# Patient Record
Sex: Female | Born: 1940
Health system: Southern US, Community
[De-identification: ages and names within clinical notes are randomized; demographics above are authoritative.]

## PROBLEM LIST (undated history)

## (undated) DIAGNOSIS — W19XXXA Unspecified fall, initial encounter: Secondary | ICD-10-CM

## (undated) DIAGNOSIS — S0990XA Unspecified injury of head, initial encounter: Secondary | ICD-10-CM

## (undated) DIAGNOSIS — B029 Zoster without complications: Secondary | ICD-10-CM

## (undated) DIAGNOSIS — R296 Repeated falls: Secondary | ICD-10-CM

## (undated) DIAGNOSIS — T148XXA Other injury of unspecified body region, initial encounter: Secondary | ICD-10-CM

## (undated) DIAGNOSIS — S060X9A Concussion with loss of consciousness of unspecified duration, initial encounter: Secondary | ICD-10-CM

## (undated) DIAGNOSIS — B009 Herpesviral infection, unspecified: Secondary | ICD-10-CM

## (undated) DIAGNOSIS — S060XAA Concussion with loss of consciousness status unknown, initial encounter: Secondary | ICD-10-CM

## (undated) DIAGNOSIS — T7840XA Allergy, unspecified, initial encounter: Secondary | ICD-10-CM

## (undated) DIAGNOSIS — G43909 Migraine, unspecified, not intractable, without status migrainosus: Secondary | ICD-10-CM

## (undated) DIAGNOSIS — I10 Essential (primary) hypertension: Secondary | ICD-10-CM

## (undated) HISTORY — DX: Concussion with loss of consciousness status unknown, initial encounter: S06.0XAA

## (undated) HISTORY — PX: CRANIOTOMY FOR EXTRACRANIAL-INTRACRANIAL BYPASS: SUR334

## (undated) HISTORY — DX: Allergy, unspecified, initial encounter: T78.40XA

## (undated) HISTORY — DX: Unspecified fall, initial encounter: W19.XXXA

## (undated) HISTORY — PX: BRAIN SURGERY: SHX531

## (undated) HISTORY — DX: Herpesviral infection, unspecified: B00.9

## (undated) HISTORY — PX: TONSILLECTOMY: SUR1361

## (undated) HISTORY — DX: Concussion with loss of consciousness of unspecified duration, initial encounter: S06.0X9A

## (undated) HISTORY — PX: NERVE SURGERY: SHX1016

## (undated) HISTORY — DX: Zoster without complications: B02.9

## (undated) HISTORY — PX: ROOT CANAL: SHX2363

## (undated) HISTORY — DX: Repeated falls: R29.6

## (undated) HISTORY — PX: HIP SURGERY: SHX245

---

## 2007-02-18 LAB — HM COLONOSCOPY: HM Colonoscopy: NORMAL

## 2010-02-17 LAB — HM DEXA SCAN: HM DEXA SCAN: NORMAL

## 2012-10-06 ENCOUNTER — Emergency Department (HOSPITAL_BASED_OUTPATIENT_CLINIC_OR_DEPARTMENT_OTHER): Payer: Medicare Other

## 2012-10-06 ENCOUNTER — Encounter (HOSPITAL_BASED_OUTPATIENT_CLINIC_OR_DEPARTMENT_OTHER): Payer: Self-pay

## 2012-10-06 ENCOUNTER — Emergency Department (HOSPITAL_BASED_OUTPATIENT_CLINIC_OR_DEPARTMENT_OTHER)
Admission: EM | Admit: 2012-10-06 | Discharge: 2012-10-06 | Disposition: A | Discharge status: Home / Self Care | Payer: Medicare Other | Attending: Emergency Medicine | Admitting: Emergency Medicine

## 2012-10-06 DIAGNOSIS — S0093XA Contusion of unspecified part of head, initial encounter: Secondary | ICD-10-CM

## 2012-10-06 DIAGNOSIS — Z8679 Personal history of other diseases of the circulatory system: Secondary | ICD-10-CM | POA: Insufficient documentation

## 2012-10-06 DIAGNOSIS — Y929 Unspecified place or not applicable: Secondary | ICD-10-CM | POA: Insufficient documentation

## 2012-10-06 DIAGNOSIS — Z79899 Other long term (current) drug therapy: Secondary | ICD-10-CM | POA: Insufficient documentation

## 2012-10-06 DIAGNOSIS — I10 Essential (primary) hypertension: Secondary | ICD-10-CM | POA: Insufficient documentation

## 2012-10-06 DIAGNOSIS — Y9389 Activity, other specified: Secondary | ICD-10-CM | POA: Insufficient documentation

## 2012-10-06 DIAGNOSIS — Z9889 Other specified postprocedural states: Secondary | ICD-10-CM | POA: Insufficient documentation

## 2012-10-06 DIAGNOSIS — S0003XA Contusion of scalp, initial encounter: Secondary | ICD-10-CM | POA: Insufficient documentation

## 2012-10-06 DIAGNOSIS — W1809XA Striking against other object with subsequent fall, initial encounter: Secondary | ICD-10-CM | POA: Insufficient documentation

## 2012-10-06 DIAGNOSIS — Z8669 Personal history of other diseases of the nervous system and sense organs: Secondary | ICD-10-CM | POA: Insufficient documentation

## 2012-10-06 HISTORY — DX: Other injury of unspecified body region, initial encounter: T14.8XXA

## 2012-10-06 HISTORY — DX: Migraine, unspecified, not intractable, without status migrainosus: G43.909

## 2012-10-06 HISTORY — DX: Unspecified injury of head, initial encounter: S09.90XA

## 2012-10-06 HISTORY — DX: Essential (primary) hypertension: I10

## 2012-10-06 NOTE — ED Provider Notes (Signed)
CSN: [id number]     Arrival date & time 10/06/12  1829 History     First MD Initiated Contact with Patient 10/06/12 1912     Chief Complaint  Patient presents with  . Fall  . Headache   (Consider location/radiation/quality/duration/timing/severity/associated sxs/prior Treatment) Patient is a 72 y.o. female presenting with fall. The history is provided by the patient. No language interpreter was used.  Fall This is a new problem. The current episode started today. The problem has been unchanged. Associated symptoms include headaches. Pertinent negatives include no abdominal pain, nausea, neck pain, vomiting or weakness. Nothing aggravates the symptoms. She has tried nothing for the symptoms. The treatment provided no relief.  Pt reports she was helping a friend who has Parkinson's and friend fell knocking her down.  Pt reports she hit the back of her head on concrete.  No loss of consciousness.   Pt reports pain to the back of her head.   Pt has had 2 previous brain surgery's for nerve damage.    Past Medical History  Diagnosis Date  . Head injury   . Nerve damage   . Hypertension   . Migraines    Past Surgical History  Procedure Laterality Date  . Brain surgery    . Nerve surgery    . Hip surgery    . Tonsillectomy     No family history on file. History  Substance Use Topics  . Smoking status: Never Smoker   . Smokeless tobacco: Not on file  . Alcohol Use: Yes     Comment: social   OB History   Grav Para Term Preterm Abortions TAB SAB Ect Mult Living                 Review of Systems  HENT: Negative for neck pain.   Gastrointestinal: Negative for nausea, vomiting and abdominal pain.  Neurological: Positive for headaches. Negative for weakness.  All other systems reviewed and are negative.    Allergies  Review of patient's allergies indicates not on file.  Home Medications   Current Outpatient Rx  Name  Route  Sig  Dispense  Refill  . Nebivolol HCl (BYSTOLIC  PO)   Oral   Take by mouth.         . Valsartan (DIOVAN PO)   Oral   Take by mouth.          BP 173/68  Pulse 55  Temp(Src) 97.5 F (36.4 C) (Oral)  Resp 16  Ht 5\' 3"  (1.6 m)  Wt 178 lb (80.74 kg)  BMI 31.54 kg/m2  SpO2 97% Physical Exam  Nursing note and vitals reviewed. Constitutional: She is oriented to person, place, and time. She appears well-developed and well-nourished.  HENT:  Head: Normocephalic.  Right Ear: External ear normal.  Left Ear: External ear normal.  Nose: Nose normal.  Mouth/Throat: Oropharynx is clear and moist.  Eyes: Conjunctivae and EOM are normal. Pupils are equal, round, and reactive to light.  Neck: Normal range of motion. Neck supple.  Cardiovascular: Normal rate.   Pulmonary/Chest: Effort normal.  Abdominal: Soft.  Musculoskeletal: Normal range of motion.  Neurological: She is alert and oriented to person, place, and time. She has normal reflexes.  Skin: Skin is warm.  Psychiatric: She has a normal mood and affect.    ED Course   Procedures (including critical care time)  Labs Reviewed - No data to display No results found. 1. Contusion of head, initial encounter  MDM  Ct no acute abnormality,   Dr. Rosalia Hammers in to see and examine  Elson Areas, PA-C 10/06/12 2145

## 2012-10-06 NOTE — ED Notes (Signed)
Patient transported to CT 

## 2012-10-06 NOTE — ED Notes (Signed)
Pt reports she was standing behind her friend, the friend lost her balance falling on pt.  Pt states she fell onto a hard surface striking head on floor.  Denies LOC or neck pain.  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

## 2012-10-06 NOTE — ED Notes (Signed)
Pt reports she fell striking head on floor.  Denies LOC.

## 2012-10-07 NOTE — ED Provider Notes (Signed)
72 y.o. Female who fellthis evening and struck her head.  She did not have an loc and is not on coumadin or plavix or other blood thinner.  She has a normal neuro exam.   I performed a history and physical examination of Angela Gibbs and discussed her management with [name].  I agree with the history, physical, assessment, and plan of care, with the following exceptions: None  I was present for the following procedures: None Time Spent in Critical Care of the patient: None Time spent in discussions with the patient and family: 7  Angela Gibbs S    Hilario Quarry, MD 10/07/12 1445

## 2013-04-16 ENCOUNTER — Encounter (HOSPITAL_BASED_OUTPATIENT_CLINIC_OR_DEPARTMENT_OTHER): Payer: Self-pay | Admitting: Emergency Medicine

## 2013-04-16 ENCOUNTER — Emergency Department (HOSPITAL_BASED_OUTPATIENT_CLINIC_OR_DEPARTMENT_OTHER)
Admission: EM | Admit: 2013-04-16 | Discharge: 2013-04-16 | Disposition: A | Discharge status: Home / Self Care | Payer: Medicare Other | Attending: Emergency Medicine | Admitting: Emergency Medicine

## 2013-04-16 ENCOUNTER — Emergency Department (HOSPITAL_BASED_OUTPATIENT_CLINIC_OR_DEPARTMENT_OTHER): Payer: Medicare Other

## 2013-04-16 DIAGNOSIS — S39012A Strain of muscle, fascia and tendon of lower back, initial encounter: Secondary | ICD-10-CM

## 2013-04-16 DIAGNOSIS — Y929 Unspecified place or not applicable: Secondary | ICD-10-CM | POA: Insufficient documentation

## 2013-04-16 DIAGNOSIS — I10 Essential (primary) hypertension: Secondary | ICD-10-CM | POA: Insufficient documentation

## 2013-04-16 DIAGNOSIS — S335XXA Sprain of ligaments of lumbar spine, initial encounter: Secondary | ICD-10-CM | POA: Insufficient documentation

## 2013-04-16 DIAGNOSIS — T148XXA Other injury of unspecified body region, initial encounter: Secondary | ICD-10-CM

## 2013-04-16 DIAGNOSIS — S20229A Contusion of unspecified back wall of thorax, initial encounter: Secondary | ICD-10-CM | POA: Insufficient documentation

## 2013-04-16 DIAGNOSIS — Z87828 Personal history of other (healed) physical injury and trauma: Secondary | ICD-10-CM | POA: Insufficient documentation

## 2013-04-16 DIAGNOSIS — G43909 Migraine, unspecified, not intractable, without status migrainosus: Secondary | ICD-10-CM | POA: Insufficient documentation

## 2013-04-16 DIAGNOSIS — W108XXA Fall (on) (from) other stairs and steps, initial encounter: Secondary | ICD-10-CM | POA: Insufficient documentation

## 2013-04-16 DIAGNOSIS — Z880 Allergy status to penicillin: Secondary | ICD-10-CM | POA: Insufficient documentation

## 2013-04-16 DIAGNOSIS — Y9389 Activity, other specified: Secondary | ICD-10-CM | POA: Insufficient documentation

## 2013-04-16 DIAGNOSIS — Z79899 Other long term (current) drug therapy: Secondary | ICD-10-CM | POA: Insufficient documentation

## 2013-04-16 MED ORDER — PROMETHAZINE HCL 25 MG PO TABS: 25.0000 mg | ORAL_TABLET | Freq: Four times a day (QID) | ORAL | Status: DC | PRN

## 2013-04-16 MED ORDER — DIAZEPAM 5 MG PO TABS: 5.0000 mg | ORAL_TABLET | Freq: Two times a day (BID) | ORAL | Status: DC | PRN

## 2013-04-16 MED ORDER — HYDROMORPHONE HCL PF 1 MG/ML IJ SOLN
0.5000 mg | Freq: Once | INTRAMUSCULAR | Status: AC
  Administered 2013-04-16: 0.5 mg via INTRAMUSCULAR
  Filled 2013-04-16: qty 1

## 2013-04-16 MED ORDER — OXYCODONE-ACETAMINOPHEN 5-325 MG PO TABS: 1.0000 | ORAL_TABLET | ORAL | Status: DC | PRN

## 2013-04-16 MED ORDER — ONDANSETRON 8 MG PO TBDP
8.0000 mg | ORAL_TABLET | Freq: Once | ORAL | Status: AC
  Administered 2013-04-16: 8 mg via ORAL
  Filled 2013-04-16: qty 1

## 2013-04-16 NOTE — ED Provider Notes (Signed)
CSN: [id number]     Arrival date & time 04/16/13  1244 History   First MD Initiated Contact with Patient 04/16/13 1247     Chief Complaint  Patient presents with  . Back Pain     (Consider location/radiation/quality/duration/timing/severity/associated sxs/prior Treatment) HPI Comments: Patient presents to the ER for evaluation of back pain. Patient reports that she fell walking down a flight of steps 2 days ago. Patient reports that she slid on her back, hitting multiple steps on the way down. No head injury or loss of consciousness. Patient has had progressively worsening pain on the left side of her mid and low back since the fall. She denies extremity injury.  Patient is a 73 y.o. female presenting with back pain.  Back Pain   Past Medical History  Diagnosis Date  . Head injury   . Nerve damage   . Hypertension   . Migraines    Past Surgical History  Procedure Laterality Date  . Brain surgery    . Nerve surgery    . Hip surgery      intracranial surgery  . Tonsillectomy    . Craniotomy for extracranial-intracranial bypass     No family history on file. History  Substance Use Topics  . Smoking status: Never Smoker   . Smokeless tobacco: Not on file  . Alcohol Use: Yes     Comment: social   OB History   Grav Para Term Preterm Abortions TAB SAB Ect Mult Living                 Review of Systems  Musculoskeletal: Positive for back pain.  All other systems reviewed and are negative.      Allergies  Penicillins  Home Medications   Current Outpatient Rx  Name  Route  Sig  Dispense  Refill  . butalbital-acetaminophen-caffeine (FIORICET, ESGIC) 50-325-40 MG per tablet   Oral   Take by mouth 2 (two) times daily as needed for headache.         . Nebivolol HCl (BYSTOLIC PO)   Oral   Take by mouth.         . nystatin 100000 UNITS vaginal tablet   Vaginal   Place 1 tablet vaginally at bedtime.         . Valsartan (DIOVAN PO)   Oral   Take by  mouth.          BP 156/67  Pulse 55  Temp(Src) 97.7 F (36.5 C) (Oral)  Resp 16  Ht 5\' 3"  (1.6 m)  Wt 180 lb (81.647 kg)  BMI 31.89 kg/m2  SpO2 99% Physical Exam  Constitutional: She is oriented to person, place, and time. She appears well-developed and well-nourished. No distress.  HENT:  Head: Normocephalic and atraumatic.  Right Ear: Hearing normal.  Left Ear: Hearing normal.  Nose: Nose normal.  Mouth/Throat: Oropharynx is clear and moist and mucous membranes are normal.  Eyes: Conjunctivae and EOM are normal. Pupils are equal, round, and reactive to light.  Neck: Normal range of motion. Neck supple.  Cardiovascular: Regular rhythm, S1 normal and S2 normal.  Exam reveals no gallop and no friction rub.   No murmur heard. Pulmonary/Chest: Effort normal and breath sounds normal. No respiratory distress. She exhibits no tenderness.  Abdominal: Soft. Normal appearance and bowel sounds are normal. There is no hepatosplenomegaly. There is no tenderness. There is no rebound, no guarding, no tenderness at McBurney's point and negative Murphy's sign. No hernia.  Musculoskeletal: Normal range  of motion.       Lumbar back: She exhibits tenderness. She exhibits no bony tenderness.       Back:  Neurological: She is alert and oriented to person, place, and time. She has normal strength. No cranial nerve deficit or sensory deficit. Coordination normal. GCS eye subscore is 4. GCS verbal subscore is 5. GCS motor subscore is 6.  Skin: Skin is warm, dry and intact. No rash noted. No cyanosis.  Psychiatric: She has a normal mood and affect. Her speech is normal and behavior is normal. Thought content normal.    ED Course  Procedures (including critical care time) Labs Review Labs Reviewed - No data to display Imaging Review Dg Ribs Unilateral W/chest Left  04/16/2013   CLINICAL DATA:  Pain post fall  EXAM: LEFT RIBS AND CHEST - 3+ VIEW  COMPARISON:  02/04/2011  FINDINGS: Lungs are clear.  Heart size upper limits normal.  No effusion. Thoracolumbar scoliosis, apex to right T8-9, to the left L1-2, without evident underlying vertebral anomaly. No pneumothorax. No displaced rib fracture on detail views.  IMPRESSION: 1. Negative for displaced rib fracture, pneumothorax or pleural effusion. 2. Thoracolumbar scoliosis.   Electronically Signed   By: Arne Cleveland M.D.   On: 04/16/2013 13:53   Dg Lumbar Spine Complete  04/16/2013   CLINICAL DATA:  Pain post fall  EXAM: LUMBAR SPINE - COMPLETE 4+ VIEW  COMPARISON:  None.  FINDINGS: Thoracolumbar scoliosis, apex to the left at L2. Spondylitic changes L1- S1. No definite fracture or dislocation. Right hip prosthesis partially visualized.  IMPRESSION: 1. Negative for fracture or other acute bone abnormality. 2. Thoracolumbar scoliosis with multilevel degenerative changes.   Electronically Signed   By: Arne Cleveland M.D.   On: 04/16/2013 13:55   Dg Pelvis 1-2 Views  04/16/2013   CLINICAL DATA:  Pain post fall  EXAM: PELVIS - 1-2 VIEW  COMPARISON:  None.  FINDINGS: Right hip arthroplasty components, and distal aspect of stem not visualized. No fracture or dislocation. Spurring from the superior margin left acetabulum. Lumbar levoscoliosis with multilevel degenerative changes. Osteitis pubis.  IMPRESSION: 1. Negative for fracture or other acute bone abnormality. 2. Postoperative and degenerative changes as above.   Electronically Signed   By: Arne Cleveland M.D.   On: 04/16/2013 13:56     EKG Interpretation None      MDM   Final diagnoses:  None    Patient presents with complaints of back pain. Pain is on the left side of the lower back. Tenderness is from the posterior iliac crest to the lower posterior ribs, encompassing most of the soft tissue and paraspinal muscles on the left side. No specific midline tenderness. X-ray of the ribs, lumbar spine and pelvis were obtained because of the areas of tenderness. No acute bony abnormalities are  seen. Patient will require rest and analgesia.    Orpah Greek, MD 04/16/13 [phone]

## 2013-04-16 NOTE — Discharge Instructions (Signed)
Back Pain, Adult Low back pain is very common. About 1 in 5 people have back pain.The cause of low back pain is rarely dangerous. The pain often gets better over time.About half of people with a sudden onset of back pain feel better in just 2 weeks. About 8 in 10 people feel better by 6 weeks.  CAUSES Some common causes of back pain include:  Strain of the muscles or ligaments supporting the spine.  Wear and tear (degeneration) of the spinal discs.  Arthritis.  Direct injury to the back. DIAGNOSIS Most of the time, the direct cause of low back pain is not known.However, back pain can be treated effectively even when the exact cause of the pain is unknown.Answering your caregiver's questions about your overall health and symptoms is one of the most accurate ways to make sure the cause of your pain is not dangerous. If your caregiver needs more information, he or she may order lab work or imaging tests (X-rays or MRIs).However, even if imaging tests show changes in your back, this usually does not require surgery. HOME CARE INSTRUCTIONS For many people, back pain returns.Since low back pain is rarely dangerous, it is often a condition that people can learn to manageon their own.   Remain active. It is stressful on the back to sit or stand in one place. Do not sit, drive, or stand in one place for more than 30 minutes at a time. Take short walks on level surfaces as soon as pain allows.Try to increase the length of time you walk each day.  Do not stay in bed.Resting more than 1 or 2 days can delay your recovery.  Do not avoid exercise or work.Your body is made to move.It is not dangerous to be active, even though your back may hurt.Your back will likely heal faster if you return to being active before your pain is gone.  Pay attention to your body when you bend and lift. Many people have less discomfortwhen lifting if they bend their knees, keep the load close to their bodies,and  avoid twisting. Often, the most comfortable positions are those that put less stress on your recovering back.  Find a comfortable position to sleep. Use a firm mattress and lie on your side with your knees slightly bent. If you lie on your back, put a pillow under your knees.  Only take over-the-counter or prescription medicines as directed by your caregiver. Over-the-counter medicines to reduce pain and inflammation are often the most helpful.Your caregiver may prescribe muscle relaxant drugs.These medicines help dull your pain so you can more quickly return to your normal activities and healthy exercise.  Put ice on the injured area.  Put ice in a plastic bag.  Place a towel between your skin and the bag.  Leave the ice on for 15-20 minutes, 03-04 times a day for the first 2 to 3 days. After that, ice and heat may be alternated to reduce pain and spasms.  Ask your caregiver about trying back exercises and gentle massage. This may be of some benefit.  Avoid feeling anxious or stressed.Stress increases muscle tension and can worsen back pain.It is important to recognize when you are anxious or stressed and learn ways to manage it.Exercise is a great option. SEEK MEDICAL CARE IF:  You have pain that is not relieved with rest or medicine.  You have pain that does not improve in 1 week.  You have new symptoms.  You are generally not feeling well. SEEK   IMMEDIATE MEDICAL CARE IF:   You have pain that radiates from your back into your legs.  You develop new bowel or bladder control problems.  You have unusual weakness or numbness in your arms or legs.  You develop nausea or vomiting.  You develop abdominal pain.  You feel faint. Document Released: 02/03/2005 Document Revised: 08/05/2011 Document Reviewed: 06/24/2010 [employer] Patient Information 2014 [employer].  

## 2013-04-16 NOTE — ED Notes (Signed)
Fall down steps 48 hrs ago.  C/o lower left back pain since fall

## 2013-04-16 NOTE — ED Notes (Signed)
MD at bedside. 

## 2013-04-28 ENCOUNTER — Ambulatory Visit: Payer: Medicare Other | Attending: Orthopaedic Surgery | Admitting: Physical Therapy

## 2013-04-28 DIAGNOSIS — M545 Low back pain, unspecified: Secondary | ICD-10-CM | POA: Insufficient documentation

## 2013-04-28 DIAGNOSIS — IMO0001 Reserved for inherently not codable concepts without codable children: Secondary | ICD-10-CM | POA: Insufficient documentation

## 2013-05-02 ENCOUNTER — Ambulatory Visit: Payer: Medicare Other | Admitting: Rehabilitation

## 2013-05-04 ENCOUNTER — Ambulatory Visit: Payer: Medicare Other | Admitting: Rehabilitation

## 2013-05-09 ENCOUNTER — Ambulatory Visit: Payer: Medicare Other | Admitting: Physical Therapy

## 2013-05-12 ENCOUNTER — Ambulatory Visit: Payer: Medicare Other | Admitting: Physical Therapy

## 2013-05-16 ENCOUNTER — Ambulatory Visit: Payer: Medicare Other | Admitting: Physical Therapy

## 2013-05-17 ENCOUNTER — Ambulatory Visit: Payer: Medicare Other | Admitting: Physical Therapy

## 2013-05-26 ENCOUNTER — Ambulatory Visit: Payer: Medicare Other | Admitting: Physical Therapy

## 2013-05-30 ENCOUNTER — Ambulatory Visit: Payer: Medicare Other | Attending: Orthopaedic Surgery | Admitting: Rehabilitation

## 2013-05-30 DIAGNOSIS — M545 Low back pain, unspecified: Secondary | ICD-10-CM | POA: Insufficient documentation

## 2013-05-30 DIAGNOSIS — IMO0001 Reserved for inherently not codable concepts without codable children: Secondary | ICD-10-CM | POA: Insufficient documentation

## 2013-06-01 ENCOUNTER — Ambulatory Visit: Payer: Medicare Other | Admitting: Physical Therapy

## 2013-06-06 DIAGNOSIS — H269 Unspecified cataract: Secondary | ICD-10-CM

## 2013-06-06 DIAGNOSIS — H029 Unspecified disorder of eyelid: Secondary | ICD-10-CM | POA: Insufficient documentation

## 2013-06-06 HISTORY — DX: Unspecified disorder of eyelid: H02.9

## 2013-06-06 HISTORY — DX: Unspecified cataract: H26.9

## 2013-06-08 ENCOUNTER — Ambulatory Visit: Payer: Medicare Other | Admitting: Rehabilitation

## 2013-06-13 ENCOUNTER — Encounter (HOSPITAL_BASED_OUTPATIENT_CLINIC_OR_DEPARTMENT_OTHER): Payer: Self-pay | Admitting: Emergency Medicine

## 2013-06-13 ENCOUNTER — Emergency Department (HOSPITAL_BASED_OUTPATIENT_CLINIC_OR_DEPARTMENT_OTHER)
Admission: EM | Admit: 2013-06-13 | Discharge: 2013-06-13 | Disposition: A | Discharge status: Home / Self Care | Payer: Medicare Other | Attending: Emergency Medicine | Admitting: Emergency Medicine

## 2013-06-13 DIAGNOSIS — G43909 Migraine, unspecified, not intractable, without status migrainosus: Secondary | ICD-10-CM | POA: Insufficient documentation

## 2013-06-13 DIAGNOSIS — Z79899 Other long term (current) drug therapy: Secondary | ICD-10-CM | POA: Insufficient documentation

## 2013-06-13 DIAGNOSIS — Z880 Allergy status to penicillin: Secondary | ICD-10-CM | POA: Insufficient documentation

## 2013-06-13 DIAGNOSIS — Z87828 Personal history of other (healed) physical injury and trauma: Secondary | ICD-10-CM | POA: Insufficient documentation

## 2013-06-13 DIAGNOSIS — N390 Urinary tract infection, site not specified: Secondary | ICD-10-CM | POA: Insufficient documentation

## 2013-06-13 DIAGNOSIS — I10 Essential (primary) hypertension: Secondary | ICD-10-CM | POA: Insufficient documentation

## 2013-06-13 LAB — URINE MICROSCOPIC-ADD ON

## 2013-06-13 LAB — URINALYSIS, ROUTINE W REFLEX MICROSCOPIC
BILIRUBIN URINE: NEGATIVE
GLUCOSE, UA: NEGATIVE mg/dL
KETONES UR: NEGATIVE mg/dL
Nitrite: POSITIVE — AB
PH: 6 (ref 5.0–8.0)
PROTEIN: NEGATIVE mg/dL
Specific Gravity, Urine: 1.017 (ref 1.005–1.030)
Urobilinogen, UA: 0.2 mg/dL (ref 0.0–1.0)

## 2013-06-13 MED ORDER — CEPHALEXIN 500 MG PO CAPS: 500.0000 mg | ORAL_CAPSULE | Freq: Four times a day (QID) | ORAL | Status: DC

## 2013-06-13 MED ORDER — PHENAZOPYRIDINE HCL 200 MG PO TABS: 200.0000 mg | ORAL_TABLET | Freq: Three times a day (TID) | ORAL | Status: DC | PRN

## 2013-06-13 NOTE — ED Notes (Signed)
Possible UTI. Urinary frequency and cloudiness.

## 2013-06-13 NOTE — ED Provider Notes (Signed)
CSN: [id number]     Arrival date & time 06/13/13  1715 History  This chart was scribed for Orpah Greek, MD by Celesta Gentile, ED Scribe. The patient was seen in room MHT13/MHT13. Patient's care was started at 6:37 PM.  Chief Complaint  Patient presents with  . Urinary Tract Infection   The history is provided by the patient. No language interpreter was used.   HPI Comments: Angela Gibbs is a 73 y.o. female who presents to the Emergency Department complaining of a urinary tract infection.  Pt has associated dysuria, urinary frequency, and cloudy urine.  Pt states when she urinates it feels very heavy and uncomfortable.  Pt states her last UTI was about 1 year ago.  Pt denies fevers, chills, nausea, and emesis.    Past Medical History  Diagnosis Date  . Head injury   . Nerve damage   . Hypertension   . Migraines    Past Surgical History  Procedure Laterality Date  . Brain surgery    . Nerve surgery    . Hip surgery      intracranial surgery  . Tonsillectomy    . Craniotomy for extracranial-intracranial bypass     No family history on file. History  Substance Use Topics  . Smoking status: Never Smoker   . Smokeless tobacco: Not on file  . Alcohol Use: Yes     Comment: social   OB History   Grav Para Term Preterm Abortions TAB SAB Ect Mult Living                 Review of Systems  Constitutional: Negative for fever and chills.  Gastrointestinal: Negative for nausea, vomiting, abdominal pain and diarrhea.  Genitourinary: Positive for dysuria and frequency. Negative for flank pain and decreased urine volume.  Skin: Negative for color change and rash.  Psychiatric/Behavioral: Negative for behavioral problems and confusion.  All other systems reviewed and are negative.   Allergies  Penicillins  Home Medications   Prior to Admission medications   Medication Sig Start Date End Date Taking? Authorizing Provider  butalbital-acetaminophen-caffeine (FIORICET,  ESGIC) 50-325-40 MG per tablet Take by mouth 2 (two) times daily as needed for headache.    Historical Provider, MD  diazepam (VALIUM) 5 MG tablet Take 1 tablet (5 mg total) by mouth every 12 (twelve) hours as needed for muscle spasms. 04/16/13   Orpah Greek, MD  Nebivolol HCl (BYSTOLIC PO) Take by mouth.    Historical Provider, MD  nystatin 100000 UNITS vaginal tablet Place 1 tablet vaginally at bedtime.    Historical Provider, MD  oxyCODONE-acetaminophen (PERCOCET) 5-325 MG per tablet Take 1 tablet by mouth every 4 (four) hours as needed. 04/16/13   Orpah Greek, MD  promethazine (PHENERGAN) 25 MG tablet Take 1 tablet (25 mg total) by mouth every 6 (six) hours as needed for nausea or vomiting. 04/16/13   Orpah Greek, MD  Valsartan (DIOVAN PO) Take by mouth.    Historical Provider, MD   Triage Vitals: BP 161/61  Pulse 58  Temp(Src) 98.9 F (37.2 C) (Oral)  Resp 18  Ht 5' 3.5" (1.613 m)  Wt 185 lb (83.915 kg)  BMI 32.25 kg/m2  SpO2 100%  Physical Exam  Nursing note and vitals reviewed. Constitutional: She is oriented to person, place, and time. She appears well-developed and well-nourished. No distress.  HENT:  Head: Normocephalic and atraumatic.  Eyes: EOM are normal.  Cardiovascular: Normal rate, regular rhythm and normal heart sounds.  Exam reveals no gallop and no friction rub.   No murmur heard. Pulmonary/Chest: Effort normal and breath sounds normal. No respiratory distress. She has no wheezes. She has no rales.  Abdominal: Soft. She exhibits no distension. There is no tenderness.  Musculoskeletal: Normal range of motion.  Neurological: She is alert and oriented to person, place, and time.  Skin: Skin is warm and dry. No rash noted.  Psychiatric: She has a normal mood and affect. Her behavior is normal.    ED Course  Procedures (including critical care time) DIAGNOSTIC STUDIES: Oxygen Saturation is 100% on RA, normal by my interpretation.     COORDINATION OF CARE: 6:39 PM-Informed pt her urine showed a UTI.  Will discharge with Keflex and Pyridium.   Patient informed of current plan of treatment and evaluation and agrees with plan.     Results for orders placed during the hospital encounter of 06/13/13  URINALYSIS, ROUTINE W REFLEX MICROSCOPIC      Result Value Ref Range   Color, Urine YELLOW  YELLOW   APPearance CLOUDY (*) CLEAR   Specific Gravity, Urine 1.017  1.005 - 1.030   pH 6.0  5.0 - 8.0   Glucose, UA NEGATIVE  NEGATIVE mg/dL   Hgb urine dipstick SMALL (*) NEGATIVE   Bilirubin Urine NEGATIVE  NEGATIVE   Ketones, ur NEGATIVE  NEGATIVE mg/dL   Protein, ur NEGATIVE  NEGATIVE mg/dL   Urobilinogen, UA 0.2  0.0 - 1.0 mg/dL   Nitrite POSITIVE (*) NEGATIVE   Leukocytes, UA LARGE (*) NEGATIVE  URINE MICROSCOPIC-ADD ON      Result Value Ref Range   Squamous Epithelial / LPF RARE  RARE   WBC, UA TOO NUMEROUS TO COUNT  <3 WBC/hpf   RBC / HPF 3-6  <3 RBC/hpf   Bacteria, UA MANY (*) RARE   Urine-Other MUCOUS PRESENT       MDM   Final diagnoses:  UTI (lower urinary tract infection)   Presents to ER for evaluation of urinary frequency and dysuria. Urinalysis shows obvious signs of urinary tract infection. She appears well, vital signs are stable. No concern for bacteremia or pyelonephritis. Initiate outpatient therapy, followup with primary doctor.  I personally performed the services described in this documentation, which was scribed in my presence. The recorded information has been reviewed and is accurate.       Orpah Greek, MD 06/13/13 [phone]

## 2013-06-13 NOTE — Discharge Instructions (Signed)
Urinary Tract Infection  Urinary tract infections (UTIs) can develop anywhere along your urinary tract. Your urinary tract is your body's drainage system for removing wastes and extra water. Your urinary tract includes two kidneys, two ureters, a bladder, and a urethra. Your kidneys are a pair of bean-shaped organs. Each kidney is about the size of your fist. They are located below your ribs, one on each side of your spine.  CAUSES  Infections are caused by microbes, which are microscopic organisms, including fungi, viruses, and bacteria. These organisms are so small that they can only be seen through a microscope. Bacteria are the microbes that most commonly cause UTIs.  SYMPTOMS   Symptoms of UTIs may vary by age and gender of the patient and by the location of the infection. Symptoms in young women typically include a frequent and intense urge to urinate and a painful, burning feeling in the bladder or urethra during urination. Older women and men are more likely to be tired, shaky, and weak and have muscle aches and abdominal pain. A fever may mean the infection is in your kidneys. Other symptoms of a kidney infection include pain in your back or sides below the ribs, nausea, and vomiting.  DIAGNOSIS  To diagnose a UTI, your caregiver will ask you about your symptoms. Your caregiver also will ask to provide a urine sample. The urine sample will be tested for bacteria and Billiot blood cells. Mirelez blood cells are made by your body to help fight infection.  TREATMENT   Typically, UTIs can be treated with medication. Because most UTIs are caused by a bacterial infection, they usually can be treated with the use of antibiotics. The choice of antibiotic and length of treatment depend on your symptoms and the type of bacteria causing your infection.  HOME CARE INSTRUCTIONS   If you were prescribed antibiotics, take them exactly as your caregiver instructs you. Finish the medication even if you feel better after you  have only taken some of the medication.   Drink enough water and fluids to keep your urine clear or pale yellow.   Avoid caffeine, tea, and carbonated beverages. They tend to irritate your bladder.   Empty your bladder often. Avoid holding urine for long periods of time.   Empty your bladder before and after sexual intercourse.   After a bowel movement, women should cleanse from front to back. Use each tissue only once.  SEEK MEDICAL CARE IF:    You have back pain.   You develop a fever.   Your symptoms do not begin to resolve within 3 days.  SEEK IMMEDIATE MEDICAL CARE IF:    You have severe back pain or lower abdominal pain.   You develop chills.   You have nausea or vomiting.   You have continued burning or discomfort with urination.  MAKE SURE YOU:    Understand these instructions.   Will watch your condition.   Will get help right away if you are not doing well or get worse.  Document Released: 11/13/2004 Document Revised: 08/05/2011 Document Reviewed: 03/14/2011  [employer] Patient Information 2014 [employer].

## 2013-06-15 ENCOUNTER — Telehealth (HOSPITAL_BASED_OUTPATIENT_CLINIC_OR_DEPARTMENT_OTHER): Payer: Self-pay

## 2013-06-15 ENCOUNTER — Ambulatory Visit: Payer: Medicare Other | Admitting: Rehabilitation

## 2013-07-05 ENCOUNTER — Ambulatory Visit: Payer: Medicare Other | Attending: Orthopaedic Surgery | Admitting: Rehabilitation

## 2013-07-05 DIAGNOSIS — IMO0001 Reserved for inherently not codable concepts without codable children: Secondary | ICD-10-CM | POA: Insufficient documentation

## 2013-07-05 DIAGNOSIS — M545 Low back pain, unspecified: Secondary | ICD-10-CM | POA: Insufficient documentation

## 2013-07-06 ENCOUNTER — Ambulatory Visit: Payer: Medicare Other | Admitting: Rehabilitation

## 2013-07-13 ENCOUNTER — Ambulatory Visit: Payer: Medicare Other | Admitting: Rehabilitation

## 2013-07-14 ENCOUNTER — Ambulatory Visit: Payer: Medicare Other | Admitting: Rehabilitation

## 2013-07-18 ENCOUNTER — Ambulatory Visit: Payer: Medicare Other | Attending: Orthopaedic Surgery | Admitting: Rehabilitation

## 2013-07-18 DIAGNOSIS — IMO0001 Reserved for inherently not codable concepts without codable children: Secondary | ICD-10-CM | POA: Insufficient documentation

## 2013-07-18 DIAGNOSIS — M545 Low back pain, unspecified: Secondary | ICD-10-CM | POA: Insufficient documentation

## 2013-07-21 ENCOUNTER — Ambulatory Visit: Payer: Medicare Other | Admitting: Rehabilitation

## 2013-07-25 ENCOUNTER — Ambulatory Visit: Payer: Medicare Other | Admitting: Rehabilitation

## 2013-07-27 ENCOUNTER — Telehealth (INDEPENDENT_AMBULATORY_CARE_PROVIDER_SITE_OTHER): Payer: Self-pay

## 2013-07-27 NOTE — Telephone Encounter (Signed)
LMOM. Received a letter from pt re: upcoming appt and records. We cannot send a request for records that has not been signed by the pt. We have not seen the pt yet and are not able to request outside records without a signed release.  If the patient wants [place] to request records she will have to come to office and sign a release or she will have to call the hospital and ask them to fax her a release to be signed an faxed back to them.

## 2013-07-27 NOTE — Telephone Encounter (Signed)
Pt calling to report that she tried to get the records from the hospital in [facility], but was unsuccessful.  She will keep her appointment with [place] next week.  An attorney has been retained and will be getting the records for the patient.

## 2013-07-28 ENCOUNTER — Ambulatory Visit: Payer: Medicare Other | Admitting: Rehabilitation

## 2013-07-29 ENCOUNTER — Ambulatory Visit (INDEPENDENT_AMBULATORY_CARE_PROVIDER_SITE_OTHER): Payer: Medicare Other | Admitting: Surgery

## 2013-07-29 ENCOUNTER — Encounter (INDEPENDENT_AMBULATORY_CARE_PROVIDER_SITE_OTHER): Payer: Self-pay | Admitting: Surgery

## 2013-07-29 VITALS — BP 136/78 | HR 74 | Temp 97.8°F | Ht 63.0 in | Wt 181.0 lb

## 2013-07-29 DIAGNOSIS — R1011 Right upper quadrant pain: Secondary | ICD-10-CM | POA: Insufficient documentation

## 2013-07-29 DIAGNOSIS — G8929 Other chronic pain: Secondary | ICD-10-CM

## 2013-07-29 HISTORY — DX: Right upper quadrant pain: R10.11

## 2013-07-29 NOTE — Progress Notes (Signed)
[facility]  Chief Complaint  Patient presents with  . New Evaluation    evaluate for gallbladder disease - patient is self-referred    HISTORY: Patient is a 73 year old female who presents for evaluation of right upper quadrant abdominal pain radiating to the back. Patient had an episode of left-sided chest pain approximately one year ago. She was evaluated for cardiac disease and her workup was essentially negative. This included an abdominal ultrasound on 08/14/2012 which was a normal study with no evidence of cholelithiasis.  Patient developed right upper quadrant abdominal pain on 07/19/2013. Patient describes this as severe and radiating to the back between the scapulae. It was associated with nausea and emesis. Patient has experienced chills and may have had fever for 2 days. She was seen by her primary care physician and underwent abdominal ultrasound and laboratory studies. Pain persisted until 07/27/2013. She has been pain-free now for 3 days. Patient is referred for further evaluation and recommendations.  Ultrasound examination on 07/21/2013 at [facility] shows cholelithiasis without evidence of acute cholecystitis.  Patient has had no prior abdominal surgery.  Past Medical History  Diagnosis Date  . Head injury   . Nerve damage   . Hypertension   . Migraines     Current Outpatient Prescriptions  Medication Sig Dispense Refill  . butalbital-acetaminophen-caffeine (FIORICET, ESGIC) 50-325-40 MG per tablet Take by mouth 2 (two) times daily as needed for headache.      . cephALEXin (KEFLEX) 500 MG capsule Take 1 capsule (500 mg total) by mouth 4 (four) times daily.  28 capsule  0  . Cholecalciferol (D 5000) 5000 UNITS TABS daily at 0600.      . diazepam (VALIUM) 5 MG tablet Take 1 tablet (5 mg total) by mouth every 12 (twelve) hours as needed for muscle spasms.  10 tablet  0  . Nebivolol HCl (BYSTOLIC PO) Take by mouth.      .  nystatin 100000 UNITS vaginal tablet Place 1 tablet vaginally at bedtime.      Marland Kitchen omeprazole (PRILOSEC OTC) 20 MG tablet 2 mg.      . phenazopyridine (PYRIDIUM) 200 MG tablet Take 1 tablet (200 mg total) by mouth 3 (three) times daily as needed for pain.  6 tablet  0  . promethazine (PHENERGAN) 25 MG tablet Take 1 tablet (25 mg total) by mouth every 6 (six) hours as needed for nausea or vomiting.  30 tablet  0  . Valsartan (DIOVAN PO) Take by mouth.      . valsartan (DIOVAN) 80 MG tablet        No current facility-administered medications for this visit.    Allergies  Allergen Reactions  . Erythromycin Hives  . Penicillins   . Sulfa Antibiotics Hives    Family History  Problem Relation Age of Onset  . Heart disease Father     History   Social History  . Marital Status: Married    Spouse Name: N/A    Number of Children: N/A  . Years of Education: N/A   Social History Main Topics  . Smoking status: Never Smoker   . Smokeless tobacco: None  . Alcohol Use: Yes     Comment: social  . Drug Use: No  . Sexual Activity: None   Other Topics Concern  . None   Social History Narrative  . None    REVIEW OF SYSTEMS - PERTINENT POSITIVES ONLY: Denies jaundice. Loose bowel movements, no acholic stools. No  hepatobiliary or pancreatic disease.  EXAM: Filed Vitals:   07/29/13 1425  BP: 136/78  Pulse: 74  Temp: 97.8 F (36.6 C)    GENERAL: well-developed, well-nourished, no acute distress HEENT: normocephalic; pupils equal and reactive; sclerae clear; dentition good; mucous membranes moist NECK:  No palpable masses in the thyroid bed; symmetric on extension; no palpable anterior or posterior cervical lymphadenopathy; no supraclavicular masses; no tenderness CHEST: clear to auscultation bilaterally without rales, rhonchi, or wheezes CARDIAC: regular rate and rhythm without significant murmur; peripheral pulses are full ABDOMEN: soft without distension; bowel sounds present; no  mass; no hepatosplenomegaly; no hernia; no tenderness EXT:  non-tender without edema; no deformity NEURO: no gross focal deficits; no sign of tremor   LABORATORY RESULTS: See [facility] ([facility]) for most recent results  RADIOLOGY RESULTS: See [facility] ([facility]L-Epic) for most recent results  IMPRESSION: Symptomatic cholelithiasis, chronic cholecystitis  PLAN: I discussed the above findings at length with the patient and her husband. We reviewed her ultrasound report. We reviewed the actual images and I returned those images to the patient.  I have recommended laparoscopic cholecystectomy with intraoperative cholangiography. We discussed the risk and benefits of the procedure including the risk of bleeding, infection, need for conversion to open surgery, and injury to common bile duct. We discussed the hospital stay to be anticipated and her postoperative recovery. They understand and agree to proceed in the near future.  The risks and benefits of the procedure have been discussed at length with the patient.  The patient understands the proposed procedure, potential alternative treatments, and the course of recovery to be expected.  All of the patient's questions have been answered at this time.  The patient wishes to proceed with surgery.  Earnstine Regal, MD, [facility]  Primary Care Physician: Idamae Schuller, MD

## 2013-07-29 NOTE — Patient Instructions (Signed)
  [facility]  LAPAROSCOPIC SURGERY - POST-OP INSTRUCTIONS  Always review your discharge instruction sheet given to you by the facility where your surgery was performed.  A prescription for pain medication may be given to you upon discharge.  Take your pain medication as prescribed.  If narcotic pain medicine is not needed, then you may take acetaminophen (Tylenol) or ibuprofen (Advil) as needed.  Take your usually prescribed medications unless otherwise directed.  If you need a refill on your pain medication, please contact your pharmacy.  They will contact our office to request authorization. Prescriptions will not be filled after 5 P.M. or on weekends.  You should follow a light diet the first few days after arrival home, such as soup and crackers or toast.  Be sure to include plenty of fluids daily.  Most patients will experience some swelling and bruising in the area of the incisions.  Ice packs will help.  Swelling and bruising can take several days to resolve.   It is common to experience some constipation if taking pain medication after surgery.  Increasing fluid intake and taking a stool softener (such as Colace) will usually help or prevent this problem from occurring.  A mild laxative (Milk of Magnesia or Miralax) should be taken according to package instructions if there are no bowel movements after 48 hours.  Unless discharge instructions indicate otherwise, you may remove your bandages 24-48 hours after surgery, and you may shower at that time.  You may have steri-strips (small skin tapes) in place directly over the incision.  These strips should be left on the skin for 7-10 days.  If your surgeon used skin glue on the incision, you may shower in 24 hours.  The glue will flake off over the next 2-3 weeks.  Any sutures or staples will be removed at the office during your follow-up visit.  ACTIVITIES:  You may resume regular (light) daily activities beginning the  next day-such as daily self-care, walking, climbing stairs-gradually increasing activities as tolerated.  You may have sexual intercourse when it is comfortable.  Refrain from any heavy lifting or straining until approved by your doctor.  You may drive when you are no longer taking prescription pain medication, you can comfortably wear a seatbelt, and you can safely maneuver your car and apply brakes.  You should see your doctor in the office for a follow-up appointment approximately 2-3 weeks after your surgery.  Make sure that you call for this appointment within a day or two after you arrive home to insure a convenient appointment time.  WHEN TO CALL YOUR DOCTOR: 1. Fever over 101.0 2. Inability to urinate 3. Continued bleeding from incision 4. Increased pain, redness, or drainage from the incision 5. Increasing abdominal pain  The clinic staff is available to answer your questions during regular business hours.  Please don't hesitate to call and ask to speak to one of the nurses for clinical concerns.  If you have a medical emergency, go to the nearest emergency room or call 911.  A surgeon from [facility] is always on call for the hospital.  Juandavid Dallman M. Anaise Sterbenz, MD, FACS [facility] Office: [phone] Toll Free:  [phone] FAX [phone]  Web site: [email or web address] 

## 2013-08-03 ENCOUNTER — Encounter (INDEPENDENT_AMBULATORY_CARE_PROVIDER_SITE_OTHER): Payer: Self-pay

## 2013-08-08 ENCOUNTER — Ambulatory Visit (INDEPENDENT_AMBULATORY_CARE_PROVIDER_SITE_OTHER): Payer: PRIVATE HEALTH INSURANCE | Admitting: General Surgery

## 2013-08-16 ENCOUNTER — Encounter (HOSPITAL_COMMUNITY): Admission: RE | Payer: Self-pay | Source: Ambulatory Visit

## 2013-08-16 ENCOUNTER — Ambulatory Visit (HOSPITAL_COMMUNITY): Admission: RE | Admit: 2013-08-16 | Payer: Medicare Other | Source: Ambulatory Visit | Admitting: Surgery

## 2013-08-16 SURGERY — LAPAROSCOPIC CHOLECYSTECTOMY WITH INTRAOPERATIVE CHOLANGIOGRAM
Anesthesia: General

## 2013-08-30 ENCOUNTER — Encounter (INDEPENDENT_AMBULATORY_CARE_PROVIDER_SITE_OTHER): Payer: PRIVATE HEALTH INSURANCE | Admitting: Surgery

## 2013-08-31 ENCOUNTER — Encounter (INDEPENDENT_AMBULATORY_CARE_PROVIDER_SITE_OTHER): Payer: Medicare Other | Admitting: Surgery

## 2014-02-13 DIAGNOSIS — H33311 Horseshoe tear of retina without detachment, right eye: Secondary | ICD-10-CM

## 2014-02-13 HISTORY — DX: Horseshoe tear of retina without detachment, right eye: H33.311

## 2014-02-17 LAB — HM MAMMOGRAPHY: HM MAMMO: NORMAL (ref 0–4)

## 2014-03-16 DIAGNOSIS — G894 Chronic pain syndrome: Secondary | ICD-10-CM | POA: Diagnosis not present

## 2014-03-16 DIAGNOSIS — B0229 Other postherpetic nervous system involvement: Secondary | ICD-10-CM | POA: Diagnosis not present

## 2014-03-16 DIAGNOSIS — M545 Low back pain: Secondary | ICD-10-CM | POA: Diagnosis not present

## 2014-03-16 DIAGNOSIS — Z79899 Other long term (current) drug therapy: Secondary | ICD-10-CM | POA: Diagnosis not present

## 2014-03-19 DIAGNOSIS — H43813 Vitreous degeneration, bilateral: Secondary | ICD-10-CM

## 2014-03-19 HISTORY — DX: Vitreous degeneration, bilateral: H43.813

## 2014-03-20 DIAGNOSIS — H43813 Vitreous degeneration, bilateral: Secondary | ICD-10-CM | POA: Diagnosis not present

## 2014-03-20 DIAGNOSIS — H029 Unspecified disorder of eyelid: Secondary | ICD-10-CM | POA: Diagnosis not present

## 2014-03-20 DIAGNOSIS — H269 Unspecified cataract: Secondary | ICD-10-CM | POA: Diagnosis not present

## 2014-03-24 DIAGNOSIS — R42 Dizziness and giddiness: Secondary | ICD-10-CM | POA: Diagnosis not present

## 2014-03-24 DIAGNOSIS — I10 Essential (primary) hypertension: Secondary | ICD-10-CM | POA: Diagnosis not present

## 2014-04-17 DIAGNOSIS — Z882 Allergy status to sulfonamides status: Secondary | ICD-10-CM | POA: Diagnosis not present

## 2014-04-17 DIAGNOSIS — Z881 Allergy status to other antibiotic agents status: Secondary | ICD-10-CM | POA: Diagnosis not present

## 2014-04-17 DIAGNOSIS — H43813 Vitreous degeneration, bilateral: Secondary | ICD-10-CM | POA: Diagnosis not present

## 2014-04-17 DIAGNOSIS — I10 Essential (primary) hypertension: Secondary | ICD-10-CM | POA: Diagnosis not present

## 2014-04-17 DIAGNOSIS — Z880 Allergy status to penicillin: Secondary | ICD-10-CM | POA: Diagnosis not present

## 2014-04-17 DIAGNOSIS — H269 Unspecified cataract: Secondary | ICD-10-CM | POA: Diagnosis not present

## 2014-04-17 DIAGNOSIS — H33001 Unspecified retinal detachment with retinal break, right eye: Secondary | ICD-10-CM | POA: Diagnosis not present

## 2014-05-10 DIAGNOSIS — H01001 Unspecified blepharitis right upper eyelid: Secondary | ICD-10-CM | POA: Diagnosis not present

## 2014-05-12 DIAGNOSIS — H00011 Hordeolum externum right upper eyelid: Secondary | ICD-10-CM | POA: Diagnosis not present

## 2014-05-12 DIAGNOSIS — H00013 Hordeolum externum right eye, unspecified eyelid: Secondary | ICD-10-CM | POA: Diagnosis not present

## 2014-05-16 DIAGNOSIS — H00011 Hordeolum externum right upper eyelid: Secondary | ICD-10-CM | POA: Diagnosis not present

## 2014-06-06 DIAGNOSIS — H0011 Chalazion right upper eyelid: Secondary | ICD-10-CM

## 2014-06-06 DIAGNOSIS — Z881 Allergy status to other antibiotic agents status: Secondary | ICD-10-CM | POA: Diagnosis not present

## 2014-06-06 DIAGNOSIS — Z888 Allergy status to other drugs, medicaments and biological substances status: Secondary | ICD-10-CM | POA: Diagnosis not present

## 2014-06-06 DIAGNOSIS — Z880 Allergy status to penicillin: Secondary | ICD-10-CM | POA: Diagnosis not present

## 2014-06-06 DIAGNOSIS — Z882 Allergy status to sulfonamides status: Secondary | ICD-10-CM | POA: Diagnosis not present

## 2014-06-06 DIAGNOSIS — H2513 Age-related nuclear cataract, bilateral: Secondary | ICD-10-CM

## 2014-06-06 DIAGNOSIS — Z886 Allergy status to analgesic agent status: Secondary | ICD-10-CM | POA: Diagnosis not present

## 2014-06-06 DIAGNOSIS — H2512 Age-related nuclear cataract, left eye: Secondary | ICD-10-CM | POA: Diagnosis not present

## 2014-06-06 DIAGNOSIS — I10 Essential (primary) hypertension: Secondary | ICD-10-CM | POA: Diagnosis not present

## 2014-06-06 HISTORY — DX: Age-related nuclear cataract, bilateral: H25.13

## 2014-06-06 HISTORY — DX: Chalazion right upper eyelid: H00.11

## 2014-07-03 DIAGNOSIS — H2513 Age-related nuclear cataract, bilateral: Secondary | ICD-10-CM | POA: Diagnosis not present

## 2014-07-03 DIAGNOSIS — I10 Essential (primary) hypertension: Secondary | ICD-10-CM | POA: Diagnosis not present

## 2014-07-03 DIAGNOSIS — Z9889 Other specified postprocedural states: Secondary | ICD-10-CM | POA: Diagnosis not present

## 2014-07-03 DIAGNOSIS — Z881 Allergy status to other antibiotic agents status: Secondary | ICD-10-CM | POA: Diagnosis not present

## 2014-07-03 DIAGNOSIS — Z880 Allergy status to penicillin: Secondary | ICD-10-CM | POA: Diagnosis not present

## 2014-07-03 DIAGNOSIS — Z888 Allergy status to other drugs, medicaments and biological substances status: Secondary | ICD-10-CM | POA: Diagnosis not present

## 2014-07-03 DIAGNOSIS — Z79899 Other long term (current) drug therapy: Secondary | ICD-10-CM | POA: Diagnosis not present

## 2014-07-03 DIAGNOSIS — Z48810 Encounter for surgical aftercare following surgery on the sense organs: Secondary | ICD-10-CM | POA: Diagnosis not present

## 2014-07-03 DIAGNOSIS — H0011 Chalazion right upper eyelid: Secondary | ICD-10-CM | POA: Diagnosis not present

## 2014-07-03 DIAGNOSIS — Z886 Allergy status to analgesic agent status: Secondary | ICD-10-CM | POA: Diagnosis not present

## 2014-07-03 DIAGNOSIS — Z882 Allergy status to sulfonamides status: Secondary | ICD-10-CM | POA: Diagnosis not present

## 2014-07-06 DIAGNOSIS — M545 Low back pain: Secondary | ICD-10-CM | POA: Diagnosis not present

## 2014-07-14 DIAGNOSIS — G894 Chronic pain syndrome: Secondary | ICD-10-CM | POA: Diagnosis not present

## 2014-07-14 DIAGNOSIS — B0229 Other postherpetic nervous system involvement: Secondary | ICD-10-CM | POA: Diagnosis not present

## 2014-07-14 DIAGNOSIS — Z79899 Other long term (current) drug therapy: Secondary | ICD-10-CM | POA: Diagnosis not present

## 2014-07-14 DIAGNOSIS — M545 Low back pain: Secondary | ICD-10-CM | POA: Diagnosis not present

## 2014-07-31 DIAGNOSIS — L239 Allergic contact dermatitis, unspecified cause: Secondary | ICD-10-CM | POA: Diagnosis not present

## 2014-08-04 DIAGNOSIS — L03011 Cellulitis of right finger: Secondary | ICD-10-CM | POA: Diagnosis not present

## 2014-08-07 DIAGNOSIS — L03011 Cellulitis of right finger: Secondary | ICD-10-CM | POA: Diagnosis not present

## 2014-08-28 DIAGNOSIS — J4530 Mild persistent asthma, uncomplicated: Secondary | ICD-10-CM | POA: Diagnosis not present

## 2014-08-28 DIAGNOSIS — J40 Bronchitis, not specified as acute or chronic: Secondary | ICD-10-CM | POA: Diagnosis not present

## 2014-09-02 DIAGNOSIS — J019 Acute sinusitis, unspecified: Secondary | ICD-10-CM | POA: Diagnosis not present

## 2014-09-02 DIAGNOSIS — R05 Cough: Secondary | ICD-10-CM | POA: Diagnosis not present

## 2014-09-27 DIAGNOSIS — L608 Other nail disorders: Secondary | ICD-10-CM | POA: Diagnosis not present

## 2014-10-17 DIAGNOSIS — J019 Acute sinusitis, unspecified: Secondary | ICD-10-CM | POA: Diagnosis not present

## 2014-11-14 DIAGNOSIS — B0229 Other postherpetic nervous system involvement: Secondary | ICD-10-CM | POA: Diagnosis not present

## 2014-11-14 DIAGNOSIS — G894 Chronic pain syndrome: Secondary | ICD-10-CM | POA: Diagnosis not present

## 2014-11-14 DIAGNOSIS — M545 Low back pain: Secondary | ICD-10-CM | POA: Diagnosis not present

## 2014-11-14 DIAGNOSIS — Z5181 Encounter for therapeutic drug level monitoring: Secondary | ICD-10-CM | POA: Diagnosis not present

## 2014-11-14 DIAGNOSIS — Z79899 Other long term (current) drug therapy: Secondary | ICD-10-CM | POA: Diagnosis not present

## 2014-11-16 DIAGNOSIS — R0600 Dyspnea, unspecified: Secondary | ICD-10-CM | POA: Diagnosis not present

## 2014-11-16 DIAGNOSIS — R600 Localized edema: Secondary | ICD-10-CM | POA: Diagnosis not present

## 2014-11-16 DIAGNOSIS — I10 Essential (primary) hypertension: Secondary | ICD-10-CM | POA: Diagnosis not present

## 2014-11-17 DIAGNOSIS — L603 Nail dystrophy: Secondary | ICD-10-CM | POA: Diagnosis not present

## 2014-11-17 DIAGNOSIS — L03012 Cellulitis of left finger: Secondary | ICD-10-CM | POA: Diagnosis not present

## 2014-11-24 ENCOUNTER — Encounter (HOSPITAL_BASED_OUTPATIENT_CLINIC_OR_DEPARTMENT_OTHER): Payer: Self-pay | Admitting: *Deleted

## 2014-11-24 ENCOUNTER — Emergency Department (HOSPITAL_BASED_OUTPATIENT_CLINIC_OR_DEPARTMENT_OTHER): Payer: Medicare Other

## 2014-11-24 ENCOUNTER — Emergency Department (HOSPITAL_BASED_OUTPATIENT_CLINIC_OR_DEPARTMENT_OTHER)
Admission: EM | Admit: 2014-11-24 | Discharge: 2014-11-24 | Disposition: A | Discharge status: Home / Self Care | Payer: Medicare Other | Attending: Emergency Medicine | Admitting: Emergency Medicine

## 2014-11-24 DIAGNOSIS — Z91040 Latex allergy status: Secondary | ICD-10-CM | POA: Insufficient documentation

## 2014-11-24 DIAGNOSIS — G43909 Migraine, unspecified, not intractable, without status migrainosus: Secondary | ICD-10-CM | POA: Diagnosis not present

## 2014-11-24 DIAGNOSIS — Z79899 Other long term (current) drug therapy: Secondary | ICD-10-CM | POA: Diagnosis not present

## 2014-11-24 DIAGNOSIS — Y9389 Activity, other specified: Secondary | ICD-10-CM | POA: Insufficient documentation

## 2014-11-24 DIAGNOSIS — S060X0A Concussion without loss of consciousness, initial encounter: Secondary | ICD-10-CM | POA: Insufficient documentation

## 2014-11-24 DIAGNOSIS — Y998 Other external cause status: Secondary | ICD-10-CM | POA: Insufficient documentation

## 2014-11-24 DIAGNOSIS — W19XXXA Unspecified fall, initial encounter: Secondary | ICD-10-CM

## 2014-11-24 DIAGNOSIS — I10 Essential (primary) hypertension: Secondary | ICD-10-CM | POA: Diagnosis not present

## 2014-11-24 DIAGNOSIS — Z792 Long term (current) use of antibiotics: Secondary | ICD-10-CM | POA: Insufficient documentation

## 2014-11-24 DIAGNOSIS — S0990XA Unspecified injury of head, initial encounter: Secondary | ICD-10-CM | POA: Diagnosis present

## 2014-11-24 DIAGNOSIS — Y9289 Other specified places as the place of occurrence of the external cause: Secondary | ICD-10-CM | POA: Insufficient documentation

## 2014-11-24 DIAGNOSIS — W01198A Fall on same level from slipping, tripping and stumbling with subsequent striking against other object, initial encounter: Secondary | ICD-10-CM | POA: Insufficient documentation

## 2014-11-24 DIAGNOSIS — Z880 Allergy status to penicillin: Secondary | ICD-10-CM | POA: Insufficient documentation

## 2014-11-24 DIAGNOSIS — R51 Headache: Secondary | ICD-10-CM | POA: Diagnosis not present

## 2014-11-24 NOTE — ED Provider Notes (Signed)
CSN: [id number]     Arrival date & time 11/24/14  1133 History   First MD Initiated Contact with Patient 11/24/14 1150     Chief Complaint  Patient presents with  . Head Injury     (Consider location/radiation/quality/duration/timing/severity/associated sxs/prior Treatment) HPI Comments: Patient is a 74 year old female with history of hypertension and migraine headaches. She presents for evaluation of head injury. She states that she slipped on a tissue that was on the floor at approximately 1 AM today. She slipped backward and hit her head on a planter. She denies any loss of consciousness but reports swelling to her occiput and tenderness since that time. She has continued to have a headache but denies any visual changes.  Patient is a 74 y.o. female presenting with head injury. The history is provided by the patient.  Head Injury Location:  Occipital Time since incident:  10 hours Mechanism of injury: fall   Pain details:    Quality:  Dull   Severity:  Moderate   Timing:  Constant   Progression:  Unchanged Chronicity:  New Relieved by:  Nothing Worsened by:  Nothing tried Ineffective treatments:  None tried Associated symptoms: headache   Associated symptoms: no blurred vision and no neck pain     Past Medical History  Diagnosis Date  . Head injury   . Nerve damage   . Hypertension   . Migraines    Past Surgical History  Procedure Laterality Date  . Brain surgery    . Nerve surgery    . Hip surgery      intracranial surgery  . Tonsillectomy    . Craniotomy for extracranial-intracranial bypass     Family History  Problem Relation Age of Onset  . Heart disease Father    Social History  Substance Use Topics  . Smoking status: Never Smoker   . Smokeless tobacco: None  . Alcohol Use: Yes     Comment: social   OB History    No data available     Review of Systems  Eyes: Negative for blurred vision.  Musculoskeletal: Negative for neck pain.  Neurological:  Positive for headaches.  All other systems reviewed and are negative.     Allergies  Aspirin; Avelox; Cefdinir; Chromium; Ciprofloxacin; Erythromycin; Influenza vaccines; Iodine; Keflex; Latex; Lisinopril; Nitrofuran derivatives; Penicillins; Percodan; Prednisone; Shellfish allergy; Sulfa antibiotics; Sulfacetamide sodium; and Xopenex  Home Medications   Prior to Admission medications   Medication Sig Start Date End Date Taking? Authorizing Provider  butalbital-acetaminophen-caffeine (FIORICET, ESGIC) 50-325-40 MG per tablet Take by mouth 2 (two) times daily as needed for headache.    Historical Provider, MD  cephALEXin (KEFLEX) 500 MG capsule Take 1 capsule (500 mg total) by mouth 4 (four) times daily. 06/13/13   Orpah Greek, MD  Cholecalciferol (D 5000) 5000 UNITS TABS daily at 0600.    Historical Provider, MD  diazepam (VALIUM) 5 MG tablet Take 1 tablet (5 mg total) by mouth every 12 (twelve) hours as needed for muscle spasms. 04/16/13   Orpah Greek, MD  Nebivolol HCl (BYSTOLIC PO) Take by mouth.    Historical Provider, MD  nystatin 100000 UNITS vaginal tablet Place 1 tablet vaginally at bedtime.    Historical Provider, MD  omeprazole (PRILOSEC OTC) 20 MG tablet 2 mg. 12/10/10   Historical Provider, MD  phenazopyridine (PYRIDIUM) 200 MG tablet Take 1 tablet (200 mg total) by mouth 3 (three) times daily as needed for pain. 06/13/13   Orpah Greek, MD  promethazine (PHENERGAN) 25 MG tablet Take 1 tablet (25 mg total) by mouth every 6 (six) hours as needed for nausea or vomiting. 04/16/13   Orpah Greek, MD  Valsartan (DIOVAN PO) Take by mouth.    Historical Provider, MD  valsartan (DIOVAN) 80 MG tablet  06/03/13   Historical Provider, MD   There were no vitals taken for this visit. Physical Exam  Constitutional: She is oriented to person, place, and time. She appears well-developed and well-nourished. No distress.  HENT:  Head: Normocephalic and  atraumatic.  There is slight swelling to the occipital region.  TMs are clear bilaterally.  Eyes: EOM are normal. Pupils are equal, round, and reactive to light.  Neck: Normal range of motion. Neck supple.  Cardiovascular: Normal rate and regular rhythm.  Exam reveals no gallop and no friction rub.   No murmur heard. Pulmonary/Chest: Effort normal and breath sounds normal. No respiratory distress. She has no wheezes.  Abdominal: Soft. Bowel sounds are normal. She exhibits no distension. There is no tenderness.  Musculoskeletal: Normal range of motion.  Neurological: She is alert and oriented to person, place, and time. No cranial nerve deficit. She exhibits normal muscle tone. Coordination normal.  Skin: Skin is warm and dry. She is not diaphoretic.  Nursing note and vitals reviewed.   ED Course  Procedures (including critical care time) Labs Review Labs Reviewed - No data to display  Imaging Review No results found. I have personally reviewed and evaluated these images and lab results as part of my medical decision-making.   EKG Interpretation None      MDM   Final diagnoses:  None    CT scan of the head is unremarkable and her neurologic exam is nonfocal. Will be treated as a concussion. I will recommend Tylenol as needed for headache and when necessary return.    Veryl Speak, MD 11/24/14 [phone]

## 2014-11-24 NOTE — ED Notes (Signed)
MD at bedside. Palpation to back of head is tender to touch. Pt reports no N/V. Just dizziness.

## 2014-11-24 NOTE — ED Notes (Signed)
This am she slipped on tissue paper and hit the back of her head on a brass and concrete container. No LOC. She used ice at the time of the injury. Here for headache and hematoma on the back of her head.

## 2014-11-24 NOTE — Discharge Instructions (Signed)
Tylenol 1000 mg every 6 hours as needed for pain.  Return to the ER if symptoms significantly worsen or change.   Concussion, Adult A concussion, or closed-head injury, is a brain injury caused by a direct blow to the head or by a quick and sudden movement (jolt) of the head or neck. Concussions are usually not life-threatening. Even so, the effects of a concussion can be serious. If you have had a concussion before, you are more likely to experience concussion-like symptoms after a direct blow to the head.  CAUSES  Direct blow to the head, such as from running into another player during a soccer game, being hit in a fight, or hitting your head on a hard surface.  A jolt of the head or neck that causes the brain to move back and forth inside the skull, such as in a car crash. SIGNS AND SYMPTOMS The signs of a concussion can be hard to notice. Early on, they may be missed by you, family members, and health care providers. You may look fine but act or feel differently. Symptoms are usually temporary, but they may last for days, weeks, or even longer. Some symptoms may appear right away while others may not show up for hours or days. Every head injury is different. Symptoms include:  Mild to moderate headaches that will not go away.  A feeling of pressure inside your head.  Having more trouble than usual:  Learning or remembering things you have heard.  Answering questions.  Paying attention or concentrating.  Organizing daily tasks.  Making decisions and solving problems.  Slowness in thinking, acting or reacting, speaking, or reading.  Getting lost or being easily confused.  Feeling tired all the time or lacking energy (fatigued).  Feeling drowsy.  Sleep disturbances.  Sleeping more than usual.  Sleeping less than usual.  Trouble falling asleep.  Trouble sleeping (insomnia).  Loss of balance or feeling lightheaded or dizzy.  Nausea or vomiting.  Numbness or  tingling.  Increased sensitivity to:  Sounds.  Lights.  Distractions.  Vision problems or eyes that tire easily.  Diminished sense of taste or smell.  Ringing in the ears.  Mood changes such as feeling sad or anxious.  Becoming easily irritated or angry for little or no reason.  Lack of motivation.  Seeing or hearing things other people do not see or hear (hallucinations). DIAGNOSIS Your health care provider can usually diagnose a concussion based on a description of your injury and symptoms. He or she will ask whether you passed out (lost consciousness) and whether you are having trouble remembering events that happened right before and during your injury. Your evaluation might include:  A brain scan to look for signs of injury to the brain. Even if the test shows no injury, you may still have a concussion.  Blood tests to be sure other problems are not present. TREATMENT  Concussions are usually treated in an emergency department, in urgent care, or at a clinic. You may need to stay in the hospital overnight for further treatment.  Tell your health care provider if you are taking any medicines, including prescription medicines, over-the-counter medicines, and natural remedies. Some medicines, such as blood thinners (anticoagulants) and aspirin, may increase the chance of complications. Also tell your health care provider whether you have had alcohol or are taking illegal drugs. This information may affect treatment.  Your health care provider will send you home with important instructions to follow.  How fast you will  recover from a concussion depends on many factors. These factors include how severe your concussion is, what part of your brain was injured, your age, and how healthy you were before the concussion.  Most people with mild injuries recover fully. Recovery can take time. In general, recovery is slower in older persons. Also, persons who have had a concussion in  the past or have other medical problems may find that it takes longer to recover from their current injury. HOME CARE INSTRUCTIONS General Instructions  Carefully follow the directions your health care provider gave you.  Only take over-the-counter or prescription medicines for pain, discomfort, or fever as directed by your health care provider.  Take only those medicines that your health care provider has approved.  Do not drink alcohol until your health care provider says you are well enough to do so. Alcohol and certain other drugs may slow your recovery and can put you at risk of further injury.  If it is harder than usual to remember things, write them down.  If you are easily distracted, try to do one thing at a time. For example, do not try to watch TV while fixing dinner.  Talk with family members or close friends when making important decisions.  Keep all follow-up appointments. Repeated evaluation of your symptoms is recommended for your recovery.  Watch your symptoms and tell others to do the same. Complications sometimes occur after a concussion. Older adults with a brain injury may have a higher risk of serious complications, such as a blood clot on the brain.  Tell your teachers, school nurse, school counselor, coach, athletic trainer, or work Freight forwarder about your injury, symptoms, and restrictions. Tell them about what you can or cannot do. They should watch for:  Increased problems with attention or concentration.  Increased difficulty remembering or learning new information.  Increased time needed to complete tasks or assignments.  Increased irritability or decreased ability to cope with stress.  Increased symptoms.  Rest. Rest helps the brain to heal. Make sure you:  Get plenty of sleep at night. Avoid staying up late at night.  Keep the same bedtime hours on weekends and weekdays.  Rest during the day. Take daytime naps or rest breaks when you feel  tired.  Limit activities that require a lot of thought or concentration. These include:  Doing homework or job-related work.  Watching TV.  Working on the computer.  Avoid any situation where there is potential for another head injury (football, hockey, soccer, basketball, martial arts, downhill snow sports and horseback riding). Your condition will get worse every time you experience a concussion. You should avoid these activities until you are evaluated by the appropriate follow-up health care providers. Returning To Your Regular Activities You will need to return to your normal activities slowly, not all at once. You must give your body and brain enough time for recovery.  Do not return to sports or other athletic activities until your health care provider tells you it is safe to do so.  Ask your health care provider when you can drive, ride a bicycle, or operate heavy machinery. Your ability to react may be slower after a brain injury. Never do these activities if you are dizzy.  Ask your health care provider about when you can return to work or school. Preventing Another Concussion It is very important to avoid another brain injury, especially before you have recovered. In rare cases, another injury can lead to permanent brain damage, brain swelling,  or death. The risk of this is greatest during the first 7-10 days after a head injury. Avoid injuries by:  Wearing a seat belt when riding in a car.  Drinking alcohol only in moderation.  Wearing a helmet when biking, skiing, skateboarding, skating, or doing similar activities.  Avoiding activities that could lead to a second concussion, such as contact or recreational sports, until your health care provider says it is okay.  Taking safety measures in your home.  Remove clutter and tripping hazards from floors and stairways.  Use grab bars in bathrooms and handrails by stairs.  Place non-slip mats on floors and in  bathtubs.  Improve lighting in dim areas. SEEK MEDICAL CARE IF:  You have increased problems paying attention or concentrating.  You have increased difficulty remembering or learning new information.  You need more time to complete tasks or assignments than before.  You have increased irritability or decreased ability to cope with stress.  You have more symptoms than before. Seek medical care if you have any of the following symptoms for more than 2 weeks after your injury:  Lasting (chronic) headaches.  Dizziness or balance problems.  Nausea.  Vision problems.  Increased sensitivity to noise or light.  Depression or mood swings.  Anxiety or irritability.  Memory problems.  Difficulty concentrating or paying attention.  Sleep problems.  Feeling tired all the time. SEEK IMMEDIATE MEDICAL CARE IF:  You have severe or worsening headaches. These may be a sign of a blood clot in the brain.  You have weakness (even if only in one hand, leg, or part of the face).  You have numbness.  You have decreased coordination.  You vomit repeatedly.  You have increased sleepiness.  One pupil is larger than the other.  You have convulsions.  You have slurred speech.  You have increased confusion. This may be a sign of a blood clot in the brain.  You have increased restlessness, agitation, or irritability.  You are unable to recognize people or places.  You have neck pain.  It is difficult to wake you up.  You have unusual behavior changes.  You lose consciousness. MAKE SURE YOU:  Understand these instructions.  Will watch your condition.  Will get help right away if you are not doing well or get worse.   This information is not intended to replace advice given to you by your health care provider. Make sure you discuss any questions you have with your health care provider.   Document Released: 04/26/2003 Document Revised: 02/24/2014 Document Reviewed:  08/26/2012 Elsevier Interactive Patient Education [employer].

## 2014-11-29 DIAGNOSIS — R600 Localized edema: Secondary | ICD-10-CM | POA: Diagnosis not present

## 2014-11-29 DIAGNOSIS — R0600 Dyspnea, unspecified: Secondary | ICD-10-CM | POA: Diagnosis not present

## 2014-11-29 DIAGNOSIS — I10 Essential (primary) hypertension: Secondary | ICD-10-CM | POA: Diagnosis not present

## 2014-12-12 DIAGNOSIS — R600 Localized edema: Secondary | ICD-10-CM | POA: Diagnosis not present

## 2014-12-12 DIAGNOSIS — I10 Essential (primary) hypertension: Secondary | ICD-10-CM | POA: Diagnosis not present

## 2014-12-13 DIAGNOSIS — I10 Essential (primary) hypertension: Secondary | ICD-10-CM | POA: Diagnosis not present

## 2014-12-13 DIAGNOSIS — R0600 Dyspnea, unspecified: Secondary | ICD-10-CM | POA: Diagnosis not present

## 2014-12-13 DIAGNOSIS — R600 Localized edema: Secondary | ICD-10-CM | POA: Diagnosis not present

## 2014-12-19 DIAGNOSIS — W010XXD Fall on same level from slipping, tripping and stumbling without subsequent striking against object, subsequent encounter: Secondary | ICD-10-CM | POA: Diagnosis not present

## 2014-12-19 DIAGNOSIS — M6281 Muscle weakness (generalized): Secondary | ICD-10-CM | POA: Diagnosis not present

## 2014-12-19 DIAGNOSIS — M545 Low back pain: Secondary | ICD-10-CM | POA: Diagnosis not present

## 2014-12-19 DIAGNOSIS — R293 Abnormal posture: Secondary | ICD-10-CM | POA: Diagnosis not present

## 2014-12-25 DIAGNOSIS — M6281 Muscle weakness (generalized): Secondary | ICD-10-CM | POA: Diagnosis not present

## 2014-12-25 DIAGNOSIS — R293 Abnormal posture: Secondary | ICD-10-CM | POA: Diagnosis not present

## 2014-12-25 DIAGNOSIS — M545 Low back pain: Secondary | ICD-10-CM | POA: Diagnosis not present

## 2014-12-25 DIAGNOSIS — W010XXD Fall on same level from slipping, tripping and stumbling without subsequent striking against object, subsequent encounter: Secondary | ICD-10-CM | POA: Diagnosis not present

## 2014-12-27 DIAGNOSIS — W010XXD Fall on same level from slipping, tripping and stumbling without subsequent striking against object, subsequent encounter: Secondary | ICD-10-CM | POA: Diagnosis not present

## 2014-12-27 DIAGNOSIS — M6281 Muscle weakness (generalized): Secondary | ICD-10-CM | POA: Diagnosis not present

## 2014-12-27 DIAGNOSIS — M545 Low back pain: Secondary | ICD-10-CM | POA: Diagnosis not present

## 2014-12-27 DIAGNOSIS — R293 Abnormal posture: Secondary | ICD-10-CM | POA: Diagnosis not present

## 2014-12-28 DIAGNOSIS — H01021 Squamous blepharitis right upper eyelid: Secondary | ICD-10-CM | POA: Diagnosis not present

## 2014-12-28 DIAGNOSIS — H01022 Squamous blepharitis right lower eyelid: Secondary | ICD-10-CM | POA: Diagnosis not present

## 2014-12-28 DIAGNOSIS — H01025 Squamous blepharitis left lower eyelid: Secondary | ICD-10-CM | POA: Diagnosis not present

## 2014-12-28 DIAGNOSIS — H01024 Squamous blepharitis left upper eyelid: Secondary | ICD-10-CM | POA: Diagnosis not present

## 2015-01-01 DIAGNOSIS — M6281 Muscle weakness (generalized): Secondary | ICD-10-CM | POA: Diagnosis not present

## 2015-01-01 DIAGNOSIS — W010XXD Fall on same level from slipping, tripping and stumbling without subsequent striking against object, subsequent encounter: Secondary | ICD-10-CM | POA: Diagnosis not present

## 2015-01-01 DIAGNOSIS — M545 Low back pain: Secondary | ICD-10-CM | POA: Diagnosis not present

## 2015-01-01 DIAGNOSIS — R293 Abnormal posture: Secondary | ICD-10-CM | POA: Diagnosis not present

## 2015-01-04 DIAGNOSIS — W010XXD Fall on same level from slipping, tripping and stumbling without subsequent striking against object, subsequent encounter: Secondary | ICD-10-CM | POA: Diagnosis not present

## 2015-01-04 DIAGNOSIS — R293 Abnormal posture: Secondary | ICD-10-CM | POA: Diagnosis not present

## 2015-01-04 DIAGNOSIS — M545 Low back pain: Secondary | ICD-10-CM | POA: Diagnosis not present

## 2015-01-04 DIAGNOSIS — M6281 Muscle weakness (generalized): Secondary | ICD-10-CM | POA: Diagnosis not present

## 2015-01-23 DIAGNOSIS — M6281 Muscle weakness (generalized): Secondary | ICD-10-CM | POA: Diagnosis not present

## 2015-01-23 DIAGNOSIS — R293 Abnormal posture: Secondary | ICD-10-CM | POA: Diagnosis not present

## 2015-01-23 DIAGNOSIS — M545 Low back pain: Secondary | ICD-10-CM | POA: Diagnosis not present

## 2015-01-23 DIAGNOSIS — W010XXD Fall on same level from slipping, tripping and stumbling without subsequent striking against object, subsequent encounter: Secondary | ICD-10-CM | POA: Diagnosis not present

## 2015-01-25 DIAGNOSIS — W010XXD Fall on same level from slipping, tripping and stumbling without subsequent striking against object, subsequent encounter: Secondary | ICD-10-CM | POA: Diagnosis not present

## 2015-01-25 DIAGNOSIS — M545 Low back pain: Secondary | ICD-10-CM | POA: Diagnosis not present

## 2015-01-25 DIAGNOSIS — M6281 Muscle weakness (generalized): Secondary | ICD-10-CM | POA: Diagnosis not present

## 2015-01-25 DIAGNOSIS — R293 Abnormal posture: Secondary | ICD-10-CM | POA: Diagnosis not present

## 2015-02-14 DIAGNOSIS — J0110 Acute frontal sinusitis, unspecified: Secondary | ICD-10-CM | POA: Diagnosis not present

## 2015-02-14 DIAGNOSIS — R05 Cough: Secondary | ICD-10-CM | POA: Diagnosis not present

## 2015-02-20 DIAGNOSIS — I10 Essential (primary) hypertension: Secondary | ICD-10-CM | POA: Diagnosis not present

## 2015-02-20 DIAGNOSIS — H33001 Unspecified retinal detachment with retinal break, right eye: Secondary | ICD-10-CM | POA: Diagnosis not present

## 2015-02-20 DIAGNOSIS — Z880 Allergy status to penicillin: Secondary | ICD-10-CM | POA: Diagnosis not present

## 2015-02-20 DIAGNOSIS — H2513 Age-related nuclear cataract, bilateral: Secondary | ICD-10-CM | POA: Diagnosis not present

## 2015-02-20 DIAGNOSIS — Z79899 Other long term (current) drug therapy: Secondary | ICD-10-CM | POA: Diagnosis not present

## 2015-02-20 DIAGNOSIS — Z7982 Long term (current) use of aspirin: Secondary | ICD-10-CM | POA: Diagnosis not present

## 2015-02-20 DIAGNOSIS — Z882 Allergy status to sulfonamides status: Secondary | ICD-10-CM | POA: Diagnosis not present

## 2015-02-20 DIAGNOSIS — Z888 Allergy status to other drugs, medicaments and biological substances status: Secondary | ICD-10-CM | POA: Diagnosis not present

## 2015-02-20 DIAGNOSIS — Z881 Allergy status to other antibiotic agents status: Secondary | ICD-10-CM | POA: Diagnosis not present

## 2015-02-20 DIAGNOSIS — Z9889 Other specified postprocedural states: Secondary | ICD-10-CM | POA: Diagnosis not present

## 2015-02-20 DIAGNOSIS — H43813 Vitreous degeneration, bilateral: Secondary | ICD-10-CM | POA: Diagnosis not present

## 2015-03-05 DIAGNOSIS — B0229 Other postherpetic nervous system involvement: Secondary | ICD-10-CM | POA: Diagnosis not present

## 2015-03-05 DIAGNOSIS — G894 Chronic pain syndrome: Secondary | ICD-10-CM | POA: Diagnosis not present

## 2015-03-05 DIAGNOSIS — M25551 Pain in right hip: Secondary | ICD-10-CM | POA: Diagnosis not present

## 2015-03-05 DIAGNOSIS — M545 Low back pain: Secondary | ICD-10-CM | POA: Diagnosis not present

## 2015-03-21 DIAGNOSIS — M6281 Muscle weakness (generalized): Secondary | ICD-10-CM | POA: Diagnosis not present

## 2015-03-21 DIAGNOSIS — W010XXD Fall on same level from slipping, tripping and stumbling without subsequent striking against object, subsequent encounter: Secondary | ICD-10-CM | POA: Diagnosis not present

## 2015-03-21 DIAGNOSIS — M545 Low back pain: Secondary | ICD-10-CM | POA: Diagnosis not present

## 2015-03-21 DIAGNOSIS — R293 Abnormal posture: Secondary | ICD-10-CM | POA: Diagnosis not present

## 2015-03-27 DIAGNOSIS — M545 Low back pain: Secondary | ICD-10-CM | POA: Diagnosis not present

## 2015-03-27 DIAGNOSIS — W010XXD Fall on same level from slipping, tripping and stumbling without subsequent striking against object, subsequent encounter: Secondary | ICD-10-CM | POA: Diagnosis not present

## 2015-03-27 DIAGNOSIS — R293 Abnormal posture: Secondary | ICD-10-CM | POA: Diagnosis not present

## 2015-03-27 DIAGNOSIS — M6281 Muscle weakness (generalized): Secondary | ICD-10-CM | POA: Diagnosis not present

## 2015-03-30 DIAGNOSIS — M6281 Muscle weakness (generalized): Secondary | ICD-10-CM | POA: Diagnosis not present

## 2015-03-30 DIAGNOSIS — M545 Low back pain: Secondary | ICD-10-CM | POA: Diagnosis not present

## 2015-03-30 DIAGNOSIS — W010XXD Fall on same level from slipping, tripping and stumbling without subsequent striking against object, subsequent encounter: Secondary | ICD-10-CM | POA: Diagnosis not present

## 2015-03-30 DIAGNOSIS — R293 Abnormal posture: Secondary | ICD-10-CM | POA: Diagnosis not present

## 2015-04-05 DIAGNOSIS — M545 Low back pain: Secondary | ICD-10-CM | POA: Diagnosis not present

## 2015-04-05 DIAGNOSIS — W010XXD Fall on same level from slipping, tripping and stumbling without subsequent striking against object, subsequent encounter: Secondary | ICD-10-CM | POA: Diagnosis not present

## 2015-04-05 DIAGNOSIS — M6281 Muscle weakness (generalized): Secondary | ICD-10-CM | POA: Diagnosis not present

## 2015-04-05 DIAGNOSIS — R293 Abnormal posture: Secondary | ICD-10-CM | POA: Diagnosis not present

## 2015-04-09 DIAGNOSIS — W010XXD Fall on same level from slipping, tripping and stumbling without subsequent striking against object, subsequent encounter: Secondary | ICD-10-CM | POA: Diagnosis not present

## 2015-04-09 DIAGNOSIS — R293 Abnormal posture: Secondary | ICD-10-CM | POA: Diagnosis not present

## 2015-04-09 DIAGNOSIS — M6281 Muscle weakness (generalized): Secondary | ICD-10-CM | POA: Diagnosis not present

## 2015-04-09 DIAGNOSIS — M545 Low back pain: Secondary | ICD-10-CM | POA: Diagnosis not present

## 2015-04-11 DIAGNOSIS — M6281 Muscle weakness (generalized): Secondary | ICD-10-CM | POA: Diagnosis not present

## 2015-04-11 DIAGNOSIS — W010XXD Fall on same level from slipping, tripping and stumbling without subsequent striking against object, subsequent encounter: Secondary | ICD-10-CM | POA: Diagnosis not present

## 2015-04-11 DIAGNOSIS — R293 Abnormal posture: Secondary | ICD-10-CM | POA: Diagnosis not present

## 2015-04-11 DIAGNOSIS — M545 Low back pain: Secondary | ICD-10-CM | POA: Diagnosis not present

## 2015-04-16 DIAGNOSIS — R208 Other disturbances of skin sensation: Secondary | ICD-10-CM | POA: Diagnosis not present

## 2015-04-16 DIAGNOSIS — Z9181 History of falling: Secondary | ICD-10-CM | POA: Diagnosis not present

## 2015-04-17 DIAGNOSIS — M6281 Muscle weakness (generalized): Secondary | ICD-10-CM | POA: Diagnosis not present

## 2015-04-17 DIAGNOSIS — R293 Abnormal posture: Secondary | ICD-10-CM | POA: Diagnosis not present

## 2015-04-17 DIAGNOSIS — M545 Low back pain: Secondary | ICD-10-CM | POA: Diagnosis not present

## 2015-04-17 DIAGNOSIS — W010XXD Fall on same level from slipping, tripping and stumbling without subsequent striking against object, subsequent encounter: Secondary | ICD-10-CM | POA: Diagnosis not present

## 2015-04-19 DIAGNOSIS — S0990XA Unspecified injury of head, initial encounter: Secondary | ICD-10-CM | POA: Diagnosis not present

## 2015-04-19 DIAGNOSIS — R208 Other disturbances of skin sensation: Secondary | ICD-10-CM | POA: Diagnosis not present

## 2015-04-25 DIAGNOSIS — M545 Low back pain: Secondary | ICD-10-CM | POA: Diagnosis not present

## 2015-04-25 DIAGNOSIS — W010XXD Fall on same level from slipping, tripping and stumbling without subsequent striking against object, subsequent encounter: Secondary | ICD-10-CM | POA: Diagnosis not present

## 2015-04-25 DIAGNOSIS — M6281 Muscle weakness (generalized): Secondary | ICD-10-CM | POA: Diagnosis not present

## 2015-04-25 DIAGNOSIS — R293 Abnormal posture: Secondary | ICD-10-CM | POA: Diagnosis not present

## 2015-05-16 DIAGNOSIS — H01024 Squamous blepharitis left upper eyelid: Secondary | ICD-10-CM | POA: Diagnosis not present

## 2015-05-16 DIAGNOSIS — I10 Essential (primary) hypertension: Secondary | ICD-10-CM | POA: Diagnosis not present

## 2015-05-16 DIAGNOSIS — H2513 Age-related nuclear cataract, bilateral: Secondary | ICD-10-CM | POA: Diagnosis not present

## 2015-05-16 DIAGNOSIS — H01025 Squamous blepharitis left lower eyelid: Secondary | ICD-10-CM | POA: Diagnosis not present

## 2015-05-16 DIAGNOSIS — H1131 Conjunctival hemorrhage, right eye: Secondary | ICD-10-CM | POA: Diagnosis not present

## 2015-05-16 DIAGNOSIS — H01021 Squamous blepharitis right upper eyelid: Secondary | ICD-10-CM | POA: Diagnosis not present

## 2015-05-16 DIAGNOSIS — H01022 Squamous blepharitis right lower eyelid: Secondary | ICD-10-CM | POA: Diagnosis not present

## 2015-05-16 DIAGNOSIS — Z881 Allergy status to other antibiotic agents status: Secondary | ICD-10-CM | POA: Diagnosis not present

## 2015-05-16 DIAGNOSIS — Z880 Allergy status to penicillin: Secondary | ICD-10-CM | POA: Diagnosis not present

## 2015-05-16 DIAGNOSIS — Z886 Allergy status to analgesic agent status: Secondary | ICD-10-CM | POA: Diagnosis not present

## 2015-05-16 DIAGNOSIS — Z882 Allergy status to sulfonamides status: Secondary | ICD-10-CM | POA: Diagnosis not present

## 2015-05-16 DIAGNOSIS — Z888 Allergy status to other drugs, medicaments and biological substances status: Secondary | ICD-10-CM | POA: Diagnosis not present

## 2015-05-16 DIAGNOSIS — Z79899 Other long term (current) drug therapy: Secondary | ICD-10-CM | POA: Diagnosis not present

## 2015-05-22 DIAGNOSIS — Z882 Allergy status to sulfonamides status: Secondary | ICD-10-CM | POA: Diagnosis not present

## 2015-05-22 DIAGNOSIS — Z9889 Other specified postprocedural states: Secondary | ICD-10-CM | POA: Diagnosis not present

## 2015-05-22 DIAGNOSIS — L720 Epidermal cyst: Secondary | ICD-10-CM | POA: Diagnosis not present

## 2015-05-22 DIAGNOSIS — Z79899 Other long term (current) drug therapy: Secondary | ICD-10-CM | POA: Diagnosis not present

## 2015-05-22 DIAGNOSIS — H01025 Squamous blepharitis left lower eyelid: Secondary | ICD-10-CM | POA: Diagnosis not present

## 2015-05-22 DIAGNOSIS — H01022 Squamous blepharitis right lower eyelid: Secondary | ICD-10-CM | POA: Diagnosis not present

## 2015-05-22 DIAGNOSIS — H01024 Squamous blepharitis left upper eyelid: Secondary | ICD-10-CM | POA: Diagnosis not present

## 2015-05-22 DIAGNOSIS — H01021 Squamous blepharitis right upper eyelid: Secondary | ICD-10-CM | POA: Diagnosis not present

## 2015-05-22 DIAGNOSIS — H01009 Unspecified blepharitis unspecified eye, unspecified eyelid: Secondary | ICD-10-CM | POA: Diagnosis not present

## 2015-05-22 DIAGNOSIS — Z792 Long term (current) use of antibiotics: Secondary | ICD-10-CM | POA: Diagnosis not present

## 2015-05-22 DIAGNOSIS — H25093 Other age-related incipient cataract, bilateral: Secondary | ICD-10-CM | POA: Diagnosis not present

## 2015-05-22 DIAGNOSIS — H029 Unspecified disorder of eyelid: Secondary | ICD-10-CM | POA: Diagnosis not present

## 2015-05-22 DIAGNOSIS — Z888 Allergy status to other drugs, medicaments and biological substances status: Secondary | ICD-10-CM | POA: Diagnosis not present

## 2015-05-22 DIAGNOSIS — Z881 Allergy status to other antibiotic agents status: Secondary | ICD-10-CM | POA: Diagnosis not present

## 2015-05-22 DIAGNOSIS — Z880 Allergy status to penicillin: Secondary | ICD-10-CM | POA: Diagnosis not present

## 2015-05-22 HISTORY — PX: EYE SURGERY: SHX253

## 2015-05-23 ENCOUNTER — Encounter: Payer: Self-pay | Admitting: *Deleted

## 2015-05-23 ENCOUNTER — Telehealth: Payer: Self-pay | Admitting: *Deleted

## 2015-05-23 NOTE — Telephone Encounter (Signed)
Pre-Visit Call completed with patient and chart updated.   Pre-Visit Info documented in Specialty Comments under [employer].    

## 2015-05-24 ENCOUNTER — Ambulatory Visit (INDEPENDENT_AMBULATORY_CARE_PROVIDER_SITE_OTHER): Payer: Medicare Other | Admitting: Family Medicine

## 2015-05-24 ENCOUNTER — Encounter: Payer: Self-pay | Admitting: Family Medicine

## 2015-05-24 VITALS — BP 128/74 | HR 63 | Temp 97.7°F | Ht 63.0 in | Wt 186.8 lb

## 2015-05-24 DIAGNOSIS — M25551 Pain in right hip: Secondary | ICD-10-CM

## 2015-05-24 DIAGNOSIS — G8929 Other chronic pain: Secondary | ICD-10-CM

## 2015-05-24 DIAGNOSIS — I10 Essential (primary) hypertension: Secondary | ICD-10-CM

## 2015-05-24 NOTE — Patient Instructions (Signed)
It was nice to see you today- let me know if you need anything and do please do have Dr. Kris Mouton send me a copy of your labs

## 2015-05-24 NOTE — Progress Notes (Signed)
Pre visit review using our clinic review tool, if applicable. No additional management support is needed unless otherwise documented below in the visit note. 

## 2015-05-24 NOTE — Progress Notes (Signed)
[place] [facility] [place], [place], [place] [address] [phone]  Date:  05/24/2015   Name:  Angela Gibbs   DOB:  11-18-1940   MRN:  [id number]  PCP:  Lamar Blinks, MD    Chief Complaint: New Patient (Initial Visit)   History of Present Illness:  Angela Gibbs is a 75 y.o. 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  The "nerve never healed, but it did stop hurting for which she is grateful."  She takes valsartan, bystolic for her HTN Her home BP are generally 120/70  BP Readings from Last 3 Encounters:  05/24/15 128/74  11/24/14 154/68  07/29/13 136/78    She also used prilosec for her stomach- this controls her GERD sx well She has occasional insomnia  She had a hip replacement and has "nerve damage from this operation" so she uses duragesic consistently for this and it takes care of her pain totally. If she does not have it she will "be on crutches."    She has one son and 1 daughter, she has 2 grandchildren. Her daughter is getting married this summer, her son is married and has the grandkids  She goes to [employer] [facility] once a year   Her GYN does her annual labs- she sees Dr. Matilde Bash. He will check her cholesterol and other labs and will send a copy here   Patient Active Problem List   Diagnosis Date Noted  . Abdominal pain, acute, right upper quadrant 07/29/2013    Past Medical History  Diagnosis Date  . Head injury   . Nerve damage   . Hypertension   . Migraines   . Allergy   . Herpes   . Shingles   . Concussion   . Falls     Past Surgical History   Procedure Laterality Date  . Brain surgery    . Nerve surgery    . Hip surgery      intracranial surgery  . Tonsillectomy    . Craniotomy for extracranial-intracranial bypass    . Root canal    . Eye surgery  4.4.17    milia excision    Social History  Substance Use Topics  . Smoking status: Never Smoker   . Smokeless tobacco: None  . Alcohol Use: Yes     Comment: social    Family History  Problem Relation Age of Onset  . Heart disease Father   . Heart attack Father     Allergies  Allergen Reactions  . Cephalosporins Shortness Of Breath    Breathing difficulty  . Ciprofloxacin Hives and Shortness Of Breath  . Keflex [Cephalexin] Hives  . Penicillins Hives  . Sulfacetamide Sodium Hives  . Xopenex [Levalbuterol Hcl] Anxiety and Other (See Comments)    Shaking/trembling  . Pregabalin Other (See Comments)  . Aspirin Other (See Comments)    Stomach pain/burning  . Chromium Other (See Comments)    Blisters  . Erythromycin Other (See Comments)    "stomach issues"  . Hydrochlorothiazide Other (See Comments)    Migraine  .  Influenza Vaccines Other (See Comments)    Numbness in legs  . Iodinated Diagnostic Agents     unknown  . Iodine   . Lactose Intolerance (Gi) Other (See Comments)    "stomach issues"  . Latex   . Lisinopril Cough  . Nitrofuran Derivatives Hives, Itching and Swelling  . Petrolatum Nausea Only  . Shellfish Allergy Hives  . Sulfa Antibiotics Hives    Other reaction(s): Other (See Comments) Flank pain  . Troleandomycin Other (See Comments)    Unknown  . Zostavax [Zoster Vaccine Live] Other (See Comments)    Pt has been told by specialists not to receive vaccine d/t h/o herpes in bloodstream and shingles.  . Avelox [Moxifloxacin Hcl In Nacl] Nausea Only  . Cefdinir Nausea And Vomiting  . Eggs Or Egg-Derived Products     Per allergy testing, no known reaction. Pt states she was told she could probably eat 2-3 eggs weekly w/o issue.  . Levaquin  [Levofloxacin] Hives, Palpitations and Other (See Comments)    Chest pain  . Percodan [Oxycodone-Aspirin] Nausea And Vomiting  . Prednisone Palpitations    No injections, can do the pills    Medication list has been reviewed and updated.  Current Outpatient Prescriptions on File Prior to Visit  Medication Sig Dispense Refill  . butalbital-acetaminophen-caffeine (FIORICET, ESGIC) 50-325-40 MG per tablet Take by mouth 2 (two) times daily as needed for headache.    . Cholecalciferol (D 5000) 5000 UNITS TABS daily at 0600.    Marland Kitchen co-enzyme Q-10 30 MG capsule Take 30 mg by mouth daily.    Marland Kitchen erythromycin ophthalmic ointment 1 application.     . fentaNYL (DURAGESIC - DOSED MCG/HR) 12 MCG/HR Place 12.5 mcg onto the skin every 3 (three) days.     Marland Kitchen lidocaine (LIDODERM) 5 % Place 1 patch onto the skin.     . Nebivolol HCl (BYSTOLIC PO) Take 10 mg by mouth daily.     Marland Kitchen omeprazole (PRILOSEC OTC) 20 MG tablet Take 30 mg by mouth daily.     . Prednicarbate 0.1 % CREA     . promethazine (PHENERGAN) 25 MG tablet Take 1 tablet (25 mg total) by mouth every 6 (six) hours as needed for nausea or vomiting. 30 tablet 0  . sodium chloride (V-R NASAL SPRAY SALINE) 0.65 % nasal spray Place into the nose as needed.     . valsartan (DIOVAN) 80 MG tablet Take 80 mg by mouth 2 (two) times daily.     . Ascorbic Acid (VITAMIN C) 100 MG tablet Take 1,000 mg by mouth daily. Reported on 05/24/2015    . clotrimazole-betamethasone (LOTRISONE) cream Apply 1 application topically as needed. Reported on 05/24/2015     No current facility-administered medications on file prior to visit.    Review of Systems:  As per HPI- otherwise negative.   Physical Examination: Filed Vitals:   05/24/15 1429  BP: 128/74  Pulse: 63  Temp: 97.7 F (36.5 C)    Filed Vitals:   05/24/15 1429  Height: 5\' 3"  (1.6 m)  Weight: 186 lb 12.8 oz (84.732 kg)   Body mass index is 33.1 kg/(m^2). Ideal Body Weight: Weight in (lb) to have BMI =  25: 140.8  GEN: WDWN, NAD, Non-toxic, A & O x 3, overweight, looks well HEENT: Atraumatic, Normocephalic. Neck supple. No masses, No LAD.  Bilateral TM wnl, oropharynx normal.  PEERL,EOMI.   Ears and Nose: No external deformity. CV: RRR, No M/G/R. No JVD. No thrill.  No extra heart sounds. PULM: CTA B, no wheezes, crackles, rhonchi. No retractions. No resp. distress. No accessory muscle use. ABD: S, NT, ND EXTR: No c/c/e NEURO Normal gait.  PSYCH: Normally interactive. Conversant. Not depressed or anxious appearing.  Calm demeanor.    Assessment and Plan: Benign essential hypertension  Chronic hip pain, right  BP and hip pain are well controlled with current regimen She will have labs sent to me at next visit with her OBG Will also need to get shot records from her OBG  Signed Lamar Blinks, MD

## 2015-06-08 DIAGNOSIS — R208 Other disturbances of skin sensation: Secondary | ICD-10-CM | POA: Diagnosis not present

## 2015-06-08 DIAGNOSIS — I679 Cerebrovascular disease, unspecified: Secondary | ICD-10-CM | POA: Diagnosis not present

## 2015-06-08 DIAGNOSIS — Z9181 History of falling: Secondary | ICD-10-CM | POA: Diagnosis not present

## 2015-06-26 DIAGNOSIS — M545 Low back pain: Secondary | ICD-10-CM | POA: Diagnosis not present

## 2015-06-26 DIAGNOSIS — G8929 Other chronic pain: Secondary | ICD-10-CM | POA: Diagnosis not present

## 2015-06-26 DIAGNOSIS — Z79899 Other long term (current) drug therapy: Secondary | ICD-10-CM | POA: Diagnosis not present

## 2015-06-26 DIAGNOSIS — G894 Chronic pain syndrome: Secondary | ICD-10-CM | POA: Diagnosis not present

## 2015-06-26 DIAGNOSIS — M25551 Pain in right hip: Secondary | ICD-10-CM | POA: Diagnosis not present

## 2015-06-27 DIAGNOSIS — Z124 Encounter for screening for malignant neoplasm of cervix: Secondary | ICD-10-CM | POA: Diagnosis not present

## 2015-09-10 DIAGNOSIS — H01005 Unspecified blepharitis left lower eyelid: Secondary | ICD-10-CM | POA: Diagnosis not present

## 2015-09-10 DIAGNOSIS — H01002 Unspecified blepharitis right lower eyelid: Secondary | ICD-10-CM | POA: Diagnosis not present

## 2015-09-10 DIAGNOSIS — H01001 Unspecified blepharitis right upper eyelid: Secondary | ICD-10-CM | POA: Diagnosis not present

## 2015-09-10 DIAGNOSIS — H01004 Unspecified blepharitis left upper eyelid: Secondary | ICD-10-CM | POA: Diagnosis not present

## 2015-10-02 ENCOUNTER — Other Ambulatory Visit: Payer: Self-pay | Admitting: Family Medicine

## 2015-10-08 ENCOUNTER — Other Ambulatory Visit: Payer: Self-pay | Admitting: Emergency Medicine

## 2015-10-08 MED ORDER — SALINE NASAL SPRAY 0.65 % NA SOLN: 1.0000 | NASAL | 1 refills | Status: DC | PRN

## 2015-10-10 ENCOUNTER — Telehealth: Payer: Self-pay | Admitting: Family Medicine

## 2015-10-10 NOTE — Telephone Encounter (Signed)
°  Relation to [id number]  Call back number:[phone] Pharmacy: [facility] [address] [place] - [address] [phone] (Fax)     Reason for call:  Patient requesting a refill sodium chloride (V-R NASAL SPRAY SALINE) 0.65 % nasal spray

## 2015-10-11 ENCOUNTER — Ambulatory Visit (INDEPENDENT_AMBULATORY_CARE_PROVIDER_SITE_OTHER): Payer: Medicare Other | Admitting: Family Medicine

## 2015-10-11 ENCOUNTER — Encounter: Payer: Self-pay | Admitting: Family Medicine

## 2015-10-11 ENCOUNTER — Other Ambulatory Visit: Payer: Self-pay | Admitting: Family Medicine

## 2015-10-11 ENCOUNTER — Encounter (INDEPENDENT_AMBULATORY_CARE_PROVIDER_SITE_OTHER): Payer: Self-pay

## 2015-10-11 VITALS — BP 132/82 | HR 63 | Temp 97.9°F | Ht 63.0 in | Wt 193.4 lb

## 2015-10-11 DIAGNOSIS — R600 Localized edema: Secondary | ICD-10-CM

## 2015-10-11 DIAGNOSIS — J029 Acute pharyngitis, unspecified: Secondary | ICD-10-CM

## 2015-10-11 LAB — BASIC METABOLIC PANEL
BUN: 16 mg/dL (ref 6–23)
CALCIUM: 9.1 mg/dL (ref 8.4–10.5)
CO2: 30 meq/L (ref 19–32)
Chloride: 96 mEq/L (ref 96–112)
Creatinine, Ser: 0.69 mg/dL (ref 0.40–1.20)
GFR: 88.13 mL/min (ref >60.00)
Glucose, Bld: 93 mg/dL (ref 70–99)
POTASSIUM: 4.6 meq/L (ref 3.5–5.1)
SODIUM: 131 meq/L — AB (ref 135–145)

## 2015-10-11 LAB — POCT RAPID STREP A (OFFICE): RAPID STREP A SCREEN: NEGATIVE

## 2015-10-11 MED ORDER — FUROSEMIDE 20 MG PO TABS: 20.0000 mg | ORAL_TABLET | Freq: Every day | ORAL | 1 refills | Status: DC

## 2015-10-11 MED ORDER — SALINE NASAL SPRAY 0.65 % NA SOLN: 1.0000 | NASAL | 6 refills | Status: DC | PRN

## 2015-10-11 NOTE — Patient Instructions (Addendum)
It does not appear that you have strep- you likely have allergies or a viral sore throat.  I will check on your culture however and will be in touch with you if this is positive.   You can also use lasix once a day as needed for swelling- if you use this regularly we will need to monitor your sodium and kidney function

## 2015-10-11 NOTE — Progress Notes (Signed)
[place] at [facility] [address], [place], [place] [address] [phone]  Date:  10/11/2015   Name:  Angela Gibbs   DOB:  Mar 08, 1940   MRN:  [id number]  PCP:  Lamar Blinks, MD    Chief Complaint: Sore Throat (c/o)   History of Present Illness:  Angela Gibbs is a 75 y.o. very pleasant female patient who presents with the following:  Here today with concern about ST.  She notes a red throat, first saw this yesterday afternoon She saw her dentist this am- she took 3 abx pills this am (clinda) and will take another dose this afternoon as prophylaxis for dental work She did have dental surgery last week- took 6 pills of clinda on 2 separate days for other dental work She is not aware of any fever.   No cough. She has noted a mild runny nose with clear discharge, no sneezing.  No GI symptoms.   She needs a refill of her saline nasal spray  She will have swelling in her ankles when she travels. She has seen cardiology twice and was told that all was ok with her heart.  She does not have any SOB She has been able to tolerate lasix in the past and wonder if I can give her a supply to use as needed for upcoming travel surrounding her daughter's wedding  Patient Active Problem List   Diagnosis Date Noted  . Abdominal pain, acute, right upper quadrant 07/29/2013    Past Medical History:  Diagnosis Date  . Allergy   . Concussion   . Falls   . Head injury   . Herpes   . Hypertension   . Migraines   . Nerve damage   . Shingles     Past Surgical History:  Procedure Laterality Date  . BRAIN SURGERY    . CRANIOTOMY FOR EXTRACRANIAL-INTRACRANIAL BYPASS    . EYE SURGERY  4.4.17   milia excision  . HIP SURGERY     intracranial surgery  . NERVE SURGERY    . ROOT CANAL    . TONSILLECTOMY      Social History  Substance Use Topics  . Smoking status: Never Smoker  . Smokeless tobacco: Not on file  . Alcohol use Yes      Comment: social    Family History  Problem Relation Age of Onset  . Heart disease Father   . Heart attack Father     Allergies  Allergen Reactions  . Cephalosporins Shortness Of Breath    Breathing difficulty  . Ciprofloxacin Hives and Shortness Of Breath  . Keflex [Cephalexin] Hives  . Penicillins Hives  . Sulfacetamide Sodium Hives  . Xopenex [Levalbuterol Hcl] Anxiety and Other (See Comments)    Shaking/trembling  . Pregabalin Other (See Comments)  . Aspirin Other (See Comments)    Stomach pain/burning  . Chromium Other (See Comments)    Blisters  . Erythromycin Other (See Comments)    "stomach issues"  . Hydrochlorothiazide Other (See Comments)    Migraine  . Influenza Vaccines Other (See Comments)    Numbness in legs  . Iodinated Diagnostic Agents     unknown  . Iodine   . Lactose Intolerance (Gi) Other (See Comments)    "stomach issues"  . Latex   . Lisinopril Cough  . Nitrofuran Derivatives Hives, Itching and Swelling  . Petrolatum Nausea Only  . Shellfish Allergy Hives  . Sulfa Antibiotics Hives  Other reaction(s): Other (See Comments) Flank pain  . Troleandomycin Other (See Comments)    Unknown  . Zostavax [Zoster Vaccine Live] Other (See Comments)    Pt has been told by specialists not to receive vaccine d/t h/o herpes in bloodstream and shingles.  . Avelox [Moxifloxacin Hcl In Nacl] Nausea Only  . Cefdinir Nausea And Vomiting  . Eggs Or Egg-Derived Products     Per allergy testing, no known reaction. Pt states she was told she could probably eat 2-3 eggs weekly w/o issue.  . Levaquin [Levofloxacin] Hives, Palpitations and Other (See Comments)    Chest pain  . Percodan [Oxycodone-Aspirin] Nausea And Vomiting  . Prednisone Palpitations    No injections, can do the pills    Medication list has been reviewed and updated.  Current Outpatient Prescriptions on File Prior to Visit  Medication Sig Dispense Refill  . Ascorbic Acid (VITAMIN C) 100  MG tablet Take 1,000 mg by mouth daily. Reported on 05/24/2015    . butalbital-acetaminophen-caffeine (FIORICET, ESGIC) 50-325-40 MG per tablet Take by mouth 2 (two) times daily as needed for headache.    Marland Kitchen BYSTOLIC 10 MG tablet TAKE 1 TABLET BY MOUTH EVERY DAY 90 tablet 0  . Cholecalciferol (D 5000) 5000 UNITS TABS daily at 0600.    . clotrimazole-betamethasone (LOTRISONE) cream Apply 1 application topically as needed. Reported on 05/24/2015    . co-enzyme Q-10 30 MG capsule Take 30 mg by mouth daily.    Marland Kitchen erythromycin ophthalmic ointment 1 application.     . fentaNYL (DURAGESIC - DOSED MCG/HR) 12 MCG/HR Place 12.5 mcg onto the skin every 3 (three) days.     Marland Kitchen lidocaine (LIDODERM) 5 % Place 1 patch onto the skin.     . Nebivolol HCl (BYSTOLIC PO) Take 10 mg by mouth daily.     Marland Kitchen omeprazole (PRILOSEC OTC) 20 MG tablet Take 30 mg by mouth daily.     . Prednicarbate 0.1 % CREA     . promethazine (PHENERGAN) 25 MG tablet Take 1 tablet (25 mg total) by mouth every 6 (six) hours as needed for nausea or vomiting. 30 tablet 0  . sodium chloride (V-R NASAL SPRAY SALINE) 0.65 % nasal spray Place 1 spray into the nose as needed. 30 mL 1  . valsartan (DIOVAN) 80 MG tablet Take 80 mg by mouth 2 (two) times daily.      No current facility-administered medications on file prior to visit.     Review of Systems:  As per HPI- otherwise negative.   Physical Examination: Vitals:   10/11/15 1314  BP: 132/82  Pulse: 63  Temp: 97.9 F (36.6 C)   Vitals:   10/11/15 1314  Weight: 193 lb 6.4 oz (87.7 kg)  Height: 5\' 3"  (1.6 m)   Body mass index is 34.26 kg/m. Ideal Body Weight: Weight in (lb) to have BMI = 25: 140.8  GEN: WDWN, NAD, Non-toxic, A & O x 3, obese, looks well HEENT: Atraumatic, Normocephalic. Neck supple. No masses, No LAD.  Bilateral TM wnl, oropharynx normal.  PEERL,EOMI.   Ears and Nose: No external deformity. CV: RRR, No M/G/R. No JVD. No thrill. No extra heart sounds. PULM: CTA B,  no wheezes, crackles, rhonchi. No retractions. No resp. distress. No accessory muscle use. EXTR: No c/c.  Trace edema of her BLE NEURO Normal gait.  PSYCH: Normally interactive. Conversant. Not depressed or anxious appearing.  Calm demeanor.  Results for orders placed or performed in visit on 10/11/15  POCT rapid strep A  Result Value Ref Range   Rapid Strep A Screen Negative Negative     Assessment and Plan: Sore throat - Plan: POCT rapid strep A, Culture, Group A Strep  Bilateral edema of lower extremity - Plan: Basic metabolic panel, furosemide (LASIX) 20 MG tablet, DISCONTINUED: furosemide (LASIX) 20 MG tablet  Pharyngitis- does not appear to be strep.  Reassured, conservative care ok Refilled her nasal spray Will alert her if her throat culture is postiive Check BMP today- ok to use lasix on occasion as needed for swelling  Signed Lamar Blinks, MD

## 2015-10-12 ENCOUNTER — Telehealth: Payer: Self-pay | Admitting: Family Medicine

## 2015-10-12 MED ORDER — DOXYCYCLINE HYCLATE 100 MG PO TABS: 100.0000 mg | ORAL_TABLET | Freq: Two times a day (BID) | ORAL | 0 refills | Status: DC

## 2015-10-12 NOTE — Telephone Encounter (Signed)
Called pt- she reports that she is feeling very cold- not running a fever- and that she has a "deep terrible cough" and sinus congestion.  She feels that she is ok at home, her husband can help her out.  While I suspect this is a viral illness I think she is unlikely to be willing to ride it out and will persistently request antibiotics and be unhappy if I do not rx for her now Will send in doxycycline for her- take twice a day with food and water Also discussed recent labs- let her know about mild hyponatremia.  She has not used any lasix lately.  She states she has had low sodium since her teens and that others in her family have it too.  As such it is probably benign but I urged her to use the lasix sparingly if at all

## 2015-10-12 NOTE — Telephone Encounter (Signed)
Relation to [id number] Call back [place], [place] [phone] (Phone) [phone] (Fax)     Reason for call:  Yesterday Nasal drainage was clear and now its yellow, deep cough. Requesting a RX

## 2015-10-13 LAB — CULTURE, GROUP A STREP: ORGANISM ID, BACTERIA: NORMAL

## 2015-10-15 ENCOUNTER — Encounter: Payer: Self-pay | Admitting: Family Medicine

## 2015-10-16 ENCOUNTER — Encounter: Payer: Self-pay | Admitting: Physician Assistant

## 2015-10-16 ENCOUNTER — Ambulatory Visit (INDEPENDENT_AMBULATORY_CARE_PROVIDER_SITE_OTHER): Payer: Medicare Other | Admitting: Physician Assistant

## 2015-10-16 VITALS — BP 122/74 | HR 54 | Temp 97.6°F | Resp 16 | Ht 63.0 in | Wt 192.5 lb

## 2015-10-16 DIAGNOSIS — J209 Acute bronchitis, unspecified: Secondary | ICD-10-CM

## 2015-10-16 MED ORDER — BENZONATATE 100 MG PO CAPS: 100.0000 mg | ORAL_CAPSULE | Freq: Three times a day (TID) | ORAL | 0 refills | Status: DC | PRN

## 2015-10-16 NOTE — Progress Notes (Signed)
Patient presents to clinic today c/o 5-6 days of chest congestion, chest tightness with  productive cough. Also notes sinus pressure, sinus pain and ear pressure. Was evaluated by PCP on 10/11/15 -- felt symptoms were related to a viral illness or allergies at that time. Symptoms progressed. PCP called patient 10/13/15 -- still felt viral in nature but patient was persistent so ABX was started. Patient was prescribed Doxycycline 100 mg twice daily. Has been taking medication as directed. Symptoms are starting to improve but cough is still significant, especially at night. Keeping her awake. Is requesting refill of Tessalon as she has used before with great improvement.   Past Medical History:  Diagnosis Date  . Allergy   . Concussion   . Falls   . Head injury   . Herpes   . Hypertension   . Migraines   . Nerve damage   . Shingles     Current Outpatient Prescriptions on File Prior to Visit  Medication Sig Dispense Refill  . Ascorbic Acid (VITAMIN C) 100 MG tablet Take 1,000 mg by mouth daily. Reported on 05/24/2015    . butalbital-acetaminophen-caffeine (FIORICET, ESGIC) 50-325-40 MG per tablet Take by mouth 2 (two) times daily as needed for headache.    Marland Kitchen BYSTOLIC 10 MG tablet TAKE 1 TABLET BY MOUTH EVERY DAY 90 tablet 0  . Cholecalciferol (D 5000) 5000 UNITS TABS daily at 0600.    . clotrimazole-betamethasone (LOTRISONE) cream Apply 1 application topically as needed. Reported on 05/24/2015    . co-enzyme Q-10 30 MG capsule Take 30 mg by mouth daily.    Marland Kitchen doxycycline (VIBRA-TABS) 100 MG tablet Take 1 tablet (100 mg total) by mouth 2 (two) times daily. 20 tablet 0  . erythromycin ophthalmic ointment 1 application.     . fentaNYL (DURAGESIC - DOSED MCG/HR) 12 MCG/HR Place 12.5 mcg onto the skin every 3 (three) days.     . furosemide (LASIX) 20 MG tablet Take 1 tablet (20 mg total) by mouth daily. Use on occasion as needed for swelling 30 tablet 1  . furosemide (LASIX) 20 MG tablet TAKE 1  TABLET BY MOUTH EVERY DAY. USE ON OCCASION AS NEEDED FOR SWELLING 90 tablet 1  . lidocaine (LIDODERM) 5 % Place 1 patch onto the skin.     . Nebivolol HCl (BYSTOLIC PO) Take 10 mg by mouth daily.     Marland Kitchen omeprazole (PRILOSEC OTC) 20 MG tablet Take 30 mg by mouth daily.     . Prednicarbate 0.1 % CREA     . promethazine (PHENERGAN) 25 MG tablet Take 1 tablet (25 mg total) by mouth every 6 (six) hours as needed for nausea or vomiting. 30 tablet 0  . sodium chloride (V-R NASAL SPRAY SALINE) 0.65 % nasal spray Place 1 spray into the nose as needed. 30 mL 6  . valsartan (DIOVAN) 80 MG tablet Take 80 mg by mouth 2 (two) times daily.      No current facility-administered medications on file prior to visit.     Allergies  Allergen Reactions  . Cephalosporins Shortness Of Breath    Breathing difficulty  . Ciprofloxacin Hives and Shortness Of Breath  . Keflex [Cephalexin] Hives  . Penicillins Hives  . Sulfacetamide Sodium Hives  . Xopenex [Levalbuterol Hcl] Anxiety and Other (See Comments)    Shaking/trembling  . Pregabalin Other (See Comments)  . Aspirin Other (See Comments)    Stomach pain/burning  . Chromium Other (See Comments)    Blisters  .  Erythromycin Other (See Comments)    "stomach issues"  . Hydrochlorothiazide Other (See Comments)    Migraine  . Influenza Vaccines Other (See Comments)    Numbness in legs  . Iodinated Diagnostic Agents     unknown  . Iodine   . Lactose Intolerance (Gi) Other (See Comments)    "stomach issues"  . Latex   . Lisinopril Cough  . Nitrofuran Derivatives Hives, Itching and Swelling  . Petrolatum Nausea Only  . Shellfish Allergy Hives  . Sulfa Antibiotics Hives    Other reaction(s): Other (See Comments) Flank pain  . Troleandomycin Other (See Comments)    Unknown  . Zostavax [Zoster Vaccine Live] Other (See Comments)    Pt has been told by specialists not to receive vaccine d/t h/o herpes in bloodstream and shingles.  . Avelox [Moxifloxacin  Hcl In Nacl] Nausea Only  . Cefdinir Nausea And Vomiting  . Eggs Or Egg-Derived Products     Per allergy testing, no known reaction. Pt states she was told she could probably eat 2-3 eggs weekly w/o issue.  . Levaquin [Levofloxacin] Hives, Palpitations and Other (See Comments)    Chest pain  . Percodan [Oxycodone-Aspirin] Nausea And Vomiting  . Prednisone Palpitations    No injections, can do the pills    Family History  Problem Relation Age of Onset  . Heart disease Father   . Heart attack Father     Social History   Social History  . Marital status: Married    Spouse name: N/A  . Number of children: N/A  . Years of education: N/A   Social History Main Topics  . Smoking status: Never Smoker  . Smokeless tobacco: Not on file  . Alcohol use Yes     Comment: social  . Drug use: No  . Sexual activity: Not on file   Other Topics Concern  . Not on file   Social History Narrative  . No narrative on file    Review of Systems - See HPI.  All other ROS are negative.  BP 122/74 (BP Location: Right Arm, Patient Position: Sitting, Cuff Size: Large)   Pulse (!) 54   Temp 97.6 F (36.4 C) (Oral)   Resp 16   Ht 5\' 3"  (1.6 m)   Wt 192 lb 8 oz (87.3 kg)   SpO2 98%   BMI 34.10 kg/m   Physical Exam  Constitutional: She is oriented to person, place, and time and well-developed, well-nourished, and in no distress.  HENT:  Head: Normocephalic and atraumatic.  Right Ear: External ear normal.  Left Ear: External ear normal.  Nose: Nose normal.  Mouth/Throat: Oropharynx is clear and moist. No oropharyngeal exudate.  Eyes: Conjunctivae are normal.  Neck: Neck supple.  Cardiovascular: Normal rate, regular rhythm, normal heart sounds and intact distal pulses.   Pulmonary/Chest: Effort normal and breath sounds normal. No respiratory distress. She has no wheezes. She has no rales. She exhibits no tenderness.  Neurological: She is alert and oriented to person, place, and time.    Skin: Skin is warm and dry. No rash noted.  Psychiatric: Affect normal.  Vitals reviewed.   Recent Results (from the past 2160 hour(s))  POCT rapid strep A     Status: Normal   Collection Time: 10/11/15  1:24 PM  Result Value Ref Range   Rapid Strep A Screen Negative Negative  Basic metabolic panel     Status: Abnormal   Collection Time: 10/11/15  1:41 PM  Result  Value Ref Range   Sodium 131 (L) 135 - 145 mEq/L   Potassium 4.6 3.5 - 5.1 mEq/L   Chloride 96 96 - 112 mEq/L   CO2 30 19 - 32 mEq/L   Glucose, Bld 93 70 - 99 mg/dL   BUN 16 6 - 23 mg/dL   Creatinine, Ser 0.69 0.40 - 1.20 mg/dL   Calcium 9.1 8.4 - 10.5 mg/dL   GFR 88.13 >60.00 mL/min  Culture, Group A Strep     Status: None   Collection Time: 10/11/15  3:35 PM  Result Value Ref Range   Organism ID, Bacteria Normal Upper Respiratory Flora    Organism ID, Bacteria No Beta Hemolytic Streptococci Isolated     Assessment/Plan: 1. Acute bronchitis, unspecified organism Continue antibiotic given by PCP. Symptoms improving. Supportive measures reviewed. Tessalon refilled. Patient to take as directed.   Leeanne Rio, PA-C

## 2015-10-16 NOTE — Patient Instructions (Addendum)
Please finish antibiotic given by Dr. Lorelei Pont. Stay well hydrated and get plenty of rest. Continue chronic medications. Start the [place] and take as directed for cough.  Follow-up with your primary care if symptoms do not continue. To resolve.

## 2015-10-24 ENCOUNTER — Ambulatory Visit: Payer: Medicare Other | Admitting: Family Medicine

## 2015-10-25 DIAGNOSIS — G8929 Other chronic pain: Secondary | ICD-10-CM | POA: Diagnosis not present

## 2015-10-25 DIAGNOSIS — M545 Low back pain: Secondary | ICD-10-CM | POA: Diagnosis not present

## 2015-10-25 DIAGNOSIS — G894 Chronic pain syndrome: Secondary | ICD-10-CM | POA: Diagnosis not present

## 2015-10-25 DIAGNOSIS — G2581 Restless legs syndrome: Secondary | ICD-10-CM | POA: Diagnosis not present

## 2015-10-25 DIAGNOSIS — B0229 Other postherpetic nervous system involvement: Secondary | ICD-10-CM | POA: Diagnosis not present

## 2015-10-29 DIAGNOSIS — L400 Psoriasis vulgaris: Secondary | ICD-10-CM | POA: Diagnosis not present

## 2015-12-04 ENCOUNTER — Telehealth: Payer: Self-pay | Admitting: Emergency Medicine

## 2015-12-05 ENCOUNTER — Other Ambulatory Visit: Payer: Self-pay | Admitting: Emergency Medicine

## 2015-12-05 MED ORDER — SALINE NASAL SPRAY 0.65 % NA SOLN: 1.0000 | NASAL | 3 refills | Status: DC | PRN

## 2015-12-05 NOTE — Telephone Encounter (Signed)
Called pharmacy back. They needed approval for nasal spray refill. Approved nasal spray refill with 3 refills.

## 2015-12-05 NOTE — Telephone Encounter (Signed)
Pharmacy called in to follow up on a refill request for pt's nasal spray.     [place] [phone]

## 2015-12-06 ENCOUNTER — Encounter: Payer: Self-pay | Admitting: Family Medicine

## 2015-12-07 ENCOUNTER — Other Ambulatory Visit: Payer: Self-pay | Admitting: Emergency Medicine

## 2015-12-07 MED ORDER — VALSARTAN 80 MG PO TABS: 80.0000 mg | ORAL_TABLET | Freq: Two times a day (BID) | ORAL | 2 refills | Status: DC

## 2015-12-07 MED ORDER — VALSARTAN 80 MG PO TABS
80.0000 mg | ORAL_TABLET | Freq: Two times a day (BID) | ORAL | 2 refills | Status: DC
Start: 2015-12-07 — End: 2015-12-07

## 2015-12-20 ENCOUNTER — Encounter: Payer: Self-pay | Admitting: Family Medicine

## 2015-12-20 ENCOUNTER — Ambulatory Visit (INDEPENDENT_AMBULATORY_CARE_PROVIDER_SITE_OTHER): Payer: Medicare Other | Admitting: Medical

## 2015-12-20 VITALS — BP 145/85 | HR 56 | Temp 98.0°F | Wt 189.0 lb

## 2015-12-20 DIAGNOSIS — J0100 Acute maxillary sinusitis, unspecified: Secondary | ICD-10-CM | POA: Diagnosis not present

## 2015-12-20 DIAGNOSIS — J209 Acute bronchitis, unspecified: Secondary | ICD-10-CM | POA: Diagnosis not present

## 2015-12-20 DIAGNOSIS — R05 Cough: Secondary | ICD-10-CM

## 2015-12-20 DIAGNOSIS — R062 Wheezing: Secondary | ICD-10-CM | POA: Diagnosis not present

## 2015-12-20 DIAGNOSIS — R059 Cough, unspecified: Secondary | ICD-10-CM

## 2015-12-20 MED ORDER — BENZONATATE 100 MG PO CAPS: 100.0000 mg | ORAL_CAPSULE | Freq: Three times a day (TID) | ORAL | 0 refills | Status: DC | PRN

## 2015-12-20 MED ORDER — DOXYCYCLINE HYCLATE 100 MG PO TABS: 100.0000 mg | ORAL_TABLET | Freq: Two times a day (BID) | ORAL | 0 refills | Status: DC

## 2015-12-20 NOTE — Progress Notes (Signed)
Subjective:    Patient ID: Angela Gibbs, female    DOB: 30-May-1940, 75 y.o.   MRN: [id number]  HPI  Pt in states she thinks has sinus infection. Pt states mild hoarse voice, rt ear pain, nasal congestion and sinus pain. Pt states when she blows her nose get yellow colored mucous. Pt had symptoms for about 7 days. Pt husband sick first and he had sinus infection and bronchitis.   Pt had no fever, no chills or sweats. Mild fatigue. No body aches. Pt has some faint wheezing. She has albuterol for wheezing. Pt has palpitations with injection steroids. And reflux flare with oral prednisone.  Pt can tolerate doxycycline and can tolerate benzonatate.  Pt states mostly just wheezing with exercise.   Review of Systems  Constitutional: Negative for chills, fatigue and fever.  HENT: Positive for congestion and sinus pressure. Negative for drooling, ear pain, mouth sores, nosebleeds, postnasal drip, sneezing and sore throat.   Respiratory: Positive for cough and wheezing. Negative for chest tightness and shortness of breath.        See hpi.  Cardiovascular: Negative for chest pain and palpitations.  Gastrointestinal: Negative for abdominal pain, constipation, nausea and vomiting.  Musculoskeletal: Negative for back pain, joint swelling, myalgias, neck pain and neck stiffness.  Skin: Negative for rash.  Neurological: Negative for dizziness and headaches.  Hematological: Negative for adenopathy. Does not bruise/bleed easily.  Psychiatric/Behavioral: Negative for behavioral problems, confusion and dysphoric mood. The patient is not nervous/anxious.     Past Medical History:  Diagnosis Date  . Allergy   . Concussion   . Falls   . Head injury   . Herpes   . Hypertension   . Migraines   . Nerve damage   . Shingles      Social History   Social History  . Marital status: Married    Spouse name: N/A  . Number of children: N/A  . Years of education: N/A   Occupational History  . Not  on file.   Social History Main Topics  . Smoking status: Never Smoker  . Smokeless tobacco: Not on file  . Alcohol use Yes     Comment: social  . Drug use: No  . Sexual activity: Not on file   Other Topics Concern  . Not on file   Social History Narrative  . No narrative on file    Past Surgical History:  Procedure Laterality Date  . BRAIN SURGERY    . CRANIOTOMY FOR EXTRACRANIAL-INTRACRANIAL BYPASS    . EYE SURGERY  4.4.17   milia excision  . HIP SURGERY     intracranial surgery  . NERVE SURGERY    . ROOT CANAL    . TONSILLECTOMY      Family History  Problem Relation Age of Onset  . Heart disease Father   . Heart attack Father     Allergies  Allergen Reactions  . Cephalosporins Shortness Of Breath    Breathing difficulty  . Ciprofloxacin Hives and Shortness Of Breath  . Keflex [Cephalexin] Hives  . Penicillins Hives  . Sulfacetamide Sodium Hives  . Xopenex [Levalbuterol Hcl] Anxiety and Other (See Comments)    Shaking/trembling  . Pregabalin Other (See Comments)  . Aspirin Other (See Comments)    Stomach pain/burning  . Chromium Other (See Comments)    Blisters  . Erythromycin Other (See Comments)    "stomach issues"  . Hydrochlorothiazide Other (See Comments)    Migraine  . Influenza Vaccines  Other (See Comments)    Numbness in legs  . Iodinated Diagnostic Agents     unknown  . Iodine   . Lactose Intolerance (Gi) Other (See Comments)    "stomach issues"  . Latex   . Lisinopril Cough  . Nitrofuran Derivatives Hives, Itching and Swelling  . Petrolatum Nausea Only  . Shellfish Allergy Hives  . Sulfa Antibiotics Hives    Other reaction(s): Other (See Comments) Flank pain  . Troleandomycin Other (See Comments)    Unknown  . Zostavax [Zoster Vaccine Live] Other (See Comments)    Pt has been told by specialists not to receive vaccine d/t h/o herpes in bloodstream and shingles.  . Avelox [Moxifloxacin Hcl In Nacl] Nausea Only  . Cefdinir Nausea And  Vomiting  . Eggs Or Egg-Derived Products     Per allergy testing, no known reaction. Pt states she was told she could probably eat 2-3 eggs weekly w/o issue.  . Levaquin [Levofloxacin] Hives, Palpitations and Other (See Comments)    Chest pain  . Percodan [Oxycodone-Aspirin] Nausea And Vomiting  . Prednisone Palpitations    No injections, can do the pills    Current Outpatient Prescriptions on File Prior to Visit  Medication Sig Dispense Refill  . Ascorbic Acid (VITAMIN C) 100 MG tablet Take 1,000 mg by mouth daily. Reported on 05/24/2015    . benzonatate (TESSALON) 100 MG capsule Take 1 capsule (100 mg total) by mouth 3 (three) times daily as needed. 30 capsule 0  . butalbital-acetaminophen-caffeine (FIORICET, ESGIC) 50-325-40 MG per tablet Take by mouth 2 (two) times daily as needed for headache.    Marland Kitchen BYSTOLIC 10 MG tablet TAKE 1 TABLET BY MOUTH EVERY DAY 90 tablet 0  . Cholecalciferol (D 5000) 5000 UNITS TABS daily at 0600.    . clotrimazole-betamethasone (LOTRISONE) cream Apply 1 application topically as needed. Reported on 05/24/2015    . co-enzyme Q-10 30 MG capsule Take 30 mg by mouth daily.    Marland Kitchen doxycycline (VIBRA-TABS) 100 MG tablet Take 1 tablet (100 mg total) by mouth 2 (two) times daily. 20 tablet 0  . erythromycin ophthalmic ointment 1 application.     . fentaNYL (DURAGESIC - DOSED MCG/HR) 12 MCG/HR Place 12.5 mcg onto the skin every 3 (three) days.     . furosemide (LASIX) 20 MG tablet Take 1 tablet (20 mg total) by mouth daily. Use on occasion as needed for swelling 30 tablet 1  . furosemide (LASIX) 20 MG tablet TAKE 1 TABLET BY MOUTH EVERY DAY. USE ON OCCASION AS NEEDED FOR SWELLING 90 tablet 1  . lidocaine (LIDODERM) 5 % Place 1 patch onto the skin.     . Nebivolol HCl (BYSTOLIC PO) Take 10 mg by mouth daily.     Marland Kitchen omeprazole (PRILOSEC OTC) 20 MG tablet Take 30 mg by mouth daily.     . Prednicarbate 0.1 % CREA     . promethazine (PHENERGAN) 25 MG tablet Take 1 tablet (25 mg  total) by mouth every 6 (six) hours as needed for nausea or vomiting. 30 tablet 0  . sodium chloride (V-R NASAL SPRAY SALINE) 0.65 % nasal spray Place 1 spray into the nose as needed. 30 mL 3  . valsartan (DIOVAN) 80 MG tablet Take 1 tablet (80 mg total) by mouth 2 (two) times daily. 60 tablet 2   No current facility-administered medications on file prior to visit.     BP (!) 172/72 (BP Location: Left Arm, Patient Position: Sitting, Cuff Size:  Normal)   Pulse (!) 56   Temp 98 F (36.7 C) (Oral)   Wt 189 lb (85.7 kg)   SpO2 96%   BMI 33.48 kg/m       Objective:   Physical Exam   General  Mental Status - Alert. General Appearance - Well groomed. Not in acute distress.  Skin Rashes- No Rashes.  HEENT Head- Normal. Ear Auditory Canal - Left- Normal. Right - Normal.Tympanic Membrane- Left- Normal. Right- Normal. Eye Sclera/Conjunctiva- Left- Normal. Right- Normal. Nose & Sinuses Nasal Mucosa- Left-  Boggy and Congested. Right-  Boggy and  Congested.Bilateral  maxillary and frontal sinus pressure. Mouth & Throat Lips: Upper Lip- Normal: no dryness, cracking, pallor, cyanosis, or vesicular eruption. Lower Lip-Normal: no dryness, cracking, pallor, cyanosis or vesicular eruption. Buccal Mucosa- Bilateral- No Aphthous ulcers. Oropharynx- No Discharge or Erythema. +pnd Tonsils: Characteristics- Bilateral- No Erythema or Congestion. Size/Enlargement- Bilateral- No enlargement. Discharge- bilateral-None.  Neck Neck- Supple. No Masses.   Chest and Lung Exam Auscultation: Breath Sounds:-Clear even and unlabored.  Cardiovascular Auscultation:Rythm- Regular, rate and rhythm. Murmurs & Other Heart Sounds:Ausculatation of the heart reveal- No Murmurs.  Lymphatic Head & Neck General Head & Neck Lymphatics: Bilateral: Description- No Localized lymphadenopathy.      Assessment & Plan:  You appear to have bronchitis and sinusitis(more sinusitis). Rest hydrate and tylenol for  fever. I am prescribing cough medicine benzonatate, and doxycycline antibiotic. For your nasal congestion use your nasal spray  You should gradually get better. If not then notify us and would recommend a chest xray.(currently sounds clear)  If you wheeze then use albuterol inhaler. But if using very frequently/every 6 hours the contact pcp since you may need steroid inhaler or tapered prednisone.  Follow up in 7-10 days or as needed  Pt states 100% assurity has used benzonate and knows can take. No reaction per pt. Also not on allergy list.  Nesanel Aguila, Percell Miller, PA-C

## 2015-12-20 NOTE — Progress Notes (Signed)
Pre visit review using our clinic review tool, if applicable. No additional management support is needed unless otherwise documented below in the visit note. 

## 2015-12-20 NOTE — Patient Instructions (Addendum)
You appear to have bronchitis and sinusitis(more sinusitis). Rest hydrate and tylenol for fever. I am prescribing cough medicine benzonatate, and doxycycline antibiotic. For your nasal congestion use your nasal spray  You should gradually get better. If not then notify us and would recommend a chest xray.(currently sounds clear)  If you wheeze then use albuterol inhaler. But if using very frequently/every 6 hours the contact pcp since you may need steroid inhaler or tapered prednisone.  Follow up in 7-10 days or as needed

## 2016-01-21 DIAGNOSIS — G8929 Other chronic pain: Secondary | ICD-10-CM | POA: Diagnosis not present

## 2016-01-21 DIAGNOSIS — M545 Low back pain: Secondary | ICD-10-CM | POA: Diagnosis not present

## 2016-01-21 DIAGNOSIS — G2581 Restless legs syndrome: Secondary | ICD-10-CM | POA: Diagnosis not present

## 2016-01-21 DIAGNOSIS — B0229 Other postherpetic nervous system involvement: Secondary | ICD-10-CM | POA: Diagnosis not present

## 2016-01-21 DIAGNOSIS — G894 Chronic pain syndrome: Secondary | ICD-10-CM | POA: Diagnosis not present

## 2016-02-05 ENCOUNTER — Encounter: Payer: Self-pay | Admitting: Family Medicine

## 2016-02-05 MED ORDER — VALSARTAN 80 MG PO TABS: 80.0000 mg | ORAL_TABLET | Freq: Two times a day (BID) | ORAL | 3 refills | Status: DC

## 2016-02-08 IMAGING — CR DG LUMBAR SPINE COMPLETE 4+V
5 series · 5 of 5 positions shown · non-contrast
Comparison: None.

CLINICAL DATA: Pain post fall

EXAM:
LUMBAR SPINE - COMPLETE 4+ VIEW

[t l-spine lat]
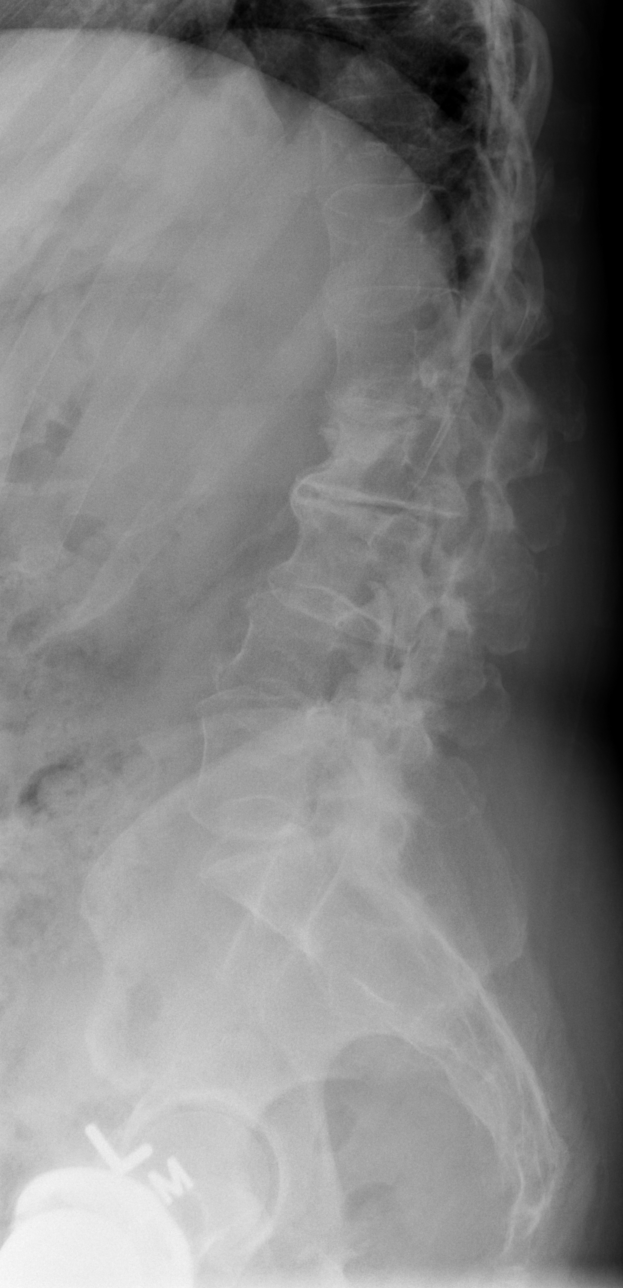

[t l-spine l5-s1 spot]
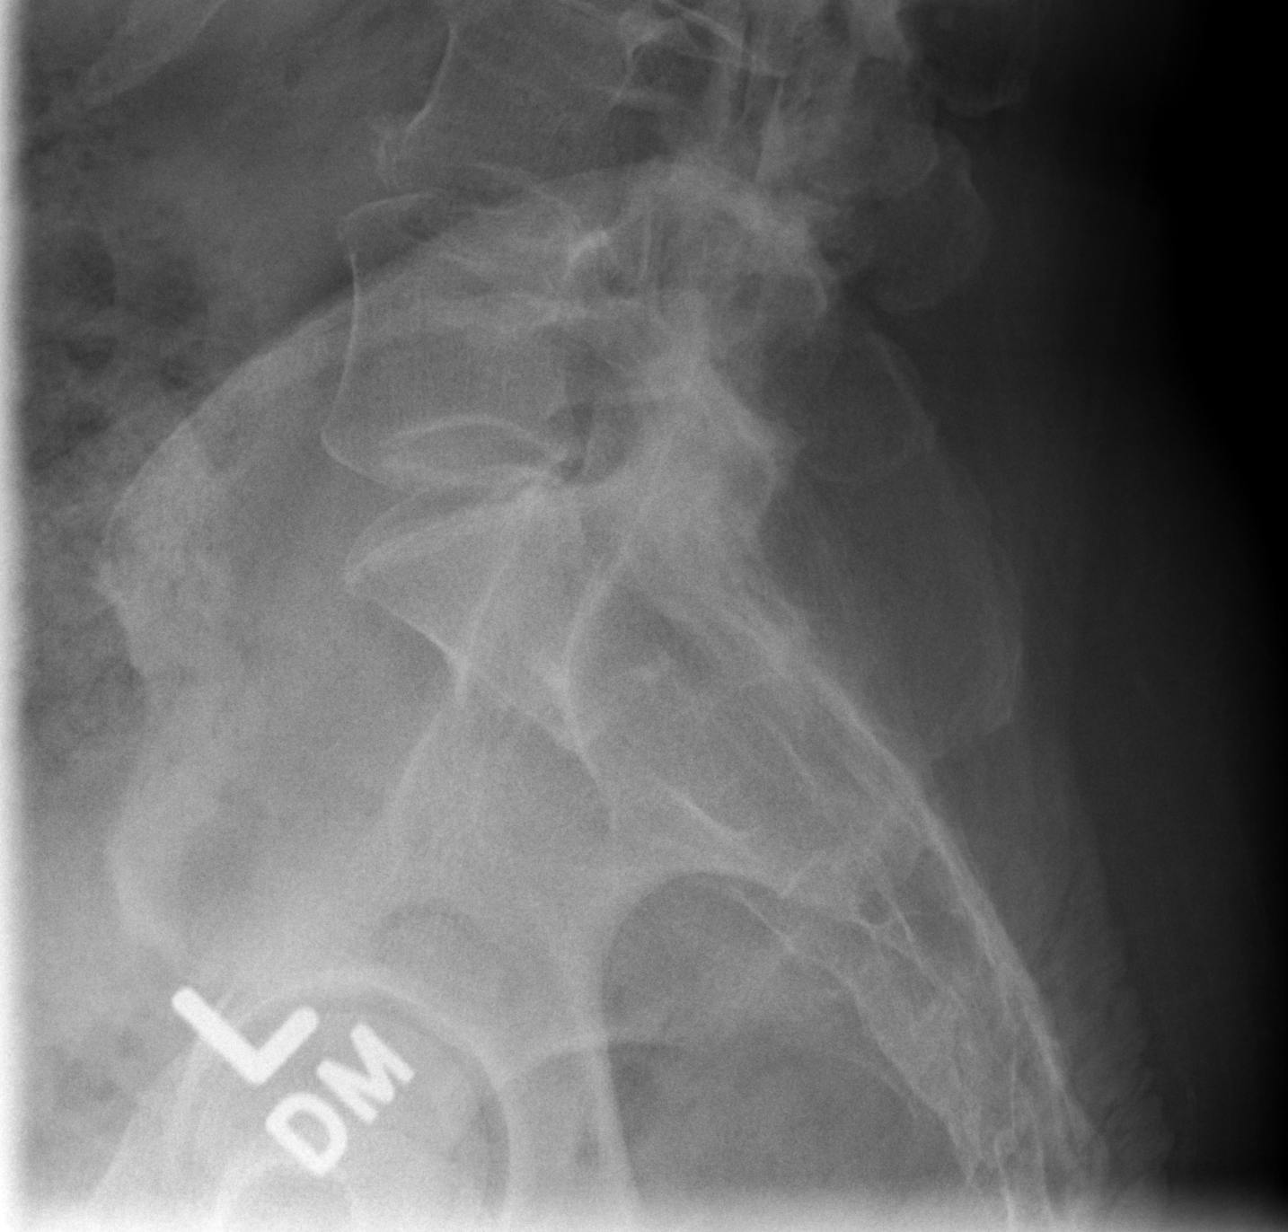

[t l-spine a.p.]
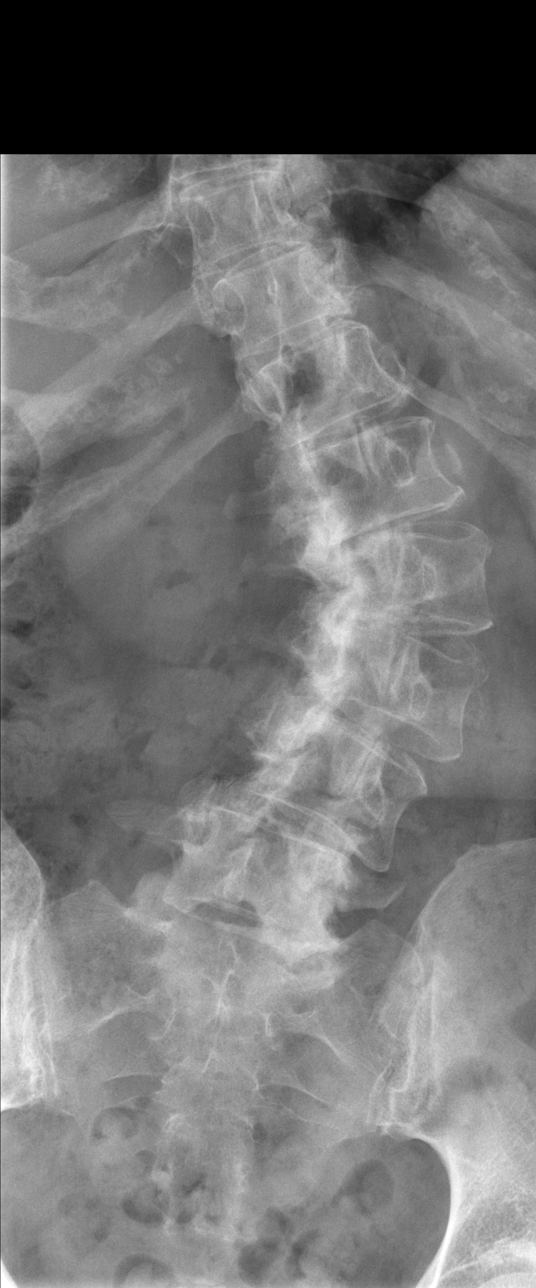

[t l-spine oblique exposure (1 of 2)]
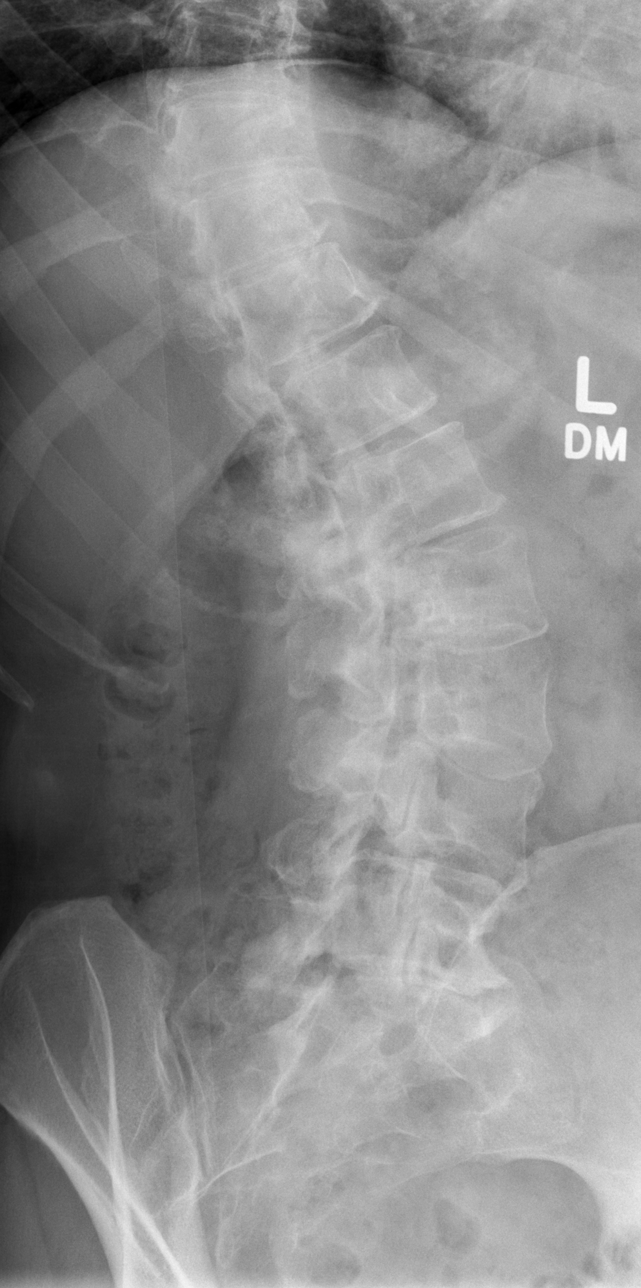

[t l-spine oblique exposure (2 of 2)]
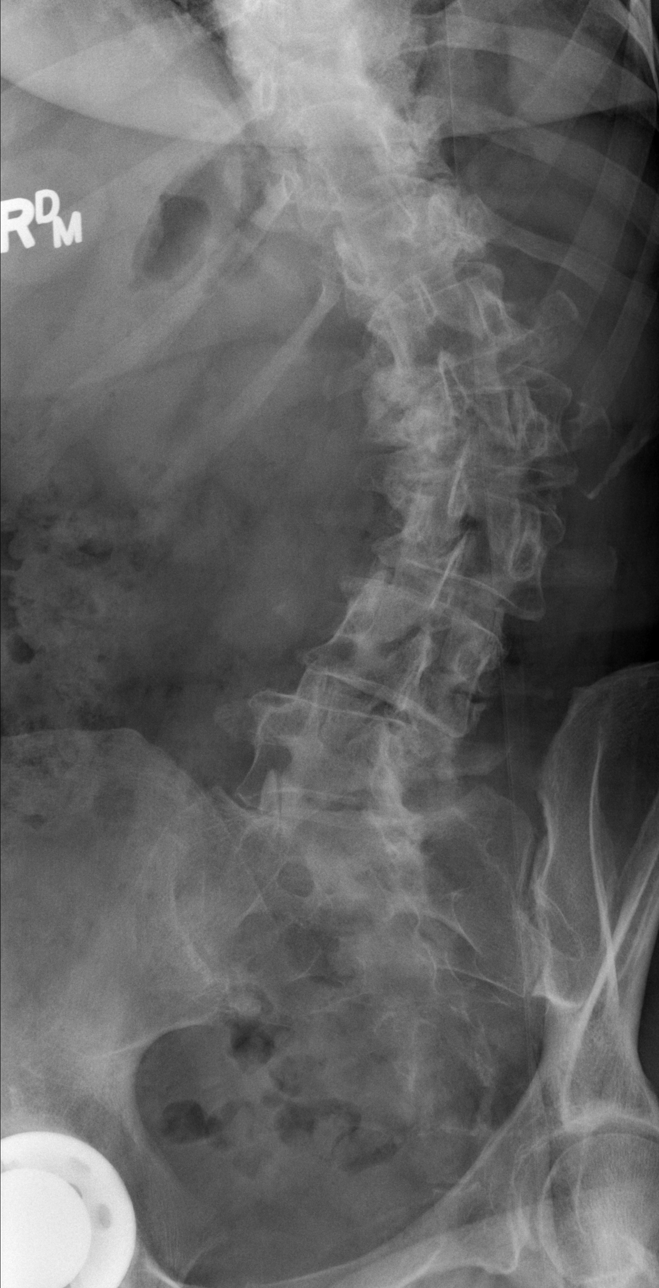

[5 of 5 positions shown; findings below may reference images not displayed]

FINDINGS: Thoracolumbar scoliosis, apex to the left at L2. Spondylitic changes
L1- S1. No definite fracture or dislocation. Right hip prosthesis
partially visualized.
IMPRESSION: 1. Negative for fracture or other acute bone abnormality.
2. Thoracolumbar scoliosis with multilevel degenerative changes.

## 2016-02-08 IMAGING — CR DG RIBS W/ CHEST 3+V*L*
3 series · 3 of 3 positions shown · non-contrast
Comparison: 02/04/2011

CLINICAL DATA: Pain post fall

EXAM:
LEFT RIBS AND CHEST - 3+ VIEW

[w chest pa]
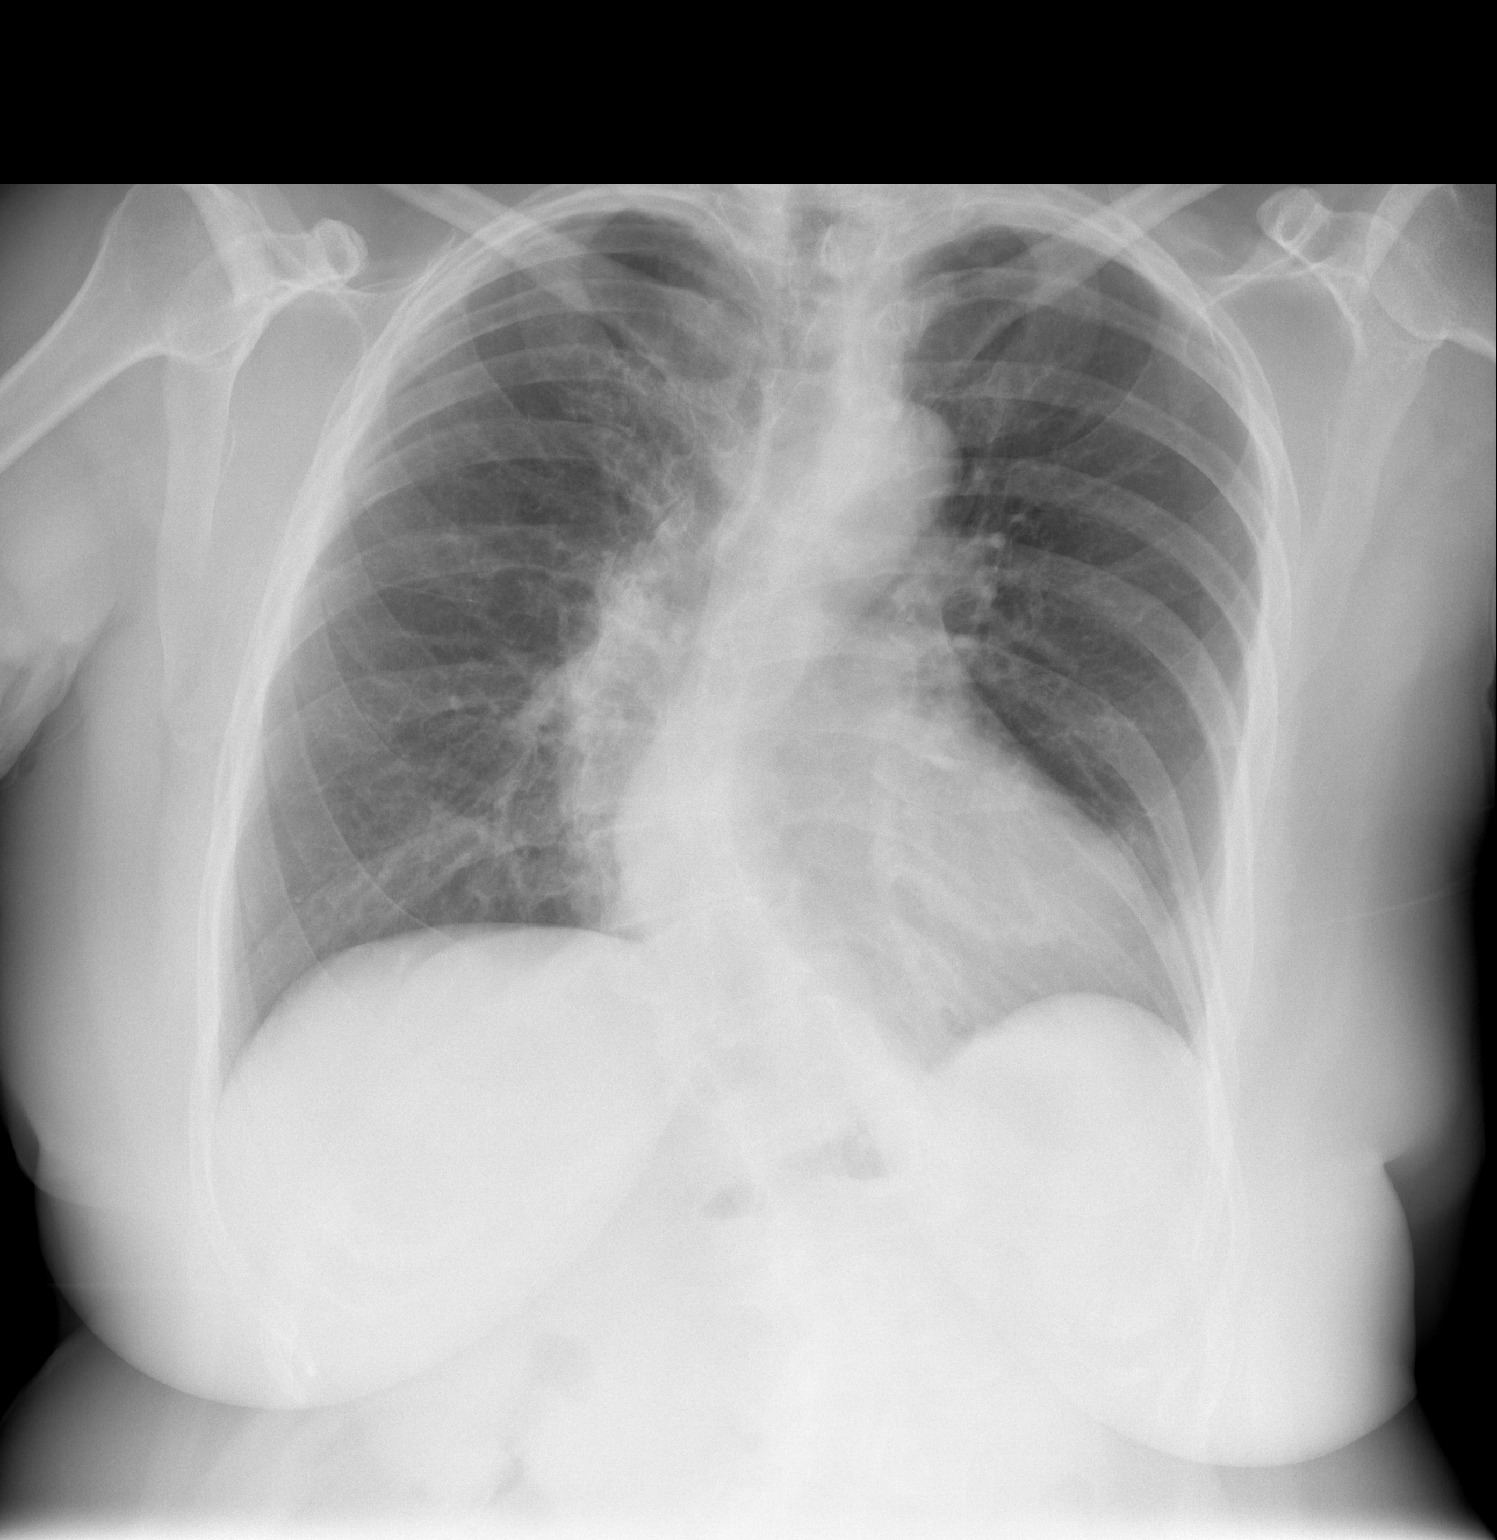

[w ribs ap/pa upper left]
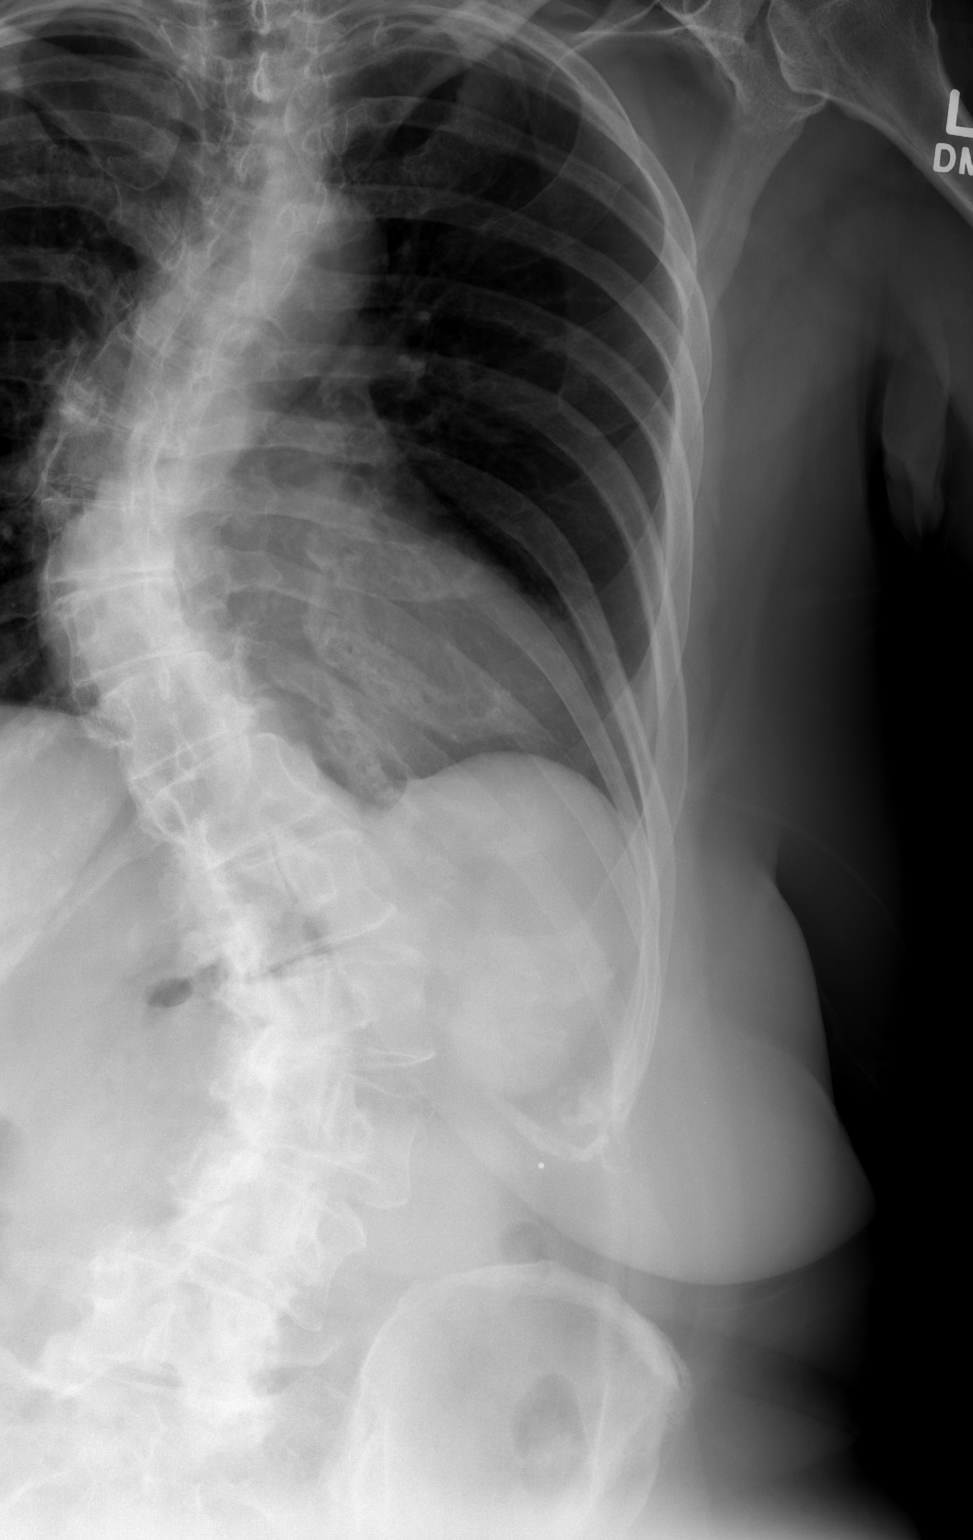

[w ribs oblique left]
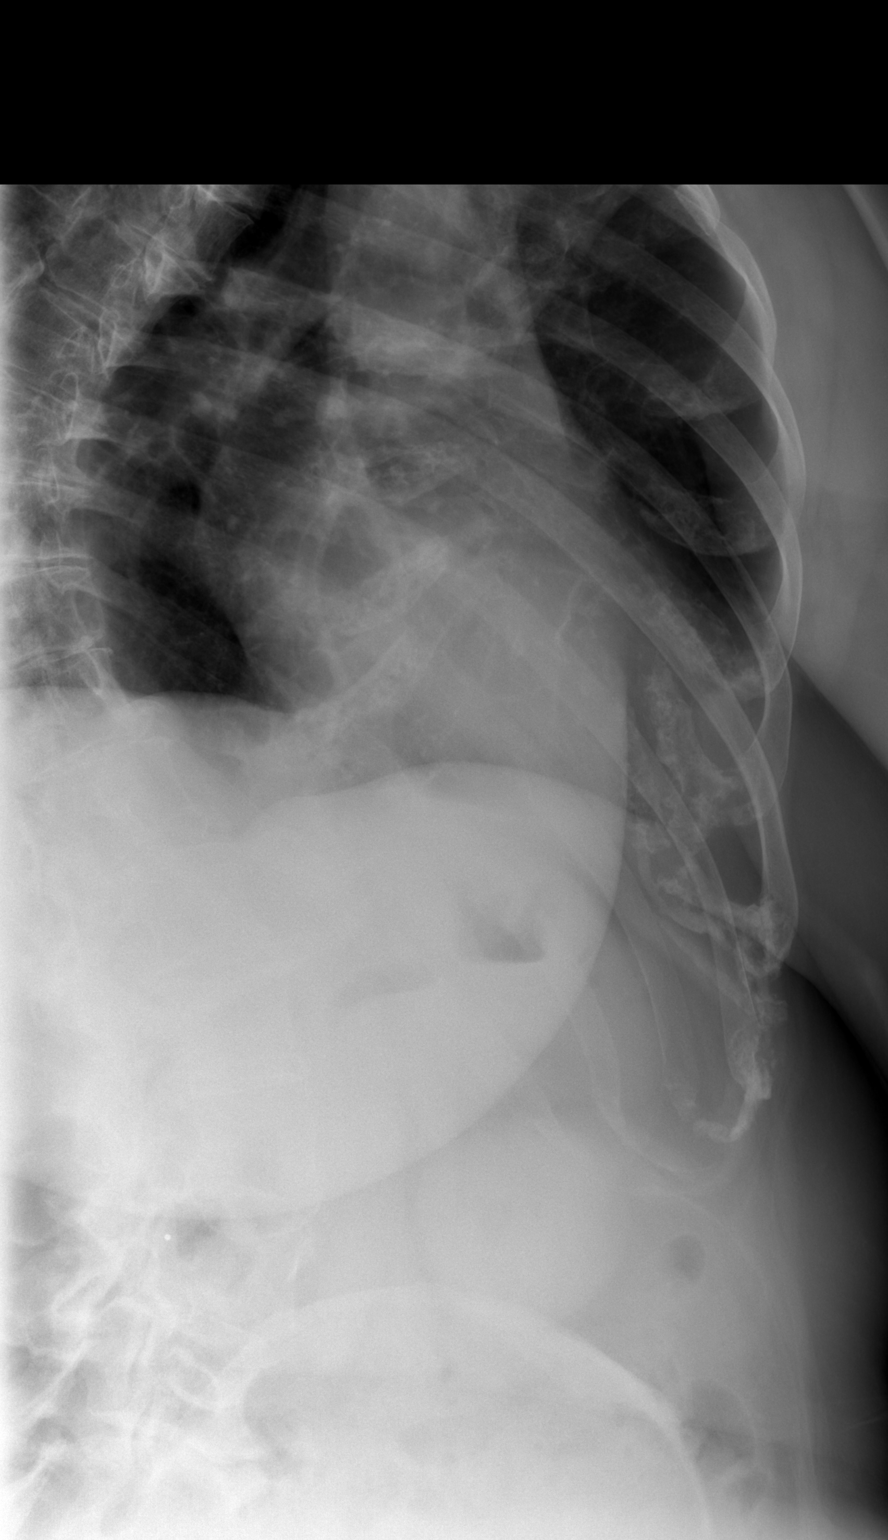

[3 of 3 positions shown; findings below may reference images not displayed]

FINDINGS: Lungs are clear. Heart size upper limits normal.  No effusion.
Thoracolumbar scoliosis, apex to right T8-9, to the left L1-2,
without evident underlying vertebral anomaly. No pneumothorax. No
displaced rib fracture on detail views.
IMPRESSION: 1. Negative for displaced rib fracture, pneumothorax or pleural
effusion.
2. Thoracolumbar scoliosis.

## 2016-02-08 IMAGING — CR DG PELVIS 1-2V
1 series · 1 of 1 positions shown · non-contrast
Comparison: None.

CLINICAL DATA: Pain post fall

EXAM:
PELVIS - 1-2 VIEW

[t pelvis a.p.]
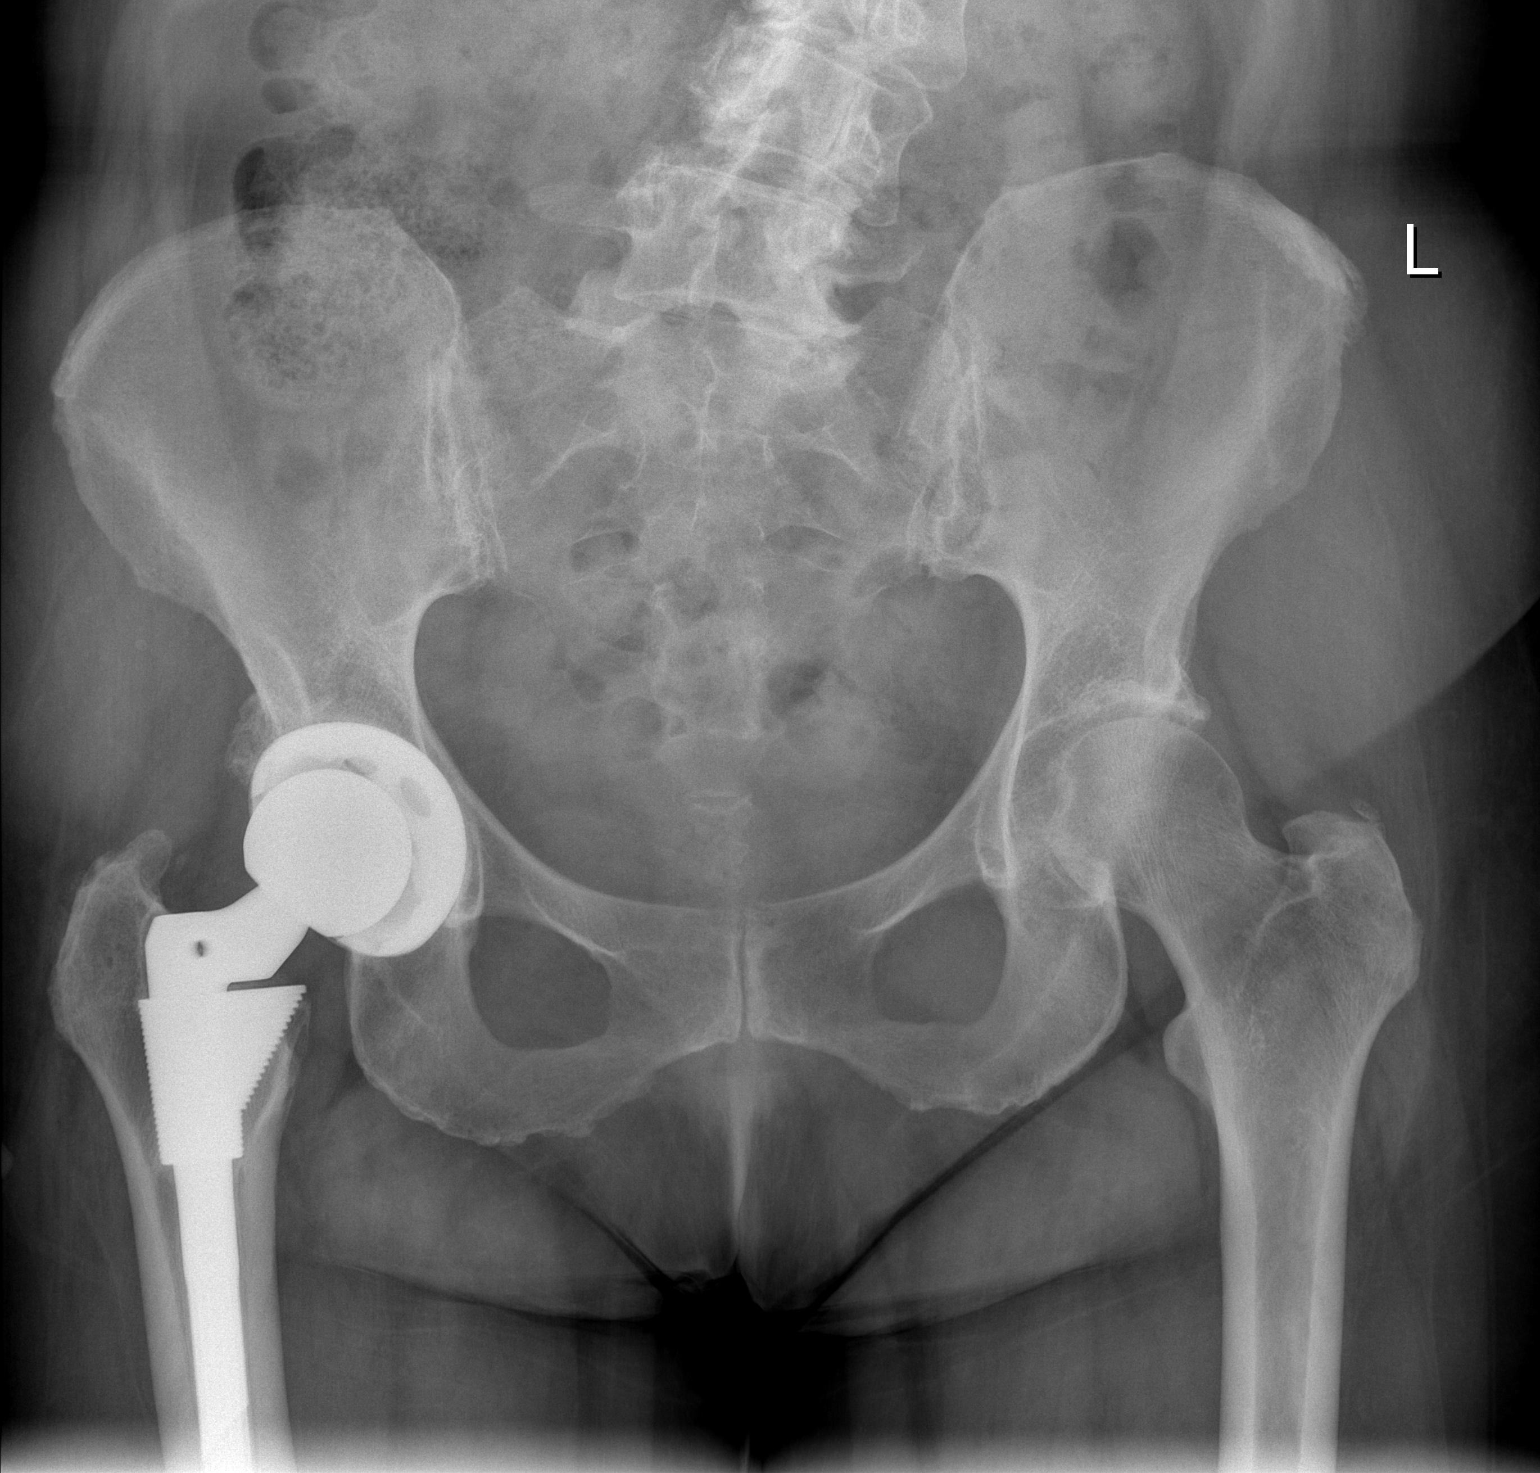

[1 of 1 positions shown; findings below may reference images not displayed]

FINDINGS: Right hip arthroplasty components, and distal aspect of stem not
visualized. No fracture or dislocation. Spurring from the superior
margin left acetabulum. Lumbar levoscoliosis with multilevel
degenerative changes. Osteitis pubis.
IMPRESSION: 1. Negative for fracture or other acute bone abnormality.
2. Postoperative and degenerative changes as above.

## 2016-02-20 ENCOUNTER — Encounter: Payer: Self-pay | Admitting: Family Medicine

## 2016-02-20 ENCOUNTER — Ambulatory Visit (INDEPENDENT_AMBULATORY_CARE_PROVIDER_SITE_OTHER): Payer: Medicare Other | Admitting: Family Medicine

## 2016-02-20 VITALS — BP 144/82 | HR 87 | Temp 98.0°F | Ht 63.0 in | Wt 190.0 lb

## 2016-02-20 DIAGNOSIS — Z13220 Encounter for screening for lipoid disorders: Secondary | ICD-10-CM

## 2016-02-20 DIAGNOSIS — Z130 Encounter for screening for diseases of the blood and blood-forming organs and certain disorders involving the immune mechanism: Secondary | ICD-10-CM | POA: Diagnosis not present

## 2016-02-20 DIAGNOSIS — Z5181 Encounter for therapeutic drug level monitoring: Secondary | ICD-10-CM

## 2016-02-20 DIAGNOSIS — I10 Essential (primary) hypertension: Secondary | ICD-10-CM

## 2016-02-20 DIAGNOSIS — Z23 Encounter for immunization: Secondary | ICD-10-CM | POA: Diagnosis not present

## 2016-02-20 DIAGNOSIS — E669 Obesity, unspecified: Secondary | ICD-10-CM

## 2016-02-20 HISTORY — DX: Essential (primary) hypertension: I10

## 2016-02-20 LAB — COMPREHENSIVE METABOLIC PANEL
ALT: 23 U/L (ref 0–35)
AST: 21 U/L (ref 0–37)
Albumin: 4.2 g/dL (ref 3.5–5.2)
Alkaline Phosphatase: 74 U/L (ref 39–117)
BUN: 16 mg/dL (ref 6–23)
CO2: 26 meq/L (ref 19–32)
Calcium: 9.5 mg/dL (ref 8.4–10.5)
Chloride: 101 mEq/L (ref 96–112)
Creatinine, Ser: 0.76 mg/dL (ref 0.40–1.20)
GFR: 78.75 mL/min (ref >60.00)
GLUCOSE: 104 mg/dL — AB (ref 70–99)
POTASSIUM: 4.4 meq/L (ref 3.5–5.1)
SODIUM: 134 meq/L — AB (ref 135–145)
TOTAL PROTEIN: 7.1 g/dL (ref 6.0–8.3)
Total Bilirubin: 0.6 mg/dL (ref 0.2–1.2)

## 2016-02-20 LAB — LIPID PANEL
Cholesterol: 201 mg/dL — ABNORMAL HIGH (ref 0–200)
HDL: 75.2 mg/dL (ref >39.00)
LDL Cholesterol: 111 mg/dL — ABNORMAL HIGH (ref 0–99)
NONHDL: 125.73
Total CHOL/HDL Ratio: 3
Triglycerides: 72 mg/dL (ref 0.0–149.0)
VLDL: 14.4 mg/dL (ref 0.0–40.0)

## 2016-02-20 LAB — CBC
HCT: 36.5 % (ref 36.0–46.0)
HEMOGLOBIN: 12.4 g/dL (ref 12.0–15.0)
MCHC: 34.1 g/dL (ref 30.0–36.0)
MCV: 89.3 fl (ref 78.0–100.0)
Platelets: 245 10*3/uL (ref 150.0–400.0)
RBC: 4.08 Mil/uL (ref 3.87–5.11)
RDW: 13.1 % (ref 11.5–15.5)
WBC: 5.5 10*3/uL (ref 4.0–10.5)

## 2016-02-20 MED ORDER — VALSARTAN 80 MG PO TABS: 80.0000 mg | ORAL_TABLET | Freq: Two times a day (BID) | ORAL | 3 refills | Status: DC

## 2016-02-20 MED ORDER — NEBIVOLOL HCL 10 MG PO TABS: 10.0000 mg | ORAL_TABLET | Freq: Every day | ORAL | 3 refills | Status: DC

## 2016-02-20 NOTE — Progress Notes (Signed)
[place] at [facility] [address], [place], [place] [address] [phone]  Date:  02/20/2016   Name:  Angela Gibbs   DOB:  11-07-1940   MRN:  [id number]  PCP:  Lamar Blinks, MD    Chief Complaint: Blood Pressure Check (Pt here for BP )   History of Present Illness:  Angela Gibbs is a 76 y.o. very pleasant female patient who presents with the following:  Last seen by myself in August of this year for a ST  BP Readings from Last 3 Encounters:  02/20/16 (!) 144/82  12/20/15 (!) 145/85  10/16/15 122/74   She is on diovan, bystolic, lasix as needed- however she does not use the lasix very frequently Low sodium on labs in August  She was quite late today due to car trouble.   She was made aware that we will cover her BP today She does check her BP at home- may run 125/ 70s.     She did have a pneumovax vaccine a few years ago- but she has not had the prevnar 13 as of yet and would like to have today  She is not fasting today for labs- she had just a smoothie so far today  Patient Active Problem List   Diagnosis Date Noted  . Abdominal pain, acute, right upper quadrant 07/29/2013    Past Medical History:  Diagnosis Date  . Allergy   . Concussion   . Falls   . Head injury   . Herpes   . Hypertension   . Migraines   . Nerve damage   . Shingles     Past Surgical History:  Procedure Laterality Date  . BRAIN SURGERY    . CRANIOTOMY FOR EXTRACRANIAL-INTRACRANIAL BYPASS    . EYE SURGERY  4.4.17   milia excision  . HIP SURGERY     intracranial surgery  . NERVE SURGERY    . ROOT CANAL    . TONSILLECTOMY      Social History  Substance Use Topics  . Smoking status: Never Smoker  . Smokeless tobacco: Not on file  . Alcohol use Yes     Comment: social    Family History  Problem Relation Age of Onset  . Heart disease Father   . Heart attack Father     Allergies  Allergen Reactions  . Cephalosporins  Shortness Of Breath    Breathing difficulty  . Ciprofloxacin Hives and Shortness Of Breath  . Keflex [Cephalexin] Hives  . Penicillins Hives  . Sulfacetamide Sodium Hives  . Xopenex [Levalbuterol Hcl] Anxiety and Other (See Comments)    Shaking/trembling  . Pregabalin Other (See Comments)  . Aspirin Other (See Comments)    Stomach pain/burning  . Chromium Other (See Comments)    Blisters  . Erythromycin Other (See Comments)    "stomach issues"  . Hydrochlorothiazide Other (See Comments)    Migraine  . Influenza Vaccines Other (See Comments)    Numbness in legs  . Iodinated Diagnostic Agents     unknown  . Iodine   . Lactose Intolerance (Gi) Other (See Comments)    "stomach issues"  . Latex   . Lisinopril Cough  . Nitrofuran Derivatives Hives, Itching and Swelling  . Petrolatum Nausea Only  . Shellfish Allergy Hives  . Sulfa Antibiotics Hives    Other reaction(s): Other (See Comments) Flank pain  . Troleandomycin Other (See Comments)    Unknown  . Zostavax [employer]  Vaccine Live] Other (See Comments)    Pt has been told by specialists not to receive vaccine d/t h/o herpes in bloodstream and shingles.  . Avelox [Moxifloxacin Hcl In Nacl] Nausea Only  . Cefdinir Nausea And Vomiting  . Eggs Or Egg-Derived Products     Per allergy testing, no known reaction. Pt states she was told she could probably eat 2-3 eggs weekly w/o issue.  . Levaquin [Levofloxacin] Hives, Palpitations and Other (See Comments)    Chest pain  . Percodan [Oxycodone-Aspirin] Nausea And Vomiting  . Prednisone Palpitations    No injections, can do the pills    Medication list has been reviewed and updated.  Current Outpatient Prescriptions on File Prior to Visit  Medication Sig Dispense Refill  . Ascorbic Acid (VITAMIN C) 100 MG tablet Take 1,000 mg by mouth daily. Reported on 05/24/2015    . benzonatate (TESSALON) 100 MG capsule Take 1 capsule (100 mg total) by mouth 3 (three) times daily as needed for  cough. 30 capsule 0  . butalbital-acetaminophen-caffeine (FIORICET, ESGIC) 50-325-40 MG per tablet Take by mouth 2 (two) times daily as needed for headache.    Marland Kitchen BYSTOLIC 10 MG tablet TAKE 1 TABLET BY MOUTH EVERY DAY 90 tablet 0  . Cholecalciferol (D 5000) 5000 UNITS TABS daily at 0600.    . clotrimazole-betamethasone (LOTRISONE) cream Apply 1 application topically as needed. Reported on 05/24/2015    . co-enzyme Q-10 30 MG capsule Take 30 mg by mouth daily.    Marland Kitchen doxycycline (VIBRA-TABS) 100 MG tablet Take 1 tablet (100 mg total) by mouth 2 (two) times daily. 20 tablet 0  . doxycycline (VIBRA-TABS) 100 MG tablet Take 1 tablet (100 mg total) by mouth 2 (two) times daily. Can give caps or generic 20 tablet 0  . erythromycin ophthalmic ointment 1 application.     . fentaNYL (DURAGESIC - DOSED MCG/HR) 12 MCG/HR Place 12.5 mcg onto the skin every 3 (three) days.     . furosemide (LASIX) 20 MG tablet Take 1 tablet (20 mg total) by mouth daily. Use on occasion as needed for swelling 30 tablet 1  . furosemide (LASIX) 20 MG tablet TAKE 1 TABLET BY MOUTH EVERY DAY. USE ON OCCASION AS NEEDED FOR SWELLING 90 tablet 1  . lidocaine (LIDODERM) 5 % Place 1 patch onto the skin.     . Nebivolol HCl (BYSTOLIC PO) Take 10 mg by mouth daily.     Marland Kitchen omeprazole (PRILOSEC OTC) 20 MG tablet Take 30 mg by mouth daily.     . Prednicarbate 0.1 % CREA     . promethazine (PHENERGAN) 25 MG tablet Take 1 tablet (25 mg total) by mouth every 6 (six) hours as needed for nausea or vomiting. 30 tablet 0  . sodium chloride (V-R NASAL SPRAY SALINE) 0.65 % nasal spray Place 1 spray into the nose as needed. 30 mL 3  . valsartan (DIOVAN) 80 MG tablet Take 1 tablet (80 mg total) by mouth 2 (two) times daily. 60 tablet 3   No current facility-administered medications on file prior to visit.     Review of Systems:  As per HPI- otherwise negative. She has some chronic swelling of her left ankle due to fractures in the past   Physical  Examination: Blood pressure (!) 144/82, pulse 87, temperature 98 F (36.7 C), temperature source Oral, height 5\' 3"  (1.6 m), weight 190 lb (86.2 kg), SpO2 97 %. Body mass index is 33.66 kg/m.  GEN: WDWN, NAD, Non-toxic,  A & O x 3, obese, otherwise looks well HEENT: Atraumatic, Normocephalic. Neck supple. No masses, No LAD. Ears and Nose: No external deformity. CV: RRR, No M/G/R. No JVD. No thrill. No extra heart sounds. PULM: CTA B, no wheezes, crackles, rhonchi. No retractions. No resp. distress. No accessory muscle use. EXTR: mild edema of left ankle- chronic.  Right ankle is normal today NEURO Normal gait.  PSYCH: Normally interactive. Conversant. Not depressed or anxious appearing.  Calm demeanor.      Assessment and Plan: Essential hypertension, benign - Plan: valsartan (DIOVAN) 80 MG tablet, nebivolol (BYSTOLIC) 10 MG tablet  Screening for hyperlipidemia - Plan: Comprehensive metabolic panel  Screening for deficiency anemia - Plan: Lipid panel  Medication monitoring encounter - Plan: Comprehensive metabolic panel, CBC  Immunization due - Plan: Pneumococcal conjugate vaccine 13-valent IM  Obesity, unspecified classification, unspecified obesity type, unspecified whether serious comorbidity present - Plan: Lipid panel  Here today to discuss her BP- she has good control per home monitor although slightly high today Continue current medications Labs pending as above prevnar shot today Will plan further follow- up pending labs.    Signed Lamar Blinks, MD

## 2016-02-20 NOTE — Progress Notes (Signed)
Pre visit review using our clinic review tool, if applicable. No additional management support is needed unless otherwise documented below in the visit note. 

## 2016-02-20 NOTE — Patient Instructions (Signed)
Continue your current blood pressure meds- I changed them to 90 day supplies You got your prevnar 13 pneumonia booster today- assuming you have had a pneumovax (pneumonia 23 shot) after the age of 41 you do not need any further pneumonia vaccines I will be in touch with your labs asap

## 2016-03-10 DIAGNOSIS — Z1231 Encounter for screening mammogram for malignant neoplasm of breast: Secondary | ICD-10-CM | POA: Diagnosis not present

## 2016-04-09 ENCOUNTER — Other Ambulatory Visit: Payer: Self-pay | Admitting: Allergy

## 2016-04-09 ENCOUNTER — Ambulatory Visit: Payer: Medicare Other | Admitting: Allergy and Immunology

## 2016-04-10 ENCOUNTER — Encounter: Payer: Self-pay | Admitting: Pediatrics

## 2016-04-10 ENCOUNTER — Ambulatory Visit (INDEPENDENT_AMBULATORY_CARE_PROVIDER_SITE_OTHER): Payer: Medicare Other | Admitting: Pediatrics

## 2016-04-10 VITALS — BP 128/72 | HR 60 | Temp 97.7°F | Resp 16 | Ht 62.5 in | Wt 187.4 lb

## 2016-04-10 DIAGNOSIS — J3089 Other allergic rhinitis: Secondary | ICD-10-CM

## 2016-04-10 DIAGNOSIS — J4541 Moderate persistent asthma with (acute) exacerbation: Secondary | ICD-10-CM | POA: Diagnosis not present

## 2016-04-10 DIAGNOSIS — R0602 Shortness of breath: Secondary | ICD-10-CM | POA: Diagnosis not present

## 2016-04-10 DIAGNOSIS — I10 Essential (primary) hypertension: Secondary | ICD-10-CM

## 2016-04-10 DIAGNOSIS — K219 Gastro-esophageal reflux disease without esophagitis: Secondary | ICD-10-CM

## 2016-04-10 DIAGNOSIS — Z79899 Other long term (current) drug therapy: Secondary | ICD-10-CM

## 2016-04-10 NOTE — Patient Instructions (Addendum)
Singulair 10 mg once a day for coughing or wheezing Xopenex 2 puffs every 6 hours if needed for coughing or wheezing Qvar 80-2 puffs twice a day to prevent coughing or wheezing Add prednisone 10 mg twice a day for 4 days 10 mg on the fifth day Fluticasone 2 sprays per nostril once a day if needed for stuffy nose Claritin 10 mg-take 1 tablet once a day if needed for runny nose Call me if you are not doing well on this treatment plan

## 2016-04-10 NOTE — Progress Notes (Signed)
[place] [address] Dept: [phone]  FOLLOW UP NOTE  Patient ID: Angela Gibbs, female    DOB: [birth year]  Age: 76 y.o. MRN: [id number] Date of Office Visit: 04/10/2016  Assessment  Chief Complaint: Asthma (pt is very winded. She went awhile without the meds.)  HPI Angela Gibbs presents for evaluation of shortness of breath. She was last seen in June 2015. In the past she has responded to medications for asthma. She has run out of medications. She is having shortness of breath with any exertion.. As a child she was exposed to very heavy smoking She has never smoked cigarettes. She has had tremors from albuterol. She can tolerate Xopenex Her initial episode of breathing difficulties began with influenza in December 2013. Her symptoms lasted one month then.. Subsequently, she developed asthmatic-like symptoms. In the past allergy skin testing was positive to some molds on intradermal testing only.  Current medications are outlined in the chart   Drug Allergies:  Allergies  Allergen Reactions  . Cephalosporins Shortness Of Breath    Breathing difficulty  . Ciprofloxacin Hives and Shortness Of Breath  . Keflex [Cephalexin] Hives  . Penicillins Hives  . Sulfacetamide Sodium Hives  . Xopenex [Levalbuterol Hcl] Anxiety and Other (See Comments)    Shaking/trembling  . Pregabalin Other (See Comments)  . Aspirin Other (See Comments)    Stomach pain/burning  . Chromium Other (See Comments)    Blisters  . Erythromycin Other (See Comments)    "stomach issues"  . Hydrochlorothiazide Other (See Comments)    Migraine  . Influenza Vaccines Other (See Comments)    Numbness in legs  . Iodinated Diagnostic Agents     unknown  . Iodine   . Lactose Intolerance (Gi) Other (See Comments)    "stomach issues"  . Latex   . Lisinopril Cough  . Nitrofuran Derivatives Hives, Itching and Swelling  . Petrolatum Nausea Only  . Shellfish Allergy Hives  . Sulfa Antibiotics  Hives    Other reaction(s): Other (See Comments) Flank pain  . Troleandomycin Other (See Comments)    Unknown  . Zostavax [Zoster Vaccine Live] Other (See Comments)    Pt has been told by specialists not to receive vaccine d/t h/o herpes in bloodstream and shingles.  . Avelox [Moxifloxacin Hcl In Nacl] Nausea Only  . Cefdinir Nausea And Vomiting  . Eggs Or Egg-Derived Products     Per allergy testing, no known reaction. Pt states she was told she could probably eat 2-3 eggs weekly w/o issue.  . Levaquin [Levofloxacin] Hives, Palpitations and Other (See Comments)    Chest pain  . Percodan [Oxycodone-Aspirin] Nausea And Vomiting  . Prednisone Palpitations    No injections, can do the pills    Physical Exam: BP 128/72   Pulse 60   Temp 97.7 F (36.5 C) (Oral)   Resp 16   Ht 5' 2.5" (1.588 m)   Wt 187 lb 6.4 oz (85 kg)   BMI 33.73 kg/m    Physical Exam  Constitutional: She appears well-developed and well-nourished.  HENT:  Eyes normal. Ears normal. Nose normal. Pharynx normal.  Neck: Neck supple.  Cardiovascular:  S1 and S2 normal no murmurs  Pulmonary/Chest:  Clear to percussion and auscultation  Lymphadenopathy:    She has no cervical adenopathy.  Neurological: She is alert.  Skin:  Clear  Psychiatric: She has a normal mood and affect. Her behavior is normal. Judgment and thought content normal.  Vitals reviewed.  Diagnostics:  Forced vital capacity 1.29 L FEV1 1.05 L. Predicted forced vital capacity 2.55 L predicted FEV1 1.90 L-this shows a moderate reduction in the forced vital capacity and FEV1  Assessment and Plan: 1. Current use of beta blocker   2. Moderate persistent asthma with acute exacerbation   3. Other allergic rhinitis   4. Gastroesophageal reflux disease without esophagitis   5. Essential hypertension     Meds ordered this encounter  Medications  . montelukast (SINGULAIR) 10 MG tablet    Sig: Take 1 tablet (10 mg total) by mouth at bedtime.      Dispense:  30 tablet    Refill:  5  . levalbuterol (XOPENEX HFA) 45 MCG/ACT inhaler    Sig: Inhale 2 puffs into the lungs every 6 (six) hours as needed for wheezing.    Dispense:  1 Inhaler    Refill:  5  . beclomethasone (QVAR) 80 MCG/ACT inhaler    Sig: Inhale 2 puffs into the lungs 2 (two) times daily.    Dispense:  1 Inhaler    Refill:  5  . fluticasone (FLONASE) 50 MCG/ACT nasal spray    Sig: Place 2 sprays into both nostrils daily.    Dispense:  16 g    Refill:  5    Patient Instructions  Singulair 10 mg once a day for coughing or wheezing Xopenex 2 puffs every 6 hours if needed for coughing or wheezing Qvar 80-2 puffs twice a day to prevent coughing or wheezing Add prednisone 10 mg twice a day for 4 days 10 mg on the fifth day Fluticasone 2 sprays per nostril once a day if needed for stuffy nose Claritin 10 mg-take 1 tablet once a day if needed for runny nose Call me if you are not doing well on this treatment plan   Return in about 4 weeks (around 05/08/2016).    Thank you for the opportunity to care for this patient.  Please do not hesitate to contact me with questions.  Penne Lash, M.D.  [facility] [address] [place], [place] [address] [phone]

## 2016-04-11 DIAGNOSIS — Z79899 Other long term (current) drug therapy: Secondary | ICD-10-CM | POA: Insufficient documentation

## 2016-04-11 DIAGNOSIS — K219 Gastro-esophageal reflux disease without esophagitis: Secondary | ICD-10-CM

## 2016-04-11 DIAGNOSIS — J3089 Other allergic rhinitis: Secondary | ICD-10-CM

## 2016-04-11 DIAGNOSIS — J4541 Moderate persistent asthma with (acute) exacerbation: Secondary | ICD-10-CM | POA: Insufficient documentation

## 2016-04-11 HISTORY — DX: Other long term (current) drug therapy: Z79.899

## 2016-04-11 HISTORY — DX: Gastro-esophageal reflux disease without esophagitis: K21.9

## 2016-04-11 HISTORY — DX: Other allergic rhinitis: J30.89

## 2016-04-11 MED ORDER — LEVALBUTEROL TARTRATE 45 MCG/ACT IN AERO: 2.0000 | INHALATION_SPRAY | Freq: Four times a day (QID) | RESPIRATORY_TRACT | 5 refills | Status: DC | PRN

## 2016-04-11 MED ORDER — FLUTICASONE PROPIONATE 50 MCG/ACT NA SUSP
2.0000 | Freq: Every day | NASAL | 5 refills | Status: DC
Start: 2016-04-11 — End: 2022-12-25

## 2016-04-11 MED ORDER — BECLOMETHASONE DIPROPIONATE 80 MCG/ACT IN AERS: 2.0000 | INHALATION_SPRAY | Freq: Two times a day (BID) | RESPIRATORY_TRACT | 5 refills | Status: DC

## 2016-04-11 MED ORDER — MONTELUKAST SODIUM 10 MG PO TABS: 10.0000 mg | ORAL_TABLET | Freq: Every day | ORAL | 5 refills | Status: DC

## 2016-04-14 ENCOUNTER — Telehealth: Payer: Self-pay | Admitting: Allergy

## 2016-04-14 ENCOUNTER — Other Ambulatory Visit: Payer: Self-pay | Admitting: Allergy

## 2016-04-14 MED ORDER — ALBUTEROL SULFATE HFA 108 (90 BASE) MCG/ACT IN AERS: 2.0000 | INHALATION_SPRAY | RESPIRATORY_TRACT | 1 refills | Status: DC | PRN

## 2016-04-14 NOTE — Telephone Encounter (Signed)
Done

## 2016-04-14 NOTE — Telephone Encounter (Signed)
Levalbuterol tartate hfa 45 mcg HFA AER AD been approved Case number [id number]. Ventolin was not send in for patient. Entered in error.

## 2016-04-22 DIAGNOSIS — Z79899 Other long term (current) drug therapy: Secondary | ICD-10-CM | POA: Diagnosis not present

## 2016-04-22 DIAGNOSIS — G894 Chronic pain syndrome: Secondary | ICD-10-CM | POA: Diagnosis not present

## 2016-04-22 DIAGNOSIS — Z5181 Encounter for therapeutic drug level monitoring: Secondary | ICD-10-CM | POA: Diagnosis not present

## 2016-04-22 DIAGNOSIS — M25551 Pain in right hip: Secondary | ICD-10-CM | POA: Diagnosis not present

## 2016-04-22 DIAGNOSIS — B0229 Other postherpetic nervous system involvement: Secondary | ICD-10-CM | POA: Diagnosis not present

## 2016-05-13 ENCOUNTER — Encounter: Payer: Self-pay | Admitting: Pediatrics

## 2016-05-13 ENCOUNTER — Ambulatory Visit (INDEPENDENT_AMBULATORY_CARE_PROVIDER_SITE_OTHER): Payer: Medicare Other | Admitting: Pediatrics

## 2016-05-13 VITALS — BP 140/80 | HR 56 | Temp 98.3°F | Resp 16

## 2016-05-13 DIAGNOSIS — J984 Other disorders of lung: Secondary | ICD-10-CM | POA: Diagnosis not present

## 2016-05-13 DIAGNOSIS — J3089 Other allergic rhinitis: Secondary | ICD-10-CM

## 2016-05-13 DIAGNOSIS — K219 Gastro-esophageal reflux disease without esophagitis: Secondary | ICD-10-CM

## 2016-05-13 DIAGNOSIS — I10 Essential (primary) hypertension: Secondary | ICD-10-CM

## 2016-05-13 DIAGNOSIS — J4540 Moderate persistent asthma, uncomplicated: Secondary | ICD-10-CM | POA: Diagnosis not present

## 2016-05-13 HISTORY — DX: Moderate persistent asthma, uncomplicated: J45.40

## 2016-05-13 HISTORY — DX: Other disorders of lung: J98.4

## 2016-05-13 MED ORDER — BUDESONIDE-FORMOTEROL FUMARATE 160-4.5 MCG/ACT IN AERO: 2.0000 | INHALATION_SPRAY | Freq: Two times a day (BID) | RESPIRATORY_TRACT | 5 refills | Status: DC

## 2016-05-13 NOTE — Progress Notes (Signed)
[facility] [id number] Dept: [phone]  FOLLOW UP NOTE  Patient ID: Angela Gibbs, female    DOB: 1940/04/18  Age: 76 y.o. MRN: [id number] Date of Office Visit: 05/13/2016  Assessment  Chief Complaint: Asthma  HPI Angela Gibbs presents for follow-up of asthma and shortness of breath with exercise.. She did not take prednisone 10 mg twice a day for 4 days, 10 mg on the fifth day because it irritated her stomach. She has a history of severe gastroesophageal reflux for over 10 years, and is on medications. She is not having nasal congestion. She is not using Qvar 80 on a daily basis. She is using Xopenex 2 puffs twice a day . In the past she had tremors from albuterol  Current medications are Claritin 10 mg once a day if needed for runny nose, fluticasone 2 sprays per nostril once a day if needed for stuffy nose, Singulair 10 mg once a day and Xopenex 2 puffs every 6 hours if needed for coughing or wheezing, omeprazole 20 mg once a day. Her other medications are outlined in the chart   Drug Allergies:  Allergies  Allergen Reactions  . Cephalosporins Shortness Of Breath    Breathing difficulty  . Ciprofloxacin Hives and Shortness Of Breath  . Keflex [Cephalexin] Hives  . Penicillins Hives  . Sulfacetamide Sodium Hives  . Xopenex [Levalbuterol Hcl] Anxiety and Other (See Comments)    Shaking/trembling  . Pregabalin Other (See Comments)  . Aspirin Other (See Comments)    Stomach pain/burning  . Chromium Other (See Comments)    Blisters  . Erythromycin Other (See Comments)    "stomach issues"  . Hydrochlorothiazide Other (See Comments)    Migraine  . Influenza Vaccines Other (See Comments)    Numbness in legs  . Iodinated Diagnostic Agents     unknown  . Iodine   . Lactose Intolerance (Gi) Other (See Comments)    "stomach issues"  . Latex   . Lisinopril Cough  . Nitrofuran Derivatives Hives, Itching and Swelling  . Petrolatum Nausea Only  . Shellfish  Allergy Hives  . Sulfa Antibiotics Hives    Other reaction(s): Other (See Comments) Flank pain  . Troleandomycin Other (See Comments)    Unknown  . Zostavax [Zoster Vaccine Live] Other (See Comments)    Pt has been told by specialists not to receive vaccine d/t h/o herpes in bloodstream and shingles.  . Avelox [Moxifloxacin Hcl In Nacl] Nausea Only  . Cefdinir Nausea And Vomiting  . Eggs Or Egg-Derived Products     Per allergy testing, no known reaction. Pt states she was told she could probably eat 2-3 eggs weekly w/o issue.  . Levaquin [Levofloxacin] Hives, Palpitations and Other (See Comments)    Chest pain  . Percodan [Oxycodone-Aspirin] Nausea And Vomiting  . Prednisone Palpitations    No injections, can do the pills    Physical Exam: BP 140/80   Pulse (!) 56   Temp 98.3 F (36.8 C)   Resp 16   SpO2 96%    Physical Exam  Constitutional: She is oriented to person, place, and time. She appears well-developed and well-nourished.  HENT:  Eyes normal. Ears normal. Nose normal. Pharynx normal.  Neck: Neck supple.  Cardiovascular:  S1 and S2 normal no murmurs  Pulmonary/Chest:  Clear to percussion auscultation except for some decreased breath sounds at the lung bases  Lymphadenopathy:    She has no cervical adenopathy.  Neurological: She is alert and  oriented to person, place, and time.  Psychiatric: She has a normal mood and affect. Her behavior is normal. Judgment and thought content normal.  Vitals reviewed.   Diagnostics:  FVC 1.22 L FEV1  1.08 L predicted FVC 2.69 L predicted FEV1 2.01 liters-this shows severe reduction in the forced vital capacity indicating restrictive lung disease. Her pulse ox was 96%  Assessment and Plan: 1. Moderate persistent asthma without complication   2. Restrictive lung disease   3. Gastroesophageal reflux disease without esophagitis   4. Essential hypertension   5. Other allergic rhinitis     Meds ordered this encounter    Medications  . budesonide-formoterol (SYMBICORT) 160-4.5 MCG/ACT inhaler    Sig: Inhale 2 puffs into the lungs 2 (two) times daily.    Dispense:  1 Inhaler    Refill:  5    To  Prevent cough or wheeze.    Patient Instructions  Claritin 10 mg once a day if needed for runny nose Fluticasone 2 sprays per nostril once a day if needed for stuffy nose Singulair 10 mg once a day for coughing or wheezing Xopenex 2 puffs every 6 hours if needed for coughing or wheezing Stop Qvar 80 Symbicort 160-2 puffs every 12 hours to prevent coughing and wheezing Continue your medications for gastroesophageal reflux I recommend that you see a pulmonary specialist for your  restrictive lung disease. We should keep in mind that she has a history of severe gastroesophageal reflux for 10 years which may be contributing to her restrictive lung disease We will arrange for a follow-up appointment after you have your pulmonary consultation   Return if symptoms worsen or fail to improve.    Thank you for the opportunity to care for this patient.  Please do not hesitate to contact me with questions.  Penne Lash, M.D.  [facility] [address] [place], [place] [address] [phone]

## 2016-05-13 NOTE — Patient Instructions (Addendum)
Claritin 10 mg once a day if needed for runny nose Fluticasone 2 sprays per nostril once a day if needed for stuffy nose Singulair 10 mg once a day for coughing or wheezing Xopenex 2 puffs every 6 hours if needed for coughing or wheezing Stop Qvar 80 Symbicort 160-2 puffs every 12 hours to prevent coughing and wheezing Continue your medications for gastroesophageal reflux I recommend that you see a pulmonary specialist for your  restrictive lung disease. We should keep in mind that she has a history of severe gastroesophageal reflux for 10 years which may be contributing to her restrictive lung disease We will arrange for a follow-up appointment after you have your pulmonary consultation

## 2016-05-23 ENCOUNTER — Encounter: Payer: Self-pay | Admitting: Family Medicine

## 2016-05-23 DIAGNOSIS — Z5181 Encounter for therapeutic drug level monitoring: Secondary | ICD-10-CM

## 2016-05-26 DIAGNOSIS — M25551 Pain in right hip: Secondary | ICD-10-CM | POA: Diagnosis not present

## 2016-05-26 DIAGNOSIS — G894 Chronic pain syndrome: Secondary | ICD-10-CM | POA: Diagnosis not present

## 2016-05-26 DIAGNOSIS — B0229 Other postherpetic nervous system involvement: Secondary | ICD-10-CM | POA: Diagnosis not present

## 2016-05-26 DIAGNOSIS — Z79899 Other long term (current) drug therapy: Secondary | ICD-10-CM | POA: Diagnosis not present

## 2016-05-27 ENCOUNTER — Other Ambulatory Visit (INDEPENDENT_AMBULATORY_CARE_PROVIDER_SITE_OTHER): Payer: Medicare Other

## 2016-05-27 DIAGNOSIS — Z5181 Encounter for therapeutic drug level monitoring: Secondary | ICD-10-CM

## 2016-05-28 LAB — COMPREHENSIVE METABOLIC PANEL
ALBUMIN: 4.2 g/dL (ref 3.5–5.2)
ALK PHOS: 73 U/L (ref 39–117)
ALT: 17 U/L (ref 0–35)
AST: 17 U/L (ref 0–37)
BILIRUBIN TOTAL: 0.5 mg/dL (ref 0.2–1.2)
BUN: 14 mg/dL (ref 6–23)
CALCIUM: 9.5 mg/dL (ref 8.4–10.5)
CO2: 29 mEq/L (ref 19–32)
Chloride: 98 mEq/L (ref 96–112)
Creatinine, Ser: 0.74 mg/dL (ref 0.40–1.20)
GFR: 81.16 mL/min (ref >60.00)
GLUCOSE: 92 mg/dL (ref 70–99)
Potassium: 5 mEq/L (ref 3.5–5.1)
Sodium: 132 mEq/L — ABNORMAL LOW (ref 135–145)
TOTAL PROTEIN: 7.2 g/dL (ref 6.0–8.3)

## 2016-06-11 ENCOUNTER — Ambulatory Visit (INDEPENDENT_AMBULATORY_CARE_PROVIDER_SITE_OTHER): Payer: Medicare Other | Admitting: Family Medicine

## 2016-06-11 VITALS — BP 138/75 | HR 53 | Temp 97.8°F | Ht 62.5 in | Wt 187.8 lb

## 2016-06-11 DIAGNOSIS — Z23 Encounter for immunization: Secondary | ICD-10-CM | POA: Diagnosis not present

## 2016-06-11 DIAGNOSIS — I10 Essential (primary) hypertension: Secondary | ICD-10-CM | POA: Diagnosis not present

## 2016-06-11 DIAGNOSIS — E871 Hypo-osmolality and hyponatremia: Secondary | ICD-10-CM

## 2016-06-11 DIAGNOSIS — S0180XA Unspecified open wound of other part of head, initial encounter: Secondary | ICD-10-CM | POA: Diagnosis not present

## 2016-06-11 DIAGNOSIS — J301 Allergic rhinitis due to pollen: Secondary | ICD-10-CM

## 2016-06-11 MED ORDER — NEBIVOLOL HCL 10 MG PO TABS: 10.0000 mg | ORAL_TABLET | Freq: Every day | ORAL | 3 refills | Status: DC

## 2016-06-11 MED ORDER — SALINE NASAL SPRAY 0.65 % NA SOLN: 1.0000 | NASAL | 6 refills | Status: AC | PRN

## 2016-06-11 MED ORDER — VALSARTAN 80 MG PO TABS: 80.0000 mg | ORAL_TABLET | Freq: Two times a day (BID) | ORAL | 3 refills | Status: DC

## 2016-06-11 NOTE — Patient Instructions (Signed)
It was a pleasure to see you toyay as always! Take care and let's plan to meet in about 6 months to check in and do labs  You got your tetanus booster today

## 2016-06-11 NOTE — Progress Notes (Signed)
Pre visit review using our clinic review tool, if applicable. No additional management support is needed unless otherwise documented below in the visit note. 

## 2016-06-11 NOTE — Progress Notes (Signed)
[facility] [address], [place], [place] [address] [phone]  Date:  06/11/2016   Name:  Angela Gibbs   DOB:  July 29, 1940   MRN:  [id number]  PCP:  Lamar Blinks, MD    Chief Complaint: Follow-up (Pt here to dicuss sodium level and blood pressure. )   History of Present Illness:  Angela Gibbs is a 76 y.o. very pleasant female patient who presents with the following:  Last seen by myself in January of this year: She is on diovan, bystolic, lasix as needed- however she does not use the lasix very frequently   Here today to follow-up of her BP and nasal spray for allergies  She has come off her fentanyl patch over the last month- she is doing ok in this regard and seems to have come through the worst of the withdrawal SE She is managed by a pain clinic at [place]: [facility]  Hyponatremia is a family trait per pt.  She has had this for years and it has never caused any serious adverse effects per her knowledge She has seen nephrology and been told that she was ok- this is just how she is.  ?SIADH  Stopping the fentanyl has improved the swelling in her feet. She is not having to use the lasix right now  colonoscopy is UTD Last tetanus shot: pt thinks that she is likely due and would like to have done today She did scratch her face not long ago and would like to have her tetanus boosted to be on the safe side  We did her prevnar in January  At home her BP is generally 115-120/75-80; she does monitor it on a regular basis  BP Readings from Last 3 Encounters:  06/11/16 138/75  05/13/16 140/80  04/10/16 128/72    Patient Active Problem List   Diagnosis Date Noted  . Restrictive lung disease 05/13/2016  . Moderate persistent asthma without complication 38/25/0539  . Moderate persistent asthma with acute exacerbation 04/11/2016  . Current use of beta blocker 04/11/2016  . Other allergic rhinitis  04/11/2016  . Gastroesophageal reflux disease without esophagitis 04/11/2016  . Essential hypertension 02/20/2016  . Abdominal pain, acute, right upper quadrant 07/29/2013    Past Medical History:  Diagnosis Date  . Allergy   . Concussion   . Falls   . Head injury   . Herpes   . Hypertension   . Migraines   . Nerve damage   . Shingles     Past Surgical History:  Procedure Laterality Date  . BRAIN SURGERY    . CRANIOTOMY FOR EXTRACRANIAL-INTRACRANIAL BYPASS    . EYE SURGERY  4.4.17   milia excision  . HIP SURGERY     intracranial surgery  . NERVE SURGERY    . ROOT CANAL    . TONSILLECTOMY      Social History  Substance Use Topics  . Smoking status: Never Smoker  . Smokeless tobacco: Never Used  . Alcohol use Yes     Comment: social    Family History  Problem Relation Age of Onset  . Heart disease Father   . Heart attack Father     Allergies  Allergen Reactions  . Cephalosporins Shortness Of Breath    Breathing difficulty  . Ciprofloxacin Hives and Shortness Of Breath  . Keflex [Cephalexin] Hives  . Penicillins Hives  . Sulfacetamide Sodium Hives  . Xopenex [Levalbuterol Hcl] Anxiety and  Other (See Comments)    Tremors with violent body chattering and shaking  . Pregabalin Other (See Comments)  . Aspirin Other (See Comments)    Stomach pain/burning  . Chromium Other (See Comments)    Blisters  . Erythromycin Other (See Comments)    "stomach issues"  . Hydrochlorothiazide Other (See Comments)    Migraine  . Influenza Vaccines Other (See Comments)    Numbness in legs  . Iodinated Diagnostic Agents     unknown  . Iodine   . Lactose Intolerance (Gi) Other (See Comments)    "stomach issues"  . Latex   . Lisinopril Cough  . Nitrofuran Derivatives Hives, Itching and Swelling  . Petrolatum Nausea Only  . Shellfish Allergy Hives  . Sulfa Antibiotics Hives    Other reaction(s): Other (See Comments) Flank pain  . Troleandomycin Other (See Comments)     Unknown  . Zostavax [Zoster Vaccine Live] Other (See Comments)    Pt has been told by specialists not to receive vaccine d/t h/o herpes in bloodstream and shingles.  . Avelox [Moxifloxacin Hcl In Nacl] Nausea Only  . Cefdinir Nausea And Vomiting  . Eggs Or Egg-Derived Products     Per allergy testing, no known reaction. Pt states she was told she could probably eat 2-3 eggs weekly w/o issue.  . Levaquin [Levofloxacin] Hives, Palpitations and Other (See Comments)    Chest pain  . Percodan [Oxycodone-Aspirin] Nausea And Vomiting  . Prednisone Palpitations    No injections, can do the pills    Medication list has been reviewed and updated.  Current Outpatient Prescriptions on File Prior to Visit  Medication Sig Dispense Refill  . Ascorbic Acid (VITAMIN C) 100 MG tablet Take 1,000 mg by mouth daily. Reported on 05/24/2015    . beclomethasone (QVAR) 80 MCG/ACT inhaler Inhale 2 puffs into the lungs 2 (two) times daily. (Patient not taking: Reported on 05/13/2016) 1 Inhaler 5  . budesonide-formoterol (SYMBICORT) 160-4.5 MCG/ACT inhaler Inhale 2 puffs into the lungs 2 (two) times daily. 1 Inhaler 5  . butalbital-acetaminophen-caffeine (FIORICET, ESGIC) 50-325-40 MG per tablet Take by mouth 2 (two) times daily as needed for headache.    . Cholecalciferol (D 5000) 5000 UNITS TABS daily at 0600.    Marland Kitchen co-enzyme Q-10 30 MG capsule Take 30 mg by mouth daily.    Marland Kitchen erythromycin ophthalmic ointment 1 application.     . fluticasone (FLONASE) 50 MCG/ACT nasal spray Place 2 sprays into both nostrils daily. 16 g 5  . furosemide (LASIX) 20 MG tablet Take 1 tablet (20 mg total) by mouth daily. Use on occasion as needed for swelling 30 tablet 1  . levalbuterol (XOPENEX HFA) 45 MCG/ACT inhaler Inhale 2 puffs into the lungs every 6 (six) hours as needed for wheezing. 1 Inhaler 5  . lidocaine (LIDODERM) 5 % Place 1 patch onto the skin.     . montelukast (SINGULAIR) 10 MG tablet Take 1 tablet (10 mg total) by  mouth at bedtime. 30 tablet 5  . omeprazole (PRILOSEC OTC) 20 MG tablet Take 30 mg by mouth daily.     . Prednicarbate 0.1 % CREA     . promethazine (PHENERGAN) 25 MG tablet Take 1 tablet (25 mg total) by mouth every 6 (six) hours as needed for nausea or vomiting. 30 tablet 0   No current facility-administered medications on file prior to visit.     Review of Systems:  As per HPI- otherwise negative.   Physical Examination: Vitals:  06/11/16 1353 06/11/16 1416  BP: (!) 151/55 138/75  Pulse: (!) 53   Temp: 97.8 F (36.6 C)    Vitals:   06/11/16 1353  Weight: 187 lb 12.8 oz (85.2 kg)  Height: 5' 2.5" (1.588 m)   Body mass index is 33.8 kg/m. Ideal Body Weight: Weight in (lb) to have BMI = 25: 138.6  GEN: WDWN, NAD, Non-toxic, A & O x 3, overweight, looks well HEENT: Atraumatic, Normocephalic. Neck supple. No masses, No LAD. Healing scratch on left cheek/ cheek.  No sutures needed Ears and Nose: No external deformity. CV: RRR, No M/G/R. No JVD. No thrill. No extra heart sounds. PULM: CTA B, no wheezes, crackles, rhonchi. No retractions. No resp. distress. No accessory muscle use. EXTR: No c/c/e NEURO Normal gait.  PSYCH: Normally interactive. Conversant. Not depressed or anxious appearing.  Calm demeanor.    Assessment and Plan: Essential hypertension, benign - Plan: valsartan (DIOVAN) 80 MG tablet, nebivolol (BYSTOLIC) 10 MG tablet  Immunization due - Plan: Td vaccine greater than or equal to 7yo preservative free IM  Hyponatremia  Open wound of face, initial encounter - Plan: Td vaccine greater than or equal to 7yo preservative free IM  Seasonal allergic rhinitis due to pollen - Plan: sodium chloride (V-R NASAL SPRAY SALINE) 0.65 % nasal spray  Refilled her BP meds and her compounded nasal saline spray- called her compounding pharmacy to clarify this med Updated td today Asked her to see me in 6 months  Recent sodium of 132 should not cause any acute sx    Signed Lamar Blinks, MD

## 2016-06-12 DIAGNOSIS — I679 Cerebrovascular disease, unspecified: Secondary | ICD-10-CM | POA: Diagnosis not present

## 2016-06-12 DIAGNOSIS — R208 Other disturbances of skin sensation: Secondary | ICD-10-CM | POA: Diagnosis not present

## 2016-06-12 DIAGNOSIS — Z9181 History of falling: Secondary | ICD-10-CM | POA: Diagnosis not present

## 2016-06-19 ENCOUNTER — Ambulatory Visit (HOSPITAL_BASED_OUTPATIENT_CLINIC_OR_DEPARTMENT_OTHER)
Admission: RE | Admit: 2016-06-19 | Discharge: 2016-06-19 | Disposition: A | Discharge status: Home / Self Care | Payer: Medicare Other | Source: Ambulatory Visit | Attending: Pulmonary Disease | Admitting: Pulmonary Disease

## 2016-06-19 ENCOUNTER — Ambulatory Visit (INDEPENDENT_AMBULATORY_CARE_PROVIDER_SITE_OTHER): Payer: Medicare Other | Admitting: Pulmonary Disease

## 2016-06-19 ENCOUNTER — Encounter: Payer: Self-pay | Admitting: Pulmonary Disease

## 2016-06-19 DIAGNOSIS — I700 Atherosclerosis of aorta: Secondary | ICD-10-CM | POA: Insufficient documentation

## 2016-06-19 DIAGNOSIS — R05 Cough: Secondary | ICD-10-CM | POA: Diagnosis not present

## 2016-06-19 DIAGNOSIS — M4185 Other forms of scoliosis, thoracolumbar region: Secondary | ICD-10-CM | POA: Insufficient documentation

## 2016-06-19 DIAGNOSIS — K449 Diaphragmatic hernia without obstruction or gangrene: Secondary | ICD-10-CM | POA: Diagnosis not present

## 2016-06-19 DIAGNOSIS — J984 Other disorders of lung: Secondary | ICD-10-CM | POA: Insufficient documentation

## 2016-06-19 NOTE — Patient Instructions (Signed)
CXR today. Your problems may have been related to fentanyl patch Call us if he continued to have problems with breathing in the future

## 2016-06-19 NOTE — Progress Notes (Signed)
Subjective:    Patient ID: Angela Gibbs, female    DOB: 05/12/1940, 76 y.o.   MRN: [id number]  HPI  Chief Complaint  Patient presents with  . Pulm Consult    Referred by Dr. Shaune Leeks (allergist) for restrictive lung disease.     76 year old never smoker referred by her allergist for evaluation of restrictive lung disease noted on spirometry.  She reports an allergic reaction to penicillin 5 years ago and onset of asthma since then. She denies childhood history of asthma. No specific triggers and no seasonal variation of symptoms. She reports dyspnea constantly without wheezing which is restricting her activities. She was using Xopenex MDI for rescue quite frequently. She was given Symbicort which she never really took for long duration. On her last visit she was given a sample of Qvar which has not started taking yet. She was maintained on a fentanyl patch for chronic pain and she finally to the step of discontinued this about a month ago. Her symptoms have dramatically improved and she feels like her dyspnea is resolved and she can walk longer. She's gained about 30 pounds in the last 5 years.  Spirometry 03/2016 showed a ratio of 81 with FEV1 of 55% and FVC of 51% Spirometry 04/2016 and again showed a ratio of 89 with FEV1 of 54% and FVC of 45% again indicating moderate restriction Both these spirometry shows suboptimal efforts of 2-3 seconds  She denies cough, wheezing, pedal edema, orthopnea paroxysmal nocturnal dyspnea. She had cardiac evaluation which was normal  Oxygen saturation was stable in spite of walking at a brisk pace  Past Medical History:  Diagnosis Date  . Allergy   . Concussion   . Falls   . Head injury   . Herpes   . Hypertension   . Migraines   . Nerve damage   . Shingles      Past Surgical History:  Procedure Laterality Date  . BRAIN SURGERY    . CRANIOTOMY FOR EXTRACRANIAL-INTRACRANIAL BYPASS    . EYE SURGERY  4.4.17   milia excision  . HIP  SURGERY     intracranial surgery  . NERVE SURGERY    . ROOT CANAL    . TONSILLECTOMY       Allergies  Allergen Reactions  . Cephalosporins Shortness Of Breath    Breathing difficulty  . Ciprofloxacin Hives and Shortness Of Breath  . Keflex [Cephalexin] Hives  . Penicillins Hives  . Sulfacetamide Sodium Hives  . Xopenex [Levalbuterol Hcl] Anxiety and Other (See Comments)    Tremors with violent body chattering and shaking  . Pregabalin Other (See Comments)  . Aspirin Other (See Comments)    Stomach pain/burning  . Chromium Other (See Comments)    Blisters  . Erythromycin Other (See Comments)    "stomach issues"  . Hydrochlorothiazide Other (See Comments)    Migraine  . Influenza Vaccines Other (See Comments)    Numbness in legs  . Iodinated Diagnostic Agents     unknown  . Iodine   . Lactose Intolerance (Gi) Other (See Comments)    "stomach issues"  . Latex   . Lisinopril Cough  . Nitrofuran Derivatives Hives, Itching and Swelling  . Petrolatum Nausea Only  . Shellfish Allergy Hives  . Sulfa Antibiotics Hives    Other reaction(s): Other (See Comments) Flank pain  . Troleandomycin Other (See Comments)    Unknown  . Zostavax [Zoster Vaccine Live] Other (See Comments)    Pt has been told by  specialists not to receive vaccine d/t h/o herpes in bloodstream and shingles.  . Avelox [Moxifloxacin Hcl In Nacl] Nausea Only  . Cefdinir Nausea And Vomiting  . Eggs Or Egg-Derived Products     Per allergy testing, no known reaction. Pt states she was told she could probably eat 2-3 eggs weekly w/o issue.  . Levaquin [Levofloxacin] Hives, Palpitations and Other (See Comments)    Chest pain  . Percodan [Oxycodone-Aspirin] Nausea And Vomiting  . Prednisone Palpitations    No injections, can do the pills     Social History   Social History  . Marital status: Married    Spouse name: N/A  . Number of children: N/A  . Years of education: N/A   Occupational History  . Not  on file.   Social History Main Topics  . Smoking status: Never Smoker  . Smokeless tobacco: Never Used  . Alcohol use Yes     Comment: social  . Drug use: No  . Sexual activity: Not on file   Other Topics Concern  . Not on file   Social History Narrative  . No narrative on file     Family History  Problem Relation Age of Onset  . Heart disease Father   . Heart attack Father      Review of Systems Constitutional: negative for anorexia, fevers and sweats  Eyes: negative for irritation, redness and visual disturbance  Ears, nose, mouth, throat, and face: negative for earaches, epistaxis, nasal congestion and sore throat  Respiratory: negative for cough, dyspnea on exertion, sputum and wheezing  Cardiovascular: negative for chest pain, dyspnea, lower extremity edema, orthopnea, palpitations and syncope  Gastrointestinal: negative for abdominal pain, constipation, diarrhea, melena, nausea and vomiting  Genitourinary:negative for dysuria, frequency and hematuria  Hematologic/lymphatic: negative for bleeding, easy bruising and lymphadenopathy  Musculoskeletal:negative for arthralgias, muscle weakness and stiff joints  Neurological: negative for coordination problems, gait problems, headaches and weakness  Endocrine: negative for diabetic symptoms including polydipsia, polyuria and weight loss     Objective:   Physical Exam  Gen. Pleasant, well-nourished, in no distress, normal affect ENT - no lesions, no post nasal drip Neck: No JVD, no thyromegaly, no carotid bruits Lungs: no use of accessory muscles, no dullness to percussion, clear without rales or rhonchi  Cardiovascular: Rhythm regular, heart sounds  normal, no murmurs or gallops, no peripheral edema Abdomen: soft and non-tender, no hepatosplenomegaly, BS normal. Musculoskeletal: No deformities, no cyanosis or clubbing Neuro:  alert, non focal       Assessment & Plan:

## 2016-06-19 NOTE — Assessment & Plan Note (Signed)
Restriction noted on PFTs may be related to poor effort I see that her duration of exhalation is only 2-3 seconds. No obstruction is noted  We will obtain chest x-ray for completion but it does seem that most of her dyspnea is resolved now that she is off the fentanyl patch. I do not think any further workup would be necessary at this time. She does admit to some level of deconditioning and will start on an exercise program

## 2016-06-23 DIAGNOSIS — G8929 Other chronic pain: Secondary | ICD-10-CM | POA: Diagnosis not present

## 2016-06-23 DIAGNOSIS — M545 Low back pain: Secondary | ICD-10-CM | POA: Diagnosis not present

## 2016-06-23 DIAGNOSIS — M25551 Pain in right hip: Secondary | ICD-10-CM | POA: Diagnosis not present

## 2016-06-23 DIAGNOSIS — B0229 Other postherpetic nervous system involvement: Secondary | ICD-10-CM | POA: Diagnosis not present

## 2016-06-23 DIAGNOSIS — G894 Chronic pain syndrome: Secondary | ICD-10-CM | POA: Diagnosis not present

## 2016-06-27 ENCOUNTER — Other Ambulatory Visit: Payer: Self-pay | Admitting: Emergency Medicine

## 2016-07-01 DIAGNOSIS — H01021 Squamous blepharitis right upper eyelid: Secondary | ICD-10-CM | POA: Diagnosis not present

## 2016-07-01 DIAGNOSIS — H01024 Squamous blepharitis left upper eyelid: Secondary | ICD-10-CM | POA: Diagnosis not present

## 2016-07-01 DIAGNOSIS — Z886 Allergy status to analgesic agent status: Secondary | ICD-10-CM | POA: Diagnosis not present

## 2016-07-01 DIAGNOSIS — I10 Essential (primary) hypertension: Secondary | ICD-10-CM | POA: Diagnosis not present

## 2016-07-01 DIAGNOSIS — H43813 Vitreous degeneration, bilateral: Secondary | ICD-10-CM | POA: Diagnosis not present

## 2016-07-01 DIAGNOSIS — Z9889 Other specified postprocedural states: Secondary | ICD-10-CM | POA: Diagnosis not present

## 2016-07-01 DIAGNOSIS — H33311 Horseshoe tear of retina without detachment, right eye: Secondary | ICD-10-CM | POA: Diagnosis not present

## 2016-07-01 DIAGNOSIS — H2513 Age-related nuclear cataract, bilateral: Secondary | ICD-10-CM | POA: Diagnosis not present

## 2016-07-01 DIAGNOSIS — Z882 Allergy status to sulfonamides status: Secondary | ICD-10-CM | POA: Diagnosis not present

## 2016-07-01 DIAGNOSIS — H33111 Cyst of ora serrata, right eye: Secondary | ICD-10-CM | POA: Diagnosis not present

## 2016-07-01 DIAGNOSIS — H029 Unspecified disorder of eyelid: Secondary | ICD-10-CM | POA: Diagnosis not present

## 2016-07-01 DIAGNOSIS — Z880 Allergy status to penicillin: Secondary | ICD-10-CM | POA: Diagnosis not present

## 2016-07-01 DIAGNOSIS — Z881 Allergy status to other antibiotic agents status: Secondary | ICD-10-CM | POA: Diagnosis not present

## 2016-07-01 DIAGNOSIS — H01025 Squamous blepharitis left lower eyelid: Secondary | ICD-10-CM | POA: Diagnosis not present

## 2016-07-01 DIAGNOSIS — H01022 Squamous blepharitis right lower eyelid: Secondary | ICD-10-CM | POA: Diagnosis not present

## 2016-07-09 DIAGNOSIS — L400 Psoriasis vulgaris: Secondary | ICD-10-CM | POA: Diagnosis not present

## 2016-07-09 DIAGNOSIS — L239 Allergic contact dermatitis, unspecified cause: Secondary | ICD-10-CM | POA: Diagnosis not present

## 2016-07-10 ENCOUNTER — Other Ambulatory Visit: Payer: Self-pay | Admitting: Emergency Medicine

## 2016-07-10 DIAGNOSIS — I10 Essential (primary) hypertension: Secondary | ICD-10-CM

## 2016-07-15 ENCOUNTER — Other Ambulatory Visit: Payer: Self-pay | Admitting: Family Medicine

## 2016-07-15 DIAGNOSIS — I10 Essential (primary) hypertension: Secondary | ICD-10-CM

## 2016-08-03 ENCOUNTER — Encounter (HOSPITAL_BASED_OUTPATIENT_CLINIC_OR_DEPARTMENT_OTHER): Payer: Self-pay | Admitting: *Deleted

## 2016-08-03 ENCOUNTER — Emergency Department (HOSPITAL_BASED_OUTPATIENT_CLINIC_OR_DEPARTMENT_OTHER)
Admission: EM | Admit: 2016-08-03 | Discharge: 2016-08-03 | Discharge status: Against Medical Advice | Payer: Medicare Other | Attending: Emergency Medicine | Admitting: Emergency Medicine

## 2016-08-03 ENCOUNTER — Emergency Department (HOSPITAL_BASED_OUTPATIENT_CLINIC_OR_DEPARTMENT_OTHER): Payer: Medicare Other

## 2016-08-03 DIAGNOSIS — R079 Chest pain, unspecified: Secondary | ICD-10-CM | POA: Diagnosis not present

## 2016-08-03 DIAGNOSIS — R0789 Other chest pain: Secondary | ICD-10-CM | POA: Insufficient documentation

## 2016-08-03 DIAGNOSIS — Z91040 Latex allergy status: Secondary | ICD-10-CM | POA: Insufficient documentation

## 2016-08-03 DIAGNOSIS — Z79899 Other long term (current) drug therapy: Secondary | ICD-10-CM | POA: Diagnosis not present

## 2016-08-03 LAB — BASIC METABOLIC PANEL
Anion gap: 8 (ref 5–15)
BUN: 15 mg/dL (ref 6–20)
CHLORIDE: 98 mmol/L — AB (ref 101–111)
CO2: 25 mmol/L (ref 22–32)
CREATININE: 0.71 mg/dL (ref 0.44–1.00)
Calcium: 9 mg/dL (ref 8.9–10.3)
GFR calc Af Amer: >60 mL/min (ref >60)
GFR calc non Af Amer: >60 mL/min (ref >60)
Glucose, Bld: 89 mg/dL (ref 65–99)
Potassium: 3.8 mmol/L (ref 3.5–5.1)
Sodium: 131 mmol/L — ABNORMAL LOW (ref 135–145)

## 2016-08-03 LAB — CBC
HCT: 37 % (ref 36.0–46.0)
Hemoglobin: 12.9 g/dL (ref 12.0–15.0)
MCH: 30.7 pg (ref 26.0–34.0)
MCHC: 34.9 g/dL (ref 30.0–36.0)
MCV: 88.1 fL (ref 78.0–100.0)
PLATELETS: 231 10*3/uL (ref 150–400)
RBC: 4.2 MIL/uL (ref 3.87–5.11)
RDW: 13.1 % (ref 11.5–15.5)
WBC: 6.6 10*3/uL (ref 4.0–10.5)

## 2016-08-03 LAB — TROPONIN I
Troponin I: <0.03 ng/mL (ref <0.03)
Troponin I: <0.03 ng/mL (ref <0.03)

## 2016-08-03 NOTE — ED Notes (Signed)
ED MD at beside regarding AMA

## 2016-08-03 NOTE — ED Triage Notes (Addendum)
Pt reports sharp epigastric intermittent pain radiating up and across her chest.  Denies SOB, diaphoresis, N/V.  States that the pain woke her from her sleep about 1am and subsided around 8am today.  Denies any symptoms since that time.  Hx of same-reports non-conclusive cardiac work up. Pt did take asa.

## 2016-08-03 NOTE — ED Provider Notes (Signed)
[place] DEPT [facility] Provider Note   CSN: [id number] Arrival date & time: 08/03/16  1543  By signing my name below, I, Hansel Feinstein, attest that this documentation has been prepared under the direction and in the presence of Xochilt Conant, PA-C. Electronically Signed: Hansel Feinstein, ED Scribe. 08/03/16. 4:53 PM.    History   Chief Complaint Chief Complaint  Patient presents with  . Chest Pain    HPI Angela Gibbs is a 76 y.o. female with h/o HTN who presents to the Emergency Department complaining of intermittent, sharp, medial CP that began at 1 am this morning and resolved at 9 am. Pt states she is currently pain-free. She took one 325 mg ASA tablet and reports her pain alleviated after the medicine completely dissolved. She denies pain radiation, nausea, SOB or diaphoresis while her pain was present. Pt reports one episode of similar symptoms years ago and states she had an unremarkable cardiac work up. She last saw a cardiologist 2 years ago, at which time she had echo and stress test. Cardiologist stated no need for further work up or evaluation. No h/o MI, PE/DVT, CHF, recent head injury. She does report h/o GERD, for which she takes daily Prilosec. She is also medicated for HTN. She denies cough, hemoptysis, SOB, vision changes, HA, vomiting, diarrhea, constipation, abdominal pain, leg swelling, fever, coughing, sneezing, congestion, wheezing, calf pain, unilateral numbness or weakness.   The history is provided by the patient. No language interpreter was used.    Past Medical History:  Diagnosis Date  . Allergy   . Concussion   . Falls   . Head injury   . Herpes   . Hypertension   . Migraines   . Nerve damage   . Shingles     Patient Active Problem List   Diagnosis Date Noted  . Restrictive lung disease 05/13/2016  . Moderate persistent asthma without complication 36/14/4315  . Moderate persistent asthma with acute exacerbation 04/11/2016  . Current use of beta blocker  04/11/2016  . Other allergic rhinitis 04/11/2016  . Gastroesophageal reflux disease without esophagitis 04/11/2016  . Essential hypertension 02/20/2016  . Abdominal pain, acute, right upper quadrant 07/29/2013    Past Surgical History:  Procedure Laterality Date  . BRAIN SURGERY    . CRANIOTOMY FOR EXTRACRANIAL-INTRACRANIAL BYPASS    . EYE SURGERY  4.4.17   milia excision  . HIP SURGERY     intracranial surgery  . NERVE SURGERY    . ROOT CANAL    . TONSILLECTOMY      OB History    No data available       Home Medications    Prior to Admission medications   Medication Sig Start Date End Date Taking? Authorizing Provider  Ascorbic Acid (VITAMIN C) 100 MG tablet Take 1,000 mg by mouth daily. Reported on 05/24/2015    [provider]  butalbital-acetaminophen-caffeine (FIORICET, ESGIC) 50-325-40 MG per tablet Take by mouth 2 (two) times daily as needed for headache.    [provider]  BYSTOLIC 10 MG tablet TAKE 1 TABLET BY MOUTH EVERY DAY 07/16/16   Copland, Gay Filler, MD  Cholecalciferol (D 5000) 5000 UNITS TABS daily at 0600.    [provider]  co-enzyme Q-10 30 MG capsule Take 30 mg by mouth daily.    [provider]  erythromycin ophthalmic ointment 1 application.  05/22/15   [provider]  fluticasone (FLONASE) 50 MCG/ACT nasal spray Place 2 sprays into both nostrils daily. 04/11/16  Charlies Silvers, MD  furosemide (LASIX) 20 MG tablet Take 1 tablet (20 mg total) by mouth daily. Use on occasion as needed for swelling 10/11/15   Copland, Gay Filler, MD  levalbuterol [facility] HFA) 45 MCG/ACT inhaler Inhale 2 puffs into the lungs every 6 (six) hours as needed for wheezing. 04/11/16   Charlies Silvers, MD  lidocaine (LIDODERM) 5 % Place 1 patch onto the skin.     [provider]  montelukast (SINGULAIR) 10 MG tablet Take 1 tablet (10 mg total) by mouth at bedtime. 04/11/16   Charlies Silvers, MD  omeprazole (PRILOSEC OTC) 20 MG  tablet Take 30 mg by mouth daily.  12/10/10   [provider]  Prednicarbate 0.1 % CREA  03/15/12   [provider]  promethazine (PHENERGAN) 25 MG tablet Take 1 tablet (25 mg total) by mouth every 6 (six) hours as needed for nausea or vomiting. 04/16/13   Pollina, Gwenyth Allegra, MD  sodium chloride (V-R NASAL SPRAY SALINE) 0.65 % nasal spray Place 1 spray into the nose as needed. Use in both nostrils twice daily 06/11/16   Copland, Gay Filler, MD  valsartan (DIOVAN) 80 MG tablet Take 1 tablet (80 mg total) by mouth 2 (two) times daily. 06/11/16   Copland, Gay Filler, MD    Family History Family History  Problem Relation Age of Onset  . Heart disease Father   . Heart attack Father     Social History Social History  Substance Use Topics  . Smoking status: Never Smoker  . Smokeless tobacco: Never Used  . Alcohol use Yes     Comment: social     Allergies   Cephalosporins; Ciprofloxacin; Keflex [cephalexin]; Penicillins; Sulfacetamide sodium; Xopenex [levalbuterol hcl]; Pregabalin; Aspirin; Chromium; Erythromycin; Hydrochlorothiazide; Influenza vaccines; Iodinated diagnostic agents; Iodine; Lactose intolerance (gi); Latex; Lisinopril; Nitrofuran derivatives; Petrolatum; Shellfish allergy; Sulfa antibiotics; Troleandomycin; Zostavax [zoster vaccine live]; Avelox [moxifloxacin hcl in nacl]; Cefdinir; Eggs or egg-derived products; Levaquin [levofloxacin]; Percodan [oxycodone-aspirin]; and Prednisone   Review of Systems Review of Systems  Constitutional: Negative for diaphoresis and fever.  HENT: Negative for congestion and sneezing.   Eyes: Negative for visual disturbance.  Respiratory: Negative for cough, shortness of breath and wheezing.   Cardiovascular: Positive for chest pain (resolved). Negative for leg swelling.  Gastrointestinal: Negative for abdominal pain, constipation, diarrhea, nausea and vomiting.  Musculoskeletal: Negative for myalgias.  Neurological: Negative  for weakness, numbness and headaches.  All other systems reviewed and are negative.   Physical Exam Updated Vital Signs BP (!) 169/61   Pulse (!) 52   Temp 98.3 F (36.8 C) (Oral)   Resp 19   Ht 5\' 3"  (1.6 m)   Wt 81.6 kg (180 lb)   SpO2 98%   BMI 31.89 kg/m   Physical Exam  Constitutional: She is oriented to person, place, and time. She appears well-developed and well-nourished. No distress.  HENT:  Head: Normocephalic and atraumatic.  Nose: Nose normal.  Eyes: Conjunctivae and EOM are normal. Right eye exhibits no discharge. Left eye exhibits no discharge. No scleral icterus.  Neck: Normal range of motion. Neck supple.  Cardiovascular: Normal rate, regular rhythm, normal heart sounds and intact distal pulses.  Exam reveals no gallop and no friction rub.   No murmur heard. Pulmonary/Chest: Effort normal and breath sounds normal. No respiratory distress. She has no wheezes. She has no rales. She exhibits no tenderness.  Abdominal: Soft. Bowel sounds are normal. She exhibits no distension. There is no  tenderness. There is no guarding.  Musculoskeletal: Normal range of motion. She exhibits no edema.  No leg swelling.   Neurological: She is alert and oriented to person, place, and time. She exhibits normal muscle tone. Coordination normal.  Skin: Skin is warm and dry. No rash noted. She is not diaphoretic.  Psychiatric: She has a normal mood and affect.  Nursing note and vitals reviewed.    ED Treatments / Results   DIAGNOSTIC STUDIES: Oxygen Saturation is 100% on RA, normal by my interpretation.    COORDINATION OF CARE: 4:49 PM Discussed treatment plan with pt at bedside which includes lab work, CXR and pt agreed to plan.    Labs (all labs ordered are listed, but only abnormal results are displayed) Labs Reviewed  BASIC METABOLIC PANEL - Abnormal; Notable for the following:       Result Value   Sodium 131 (*)    Chloride 98 (*)    All other components within normal  limits  CBC  TROPONIN I  TROPONIN I    EKG  EKG Interpretation  Date/Time:  Sunday August 03 2016 15:51:51 EDT Ventricular Rate:  59 PR Interval:  172 QRS Duration: 86 QT Interval:  402 QTC Calculation: 397 R Axis:   79 Text Interpretation:  Sinus bradycardia Nonspecific ST abnormality Abnormal ECG No STEMI  No old tracing to compare Confirmed by Addison Lank [phone]) on 08/03/2016 5:48:32 PM       Radiology Dg Chest 2 View  Result Date: 08/03/2016 CLINICAL DATA:  76 y/o  F; chest pain. EXAM: CHEST  2 VIEW COMPARISON:  06/19/2016 chest radiograph. FINDINGS: Stable cardiac silhouette. Stable hiatal hernia. Stable severe S-shaped curvature of the thoracolumbar spine. No focal consolidation. No pleural effusion or pneumothorax. IMPRESSION: 1. No acute pulmonary process identified. 2. Stable severe scoliosis.  Stable hiatal hernia. Electronically Signed   By: Kristine Garbe M.D.   On: 08/03/2016 16:58    Procedures Procedures (including critical care time)  Medications Ordered in ED Medications - No data to display   Initial Impression / Assessment and Plan / ED Course  I have reviewed the triage vital signs and the nursing notes.  Pertinent labs & imaging results that were available during my care of the patient were reviewed by me and considered in my medical decision making (see chart for details).     Patient presents to ED for chest pain that began approx 15hrs ago and subsided approx 7hrs ago. She denies SOB, trouble breathing, hemoptysis, leg swelling, palpitations, history of PE/DVT, history of malignancy. She reports being pain free at the moment. Previous history of similar symptoms many years ago. She denies changes in pain with movement. Unsure if related to stress/anxiety although she states she does feel more anxious recently. Was seen by cardiologist for several years with negative workup including echo and stress test. No prior history of MI, CVA, PE, DVT.  EKG showed nonspecific ST abnormality but no STEMI. Troponin negative x1. CBC, BMP unremarkable. CXR negative for acute cardiopulmonary disease. SpO2 98-99% here in ED. No respiratory distress noted, and lungs CTAB.  Patient's HEART score is 4. She is moderate risk for adverse cardiac event. I advised her that due to her age and comorbidities, we will obtain delta troponin at 3hrs after initial. She states that she has family waiting for her and would like to leave. She states that she does not mind getting the troponin drawn now (2hrs after initial one) and be contacted should it  be elevated. I urged her to stay until the results were obtained, in case it was elevated and to not further risk any life threatening condition if she leaves early. I educated her on the importance and severity of the possible condition that could result if we do not complete this evaluation fully. She insisted that she would like to leave because she is pain free and "feels great." Inquired about rx of Xanax to help with anxiety. I advised her to inform her PCP of this and they will treat as they deem necessary. She is agreeable to strict return precautions and f/u with PCP. I informed her that we will call her if troponin is elevated, and again urged her to stay. Patient elected to leave AMA regardless.  Delta troponin at 2hrs remained negative.  Patient discussed with Dr. Leonette Monarch.  Final Clinical Impressions(s) / ED Diagnoses   Final diagnoses:  Chest wall pain    New Prescriptions Discharge Medication List as of 08/03/2016  6:49 PM      I personally performed the services described in this documentation, which was scribed in my presence. The recorded information has been reviewed and is accurate.    Delia Heady, PA-C 08/04/16 1352    Fatima Blank, MD 08/06/16 [phone]

## 2016-08-03 NOTE — ED Notes (Signed)
ED Provider at bedside. 

## 2016-08-03 NOTE — ED Notes (Signed)
Pt describes onset sharp CP under left breast early this a.m. 0100. Went to bed at [facility]. Denies other s/s.

## 2016-08-03 NOTE — ED Notes (Signed)
Requesting to AMA due to need to see family . Husband at bedside. PA spoke with at length regarding leaving . Denies pain or discomfort and states," I will come back if I start hurting but I feel great"

## 2016-08-15 ENCOUNTER — Encounter: Payer: Self-pay | Admitting: Family Medicine

## 2016-08-15 ENCOUNTER — Telehealth: Payer: Self-pay | Admitting: Family Medicine

## 2016-08-15 NOTE — Telephone Encounter (Signed)
-----   Message from Emi Holes, [place] sent at 08/13/2016 11:57 AM EDT ----- Regarding: Preferred alternative Received fax from [facility] stating that Bystolic 10 MG tablets not covered by patient's plan. The preferred alternative is ATENOLOL, CARVEDILOL, METOPROLOL SUCCINATE. Please advise.

## 2016-08-21 MED ORDER — METOPROLOL SUCCINATE ER 25 MG PO TB24: 25.0000 mg | ORAL_TABLET | Freq: Every day | ORAL | 3 refills | Status: DC

## 2016-08-29 MED ORDER — VALSARTAN 80 MG PO TABS: 80.0000 mg | ORAL_TABLET | Freq: Two times a day (BID) | ORAL | 1 refills | Status: DC

## 2016-09-01 ENCOUNTER — Ambulatory Visit (INDEPENDENT_AMBULATORY_CARE_PROVIDER_SITE_OTHER): Payer: Medicare Other | Admitting: Family Medicine

## 2016-09-01 ENCOUNTER — Encounter: Payer: Self-pay | Admitting: Family Medicine

## 2016-09-01 VITALS — BP 137/82 | HR 64 | Temp 98.0°F | Ht 62.5 in | Wt 185.6 lb

## 2016-09-01 DIAGNOSIS — F410 Panic disorder [episodic paroxysmal anxiety] without agoraphobia: Secondary | ICD-10-CM

## 2016-09-01 DIAGNOSIS — Z638 Other specified problems related to primary support group: Secondary | ICD-10-CM

## 2016-09-01 MED ORDER — ALPRAZOLAM 0.25 MG PO TABS: 0.2500 mg | ORAL_TABLET | Freq: Two times a day (BID) | ORAL | 1 refills | Status: DC | PRN

## 2016-09-01 NOTE — Progress Notes (Signed)
[place] at [facility] [address], [place], [place] [address] [phone]  Date:  09/01/2016   Name:  Angela Gibbs   DOB:  [birth year]   MRN:  [id number]  PCP:  Angela Mclean, MD    Chief Complaint: Follow-up (BP medication)   History of Present Illness:  Angela Gibbs is a 76 y.o. very pleasant female patient who presents with the following:  She was in the ER about a month ago with chest pain- she had a negative eval.  Dx with chest wall pain- troponin negative x2.    However looking back Angela Gibbs thinks that she may have had a panic attack. She had these in the past when her mother was ill.  Right now their son is looking at getting divorced.  This is really upsetting to Angela Gibbs over the last month or so- she loves her DIL and wonders if this is what has caused her CP.  Actually she just heard the news about possible divorce the night before the CP began!  She hopes that her son and his wife will be able to work things out  BP Readings from Last 3 Encounters:  09/01/16 137/82  08/03/16 (!) 169/61  06/19/16 116/74   She is on medication for her BP- toprol, valsartan Her BP was up in the ER but has otherwise been ok  No further episodes of chest pain since she was in the ER.  She is working on some IT trainer.  In the past one of her doctors had given her xanax to use on occasion.  She would like to have some of this medication to have on hand   NCCSR: she has been off the fentanyl for the last several months  Patient Active Problem List   Diagnosis Date Noted  . Restrictive lung disease 05/13/2016  . Moderate persistent asthma without complication 51/03/5850  . Moderate persistent asthma with acute exacerbation 04/11/2016  . Current use of beta blocker 04/11/2016  . Other allergic rhinitis 04/11/2016  . Gastroesophageal reflux disease without esophagitis 04/11/2016  . Essential hypertension 02/20/2016   . Abdominal pain, acute, right upper quadrant 07/29/2013    Past Medical History:  Diagnosis Date  . Allergy   . Concussion   . Falls   . Head injury   . Herpes   . Hypertension   . Migraines   . Nerve damage   . Shingles     Past Surgical History:  Procedure Laterality Date  . BRAIN SURGERY    . CRANIOTOMY FOR EXTRACRANIAL-INTRACRANIAL BYPASS    . EYE SURGERY  4.4.17   milia excision  . HIP SURGERY     intracranial surgery  . NERVE SURGERY    . ROOT CANAL    . TONSILLECTOMY      Social History  Substance Use Topics  . Smoking status: Never Smoker  . Smokeless tobacco: Never Used  . Alcohol use Yes     Comment: social    Family History  Problem Relation Age of Onset  . Heart disease Father   . Heart attack Father     Allergies  Allergen Reactions  . Cephalosporins Shortness Of Breath    Breathing difficulty  . Ciprofloxacin Hives and Shortness Of Breath  . Keflex [Cephalexin] Hives  . Penicillins Hives  . Sulfacetamide Sodium Hives  . Xopenex [Levalbuterol Hcl] Anxiety and Other (See Comments)    Tremors with violent body chattering and shaking  .  Pregabalin Other (See Comments)  . Aspirin Other (See Comments)    Stomach pain/burning  . Chromium Other (See Comments)    Blisters  . Erythromycin Other (See Comments)    "stomach issues"  . Hydrochlorothiazide Other (See Comments)    Migraine  . Influenza Vaccines Other (See Comments)    Numbness in legs  . Iodinated Diagnostic Agents     unknown  . Iodine   . Lactose Intolerance (Gi) Other (See Comments)    "stomach issues"  . Latex   . Lisinopril Cough  . Nitrofuran Derivatives Hives, Itching and Swelling  . Petrolatum Nausea Only  . Shellfish Allergy Hives  . Sulfa Antibiotics Hives    Other reaction(s): Other (See Comments) Flank pain  . Troleandomycin Other (See Comments)    Unknown  . Zostavax [Zoster Vaccine Live] Other (See Comments)    Pt has been told by specialists not to  receive vaccine d/t h/o herpes in bloodstream and shingles.  . Avelox [Moxifloxacin Hcl In Nacl] Nausea Only  . Cefdinir Nausea And Vomiting  . Eggs Or Egg-Derived Products     Per allergy testing, no known reaction. Pt states she was told she could probably eat 2-3 eggs weekly w/o issue.  . Levaquin [Levofloxacin] Hives, Palpitations and Other (See Comments)    Chest pain  . Percodan [Oxycodone-Aspirin] Nausea And Vomiting  . Prednisone Palpitations    No injections, can do the pills    Medication list has been reviewed and updated.  Current Outpatient Prescriptions on File Prior to Visit  Medication Sig Dispense Refill  . Ascorbic Acid (VITAMIN C) 100 MG tablet Take 1,000 mg by mouth daily. Reported on 05/24/2015    . butalbital-acetaminophen-caffeine (FIORICET, ESGIC) 50-325-40 MG per tablet Take by mouth 2 (two) times daily as needed for headache.    . Cholecalciferol (D 5000) 5000 UNITS TABS daily at 0600.    Marland Kitchen co-enzyme Q-10 30 MG capsule Take 30 mg by mouth daily.    Marland Kitchen erythromycin ophthalmic ointment 1 application.     . fluticasone (FLONASE) 50 MCG/ACT nasal spray Place 2 sprays into both nostrils daily. 16 g 5  . furosemide (LASIX) 20 MG tablet Take 1 tablet (20 mg total) by mouth daily. Use on occasion as needed for swelling 30 tablet 1  . levalbuterol (XOPENEX HFA) 45 MCG/ACT inhaler Inhale 2 puffs into the lungs every 6 (six) hours as needed for wheezing. 1 Inhaler 5  . lidocaine (LIDODERM) 5 % Place 1 patch onto the skin.     . metoprolol succinate (TOPROL-XL) 25 MG 24 hr tablet Take 1 tablet (25 mg total) by mouth daily. 90 tablet 3  . montelukast (SINGULAIR) 10 MG tablet Take 1 tablet (10 mg total) by mouth at bedtime. 30 tablet 5  . omeprazole (PRILOSEC OTC) 20 MG tablet Take 30 mg by mouth daily.     . Prednicarbate 0.1 % CREA     . promethazine (PHENERGAN) 25 MG tablet Take 1 tablet (25 mg total) by mouth every 6 (six) hours as needed for nausea or vomiting. 30 tablet 0   . sodium chloride (V-R NASAL SPRAY SALINE) 0.65 % nasal spray Place 1 spray into the nose as needed. Use in both nostrils twice daily 30 mL 6  . valsartan (DIOVAN) 80 MG tablet Take 1 tablet (80 mg total) by mouth 2 (two) times daily. 180 tablet 1   No current facility-administered medications on file prior to visit.     Review of  Systems:  As per HPI- otherwise negative.  No fever or chills She does not feel that she is depressed at this time    Physical Examination: Vitals:   09/01/16 1127  BP: 137/82  Pulse: 64  Temp: 98 F (36.7 C)   Vitals:   09/01/16 1127  Weight: 185 lb 9.6 oz (84.2 kg)  Height: 5' 2.5" (1.588 m)   Body mass index is 33.41 kg/m. Ideal Body Weight: Weight in (lb) to have BMI = 25: 138.6  GEN: WDWN, NAD, Non-toxic, A & O x 3 HEENT: Atraumatic, Normocephalic. Neck supple. No masses, No LAD. Ears and Nose: No external deformity. CV: RRR, No M/G/R. No JVD. No thrill. No extra heart sounds. PULM: CTA B, no wheezes, crackles, rhonchi. No retractions. No resp. distress. No accessory muscle use. ABD: S, NT, ND, +BS. No rebound. No HSM. EXTR: No c/c/e NEURO Normal gait.  PSYCH: Normally interactive. Conversant. Not depressed or anxious appearing.  Calm demeanor.    Assessment and Plan: Panic attack - Plan: ALPRAZolam (XANAX) 0.25 MG tablet  Stress due to family tension  Here today to discuss recent ER visit for CP.  She thinks that her pain was due to a panic attack and would like to use xanax as she has in the past which I think is reasonable discussed safe use  She is off the fentanyl patches See patient instructions for more details.     Signed Lamar Blinks, MD

## 2016-09-01 NOTE — Patient Instructions (Signed)
It was good to see you today- I hope that things work out ok with your son If you want to use an occasional xanax for anxiety or panic that is ok- remember it can make you feel drowsy, and can be habit forming so use it as little as you can  Take care and let me know if you need anything further

## 2016-09-11 ENCOUNTER — Other Ambulatory Visit: Payer: Self-pay | Admitting: Emergency Medicine

## 2016-09-15 ENCOUNTER — Encounter: Payer: Self-pay | Admitting: Family Medicine

## 2016-09-16 ENCOUNTER — Other Ambulatory Visit: Payer: Self-pay | Admitting: Family Medicine

## 2016-09-16 MED ORDER — LOSARTAN POTASSIUM 25 MG PO TABS: 25.0000 mg | ORAL_TABLET | Freq: Two times a day (BID) | ORAL | 0 refills | Status: DC

## 2016-09-16 NOTE — Telephone Encounter (Signed)
Pt actually on bid dosing. Okay per Dr. Charlett Blake send losartan 25mg  bid, #60 and 0 RF.

## 2016-09-16 NOTE — Telephone Encounter (Signed)
D/c valsartan and start Losartan 25 mg tabs, 1 tab po daily disp #30 to make sure tolerates

## 2016-09-18 ENCOUNTER — Encounter: Payer: Self-pay | Admitting: Family Medicine

## 2016-09-23 ENCOUNTER — Ambulatory Visit: Payer: Medicare Other | Admitting: Internal Medicine

## 2016-09-23 ENCOUNTER — Encounter: Payer: Self-pay | Admitting: Family Medicine

## 2016-09-23 ENCOUNTER — Ambulatory Visit (INDEPENDENT_AMBULATORY_CARE_PROVIDER_SITE_OTHER): Payer: Medicare Other | Admitting: Internal Medicine

## 2016-09-23 VITALS — BP 132/85 | HR 62

## 2016-09-23 DIAGNOSIS — I10 Essential (primary) hypertension: Secondary | ICD-10-CM

## 2016-09-23 NOTE — Progress Notes (Signed)
Patient in today with complaints that her BP was elevated to 240/188. Had appointment to see Dr. Larose Kells however she was late.  Patient denies SOB, Chest pain or any other complaints at this time. States he is concerned that she is not taking any BP mediations because she switched from Valsartan to Losartan and feels the Losartan made her BP elevated.    Per Dr. Serita Grit who is answering calls today states patient needs to start back on Losartan and schedule follow up appointment for next week.  Patient agreed and scheduled appointment for tomorrow with E. Saguier, PA-C.  Information also given to Dr. Larose Kells for review.

## 2016-09-24 ENCOUNTER — Telehealth: Payer: Self-pay | Admitting: *Deleted

## 2016-09-24 NOTE — Telephone Encounter (Signed)
Patient faxed BP readings; forwarded to provider/SLS 08/08

## 2016-09-25 ENCOUNTER — Ambulatory Visit: Payer: Medicare Other | Admitting: Medical

## 2016-09-25 ENCOUNTER — Encounter: Payer: Self-pay | Admitting: Family Medicine

## 2016-09-27 ENCOUNTER — Encounter: Payer: Self-pay | Admitting: Family Medicine

## 2016-09-29 MED ORDER — IRBESARTAN 150 MG PO TABS: 150.0000 mg | ORAL_TABLET | Freq: Every day | ORAL | 6 refills | Status: DC

## 2016-09-29 NOTE — Addendum Note (Signed)
Addended by: Lamar Blinks C on: 09/29/2016 09:12 PM   Modules accepted: Orders

## 2016-09-29 NOTE — Telephone Encounter (Signed)
Relation to RC:VKFM Call back number: [phone] Pharmacy: [facility], [place] - [address] AT [address] [phone] (Phone) [phone] (Fax)     Reason for call:  Patient checking on the status of my chart message below, please advise regarding new blood pressure medication

## 2016-10-16 ENCOUNTER — Telehealth: Payer: Self-pay | Admitting: Family Medicine

## 2016-10-16 ENCOUNTER — Encounter: Payer: Self-pay | Admitting: Family Medicine

## 2016-10-16 NOTE — Telephone Encounter (Signed)
bystolic not covered- [facility] gave options of atenolol, toprol xl, carvedilol Message to pt

## 2016-10-22 MED ORDER — ATENOLOL 25 MG PO TABS: 25.0000 mg | ORAL_TABLET | Freq: Every day | ORAL | 6 refills | Status: DC

## 2016-10-22 NOTE — Addendum Note (Signed)
Addended by: Lamar Blinks C on: 10/22/2016 09:12 PM   Modules accepted: Orders

## 2016-10-23 MED ORDER — IRBESARTAN 300 MG PO TABS: ORAL_TABLET | ORAL | 6 refills | Status: DC

## 2016-10-23 NOTE — Addendum Note (Signed)
Addended by: Lamar Blinks C on: 10/23/2016 08:34 AM   Modules accepted: Orders

## 2016-10-27 ENCOUNTER — Telehealth: Payer: Self-pay

## 2016-10-27 NOTE — Telephone Encounter (Signed)
Hi karen- don't see anything attached.  Am I supposed to call her? Thanks!! JC

## 2016-10-28 DIAGNOSIS — G894 Chronic pain syndrome: Secondary | ICD-10-CM | POA: Diagnosis not present

## 2016-10-28 DIAGNOSIS — M25551 Pain in right hip: Secondary | ICD-10-CM | POA: Diagnosis not present

## 2016-10-28 DIAGNOSIS — B0229 Other postherpetic nervous system involvement: Secondary | ICD-10-CM | POA: Diagnosis not present

## 2016-10-28 DIAGNOSIS — M545 Low back pain: Secondary | ICD-10-CM | POA: Diagnosis not present

## 2016-10-30 ENCOUNTER — Encounter: Payer: Self-pay | Admitting: Family Medicine

## 2016-11-19 ENCOUNTER — Telehealth: Payer: Self-pay | Admitting: Family Medicine

## 2016-11-19 NOTE — Telephone Encounter (Signed)
Debbie with [facility] [phone] called to request pt allergies, adv Debbie I would not be able to release patient information without a verbal auth. from patient. She was able to put the patient on the line, patient did verify HIPAA and gave a verbal auth to fax her med list to [facility].  Fax number [phone]

## 2016-11-21 ENCOUNTER — Other Ambulatory Visit: Payer: Self-pay | Admitting: Emergency Medicine

## 2016-11-21 ENCOUNTER — Telehealth: Payer: Self-pay | Admitting: Family Medicine

## 2016-11-21 NOTE — Telephone Encounter (Signed)
Caller name:George Macmurray Relationship to patient: Can be reached:[phone] Pharmacy:  Reason for call:Requesting to speak with [place] regarding a bill

## 2016-12-05 NOTE — Telephone Encounter (Signed)
Patients bill has been updated and re-filled with insurance, LM informing husband.

## 2017-01-20 DIAGNOSIS — L219 Seborrheic dermatitis, unspecified: Secondary | ICD-10-CM | POA: Diagnosis not present

## 2017-01-29 DIAGNOSIS — M62838 Other muscle spasm: Secondary | ICD-10-CM | POA: Diagnosis not present

## 2017-01-29 DIAGNOSIS — B0229 Other postherpetic nervous system involvement: Secondary | ICD-10-CM | POA: Diagnosis not present

## 2017-01-29 DIAGNOSIS — M545 Low back pain: Secondary | ICD-10-CM | POA: Diagnosis not present

## 2017-01-29 DIAGNOSIS — M25551 Pain in right hip: Secondary | ICD-10-CM | POA: Diagnosis not present

## 2017-03-03 NOTE — Progress Notes (Addendum)
Subjective:   Angela Gibbs is a 77 y.o. female who presents for an Initial Medicare Annual Wellness Visit. 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. The Patient was informed that the wellness visit is to identify future health risk and educate and initiate measures that can reduce risk for increased disease through the lifespan.   Describes health as fair, good or great? Great.  Review of Systems   No ROS.  Medicare Wellness Visit. Additional risk factors are reflected in the social history. Cardiac Risk Factors include: advanced age (>53men, >93 women);hypertension;sedentary lifestyle Sleep patterns: Sleep 7-8 hrs. Feels rested. Home Safety/Smoke Alarms: Feels safe in home. Smoke alarms in place.   Female:   Pap- Last yr per pt-normal. No longer doing routine screening due to age.    Mammo- per pt 11/17/16-normal      Dexa scan- 2017 per pt.     CCS- last reported by pt- 2009 normal. Will discuss with PCP Objective:    Today's Vitals   03/06/17 1609  BP: 138/76  Pulse: 61  SpO2: 97%  Weight: 190 lb (86.2 kg)   Body mass index is 34.2 kg/m.  Advanced Directives 03/06/2017 08/03/2016 11/24/2014  Does Patient Have a Medical Advance Directive? Yes No Yes  Type of Paramedic of [place];Living will - [place];Living will  Does patient want to make changes to medical advance directive? No - Patient declined - -  Copy of [place] in Chart? No - copy requested - -    Current Medications (verified) Outpatient Encounter Medications as of 03/06/2017  Medication Sig  . ALPRAZolam (XANAX) 0.25 MG tablet Take 1 tablet (0.25 mg total) by mouth 2 (two) times daily as needed for anxiety.  . Ascorbic Acid (VITAMIN C) 100 MG tablet Take 1,000 mg by mouth daily. Reported on 05/24/2015  . butalbital-acetaminophen-caffeine (FIORICET, ESGIC) 50-325-40 MG per tablet Take by mouth 2 (two) times daily as needed for headache.  Marland Kitchen BYSTOLIC 10 MG  tablet Take 1 tablet by mouth daily.  . Cholecalciferol (D 5000) 5000 UNITS TABS daily at 0600.  Marland Kitchen co-enzyme Q-10 30 MG capsule Take 30 mg by mouth daily.  Marland Kitchen erythromycin ophthalmic ointment 1 application.   . fluticasone (FLONASE) 50 MCG/ACT nasal spray Place 2 sprays into both nostrils daily.  Marland Kitchen levalbuterol (XOPENEX HFA) 45 MCG/ACT inhaler Inhale 2 puffs into the lungs every 6 (six) hours as needed for wheezing.  Marland Kitchen omeprazole (PRILOSEC OTC) 20 MG tablet Take 30 mg by mouth daily.   . Prednicarbate 0.1 % CREA   . sodium chloride (V-R NASAL SPRAY SALINE) 0.65 % nasal spray Place 1 spray into the nose as needed. Use in both nostrils twice daily  . valsartan (DIOVAN) 80 MG tablet Take 160 mg by mouth daily.  Marland Kitchen atenolol (TENORMIN) 25 MG tablet Take 1 tablet (25 mg total) by mouth daily. May increase to 2 tablets if needed (Patient not taking: Reported on 03/06/2017)  . furosemide (LASIX) 20 MG tablet Take 1 tablet (20 mg total) by mouth daily. Use on occasion as needed for swelling (Patient not taking: Reported on 03/06/2017)  . irbesartan (AVAPRO) 300 MG tablet Take 1 by mouth daily (Patient not taking: Reported on 03/06/2017)  . lidocaine (LIDODERM) 5 % Place 1 patch onto the skin.   . montelukast (SINGULAIR) 10 MG tablet Take 1 tablet (10 mg total) by mouth at bedtime. (Patient not taking: Reported on 03/06/2017)  . promethazine (PHENERGAN) 25 MG tablet Take 1  tablet (25 mg total) by mouth every 6 (six) hours as needed for nausea or vomiting. (Patient not taking: Reported on 03/06/2017)   No facility-administered encounter medications on file as of 03/06/2017.     Allergies (verified) Cephalosporins; Ciprofloxacin; Keflex [cephalexin]; Penicillins; Sulfacetamide sodium; Xopenex [levalbuterol hcl]; Pregabalin; Aspirin; Chromium; Erythromycin; Hydrochlorothiazide; Influenza vaccines; Iodinated diagnostic agents; Iodine; Lactose intolerance (gi); Latex; Lisinopril; Nitrofuran derivatives; Petrolatum;  Shellfish allergy; Sulfa antibiotics; Troleandomycin; Zostavax [zoster vaccine live]; Avelox [moxifloxacin hcl in nacl]; Cefdinir; Eggs or egg-derived products; Levaquin [levofloxacin]; Percodan [oxycodone-aspirin]; and Prednisone   History: Past Medical History:  Diagnosis Date  . Allergy   . Concussion   . Falls   . Head injury   . Herpes   . Hypertension   . Migraines   . Nerve damage   . Shingles    Past Surgical History:  Procedure Laterality Date  . BRAIN SURGERY    . CRANIOTOMY FOR EXTRACRANIAL-INTRACRANIAL BYPASS    . EYE SURGERY  4.4.17   milia excision  . HIP SURGERY     intracranial surgery  . NERVE SURGERY    . ROOT CANAL    . TONSILLECTOMY     Family History  Problem Relation Age of Onset  . Heart disease Father   . Heart attack Father    Social History   Socioeconomic History  . Marital status: Married    Spouse name: None  . Number of children: None  . Years of education: None  . Highest education level: None  Social Needs  . Financial resource strain: None  . Food insecurity - worry: None  . Food insecurity - inability: None  . Transportation needs - medical: None  . Transportation needs - non-medical: None  Occupational History  . None  Tobacco Use  . Smoking status: Never Smoker  . Smokeless tobacco: Never Used  Substance and Sexual Activity  . Alcohol use: Yes    Comment: social  . Drug use: No  . Sexual activity: Yes  Other Topics Concern  . None  Social History Narrative  . None    Tobacco Counseling Counseling given: Not Answered   Clinical Intake: Pain : No/denies pain     Activities of Daily Living In your present state of health, do you have any difficulty performing the following activities: 03/06/2017  Hearing? N  Vision? N  Difficulty concentrating or making decisions? N  Walking or climbing stairs? N  Dressing or bathing? N  Doing errands, shopping? N  Preparing Food and eating ? N  Using the Toilet? N  In  the past six months, have you accidently leaked urine? N  Do you have problems with loss of bowel control? N  Managing your Medications? N  Managing your Finances? N  Housekeeping or managing your Housekeeping? N  Some recent data might be hidden     Immunizations and Health Maintenance Immunization History  Administered Date(s) Administered  . Pneumococcal Conjugate-13 11/10/2013, 02/20/2016  . Td 06/11/2016  . Tdap 02/17/2006   Health Maintenance Due  Topic Date Due  . PNA vac Low Risk Adult (2 of 2 - PPSV23) 02/19/2017    Patient Care Team: Copland, Gay Filler, MD as PCP - General (Family Medicine) Luellen Pucker, MD as Consulting Physician (Obstetrics and Gynecology) Rosendo Gros, Marnee Spring, OD as Consulting Physician (Ophthalmology) Sabra Heck Precious Haws, MD as Consulting Physician (Neurology) Sylvie Farrier, NP as Consulting Physician (Pain Medicine) Rolan Lipa, MD as Consulting Physician (Gastroenterology) Kerney Elbe, MD as Consulting Physician (  Internal Medicine) Lucia Bitter., MD as Physician Assistant (Pain Medicine)  Indicate any recent Medical Services you may have received from other than [facility] providers in the past year (date may be approximate).     Assessment:   This is a routine wellness examination for Jakyla. Physical assessment deferred to PCP.  Dietary issues and exercise activities discussed: Current Exercise Habits: Home exercise routine, Type of exercise: walking, Time (Minutes): 60, Frequency (Times/Week): 7, Weekly Exercise (Minutes/Week): 420  Goals    . Lose 20lb     Follow weight watchers or other healthy diet      Depression Screen PHQ 2/9 Scores 03/06/2017 05/24/2015 05/24/2015  PHQ - 2 Score 0 0 -  Exception Documentation - Patient refusal Patient refusal    Fall Risk Fall Risk  03/06/2017 05/24/2015 05/24/2015  Falls in the past year? Yes No No  Number falls in past yr: 1 - -  Injury with Fall? No - -  Follow up  Education provided;Falls prevention discussed - -    Cognitive Function:Ad8 score reviewed for issues:  Issues making decisions:no  Less interest in hobbies / activities:no  Repeats questions, stories (family complaining):no  Trouble using ordinary gadgets (microwave, computer, phone):no  Forgets the month or year: no  Mismanaging finances: no  Remembering appts:no  Daily problems with thinking and/or memory:no Ad8 score is=0        Screening Tests Health Maintenance  Topic Date Due  . PNA vac Low Risk Adult (2 of 2 - PPSV23) 02/19/2017  . TETANUS/TDAP  06/12/2026  . DEXA SCAN  Completed     Plan:   Please schedule follow up appointment with Dr.Copland.  Continue to eat heart healthy diet (full of fruits, vegetables, whole grains, lean protein, water--limit salt, fat, and sugar intake) and increase physical activity as tolerated.  Continue doing brain stimulating activities (puzzles, reading, adult coloring books, staying active) to keep memory sharp.   Bring a copy of your living will and/or healthcare power of attorney to your next office visit.  I have personally reviewed and noted the following in the patient's chart:   . Medical and social history . Use of alcohol, tobacco or illicit drugs  . Current medications and supplements . Functional ability and status . Nutritional status . Physical activity . Advanced directives . List of other physicians . Hospitalizations, surgeries, and ER visits in previous 12 months . Vitals . Screenings to include cognitive, depression, and falls . Referrals and appointments  In addition, I have reviewed and discussed with patient certain preventive protocols, quality metrics, and best practice recommendations. A written personalized care plan for preventive services as well as general preventive health recommendations were provided to patient.     [place], [place]   03/06/2017   Kathlene November, MD

## 2017-03-06 ENCOUNTER — Encounter: Payer: Self-pay | Admitting: *Deleted

## 2017-03-06 ENCOUNTER — Ambulatory Visit (INDEPENDENT_AMBULATORY_CARE_PROVIDER_SITE_OTHER): Payer: Medicare Other | Admitting: *Deleted

## 2017-03-06 VITALS — BP 138/76 | HR 61 | Wt 190.0 lb

## 2017-03-06 DIAGNOSIS — Z0000 Encounter for general adult medical examination without abnormal findings: Secondary | ICD-10-CM

## 2017-03-06 NOTE — Patient Instructions (Signed)
Please schedule follow up appointment with Dr.Copland.  Continue to eat heart healthy diet (full of fruits, vegetables, whole grains, lean protein, water--limit salt, fat, and sugar intake) and increase physical activity as tolerated.  Continue doing brain stimulating activities (puzzles, reading, adult coloring books, staying active) to keep memory sharp.   Bring a copy of your living will and/or healthcare power of attorney to your next office visit.   Angela Gibbs , Thank you for taking time to come for your Medicare Wellness Visit. I appreciate your ongoing commitment to your health goals. Please review the following plan we discussed and let me know if I can assist you in the future.   These are the goals we discussed: Goals    . Lose 20lb     Follow weight watchers or other healthy diet       This is a list of the screening recommended for you and due dates:  Health Maintenance  Topic Date Due  . Pneumonia vaccines (2 of 2 - PPSV23) 02/19/2017  . Tetanus Vaccine  06/12/2026  . DEXA scan (bone density measurement)  Completed    Health Maintenance for Postmenopausal Women Menopause is a normal process in which your reproductive ability comes to an end. This process happens gradually over a span of months to years, usually between the ages of 64 and 77. Menopause is complete when you have missed 12 consecutive menstrual periods. It is important to talk with your health care provider about some of the most common conditions that affect postmenopausal women, such as heart disease, cancer, and bone loss (osteoporosis). Adopting a healthy lifestyle and getting preventive care can help to promote your health and wellness. Those actions can also lower your chances of developing some of these common conditions. What should I know about menopause? During menopause, you may experience a number of symptoms, such as:  Moderate-to-severe hot flashes.  Night sweats.  Decrease in sex  drive.  Mood swings.  Headaches.  Tiredness.  Irritability.  Memory problems.  Insomnia.  Choosing to treat or not to treat menopausal changes is an individual decision that you make with your health care provider. What should I know about hormone replacement therapy and supplements? Hormone therapy products are effective for treating symptoms that are associated with menopause, such as hot flashes and night sweats. Hormone replacement carries certain risks, especially as you become older. If you are thinking about using estrogen or estrogen with progestin treatments, discuss the benefits and risks with your health care provider. What should I know about heart disease and stroke? Heart disease, heart attack, and stroke become more likely as you age. This may be due, in part, to the hormonal changes that your body experiences during menopause. These can affect how your body processes dietary fats, triglycerides, and cholesterol. Heart attack and stroke are both medical emergencies. There are many things that you can do to help prevent heart disease and stroke:  Have your blood pressure checked at least every 1-2 years. High blood pressure causes heart disease and increases the risk of stroke.  If you are 74-52 years old, ask your health care provider if you should take aspirin to prevent a heart attack or a stroke.  Do not use any tobacco products, including cigarettes, chewing tobacco, or electronic cigarettes. If you need help quitting, ask your health care provider.  It is important to eat a healthy diet and maintain a healthy weight. ? Be sure to include plenty of vegetables, fruits, low-fat  dairy products, and lean protein. ? Avoid eating foods that are high in solid fats, added sugars, or salt (sodium).  Get regular exercise. This is one of the most important things that you can do for your health. ? Try to exercise for at least 150 minutes each week. The type of exercise that  you do should increase your heart rate and make you sweat. This is known as moderate-intensity exercise. ? Try to do strengthening exercises at least twice each week. Do these in addition to the moderate-intensity exercise.  Know your numbers.Ask your health care provider to check your cholesterol and your blood glucose. Continue to have your blood tested as directed by your health care provider.  What should I know about cancer screening? There are several types of cancer. Take the following steps to reduce your risk and to catch any cancer development as early as possible. Breast Cancer  Practice breast self-awareness. ? This means understanding how your breasts normally appear and feel. ? It also means doing regular breast self-exams. Let your health care provider know about any changes, no matter how small.  If you are 79 or older, have a clinician do a breast exam (clinical breast exam or CBE) every year. Depending on your age, family history, and medical history, it may be recommended that you also have a yearly breast X-ray (mammogram).  If you have a family history of breast cancer, talk with your health care provider about genetic screening.  If you are at high risk for breast cancer, talk with your health care provider about having an MRI and a mammogram every year.  Breast cancer (BRCA) gene test is recommended for women who have family members with BRCA-related cancers. Results of the assessment will determine the need for genetic counseling and BRCA1 and for BRCA2 testing. BRCA-related cancers include these types: ? Breast. This occurs in males or females. ? Ovarian. ? Tubal. This may also be called fallopian tube cancer. ? Cancer of the abdominal or pelvic lining (peritoneal cancer). ? Prostate. ? Pancreatic.  Cervical, Uterine, and Ovarian Cancer Your health care provider may recommend that you be screened regularly for cancer of the pelvic organs. These include your  ovaries, uterus, and vagina. This screening involves a pelvic exam, which includes checking for microscopic changes to the surface of your cervix (Pap test).  For women ages 21-65, health care providers may recommend a pelvic exam and a Pap test every three years. For women ages 30-65, they may recommend the Pap test and pelvic exam, combined with testing for human papilloma virus (HPV), every five years. Some types of HPV increase your risk of cervical cancer. Testing for HPV may also be done on women of any age who have unclear Pap test results.  Other health care providers may not recommend any screening for nonpregnant women who are considered low risk for pelvic cancer and have no symptoms. Ask your health care provider if a screening pelvic exam is right for you.  If you have had past treatment for cervical cancer or a condition that could lead to cancer, you need Pap tests and screening for cancer for at least 20 years after your treatment. If Pap tests have been discontinued for you, your risk factors (such as having a new sexual partner) need to be reassessed to determine if you should start having screenings again. Some women have medical problems that increase the chance of getting cervical cancer. In these cases, your health care provider may recommend  that you have screening and Pap tests more often.  If you have a family history of uterine cancer or ovarian cancer, talk with your health care provider about genetic screening.  If you have vaginal bleeding after reaching menopause, tell your health care provider.  There are currently no reliable tests available to screen for ovarian cancer.  Lung Cancer Lung cancer screening is recommended for adults 63-12 years old who are at high risk for lung cancer because of a history of smoking. A yearly low-dose CT scan of the lungs is recommended if you:  Currently smoke.  Have a history of at least 30 pack-years of smoking and you currently  smoke or have quit within the past 15 years. A pack-year is smoking an average of one pack of cigarettes per day for one year.  Yearly screening should:  Continue until it has been 15 years since you quit.  Stop if you develop a health problem that would prevent you from having lung cancer treatment.  Colorectal Cancer  This type of cancer can be detected and can often be prevented.  Routine colorectal cancer screening usually begins at age 52 and continues through age 34.  If you have risk factors for colon cancer, your health care provider may recommend that you be screened at an earlier age.  If you have a family history of colorectal cancer, talk with your health care provider about genetic screening.  Your health care provider may also recommend using home test kits to check for hidden blood in your stool.  A small camera at the end of a tube can be used to examine your colon directly (sigmoidoscopy or colonoscopy). This is done to check for the earliest forms of colorectal cancer.  Direct examination of the colon should be repeated every 5-10 years until age 56. However, if early forms of precancerous polyps or small growths are found or if you have a family history or genetic risk for colorectal cancer, you may need to be screened more often.  Skin Cancer  Check your skin from head to toe regularly.  Monitor any moles. Be sure to tell your health care provider: ? About any new moles or changes in moles, especially if there is a change in a mole's shape or color. ? If you have a mole that is larger than the size of a pencil eraser.  If any of your family members has a history of skin cancer, especially at a young age, talk with your health care provider about genetic screening.  Always use sunscreen. Apply sunscreen liberally and repeatedly throughout the day.  Whenever you are outside, protect yourself by wearing long sleeves, pants, a wide-brimmed hat, and  sunglasses.  What should I know about osteoporosis? Osteoporosis is a condition in which bone destruction happens more quickly than new bone creation. After menopause, you may be at an increased risk for osteoporosis. To help prevent osteoporosis or the bone fractures that can happen because of osteoporosis, the following is recommended:  If you are 93-75 years old, get at least 1,000 mg of calcium and at least 600 mg of vitamin D per day.  If you are older than age 64 but younger than age 43, get at least 1,200 mg of calcium and at least 600 mg of vitamin D per day.  If you are older than age 39, get at least 1,200 mg of calcium and at least 800 mg of vitamin D per day.  Smoking and excessive alcohol intake  increase the risk of osteoporosis. Eat foods that are rich in calcium and vitamin D, and do weight-bearing exercises several times each week as directed by your health care provider. What should I know about how menopause affects my mental health? Depression may occur at any age, but it is more common as you become older. Common symptoms of depression include:  Low or sad mood.  Changes in sleep patterns.  Changes in appetite or eating patterns.  Feeling an overall lack of motivation or enjoyment of activities that you previously enjoyed.  Frequent crying spells.  Talk with your health care provider if you think that you are experiencing depression. What should I know about immunizations? It is important that you get and maintain your immunizations. These include:  Tetanus, diphtheria, and pertussis (Tdap) booster vaccine.  Influenza every year before the flu season begins.  Pneumonia vaccine.  Shingles vaccine.  Your health care provider may also recommend other immunizations. This information is not intended to replace advice given to you by your health care provider. Make sure you discuss any questions you have with your health care provider. Document Released:  03/28/2005 Document Revised: 08/24/2015 Document Reviewed: 11/07/2014 Elsevier Interactive Patient Education  2018 [employer].

## 2017-03-10 NOTE — Progress Notes (Signed)
[facility] [place], [place], [place] [address] [phone]  Date:  03/11/2017   Name:  Angela Gibbs   DOB:  10/14/1940   MRN:  [id number]  PCP:  Darreld Mclean, MD    Chief Complaint: Follow-up (Pt here for BP f/u visit. )   History of Present Illness:  Angela Gibbs is a 77 y.o. very pleasant female patient who presents with the following:  I last saw her over the summer when she was having some CP, though to be due to anxiety about her son perhaps- he is getting divorced and Xiao has been pretty upset about this  Here today to check on her BP  She saw Glenard Haring for her MWE about a week ago Her BP at home is running 150/80s in the am. After she takes her meds will be about 120-130s/80s.   However when she will check her BP in the later evening and evening her SBP maybe up to 180 or so   She has had HTN for several years   BP Readings from Last 3 Encounters:  03/11/17 134/82  03/06/17 138/76  09/23/16 132/85   She takes her valsartan 287 am bystolic at night 10 She uses lasix on occasion for edema but not regularly  Besides issues in her family she is feeling well and doing well   Patient Active Problem List   Diagnosis Date Noted  . Restrictive lung disease 05/13/2016  . Moderate persistent asthma without complication 86/76/7209  . Moderate persistent asthma with acute exacerbation 04/11/2016  . Current use of beta blocker 04/11/2016  . Other allergic rhinitis 04/11/2016  . Gastroesophageal reflux disease without esophagitis 04/11/2016  . Essential hypertension 02/20/2016  . Abdominal pain, acute, right upper quadrant 07/29/2013    Past Medical History:  Diagnosis Date  . Allergy   . Concussion   . Falls   . Head injury   . Herpes   . Hypertension   . Migraines   . Nerve damage   . Shingles     Past Surgical History:  Procedure Laterality Date  . BRAIN SURGERY    . CRANIOTOMY  FOR EXTRACRANIAL-INTRACRANIAL BYPASS    . EYE SURGERY  4.4.17   milia excision  . HIP SURGERY     intracranial surgery  . NERVE SURGERY    . ROOT CANAL    . TONSILLECTOMY      Social History   Tobacco Use  . Smoking status: Never Smoker  . Smokeless tobacco: Never Used  Substance Use Topics  . Alcohol use: Yes    Comment: social  . Drug use: No    Family History  Problem Relation Age of Onset  . Heart disease Father   . Heart attack Father     Allergies  Allergen Reactions  . Cephalosporins Shortness Of Breath    Breathing difficulty  . Ciprofloxacin Hives and Shortness Of Breath  . Keflex [Cephalexin] Hives  . Penicillins Hives  . Sulfacetamide Sodium Hives  . Xopenex [Levalbuterol Hcl] Anxiety and Other (See Comments)    Tremors with violent body chattering and shaking  . Pregabalin Other (See Comments)  . Aspirin Other (See Comments)    Stomach pain/burning  . Chromium Other (See Comments)    Blisters  . Erythromycin Other (See Comments)    "stomach issues"  . Hydrochlorothiazide Other (See Comments)    Migraine  . Influenza Vaccines Other (See Comments)  Numbness in legs  . Iodinated Diagnostic Agents     unknown  . Iodine   . Lactose Intolerance (Gi) Other (See Comments)    "stomach issues"  . Latex   . Lisinopril Cough  . Nitrofuran Derivatives Hives, Itching and Swelling  . Petrolatum Nausea Only  . Shellfish Allergy Hives  . Sulfa Antibiotics Hives    Other reaction(s): Other (See Comments) Flank pain  . Troleandomycin Other (See Comments)    Unknown  . Zostavax [Zoster Vaccine Live] Other (See Comments)    Pt has been told by specialists not to receive vaccine d/t h/o herpes in bloodstream and shingles.  . Avelox [Moxifloxacin Hcl In Nacl] Nausea Only  . Cefdinir Nausea And Vomiting  . Eggs Or Egg-Derived Products     Per allergy testing, no known reaction. Pt states she was told she could probably eat 2-3 eggs weekly w/o issue.  .  Levaquin [Levofloxacin] Hives, Palpitations and Other (See Comments)    Chest pain  . Percodan [Oxycodone-Aspirin] Nausea And Vomiting  . Prednisone Palpitations    No injections, can do the pills    Medication list has been reviewed and updated.  Current Outpatient Medications on File Prior to Visit  Medication Sig Dispense Refill  . ALPRAZolam (XANAX) 0.25 MG tablet Take 1 tablet (0.25 mg total) by mouth 2 (two) times daily as needed for anxiety. 20 tablet 1  . Ascorbic Acid (VITAMIN C) 100 MG tablet Take 1,000 mg by mouth daily. Reported on 05/24/2015    . butalbital-acetaminophen-caffeine (FIORICET, ESGIC) 50-325-40 MG per tablet Take by mouth 2 (two) times daily as needed for headache.    Marland Kitchen BYSTOLIC 10 MG tablet Take 1 tablet by mouth daily.  0  . Cholecalciferol (D 5000) 5000 UNITS TABS daily at 0600.    Marland Kitchen co-enzyme Q-10 30 MG capsule Take 30 mg by mouth daily.    Marland Kitchen erythromycin ophthalmic ointment 1 application.     . fluticasone (FLONASE) 50 MCG/ACT nasal spray Place 2 sprays into both nostrils daily. 16 g 5  . furosemide (LASIX) 20 MG tablet Take 1 tablet (20 mg total) by mouth daily. Use on occasion as needed for swelling 30 tablet 1  . levalbuterol (XOPENEX HFA) 45 MCG/ACT inhaler Inhale 2 puffs into the lungs every 6 (six) hours as needed for wheezing. 1 Inhaler 5  . lidocaine (LIDODERM) 5 % Place 1 patch onto the skin.     . montelukast (SINGULAIR) 10 MG tablet Take 1 tablet (10 mg total) by mouth at bedtime. 30 tablet 5  . omeprazole (PRILOSEC OTC) 20 MG tablet Take 30 mg by mouth daily.     . Prednicarbate 0.1 % CREA     . promethazine (PHENERGAN) 25 MG tablet Take 1 tablet (25 mg total) by mouth every 6 (six) hours as needed for nausea or vomiting. 30 tablet 0  . sodium chloride (V-R NASAL SPRAY SALINE) 0.65 % nasal spray Place 1 spray into the nose as needed. Use in both nostrils twice daily 30 mL 6  . valsartan (DIOVAN) 80 MG tablet Take 160 mg by mouth daily.     No  current facility-administered medications on file prior to visit.     Review of Systems:  As per HPI- otherwise negative. No fever or chills No CP or SOB   Physical Examination: Vitals:   03/11/17 1203  BP: 134/82  Pulse: 66  Temp: 97.8 F (36.6 C)  SpO2: 100%   Vitals:   03/11/17  1203  Weight: 190 lb 3.2 oz (86.3 kg)  Height: 5' 2.5" (1.588 m)   Body mass index is 34.23 kg/m. Ideal Body Weight: Weight in (lb) to have BMI = 25: 138.6  GEN: WDWN, NAD, Non-toxic, A & O x 3, obese, looks well  HEENT: Atraumatic, Normocephalic. Neck supple. No masses, No LAD. Bilateral TM wnl, oropharynx normal.  PEERL,EOMI.   Ears and Nose: No external deformity. CV: RRR, No M/G/R. No JVD. No thrill. No extra heart sounds. PULM: CTA B, no wheezes, crackles, rhonchi. No retractions. No resp. distress. No accessory muscle use. ABD: S, NT, ND, +BS. No rebound. No HSM. EXTR: No c/c/e NEURO Normal gait.  PSYCH: Normally interactive. Conversant. Not depressed or anxious appearing.  Calm demeanor.    Assessment and Plan: Essential hypertension - Plan: amLODipine (NORVASC) 5 MG tablet  Here today to discuss concern about her BP being incompletely controlled Decided to add norvasc- gave her 5 mg,she will start with 2.5 mg and see how this does for her.  She will let me know how it works for her   Asked her to come and see me in 2-3 months for labs, etc  Signed Lamar Blinks, MD

## 2017-03-11 ENCOUNTER — Encounter: Payer: Self-pay | Admitting: Family Medicine

## 2017-03-11 ENCOUNTER — Ambulatory Visit (INDEPENDENT_AMBULATORY_CARE_PROVIDER_SITE_OTHER): Payer: Medicare Other | Admitting: Family Medicine

## 2017-03-11 VITALS — BP 134/82 | HR 66 | Temp 97.8°F | Ht 62.5 in | Wt 190.2 lb

## 2017-03-11 DIAGNOSIS — I10 Essential (primary) hypertension: Secondary | ICD-10-CM

## 2017-03-11 MED ORDER — AMLODIPINE BESYLATE 5 MG PO TABS: 5.0000 mg | ORAL_TABLET | Freq: Every day | ORAL | 6 refills | Status: DC

## 2017-03-11 NOTE — Patient Instructions (Signed)
It was good to see you today but I am sorry that you have this stress in your family We will start you on amlodipine 5 mg daily to help with your BP- continue valsartan and bystolic as you are doing

## 2017-05-11 DIAGNOSIS — B0229 Other postherpetic nervous system involvement: Secondary | ICD-10-CM | POA: Diagnosis not present

## 2017-05-11 DIAGNOSIS — M62838 Other muscle spasm: Secondary | ICD-10-CM | POA: Diagnosis not present

## 2017-05-11 DIAGNOSIS — M25551 Pain in right hip: Secondary | ICD-10-CM | POA: Diagnosis not present

## 2017-05-11 DIAGNOSIS — M545 Low back pain: Secondary | ICD-10-CM | POA: Diagnosis not present

## 2017-05-15 ENCOUNTER — Other Ambulatory Visit: Payer: Self-pay | Admitting: Family Medicine

## 2017-05-15 MED ORDER — VALSARTAN 80 MG PO TABS: 160.0000 mg | ORAL_TABLET | Freq: Every day | ORAL | 2 refills | Status: DC

## 2017-05-15 NOTE — Telephone Encounter (Signed)
LOV  03/11/17 Dr. Lorelei Pont - No provider on med list [facility]

## 2017-05-15 NOTE — Telephone Encounter (Signed)
Copied from [place]. Topic: Quick Communication - Rx Refill/Question >> May 15, 2017  1:04 PM Wynetta Emery, [place] C wrote:   Medication: valsartan (DIOVAN) 80 MG tablet  Has the patient contacted their pharmacy? Yes- pt says that pharmacy has been sending request (not showing) pt says that she will be out today.   (Agent: If no, request that the patient contact the pharmacy for the refill.)  Preferred Pharmacy (with phone number or street name): [facility], [place] [address]  Agent: Please be advised that RX refills may take up to 3 business days. We ask that you follow-up with your pharmacy.

## 2017-05-25 DIAGNOSIS — L570 Actinic keratosis: Secondary | ICD-10-CM | POA: Diagnosis not present

## 2017-05-25 DIAGNOSIS — L820 Inflamed seborrheic keratosis: Secondary | ICD-10-CM | POA: Diagnosis not present

## 2017-05-26 DIAGNOSIS — Z01419 Encounter for gynecological examination (general) (routine) without abnormal findings: Secondary | ICD-10-CM | POA: Diagnosis not present

## 2017-06-10 ENCOUNTER — Telehealth: Payer: Self-pay

## 2017-06-10 ENCOUNTER — Encounter: Payer: Self-pay | Admitting: Family Medicine

## 2017-06-10 NOTE — Telephone Encounter (Signed)
PA initiated via Covermymeds; KEY: [id number]. Received real time PA approval.   [id number];KGOVPC:[id number];Review Type:Qty;Coverage Start Date:05/11/2017;Coverage End Date:06/10/2018;

## 2017-06-12 ENCOUNTER — Encounter: Payer: Self-pay | Admitting: Family Medicine

## 2017-06-16 ENCOUNTER — Telehealth: Payer: Self-pay | Admitting: Family Medicine

## 2017-06-16 ENCOUNTER — Encounter: Payer: Self-pay | Admitting: Family Medicine

## 2017-06-16 NOTE — Telephone Encounter (Signed)
Copied from [place] [id number]. Topic: Quick Communication - Rx Refill/Question >> Jun 16, 2017  5:57 PM Tye [place] wrote: Medication: BYSTOLIC 10 MG tablet [[id number]] Has the patient contacted their pharmacy? Yes.   (Agent: If no, request that the patient contact the pharmacy for the refill.) Preferred Pharmacy (with phone number or street name): [facility]  Agent: Please be advised that RX refills may take up to 3 business days. We ask that you follow-up with your pharmacy.

## 2017-06-17 MED ORDER — BYSTOLIC 10 MG PO TABS: 10.0000 mg | ORAL_TABLET | Freq: Every day | ORAL | 5 refills | Status: DC

## 2017-06-17 NOTE — Telephone Encounter (Signed)
Rx sent to pharmacy by provider early this morning

## 2017-06-26 DIAGNOSIS — Z1231 Encounter for screening mammogram for malignant neoplasm of breast: Secondary | ICD-10-CM | POA: Diagnosis not present

## 2017-07-06 DIAGNOSIS — M62838 Other muscle spasm: Secondary | ICD-10-CM | POA: Diagnosis not present

## 2017-07-06 DIAGNOSIS — G4489 Other headache syndrome: Secondary | ICD-10-CM | POA: Diagnosis not present

## 2017-07-06 DIAGNOSIS — B0229 Other postherpetic nervous system involvement: Secondary | ICD-10-CM | POA: Diagnosis not present

## 2017-07-06 DIAGNOSIS — M545 Low back pain: Secondary | ICD-10-CM | POA: Diagnosis not present

## 2017-07-08 ENCOUNTER — Encounter: Payer: Self-pay | Admitting: Family Medicine

## 2017-07-09 ENCOUNTER — Telehealth: Payer: Self-pay | Admitting: Family Medicine

## 2017-07-09 ENCOUNTER — Other Ambulatory Visit: Payer: Medicare Other

## 2017-07-09 NOTE — Telephone Encounter (Signed)
Copied from [place] [id number]. Topic: Quick Communication - Office Called Patient >> Jul 09, 2017  7:10 AM Angela Gibbs wrote: Reason for CRM: Spoke with pt (per Marita [place]) that pt did not have lab orders but that provider would like to see pt for BP and if pt needed any labs done provider would put in lab orders after visit. Pt stated will call later to schedule her appt.

## 2017-07-15 ENCOUNTER — Encounter: Payer: Self-pay | Admitting: Family Medicine

## 2017-07-15 ENCOUNTER — Other Ambulatory Visit: Payer: Self-pay | Admitting: Family Medicine

## 2017-07-28 ENCOUNTER — Other Ambulatory Visit: Payer: Self-pay | Admitting: Family Medicine

## 2017-08-04 NOTE — Progress Notes (Addendum)
[facility] [address], [place], [place] [address] [phone]  Date:  08/05/2017   Name:  Angela Gibbs   DOB:  1940-11-22   MRN:  [id number]  PCP:  Darreld Mclean, MD    Chief Complaint: No chief complaint on file.   History of Present Illness:  Angela Gibbs is a 77 y.o. very pleasant female patient who presents with the following:  Following up on her BP today History of asthma, GERD, HTN I last saw her in January at which time we added amlodipine 2.5 mg to her BP regimen- she has tolerated this well with no apparent SE   Her son and his wife are still splitting up, but they are at least speaking to one another again.  This has been tough on the children, esp the 50 yo - Zaylynn is worried about her and tries to be a good support to her   BP Readings from Last 3 Encounters:  08/05/17 122/80  03/11/17 134/82  03/06/17 138/76   She is on amlodipine, valsartan and bystolic for her BP - well controlled today Lasix as needed for LE edema but she rarely uses this  She is due for routine labs today Last colonoscopy was normal, 10 years ago - she would like to do cologaurd this time if ok No family history of colon cancer known  She is feeling overall well and healthy  She has had a couple of very strong vaccine reactions and is wary of getting any shots that are not absolutely necessary   Patient Active Problem List   Diagnosis Date Noted  . Restrictive lung disease 05/13/2016  . Moderate persistent asthma without complication 09/98/3382  . Current use of beta blocker 04/11/2016  . Other allergic rhinitis 04/11/2016  . Gastroesophageal reflux disease without esophagitis 04/11/2016  . Essential hypertension 02/20/2016  . Abdominal pain, acute, right upper quadrant 07/29/2013    Past Medical History:  Diagnosis Date  . Allergy   . Concussion   . Falls   . Head injury   . Herpes   . Hypertension    . Migraines   . Nerve damage   . Shingles     Past Surgical History:  Procedure Laterality Date  . BRAIN SURGERY    . CRANIOTOMY FOR EXTRACRANIAL-INTRACRANIAL BYPASS    . EYE SURGERY  4.4.17   milia excision  . HIP SURGERY     intracranial surgery  . NERVE SURGERY    . ROOT CANAL    . TONSILLECTOMY      Social History   Tobacco Use  . Smoking status: Never Smoker  . Smokeless tobacco: Never Used  Substance Use Topics  . Alcohol use: Yes    Comment: social  . Drug use: No    Family History  Problem Relation Age of Onset  . Heart disease Father   . Heart attack Father     Allergies  Allergen Reactions  . Cephalosporins Shortness Of Breath    Breathing difficulty  . Ciprofloxacin Hives and Shortness Of Breath  . Keflex [Cephalexin] Hives  . Penicillins Hives  . Sulfacetamide Sodium Hives  . Xopenex [Levalbuterol Hcl] Anxiety and Other (See Comments)    Tremors with violent body chattering and shaking  . Pregabalin Other (See Comments)  . Aspirin Other (See Comments)    Stomach pain/burning  . Chromium Other (See Comments)    Blisters  . Erythromycin Other (  See Comments)    "stomach issues"  . Hydrochlorothiazide Other (See Comments)    Migraine  . Influenza Vaccines Other (See Comments)    Numbness in legs  . Iodinated Diagnostic Agents     unknown  . Iodine   . Lactose Intolerance (Gi) Other (See Comments)    "stomach issues"  . Latex   . Lisinopril Cough  . Nitrofuran Derivatives Hives, Itching and Swelling  . Petrolatum Nausea Only  . Shellfish Allergy Hives  . Sulfa Antibiotics Hives    Other reaction(s): Other (See Comments) Flank pain  . Troleandomycin Other (See Comments)    Unknown  . Zostavax [Zoster Vaccine Live] Other (See Comments)    Pt has been told by specialists not to receive vaccine d/t h/o herpes in bloodstream and shingles.  . Avelox [Moxifloxacin Hcl In Nacl] Nausea Only  . Cefdinir Nausea And Vomiting  . Eggs Or  Egg-Derived Products     Per allergy testing, no known reaction. Pt states she was told she could probably eat 2-3 eggs weekly w/o issue.  . Levaquin [Levofloxacin] Hives, Palpitations and Other (See Comments)    Chest pain  . Percodan [Oxycodone-Aspirin] Nausea And Vomiting  . Prednisone Palpitations    No injections, can do the pills    Medication list has been reviewed and updated.  Current Outpatient Medications on File Prior to Visit  Medication Sig Dispense Refill  . ALPRAZolam (XANAX) 0.25 MG tablet Take 1 tablet (0.25 mg total) by mouth 2 (two) times daily as needed for anxiety. 20 tablet 1  . amLODipine (NORVASC) 5 MG tablet Take 1 tablet (5 mg total) by mouth daily. 30 tablet 6  . Ascorbic Acid (VITAMIN C) 100 MG tablet Take 1,000 mg by mouth daily. Reported on 05/24/2015    . butalbital-acetaminophen-caffeine (FIORICET, ESGIC) 50-325-40 MG per tablet Take by mouth 2 (two) times daily as needed for headache.    Marland Kitchen BYSTOLIC 10 MG tablet Take 1 tablet (10 mg total) by mouth daily. 30 tablet 5  . Cholecalciferol (D 5000) 5000 UNITS TABS daily at 0600.    Marland Kitchen co-enzyme Q-10 30 MG capsule Take 30 mg by mouth daily.    Marland Kitchen erythromycin ophthalmic ointment 1 application.     . fluticasone (FLONASE) 50 MCG/ACT nasal spray Place 2 sprays into both nostrils daily. 16 g 5  . furosemide (LASIX) 20 MG tablet Take 1 tablet (20 mg total) by mouth daily. Use on occasion as needed for swelling 30 tablet 1  . lidocaine (LIDODERM) 5 % Place 1 patch onto the skin.     Marland Kitchen omeprazole (PRILOSEC OTC) 20 MG tablet Take 30 mg by mouth daily.     . Prednicarbate 0.1 % CREA     . promethazine (PHENERGAN) 25 MG tablet Take 1 tablet (25 mg total) by mouth every 6 (six) hours as needed for nausea or vomiting. 30 tablet 0  . sodium chloride (V-R NASAL SPRAY SALINE) 0.65 % nasal spray Place 1 spray into the nose as needed. Use in both nostrils twice daily 30 mL 6  . valsartan (DIOVAN) 80 MG tablet TAKE 2 TABLETS BY  MOUTH DAILY 180 tablet 0   No current facility-administered medications on file prior to visit.     Review of Systems:  As per HPI- otherwise negative. No fever or chills No CP or SOB    Physical Examination: Vitals:   08/05/17 1529  BP: 122/80  Pulse: 60  Resp: 16  Temp: 98.4 F (36.9  C)  SpO2: 98%   Vitals:   08/05/17 1529  Weight: 195 lb 6.4 oz (88.6 kg)  Height: 5' 2.5" (1.588 m)   Body mass index is 35.17 kg/m. Ideal Body Weight: Weight in (lb) to have BMI = 25: 138.6  GEN: WDWN, NAD, Non-toxic, A & O x 3, obese, looks well  HEENT: Atraumatic, Normocephalic. Neck supple. No masses, No LAD.  Bilateral TM wnl, oropharynx normal.  PEERL,EOMI.   Ears and Nose: No external deformity. CV: RRR, No M/G/R. No JVD. No thrill. No extra heart sounds. PULM: CTA B, no wheezes, crackles, rhonchi. No retractions. No resp. distress. No accessory muscle use.  EXTR: No c/c/e NEURO Normal gait.  PSYCH: Normally interactive. Conversant. Not depressed or anxious appearing.  Calm demeanor.    Assessment and Plan: Essential hypertension - Plan: CBC  Screening for colon cancer  Screening for hyperlipidemia - Plan: Lipid panel  Bilateral edema of lower extremity - Plan: Comprehensive metabolic panel  Elevated glucose - Plan: Comprehensive metabolic panel, Hemoglobin A1c  Hyponatremia - Plan: Comprehensive metabolic panel  Following up today- doing well overall Ordered cologuard for her BP is well controlled on current regimen Labs pending as above- Will plan further follow- up pending labs.   Signed Lamar Blinks, MD  Received her labs 6/20, message to pt  Results for orders placed or performed in visit on 08/05/17  CBC  Result Value Ref Range   WBC 6.4 4.0 - 10.5 K/uL   RBC 3.86 (L) 3.87 - 5.11 Mil/uL   Platelets 321.0 150.0 - 400.0 K/uL   Hemoglobin 9.9 (L) 12.0 - 15.0 g/dL   HCT 29.8 (L) 36.0 - 46.0 %   MCV 77.2 (L) 78.0 - 100.0 fl   MCHC 33.3 30.0 - 36.0  g/dL   RDW 14.4 11.5 - 15.5 %  Comprehensive metabolic panel  Result Value Ref Range   Sodium 130 (L) 135 - 145 mEq/L   Potassium 4.8 3.5 - 5.1 mEq/L   Chloride 97 96 - 112 mEq/L   CO2 27 19 - 32 mEq/L   Glucose, Bld 94 70 - 99 mg/dL   BUN 19 6 - 23 mg/dL   Creatinine, Ser 0.83 0.40 - 1.20 mg/dL   Total Bilirubin 0.4 0.2 - 1.2 mg/dL   Alkaline Phosphatase 79 39 - 117 U/L   AST 17 0 - 37 U/L   ALT 17 0 - 35 U/L   Total Protein 7.0 6.0 - 8.3 g/dL   Albumin 4.1 3.5 - 5.2 g/dL   Calcium 9.4 8.4 - 10.5 mg/dL   GFR 70.86 >60.00 mL/min  Hemoglobin A1c  Result Value Ref Range   Hgb A1c MFr Bld 6.1 4.6 - 6.5 %  Lipid panel  Result Value Ref Range   Cholesterol 205 (H) 0 - 200 mg/dL   Triglycerides 72.0 0.0 - 149.0 mg/dL   HDL 77.90 >39.00 mg/dL   VLDL 14.4 0.0 - 40.0 mg/dL   LDL Cholesterol 112 (H) 0 - 99 mg/dL   Total CHOL/HDL Ratio 3    NonHDL 126.73      Your cholesterol is overall good A1c (average blood sugar) is in the PRE-diabetes range. We will keep an eye on this.  Exercise and weight control will help prevent progression to diabetes  Your sodium is a bit low, which is not unusual for you. However, it is a bit lower than your usual range.  I would like to repeat this in about 2 weeks, as a lab  visit only. Be sure you are not drinking to much water, as excessive hydration can cause low sodium Finally, you are anemic this time which is unusual for you.  I wonder if this is a lab error of some kind, and would like to confirm and check an iron level. We can also repeat your CBC and do an iron level when you come in for your sodium recheck.    I will be in touch with your repeat labs- please make a lab appt to see [place] in about 2 weeks. Otherwise we can plan to visit in 6 months

## 2017-08-05 ENCOUNTER — Ambulatory Visit (INDEPENDENT_AMBULATORY_CARE_PROVIDER_SITE_OTHER): Payer: Medicare Other | Admitting: Family Medicine

## 2017-08-05 ENCOUNTER — Encounter: Payer: Self-pay | Admitting: Family Medicine

## 2017-08-05 VITALS — BP 122/80 | HR 60 | Temp 98.4°F | Resp 16 | Ht 62.5 in | Wt 195.4 lb

## 2017-08-05 DIAGNOSIS — D509 Iron deficiency anemia, unspecified: Secondary | ICD-10-CM | POA: Diagnosis not present

## 2017-08-05 DIAGNOSIS — R600 Localized edema: Secondary | ICD-10-CM | POA: Diagnosis not present

## 2017-08-05 DIAGNOSIS — R7309 Other abnormal glucose: Secondary | ICD-10-CM | POA: Diagnosis not present

## 2017-08-05 DIAGNOSIS — I10 Essential (primary) hypertension: Secondary | ICD-10-CM | POA: Diagnosis not present

## 2017-08-05 DIAGNOSIS — Z1211 Encounter for screening for malignant neoplasm of colon: Secondary | ICD-10-CM | POA: Diagnosis not present

## 2017-08-05 DIAGNOSIS — Z13220 Encounter for screening for lipoid disorders: Secondary | ICD-10-CM | POA: Diagnosis not present

## 2017-08-05 DIAGNOSIS — E871 Hypo-osmolality and hyponatremia: Secondary | ICD-10-CM

## 2017-08-05 NOTE — Patient Instructions (Addendum)
We will set up cologuard for you to screen for colon cancer- you should get the box at home  Your BP looks good!  I'll be in touch with your labs asap

## 2017-08-06 ENCOUNTER — Encounter: Payer: Self-pay | Admitting: Family Medicine

## 2017-08-06 LAB — LIPID PANEL
CHOL/HDL RATIO: 3
Cholesterol: 205 mg/dL — ABNORMAL HIGH (ref 0–200)
HDL: 77.9 mg/dL (ref >39.00)
LDL Cholesterol: 112 mg/dL — ABNORMAL HIGH (ref 0–99)
NONHDL: 126.73
TRIGLYCERIDES: 72 mg/dL (ref 0.0–149.0)
VLDL: 14.4 mg/dL (ref 0.0–40.0)

## 2017-08-06 LAB — COMPREHENSIVE METABOLIC PANEL
ALBUMIN: 4.1 g/dL (ref 3.5–5.2)
ALK PHOS: 79 U/L (ref 39–117)
ALT: 17 U/L (ref 0–35)
AST: 17 U/L (ref 0–37)
BILIRUBIN TOTAL: 0.4 mg/dL (ref 0.2–1.2)
BUN: 19 mg/dL (ref 6–23)
CO2: 27 mEq/L (ref 19–32)
Calcium: 9.4 mg/dL (ref 8.4–10.5)
Chloride: 97 mEq/L (ref 96–112)
Creatinine, Ser: 0.83 mg/dL (ref 0.40–1.20)
GFR: 70.86 mL/min (ref >60.00)
Glucose, Bld: 94 mg/dL (ref 70–99)
POTASSIUM: 4.8 meq/L (ref 3.5–5.1)
Sodium: 130 mEq/L — ABNORMAL LOW (ref 135–145)
TOTAL PROTEIN: 7 g/dL (ref 6.0–8.3)

## 2017-08-06 LAB — CBC
HEMATOCRIT: 29.8 % — AB (ref 36.0–46.0)
HEMOGLOBIN: 9.9 g/dL — AB (ref 12.0–15.0)
MCHC: 33.3 g/dL (ref 30.0–36.0)
MCV: 77.2 fl — ABNORMAL LOW (ref 78.0–100.0)
PLATELETS: 321 10*3/uL (ref 150.0–400.0)
RBC: 3.86 Mil/uL — AB (ref 3.87–5.11)
RDW: 14.4 % (ref 11.5–15.5)
WBC: 6.4 10*3/uL (ref 4.0–10.5)

## 2017-08-06 LAB — HEMOGLOBIN A1C: HEMOGLOBIN A1C: 6.1 % (ref 4.6–6.5)

## 2017-08-06 NOTE — Addendum Note (Signed)
Addended by: Lamar Blinks C on: 08/06/2017 03:06 PM   Modules accepted: Orders

## 2017-08-07 ENCOUNTER — Encounter: Payer: Self-pay | Admitting: Pulmonary Disease

## 2017-08-07 ENCOUNTER — Ambulatory Visit (INDEPENDENT_AMBULATORY_CARE_PROVIDER_SITE_OTHER): Payer: Medicare Other | Admitting: Pulmonary Disease

## 2017-08-07 DIAGNOSIS — J984 Other disorders of lung: Secondary | ICD-10-CM

## 2017-08-07 DIAGNOSIS — J4540 Moderate persistent asthma, uncomplicated: Secondary | ICD-10-CM | POA: Diagnosis not present

## 2017-08-07 MED ORDER — ALBUTEROL SULFATE HFA 108 (90 BASE) MCG/ACT IN AERS: 2.0000 | INHALATION_SPRAY | Freq: Four times a day (QID) | RESPIRATORY_TRACT | 6 refills | Status: DC | PRN

## 2017-08-07 NOTE — Progress Notes (Signed)
   Subjective:    Patient ID: Angela Gibbs, female    DOB: 1940-08-22, 77 y.o.   MRN: [id number]  HPI  77 year old never smoker referred by her allergist for evaluation of restrictive lung disease noted on spirometry.  She has scoliosis and moderate hiatal hernia  She reports an allergic reaction to penicillin 2013  and onset of asthma since then. She denies childhood history of asthma.   She presented with shortness of breath in 2018 which improved after she discontinued fentanyl patch.  She had a good summer and then shortness of breath resurfaced around December and has persisted.  She denies wheezing more frequent chest colds In the past bronchodilators have not been of benefit, she was tried on Qvar and Symbicort  Chest x-ray 07/2016 does not show any evidence of interstitial lung disease  Significant tests/ events reviewed  Spirometry 03/2016 showed a ratio of 81 with FEV1 of 55% and FVC of 51% Spirometry 04/2016 and again showed a ratio of 89 with FEV1 of 54% and FVC of 45% again indicating moderate restriction Both these spirometry shows suboptimal efforts of 2-3 seconds  Review of Systems Patient denies significant dyspnea,cough, hemoptysis,  chest pain, palpitations, pedal edema, orthopnea, paroxysmal nocturnal dyspnea, lightheadedness, nausea, vomiting, abdominal or  leg pains      Objective:   Physical Exam  Gen. Pleasant, well-nourished, in no distress ENT - no thrush, no post nasal drip Neck: No JVD, no thyromegaly, no carotid bruits Lungs: no use of accessory muscles, no dullness to percussion, clear without rales or rhonchi  Cardiovascular: Rhythm regular, heart sounds  normal, no murmurs or gallops, no peripheral edema Musculoskeletal: No deformities, no cyanosis or clubbing        Assessment & Plan:

## 2017-08-07 NOTE — Patient Instructions (Signed)
Referral to pulmonary rehab at [place] Albuterol MDI 2 puffs every 6h as needed

## 2017-08-07 NOTE — Assessment & Plan Note (Signed)
There is no evidence of ILD. No evidence that scoliosis her hiatal hernia is getting worse. There is certainly an element of deconditioning due to her chronic hip pain.  We will refer her to pulmonary rehab and see how much she improves. If dyspnea persists then we will undertake further testing including echo for pulmonary hypertension and high-resolution CT chest to rule out interstitial lung disease

## 2017-08-07 NOTE — Assessment & Plan Note (Signed)
Albuterol to use on an as-needed basis in the meantime

## 2017-08-21 ENCOUNTER — Other Ambulatory Visit: Payer: Self-pay | Admitting: Family Medicine

## 2017-08-26 ENCOUNTER — Other Ambulatory Visit: Payer: Self-pay | Admitting: Family Medicine

## 2017-08-26 DIAGNOSIS — F410 Panic disorder [episodic paroxysmal anxiety] without agoraphobia: Secondary | ICD-10-CM

## 2017-08-28 NOTE — Telephone Encounter (Signed)
Requesting:xanax Contract:no UDS:no Last OV:08/05/17 Next OV:not scheduled  Last Refill:09/01/16  #[id number] Database:   Please advise

## 2017-09-02 ENCOUNTER — Encounter: Payer: Self-pay | Admitting: Family Medicine

## 2017-09-04 ENCOUNTER — Telehealth: Payer: Self-pay | Admitting: Family Medicine

## 2017-09-04 DIAGNOSIS — D509 Iron deficiency anemia, unspecified: Secondary | ICD-10-CM

## 2017-09-04 NOTE — Telephone Encounter (Unsigned)
Copied from [place] [id number]. Topic: Quick Communication - See Telephone Encounter >> Sep 04, 2017  2:06 PM Percell Belt A wrote: CRM for notification. See Telephone encounter for: 09/04/17.  Avery from [facility] in [place], they are needing blood work order faxed over to them for pt for, CBC, Feratin and BMP Pt walked in there to get blood work done.  Pt is out of town and would like to get order sent to [facility]  [phone] Fax number [phone]

## 2017-09-04 NOTE — Telephone Encounter (Signed)
Orders for CBC, BMP, and ferritin placed by Dr. Lorelei Pont, and faxed to [facility] to [phone].

## 2017-09-04 NOTE — Telephone Encounter (Signed)
Patient husband is calling and states that the hospital still has not received this faxed and would like them to be refaxed as well as a confirmation when done so. Please advise.

## 2017-09-05 ENCOUNTER — Encounter: Payer: Self-pay | Admitting: Family Medicine

## 2017-09-05 DIAGNOSIS — E871 Hypo-osmolality and hyponatremia: Secondary | ICD-10-CM | POA: Diagnosis not present

## 2017-09-05 DIAGNOSIS — D509 Iron deficiency anemia, unspecified: Secondary | ICD-10-CM | POA: Diagnosis not present

## 2017-09-05 LAB — CBC AND DIFFERENTIAL
HCT: 32 — AB (ref 36–46)
HEMATOCRIT: 32 — AB (ref 36–46)
HEMOGLOBIN: 10.2 — AB (ref 12.0–16.0)
Hemoglobin: 10.2 — AB (ref 12.0–16.0)
PLATELETS: 295 (ref 150–399)
Platelets: 295 (ref 150–399)
WBC: 4.5
WBC: 4.5

## 2017-09-07 NOTE — Telephone Encounter (Signed)
Author phoned laboratory at [facility], and spoke with Butch Penny, who stated that they had received the orders already faxed on 7/19, contrary to husband's report, and pt. already had blood drawn on 7/20. Butch Penny is to fax the results to [phone]. Author phoned husband to notify, left detailed VM as no answer. Will reach out to pt. And husband when labwork has been reviewed by Dr. Lorelei Pont.

## 2017-09-08 ENCOUNTER — Encounter: Payer: Self-pay | Admitting: Family Medicine

## 2017-09-09 ENCOUNTER — Encounter: Payer: Self-pay | Admitting: Family Medicine

## 2017-09-09 NOTE — Telephone Encounter (Signed)
Results received- forwarded to PCP.

## 2017-09-09 NOTE — Telephone Encounter (Signed)
Will be on lookout for lab results.

## 2017-09-09 NOTE — Telephone Encounter (Signed)
Routed to [facility], Dr. Lillie Fragmin CMA, and Vilma Prader, CMA to follow up on faxed lab results.

## 2017-09-10 ENCOUNTER — Encounter: Payer: Self-pay | Admitting: Family Medicine

## 2017-09-14 ENCOUNTER — Encounter: Payer: Self-pay | Admitting: Family Medicine

## 2017-10-05 ENCOUNTER — Other Ambulatory Visit (INDEPENDENT_AMBULATORY_CARE_PROVIDER_SITE_OTHER): Payer: Medicare Other

## 2017-10-05 ENCOUNTER — Other Ambulatory Visit: Payer: Self-pay | Admitting: Emergency Medicine

## 2017-10-05 DIAGNOSIS — D509 Iron deficiency anemia, unspecified: Secondary | ICD-10-CM | POA: Diagnosis not present

## 2017-10-05 LAB — FECAL OCCULT BLOOD, IMMUNOCHEMICAL: FECAL OCCULT BLD: NEGATIVE

## 2017-10-06 ENCOUNTER — Other Ambulatory Visit: Payer: Self-pay | Admitting: Family Medicine

## 2017-10-06 ENCOUNTER — Encounter: Payer: Self-pay | Admitting: Family Medicine

## 2017-10-06 DIAGNOSIS — Z1211 Encounter for screening for malignant neoplasm of colon: Secondary | ICD-10-CM

## 2017-10-26 ENCOUNTER — Other Ambulatory Visit: Payer: Self-pay | Admitting: Family Medicine

## 2017-10-26 DIAGNOSIS — I10 Essential (primary) hypertension: Secondary | ICD-10-CM

## 2017-10-27 DIAGNOSIS — M25551 Pain in right hip: Secondary | ICD-10-CM | POA: Diagnosis not present

## 2017-10-27 DIAGNOSIS — M545 Low back pain: Secondary | ICD-10-CM | POA: Diagnosis not present

## 2017-10-27 DIAGNOSIS — M62838 Other muscle spasm: Secondary | ICD-10-CM | POA: Diagnosis not present

## 2017-10-27 DIAGNOSIS — B0229 Other postherpetic nervous system involvement: Secondary | ICD-10-CM | POA: Diagnosis not present

## 2017-10-30 ENCOUNTER — Other Ambulatory Visit: Payer: Medicare Other

## 2017-10-30 DIAGNOSIS — H0011 Chalazion right upper eyelid: Secondary | ICD-10-CM | POA: Diagnosis not present

## 2017-10-30 DIAGNOSIS — H43813 Vitreous degeneration, bilateral: Secondary | ICD-10-CM | POA: Diagnosis not present

## 2017-10-30 DIAGNOSIS — H2513 Age-related nuclear cataract, bilateral: Secondary | ICD-10-CM | POA: Diagnosis not present

## 2017-10-30 DIAGNOSIS — H33311 Horseshoe tear of retina without detachment, right eye: Secondary | ICD-10-CM | POA: Diagnosis not present

## 2017-10-30 DIAGNOSIS — H029 Unspecified disorder of eyelid: Secondary | ICD-10-CM | POA: Diagnosis not present

## 2017-11-10 ENCOUNTER — Other Ambulatory Visit: Payer: Self-pay | Admitting: Family Medicine

## 2017-11-10 ENCOUNTER — Encounter (INDEPENDENT_AMBULATORY_CARE_PROVIDER_SITE_OTHER): Payer: Self-pay | Admitting: Orthopaedic Surgery

## 2017-11-10 ENCOUNTER — Ambulatory Visit (INDEPENDENT_AMBULATORY_CARE_PROVIDER_SITE_OTHER): Payer: Medicare Other

## 2017-11-10 ENCOUNTER — Ambulatory Visit (INDEPENDENT_AMBULATORY_CARE_PROVIDER_SITE_OTHER): Payer: Medicare Other | Admitting: Orthopaedic Surgery

## 2017-11-10 DIAGNOSIS — Z96641 Presence of right artificial hip joint: Secondary | ICD-10-CM | POA: Diagnosis not present

## 2017-11-10 DIAGNOSIS — M25551 Pain in right hip: Secondary | ICD-10-CM

## 2017-11-10 MED ORDER — DICLOFENAC SODIUM 1 % TD GEL: 2.0000 g | Freq: Four times a day (QID) | TRANSDERMAL | 3 refills | Status: DC

## 2017-11-10 NOTE — Progress Notes (Signed)
Office Visit Note   Patient: Angela Gibbs           Date of Birth: October 10, 1940           MRN: [id number] Visit Date: 11/10/2017              Requested by: Darreld Mclean, MD [address] [place], [place] [address] PCP: Darreld Mclean, MD   Assessment & Plan: Visit Diagnoses:  1. Pain in right hip   2. History of total replacement of right hip     Plan: I will send in some Voltaren gel for her to try because I think this would be reasonable in light of allergies to NSAIDs there is a low systemic absorption of this and she is never tried it before.  Also feel that she would benefit from formal physical therapy just concentrating on the right IT band.  Any modalities such as dry needling may be beneficial to her and she is willing to try this.  We will see her back in about 6 weeks to see what this is done for her.  All question concerns were answered and addressed.  Follow-Up Instructions: Return in about 6 weeks (around 12/22/2017).   Orders:  Orders Placed This Encounter  Procedures  . XR HIP UNILAT W OR W/O PELVIS 2-3 VIEWS RIGHT   Meds ordered this encounter  Medications  . diclofenac sodium (VOLTAREN) 1 % GEL    Sig: Apply 2 g topically 4 (four) times daily.    Dispense:  100 g    Refill:  3      Procedures: No procedures performed   Clinical Data: No additional findings.   Subjective: Chief Complaint  Patient presents with  . Right Hip - Pain  The patient is someone I seen before.  She is a very pleasant 77 year old female with a history of a total hip arthroplasty that was done around 11 years ago in [place].  She also has severe thoracolumbar scoliosis.  She has been on chronic narcotics for long period of time but now off of all those medications mainly due to how was affecting her breathing in combination with her scoliosis.  She comes in today with significant right thigh pain mainly over the IT bands where she points the most.   She occasionally does take hydrocodone at night.  She is not on any anti-inflammatories due to allergies.  We have seen her before and sent her to physical therapy for her lumbar spine.  HPI  Review of Systems She currently denies any headache, chest pain, fever, chills, nausea, vomiting.  Objective: Vital Signs: There were no vitals taken for this visit.  Physical Exam She is alert and oriented x3 and in no acute distress. Ortho Exam On exam she does walk with a slight limp to her gait and a slight Trendelenburg gait.  There is obviously a ligament discrepancy and some of this is related to her hip replacement and some to her severe scoliosis.  Most of her pain is to palpation over the iliotibial band at the mid to distal thigh laterally and slight posterior laterally.  Her hip moves fluidly and is well located. Specialty Comments:  No specialty comments available.  Imaging: Xr Hip Unilat W Or W/o Pelvis 2-3 Views Right  Result Date: 11/10/2017 An AP pelvis and the lateral of the right hip shows a total hip arthroplasty with slight varus angulation at the tip of the stem.  It  is not changed from compared to x-rays from 2015.    PMFS History: Patient Active Problem List   Diagnosis Date Noted  . Restrictive lung disease 05/13/2016  . Moderate persistent asthma without complication 83/10/4074  . Current use of beta blocker 04/11/2016  . Other allergic rhinitis 04/11/2016  . Gastroesophageal reflux disease without esophagitis 04/11/2016  . Essential hypertension 02/20/2016  . Abdominal pain, acute, right upper quadrant 07/29/2013   Past Medical History:  Diagnosis Date  . Allergy   . Concussion   . Falls   . Head injury   . Herpes   . Hypertension   . Migraines   . Nerve damage   . Shingles     Family History  Problem Relation Age of Onset  . Heart disease Father   . Heart attack Father     Past Surgical History:  Procedure Laterality Date  . BRAIN SURGERY    .  CRANIOTOMY FOR EXTRACRANIAL-INTRACRANIAL BYPASS    . EYE SURGERY  4.4.17   milia excision  . HIP SURGERY     intracranial surgery  . NERVE SURGERY    . ROOT CANAL    . TONSILLECTOMY     Social History   Occupational History  . Not on file  Tobacco Use  . Smoking status: Never Smoker  . Smokeless tobacco: Never Used  Substance and Sexual Activity  . Alcohol use: Yes    Comment: social  . Drug use: No  . Sexual activity: Yes

## 2017-11-11 ENCOUNTER — Other Ambulatory Visit: Payer: Self-pay | Admitting: Family Medicine

## 2017-11-11 ENCOUNTER — Other Ambulatory Visit (INDEPENDENT_AMBULATORY_CARE_PROVIDER_SITE_OTHER): Payer: Medicare Other

## 2017-11-11 ENCOUNTER — Ambulatory Visit: Payer: Medicare Other

## 2017-11-11 DIAGNOSIS — D509 Iron deficiency anemia, unspecified: Secondary | ICD-10-CM

## 2017-11-11 DIAGNOSIS — Z1211 Encounter for screening for malignant neoplasm of colon: Secondary | ICD-10-CM

## 2017-11-12 ENCOUNTER — Encounter: Payer: Self-pay | Admitting: Family Medicine

## 2017-11-12 ENCOUNTER — Other Ambulatory Visit: Payer: Self-pay | Admitting: Family Medicine

## 2017-11-12 DIAGNOSIS — Z1211 Encounter for screening for malignant neoplasm of colon: Secondary | ICD-10-CM

## 2017-11-12 LAB — CBC
HCT: 36.6 % (ref 36.0–46.0)
Hemoglobin: 12.3 g/dL (ref 12.0–15.0)
MCHC: 33.5 g/dL (ref 30.0–36.0)
MCV: 85.6 fl (ref 78.0–100.0)
PLATELETS: 256 10*3/uL (ref 150.0–400.0)
RBC: 4.27 Mil/uL (ref 3.87–5.11)
RDW: 20.9 % — AB (ref 11.5–15.5)
WBC: 5.4 10*3/uL (ref 4.0–10.5)

## 2017-11-12 LAB — FERRITIN: Ferritin: 19.4 ng/mL (ref 10.0–291.0)

## 2017-11-16 ENCOUNTER — Encounter: Payer: Self-pay | Admitting: Family Medicine

## 2017-11-17 ENCOUNTER — Telehealth: Payer: Self-pay | Admitting: Pulmonary Disease

## 2017-11-17 DIAGNOSIS — J984 Other disorders of lung: Secondary | ICD-10-CM

## 2017-11-17 NOTE — Telephone Encounter (Signed)
Called patient unable to reach left message to give us a call back.

## 2017-11-18 ENCOUNTER — Encounter: Payer: Self-pay | Admitting: Family Medicine

## 2017-11-18 NOTE — Telephone Encounter (Signed)
Order placed. Nothing further needed at this time. 

## 2017-11-18 NOTE — Telephone Encounter (Signed)
Restrictive lung disease °

## 2017-11-18 NOTE — Addendum Note (Signed)
Addended by: Wynonia Musty A on: 11/18/2017 08:38 AM   Modules accepted: Orders

## 2017-11-18 NOTE — Telephone Encounter (Signed)
Called and spoke to patient, she states RA told her to let his office know when she was back in town so that we could place order for pulmonary rehab. Per RA AVS place order for pulmonary rehab in [place]. Informed patient order will be placed and the would call her to get everything set up. Voiced understanding.  RA please advise on what diagnosis you would like associated for pulmonary rehab, if COPD please specify COPD 2,3, or 4. Thanks.

## 2017-11-18 NOTE — Telephone Encounter (Signed)
Cologuard has been ordered. 

## 2017-11-25 ENCOUNTER — Telehealth: Payer: Self-pay | Admitting: Pulmonary Disease

## 2017-11-25 NOTE — Telephone Encounter (Signed)
Called and spoke to pt, who is requesting update on pulm rehab.  Per our office order was placed on 11/18/17, and a form was given to RA to complete.  Pt stated that [facility] has not reached out to her as of yet.  PCC's can you guys help with this. Thanks

## 2017-11-26 NOTE — Telephone Encounter (Signed)
This order was faxed on 11/20/17.  I called [facility] at [phone] requesting an update.

## 2017-11-27 NOTE — Telephone Encounter (Signed)
Jasmine w/ [facility] returned my call & lett me know they have the patient scheduled to com in on Tuesday, 10/15 @ 11.  Called pt who confirmed she did get the appointment info.  Nothing further needed at this time.

## 2017-11-27 NOTE — Telephone Encounter (Signed)
Holly, please advise if you have an update for [place] from [facility].  Thanks!

## 2017-12-01 ENCOUNTER — Encounter: Payer: Self-pay | Admitting: Family Medicine

## 2017-12-01 DIAGNOSIS — J984 Other disorders of lung: Secondary | ICD-10-CM | POA: Diagnosis not present

## 2017-12-02 DIAGNOSIS — M79604 Pain in right leg: Secondary | ICD-10-CM | POA: Diagnosis not present

## 2017-12-02 DIAGNOSIS — R262 Difficulty in walking, not elsewhere classified: Secondary | ICD-10-CM | POA: Diagnosis not present

## 2017-12-02 DIAGNOSIS — M6281 Muscle weakness (generalized): Secondary | ICD-10-CM | POA: Diagnosis not present

## 2017-12-02 DIAGNOSIS — M7631 Iliotibial band syndrome, right leg: Secondary | ICD-10-CM | POA: Diagnosis not present

## 2017-12-04 DIAGNOSIS — M6281 Muscle weakness (generalized): Secondary | ICD-10-CM | POA: Diagnosis not present

## 2017-12-04 DIAGNOSIS — R262 Difficulty in walking, not elsewhere classified: Secondary | ICD-10-CM | POA: Diagnosis not present

## 2017-12-04 DIAGNOSIS — M7631 Iliotibial band syndrome, right leg: Secondary | ICD-10-CM | POA: Diagnosis not present

## 2017-12-04 DIAGNOSIS — M79604 Pain in right leg: Secondary | ICD-10-CM | POA: Diagnosis not present

## 2017-12-08 ENCOUNTER — Telehealth: Payer: Self-pay

## 2017-12-08 DIAGNOSIS — M79604 Pain in right leg: Secondary | ICD-10-CM | POA: Diagnosis not present

## 2017-12-08 DIAGNOSIS — R262 Difficulty in walking, not elsewhere classified: Secondary | ICD-10-CM | POA: Diagnosis not present

## 2017-12-08 DIAGNOSIS — M7631 Iliotibial band syndrome, right leg: Secondary | ICD-10-CM | POA: Diagnosis not present

## 2017-12-08 DIAGNOSIS — M6281 Muscle weakness (generalized): Secondary | ICD-10-CM | POA: Diagnosis not present

## 2017-12-08 NOTE — Telephone Encounter (Signed)
Copied from [place] [id number]. Topic: General - Other >> Dec 08, 2017 10:25 AM Yvette Rack wrote: Reason for CRM: pt calling stating that she was suppose to have a cologuard test she still haven't received it in the mail she would like for someone to call and follow up on that test

## 2017-12-09 ENCOUNTER — Other Ambulatory Visit: Payer: Self-pay | Admitting: Family Medicine

## 2017-12-09 DIAGNOSIS — I10 Essential (primary) hypertension: Secondary | ICD-10-CM

## 2017-12-09 NOTE — Telephone Encounter (Signed)
Patient called and said she has misplaced  her    amLODipine (NORVASC) 5 MG table   in [place] at friends house she is completley out , mshe has taken her emergency pill for today

## 2017-12-09 NOTE — Telephone Encounter (Signed)
Called cologuard, spoke to support agent. He pulled up patients information in their files. They received my order on 11/19/2017 but did not go through to mail out order on their end. He corrected this and has mailed out kit to patient today. I then called patient to explain to her what happened and that she should expect a kit in the mail in 3-5 business days.

## 2017-12-10 DIAGNOSIS — R262 Difficulty in walking, not elsewhere classified: Secondary | ICD-10-CM | POA: Diagnosis not present

## 2017-12-10 DIAGNOSIS — M79604 Pain in right leg: Secondary | ICD-10-CM | POA: Diagnosis not present

## 2017-12-10 DIAGNOSIS — M7631 Iliotibial band syndrome, right leg: Secondary | ICD-10-CM | POA: Diagnosis not present

## 2017-12-10 DIAGNOSIS — M6281 Muscle weakness (generalized): Secondary | ICD-10-CM | POA: Diagnosis not present

## 2017-12-15 DIAGNOSIS — M79604 Pain in right leg: Secondary | ICD-10-CM | POA: Diagnosis not present

## 2017-12-15 DIAGNOSIS — M7631 Iliotibial band syndrome, right leg: Secondary | ICD-10-CM | POA: Diagnosis not present

## 2017-12-15 DIAGNOSIS — M6281 Muscle weakness (generalized): Secondary | ICD-10-CM | POA: Diagnosis not present

## 2017-12-15 DIAGNOSIS — R262 Difficulty in walking, not elsewhere classified: Secondary | ICD-10-CM | POA: Diagnosis not present

## 2017-12-16 DIAGNOSIS — J984 Other disorders of lung: Secondary | ICD-10-CM | POA: Diagnosis not present

## 2017-12-17 DIAGNOSIS — J984 Other disorders of lung: Secondary | ICD-10-CM | POA: Diagnosis not present

## 2017-12-21 DIAGNOSIS — J984 Other disorders of lung: Secondary | ICD-10-CM | POA: Diagnosis not present

## 2017-12-23 ENCOUNTER — Ambulatory Visit (INDEPENDENT_AMBULATORY_CARE_PROVIDER_SITE_OTHER): Payer: Medicare Other | Admitting: Orthopaedic Surgery

## 2017-12-23 DIAGNOSIS — J984 Other disorders of lung: Secondary | ICD-10-CM | POA: Diagnosis not present

## 2017-12-24 DIAGNOSIS — J984 Other disorders of lung: Secondary | ICD-10-CM | POA: Diagnosis not present

## 2017-12-25 ENCOUNTER — Other Ambulatory Visit: Payer: Self-pay | Admitting: Family Medicine

## 2017-12-28 DIAGNOSIS — R262 Difficulty in walking, not elsewhere classified: Secondary | ICD-10-CM | POA: Diagnosis not present

## 2017-12-28 DIAGNOSIS — M7631 Iliotibial band syndrome, right leg: Secondary | ICD-10-CM | POA: Diagnosis not present

## 2017-12-28 DIAGNOSIS — M79604 Pain in right leg: Secondary | ICD-10-CM | POA: Diagnosis not present

## 2017-12-28 DIAGNOSIS — M6281 Muscle weakness (generalized): Secondary | ICD-10-CM | POA: Diagnosis not present

## 2017-12-29 DIAGNOSIS — D1720 Benign lipomatous neoplasm of skin and subcutaneous tissue of unspecified limb: Secondary | ICD-10-CM | POA: Diagnosis not present

## 2017-12-30 DIAGNOSIS — J984 Other disorders of lung: Secondary | ICD-10-CM | POA: Diagnosis not present

## 2017-12-31 DIAGNOSIS — J984 Other disorders of lung: Secondary | ICD-10-CM | POA: Diagnosis not present

## 2018-01-01 DIAGNOSIS — M7989 Other specified soft tissue disorders: Secondary | ICD-10-CM | POA: Diagnosis not present

## 2018-01-01 DIAGNOSIS — R2241 Localized swelling, mass and lump, right lower limb: Secondary | ICD-10-CM | POA: Diagnosis not present

## 2018-01-04 DIAGNOSIS — J984 Other disorders of lung: Secondary | ICD-10-CM | POA: Diagnosis not present

## 2018-01-05 ENCOUNTER — Encounter: Payer: Self-pay | Admitting: Family Medicine

## 2018-01-05 ENCOUNTER — Telehealth: Payer: Self-pay

## 2018-01-05 DIAGNOSIS — M79604 Pain in right leg: Secondary | ICD-10-CM | POA: Diagnosis not present

## 2018-01-05 DIAGNOSIS — R262 Difficulty in walking, not elsewhere classified: Secondary | ICD-10-CM | POA: Diagnosis not present

## 2018-01-05 DIAGNOSIS — M6281 Muscle weakness (generalized): Secondary | ICD-10-CM | POA: Diagnosis not present

## 2018-01-05 DIAGNOSIS — M7631 Iliotibial band syndrome, right leg: Secondary | ICD-10-CM | POA: Diagnosis not present

## 2018-01-05 NOTE — Telephone Encounter (Signed)
Copied from [place] [id number]. Topic: General - Other >> Jan 05, 2018  9:20 AM Keene Breath wrote: Reason for CRM: Pharmacy called to inform the doctor that they faxed over a request for Wards nasal spray for patient last week and 01/04/18.  Called to get the status of the prescription.  Please advise and call back at [phone].

## 2018-01-05 NOTE — Telephone Encounter (Signed)
I have not gotten this request either  The phone number provided does not work Will message pt and ask her about this [place]

## 2018-01-05 NOTE — Telephone Encounter (Signed)
Have you gotten fax? I have no paperwork on my desk for wards nasal spray.

## 2018-01-05 NOTE — Telephone Encounter (Addendum)
The patient called to verify the c b # [phone] and  fax  [phone] for [facility]

## 2018-01-06 DIAGNOSIS — Z1211 Encounter for screening for malignant neoplasm of colon: Secondary | ICD-10-CM | POA: Diagnosis not present

## 2018-01-06 DIAGNOSIS — J984 Other disorders of lung: Secondary | ICD-10-CM | POA: Diagnosis not present

## 2018-01-06 DIAGNOSIS — Z1212 Encounter for screening for malignant neoplasm of rectum: Secondary | ICD-10-CM | POA: Diagnosis not present

## 2018-01-06 LAB — COLOGUARD

## 2018-01-07 DIAGNOSIS — J984 Other disorders of lung: Secondary | ICD-10-CM | POA: Diagnosis not present

## 2018-01-11 DIAGNOSIS — J984 Other disorders of lung: Secondary | ICD-10-CM | POA: Diagnosis not present

## 2018-01-12 DIAGNOSIS — M6281 Muscle weakness (generalized): Secondary | ICD-10-CM | POA: Diagnosis not present

## 2018-01-12 DIAGNOSIS — R262 Difficulty in walking, not elsewhere classified: Secondary | ICD-10-CM | POA: Diagnosis not present

## 2018-01-12 DIAGNOSIS — M7631 Iliotibial band syndrome, right leg: Secondary | ICD-10-CM | POA: Diagnosis not present

## 2018-01-12 DIAGNOSIS — M79604 Pain in right leg: Secondary | ICD-10-CM | POA: Diagnosis not present

## 2018-01-18 DIAGNOSIS — J984 Other disorders of lung: Secondary | ICD-10-CM | POA: Diagnosis not present

## 2018-01-19 ENCOUNTER — Encounter: Payer: Self-pay | Admitting: Family Medicine

## 2018-01-19 DIAGNOSIS — R262 Difficulty in walking, not elsewhere classified: Secondary | ICD-10-CM | POA: Diagnosis not present

## 2018-01-19 DIAGNOSIS — M6281 Muscle weakness (generalized): Secondary | ICD-10-CM | POA: Diagnosis not present

## 2018-01-19 DIAGNOSIS — M79604 Pain in right leg: Secondary | ICD-10-CM | POA: Diagnosis not present

## 2018-01-19 DIAGNOSIS — M7631 Iliotibial band syndrome, right leg: Secondary | ICD-10-CM | POA: Diagnosis not present

## 2018-01-20 ENCOUNTER — Telehealth: Payer: Self-pay | Admitting: *Deleted

## 2018-01-20 DIAGNOSIS — J984 Other disorders of lung: Secondary | ICD-10-CM | POA: Diagnosis not present

## 2018-01-20 NOTE — Telephone Encounter (Signed)
Received Cologuard Results; forwarded to provider/SLS 12/04

## 2018-01-21 ENCOUNTER — Telehealth: Payer: Self-pay | Admitting: Family Medicine

## 2018-01-21 DIAGNOSIS — J984 Other disorders of lung: Secondary | ICD-10-CM | POA: Diagnosis not present

## 2018-01-21 NOTE — Telephone Encounter (Signed)
Copied from [place] [id number]. Topic: Quick Communication - See Telephone Encounter >> Jan 21, 2018 11:35 AM Reyne Dumas L wrote: CRM for notification. See Telephone encounter for: 01/21/18.  Pt states she had a flu shot about 10 years ago with reaction.  Pt would like to have the flu shot this year and wants to know if she can get half a shot one day and half a shot three days later. Pt can be reached at [phone].

## 2018-01-22 NOTE — Telephone Encounter (Signed)
Talked to patient yesterday she reports that she had a severe muscle weakness reaction for over a weeks after last flu shot 10 years ago. Per Dr. Lorelei Pont patient was advised she is better off not trying it again and we can not split this injection either way. Patient voiced understanding.

## 2018-01-25 DIAGNOSIS — J984 Other disorders of lung: Secondary | ICD-10-CM | POA: Diagnosis not present

## 2018-01-27 DIAGNOSIS — J984 Other disorders of lung: Secondary | ICD-10-CM | POA: Diagnosis not present

## 2018-01-28 DIAGNOSIS — B0229 Other postherpetic nervous system involvement: Secondary | ICD-10-CM | POA: Diagnosis not present

## 2018-01-28 DIAGNOSIS — G4489 Other headache syndrome: Secondary | ICD-10-CM | POA: Diagnosis not present

## 2018-01-28 DIAGNOSIS — M62838 Other muscle spasm: Secondary | ICD-10-CM | POA: Diagnosis not present

## 2018-01-28 DIAGNOSIS — M25551 Pain in right hip: Secondary | ICD-10-CM | POA: Diagnosis not present

## 2018-02-01 DIAGNOSIS — J984 Other disorders of lung: Secondary | ICD-10-CM | POA: Diagnosis not present

## 2018-02-03 DIAGNOSIS — J984 Other disorders of lung: Secondary | ICD-10-CM | POA: Diagnosis not present

## 2018-02-15 DIAGNOSIS — J984 Other disorders of lung: Secondary | ICD-10-CM | POA: Diagnosis not present

## 2018-02-18 DIAGNOSIS — J984 Other disorders of lung: Secondary | ICD-10-CM | POA: Diagnosis not present

## 2018-02-22 DIAGNOSIS — J984 Other disorders of lung: Secondary | ICD-10-CM | POA: Diagnosis not present

## 2018-02-24 DIAGNOSIS — J984 Other disorders of lung: Secondary | ICD-10-CM | POA: Diagnosis not present

## 2018-02-25 DIAGNOSIS — J984 Other disorders of lung: Secondary | ICD-10-CM | POA: Diagnosis not present

## 2018-02-26 ENCOUNTER — Telehealth: Payer: Self-pay | Admitting: Pulmonary Disease

## 2018-02-26 DIAGNOSIS — J984 Other disorders of lung: Secondary | ICD-10-CM

## 2018-02-26 NOTE — Telephone Encounter (Signed)
Order has been placed for pt to do maintenance program with pulm rehab. Called and spoke with pt letting her know this had been done. Pt expressed understanding. Nothing further needed.

## 2018-02-26 NOTE — Telephone Encounter (Signed)
Okay with me if she wants to enroll in maintenance program

## 2018-02-26 NOTE — Telephone Encounter (Signed)
Called and spoke with patient, she stated that she will be done with her pulmonary rehab program at the end of this month. The patient stated that this has helped her tremendously and she spoke with you at her last OV about maybe having a second round of the program. Patient is stating if so she would like to do another 12 weeks. RA please advise, thank you.

## 2018-03-01 DIAGNOSIS — J984 Other disorders of lung: Secondary | ICD-10-CM | POA: Diagnosis not present

## 2018-03-04 DIAGNOSIS — J984 Other disorders of lung: Secondary | ICD-10-CM | POA: Diagnosis not present

## 2018-03-08 ENCOUNTER — Other Ambulatory Visit: Payer: Self-pay | Admitting: *Deleted

## 2018-03-08 ENCOUNTER — Telehealth: Payer: Self-pay | Admitting: Pulmonary Disease

## 2018-03-08 DIAGNOSIS — J984 Other disorders of lung: Secondary | ICD-10-CM | POA: Diagnosis not present

## 2018-03-08 NOTE — Progress Notes (Signed)
Received the below referral note:  Type Date User Summary Attachment  General 03/01/2018 5:25 PM Rowe Pavy, RN Auto: Referral message -  Note   ----- Message ----- From: Rowe Pavy, RN Sent: 03/01/2018   5:24 PM EST To: Waldemar Dickens Jahne Krukowski, CMA  See my note below.  Pt lives in [facility] and just completed pulmonary rehab at [facility].  Please contact them for their referral process.  Unable to assist outside of the [facility] system. Maurice Small RN, BSN Cardiac and Pulmonary Rehab Nurse Navigator          Type Date User Summary Attachment  General 03/01/2018 5:25 PM Rowe Pavy, RN - -  Note   Noted that pt completed Pulmonary rehab at [facility].  Pt lives in [place].  Will alert Dr. [name] office to contact [facility] for their referral process. Will close this referral.  Maurice Small RN, BSN Cardiac and Pulmonary Rehab Nurse Navigator          Will place new order for the pulm main program and place it with pulm rehab at [facility].

## 2018-03-08 NOTE — Telephone Encounter (Signed)
Spoke with Melissa at [place] [facility]. She is wanting an order for the pt to have more visits with pulmonary rehab. Melissa states that the pt is doing well with the program.  Dr. [name] - please advise. Thanks.

## 2018-03-09 NOTE — Telephone Encounter (Signed)
Called and spoke with Angela Gibbs, while advising her of response below. She stated that the message was received wrong. Patient wanted rehab to call us and have [place] send over another order so that she can continue this. Advised Angela Gibbs of phone note below from 1.10.2020 and Angela Gibbs stated that is sufficient enough for the patient to enroll in the fitness center to continue her exercises. Patient is aware. Nothing further needed. Will copy 1.10.2020 message below.    Conversation: Advice Only  (Newest Message First)  Angela Gibbs, Angela Gibbs, Angela Gibbs       02/26/18 4:41 PM  Note    Order has been placed for pt to do maintenance program with pulm rehab. Called and spoke with pt letting her know this had been done. Pt expressed understanding. Nothing further needed.    Rigoberto Noel, MD  to [facility] Triage Pool      02/26/18 4:37 PM  Note    Okay with me if she wants to enroll in maintenance program          02/26/18 2:58 PM  You routed this conversation to Rigoberto Noel, MD  Me       02/26/18 2:58 PM  Note    Called and spoke with patient, she stated that she will be done with her pulmonary rehab program at the end of this month. The patient stated that this has helped her tremendously and she spoke with you at her last OV about maybe having a second round of the program. Patient is stating if so she would like to do another 12 weeks. RA please advise, thank you.

## 2018-03-09 NOTE — Telephone Encounter (Signed)
OK to give order.

## 2018-03-10 DIAGNOSIS — J984 Other disorders of lung: Secondary | ICD-10-CM | POA: Diagnosis not present

## 2018-03-11 DIAGNOSIS — J984 Other disorders of lung: Secondary | ICD-10-CM | POA: Diagnosis not present

## 2018-03-15 ENCOUNTER — Telehealth: Payer: Self-pay | Admitting: Pulmonary Disease

## 2018-03-15 DIAGNOSIS — J984 Other disorders of lung: Secondary | ICD-10-CM | POA: Diagnosis not present

## 2018-03-15 NOTE — Telephone Encounter (Signed)
Left message for patient to call back  

## 2018-03-16 NOTE — Telephone Encounter (Signed)
LMTCB x1 for pt.  

## 2018-03-16 NOTE — Telephone Encounter (Signed)
Spoke with pt. She states she is wanting to repeat the pulmonary rehab program at [facility]. An order was placed and the forms were faxed to [facility]. Pt states that [facility] does not have a maintenance pulmonary rehab program. Reports that [facility] told her that she would go to the hospital gym to work out but she would be unattended while doing so. She is not comfortable doing this and wants to be monitored while working out.  Dr. [name] - please advise. Thanks.

## 2018-03-16 NOTE — Telephone Encounter (Signed)
Pt is calling back [phone]

## 2018-03-17 NOTE — Telephone Encounter (Signed)
LMTCB

## 2018-03-17 NOTE — Telephone Encounter (Signed)
That is no monitoring for maintenance pulmonary rehab . she will have to go with recommendations at [facility] or she can go outside to the gym of her choice

## 2018-03-18 DIAGNOSIS — J984 Other disorders of lung: Secondary | ICD-10-CM | POA: Diagnosis not present

## 2018-03-18 NOTE — Telephone Encounter (Signed)
ATC pt, no answer. Left message for pt to call back.  

## 2018-03-18 NOTE — Telephone Encounter (Signed)
Pt wants to go back to the monitored pulmonary rehab that she was doing previously at [facility].  Dr. [name] - please advise. Thanks.

## 2018-03-18 NOTE — Telephone Encounter (Signed)
They can tell her is she has any visits left I will write rx if she qualifies

## 2018-03-19 NOTE — Telephone Encounter (Signed)
LMTCB x2 for pt 

## 2018-03-19 NOTE — Telephone Encounter (Signed)
Spoke with patient She said she talked with [facility] who told her that once she has completed program, she would use their gym on her own. Which she is not comfortable with, wants to be in a supervised environment. Patient said that [facility] told her they do not provide those services and would expect to see her back there again in the program in a few months. That even if she paid out of pocket, she would be using the same gym. Recommended to the patient to try the gym location they provide and ask for assistance when she is there if not comfortable using the equipment. Also suggested asking for any printed information for exercise she can do at home to help keep her from having a set back. Patient said she will give it a try. Patient voiced understanding. Nothing further needed at this time.

## 2018-03-19 NOTE — Telephone Encounter (Signed)
Pt is calling back [phone]

## 2018-04-13 ENCOUNTER — Other Ambulatory Visit: Payer: Self-pay | Admitting: Family Medicine

## 2018-04-15 ENCOUNTER — Telehealth: Payer: Self-pay | Admitting: Family Medicine

## 2018-04-15 NOTE — Telephone Encounter (Signed)
Patient is requesting Rx refill for [name] nasal spray                                                     Angela Gibbs                                                     [address]                                                     [name]                                       [phone]   (fax- [phone])

## 2018-04-15 NOTE — Telephone Encounter (Signed)
Copied from [place] [id number]. Topic: Quick Communication - Rx Refill/Question >> Apr 15, 2018 10:50 AM Virl Axe D wrote: Medication: Dr. Tera Helper? Nasal Spray compound / Pharmacy stated they have faxed over a few refill requests with no result. Please advise.  Has the patient contacted their pharmacy? Yes.   (Agent: If no, request that the patient contact the pharmacy for the refill.) (Agent: If yes, when and what did the pharmacy advise?)  Preferred Pharmacy (with phone number or street name): [facility] - [place], [place] - [address] [phone] (Phone) [phone] (Fax)  Agent: Please be advised that RX refills may take up to 3 business days. We ask that you follow-up with your pharmacy.

## 2018-04-16 ENCOUNTER — Encounter: Payer: Self-pay | Admitting: Family Medicine

## 2018-04-16 NOTE — Telephone Encounter (Signed)
Called the pharm and LMOM - ok to refill per last sig.  I do not know the exact components of this product

## 2018-04-21 ENCOUNTER — Encounter: Payer: Self-pay | Admitting: Family Medicine

## 2018-04-22 DIAGNOSIS — H33311 Horseshoe tear of retina without detachment, right eye: Secondary | ICD-10-CM | POA: Diagnosis not present

## 2018-04-22 DIAGNOSIS — Z9889 Other specified postprocedural states: Secondary | ICD-10-CM | POA: Diagnosis not present

## 2018-04-22 DIAGNOSIS — H43813 Vitreous degeneration, bilateral: Secondary | ICD-10-CM | POA: Diagnosis not present

## 2018-04-22 DIAGNOSIS — H2513 Age-related nuclear cataract, bilateral: Secondary | ICD-10-CM | POA: Diagnosis not present

## 2018-04-22 DIAGNOSIS — Z79899 Other long term (current) drug therapy: Secondary | ICD-10-CM | POA: Diagnosis not present

## 2018-04-22 DIAGNOSIS — M25551 Pain in right hip: Secondary | ICD-10-CM | POA: Diagnosis not present

## 2018-04-22 DIAGNOSIS — Z5181 Encounter for therapeutic drug level monitoring: Secondary | ICD-10-CM | POA: Diagnosis not present

## 2018-04-22 DIAGNOSIS — B0229 Other postherpetic nervous system involvement: Secondary | ICD-10-CM | POA: Diagnosis not present

## 2018-04-22 DIAGNOSIS — G4489 Other headache syndrome: Secondary | ICD-10-CM | POA: Diagnosis not present

## 2018-04-22 DIAGNOSIS — M62838 Other muscle spasm: Secondary | ICD-10-CM | POA: Diagnosis not present

## 2018-04-22 DIAGNOSIS — H11441 Conjunctival cysts, right eye: Secondary | ICD-10-CM | POA: Diagnosis not present

## 2018-04-22 DIAGNOSIS — H11823 Conjunctivochalasis, bilateral: Secondary | ICD-10-CM | POA: Diagnosis not present

## 2018-05-01 ENCOUNTER — Encounter: Payer: Self-pay | Admitting: Family Medicine

## 2018-05-03 MED ORDER — ATENOLOL 25 MG PO TABS: 25.0000 mg | ORAL_TABLET | Freq: Every day | ORAL | 3 refills | Status: DC

## 2018-05-27 ENCOUNTER — Encounter: Payer: Self-pay | Admitting: Family Medicine

## 2018-05-28 ENCOUNTER — Encounter: Payer: Self-pay | Admitting: Family Medicine

## 2018-05-28 ENCOUNTER — Other Ambulatory Visit: Payer: Self-pay | Admitting: Family Medicine

## 2018-05-28 DIAGNOSIS — F410 Panic disorder [episodic paroxysmal anxiety] without agoraphobia: Secondary | ICD-10-CM

## 2018-05-28 MED ORDER — ALPRAZOLAM 0.25 MG PO TABS: 0.2500 mg | ORAL_TABLET | Freq: Two times a day (BID) | ORAL | 0 refills | Status: DC | PRN

## 2018-05-31 NOTE — Telephone Encounter (Signed)
Will schedule for webex appointment.

## 2018-05-31 NOTE — Telephone Encounter (Signed)
Called patient, she states she will callback in morning to schedule virtual visit when husband is around to help her.

## 2018-06-03 ENCOUNTER — Encounter: Payer: Self-pay | Admitting: Family Medicine

## 2018-06-03 ENCOUNTER — Ambulatory Visit (INDEPENDENT_AMBULATORY_CARE_PROVIDER_SITE_OTHER): Payer: Medicare Other | Admitting: Family Medicine

## 2018-06-03 ENCOUNTER — Other Ambulatory Visit: Payer: Self-pay

## 2018-06-03 DIAGNOSIS — I10 Essential (primary) hypertension: Secondary | ICD-10-CM

## 2018-06-03 MED ORDER — NEBIVOLOL HCL 10 MG PO TABS: 10.0000 mg | ORAL_TABLET | Freq: Every day | ORAL | 0 refills | Status: DC

## 2018-06-03 NOTE — Progress Notes (Signed)
[facility] [address] [place], [place] [address] [phone]  Date:  06/03/2018   Name:  Angela Gibbs   DOB:  [birth year]   MRN:  [id number]  PCP:  Darreld Mclean, MD    Chief Complaint: No chief complaint on file.   History of Present Illness:  Angela Gibbs is a 78 y.o. very pleasant female patient who presents with the following:  Virtual visit today due to covid 84 outbreak- converted to phone visit due to tech difficulty  Pt location is home Provider location is office Pt ID confirmed with name and date of birth.  She gives consent for virtual visit today  Her husband Angela Gibbs is also present on the call today for review.  Overall patient is tolerating staying home order well, her spirits are fine She is able to speak with her neighbor's from across the yard   Last seen by myself in June-at that time her blood pressure was well controlled with amlodipine, valsartan, Bystolic  She is not taking atenolol - but she did use it in the past.  She had asked for me to write for this medication, as she was having some trouble getting Bystolic covered by insurance.  However she was able to get a Bystolic coupon so is not actually taking atenolol She also has Lasix for lower extremity edema, but rarely uses it  Most recent labs from myself in June She has been seeing ophthalmology recently-she did have a retinal tear in 2015 which was repaired with laser.   She also sees pulmonology, Dr.Ejaz-last visit with him in January  She takes her valsartan 80 mg, 2 doses about one hour apart- she finds that if she takes  She finds that if she takes 160 mg all at once her legs "go numb" She asked me to write a letter to her insurance company explaining the reason for this dosing pattern  She notes that she hears "a rushing of blood in my ears" after she takes her amlodipine 5 mg- this occurs about 30 minutes after she takes  her pill on a consistent basis.  This is really bothering her, she wonders if she could try stopping amlodipine and see her blood pressure response  Patient Active Problem List   Diagnosis Date Noted  . Restrictive lung disease 05/13/2016  . Moderate persistent asthma without complication 75/44/9201  . Current use of beta blocker 04/11/2016  . Other allergic rhinitis 04/11/2016  . Gastroesophageal reflux disease without esophagitis 04/11/2016  . Essential hypertension 02/20/2016  . Abdominal pain, acute, right upper quadrant 07/29/2013    Past Medical History:  Diagnosis Date  . Allergy   . Concussion   . Falls   . Head injury   . Herpes   . Hypertension   . Migraines   . Nerve damage   . Shingles     Past Surgical History:  Procedure Laterality Date  . BRAIN SURGERY    . CRANIOTOMY FOR EXTRACRANIAL-INTRACRANIAL BYPASS    . EYE SURGERY  4.4.17   milia excision  . HIP SURGERY     intracranial surgery  . NERVE SURGERY    . ROOT CANAL    . TONSILLECTOMY      Social History   Tobacco Use  . Smoking status: Never Smoker  . Smokeless tobacco: Never Used  Substance Use Topics  . Alcohol use: Yes    Comment: social  . Drug use:  No    Family History  Problem Relation Age of Onset  . Heart disease Father   . Heart attack Father     Allergies  Allergen Reactions  . Cephalosporins Shortness Of Breath    Breathing difficulty  . Ciprofloxacin Hives and Shortness Of Breath  . Keflex [Cephalexin] Hives  . Penicillins Hives  . Sulfacetamide Sodium Hives  . Xopenex [Levalbuterol Hcl] Anxiety and Other (See Comments)    Tremors with violent body chattering and shaking  . Pregabalin Other (See Comments)  . Aspirin Other (See Comments)    Stomach pain/burning  . Chromium Other (See Comments)    Blisters  . Erythromycin Other (See Comments)    "stomach issues"  . Hydrochlorothiazide Other (See Comments)    Migraine  . Influenza Vaccines Other (See Comments)     Numbness in legs  . Iodinated Diagnostic Agents     unknown  . Iodine   . Lactose Intolerance (Gi) Other (See Comments)    "stomach issues"  . Latex   . Lisinopril Cough  . Nitrofuran Derivatives Hives, Itching and Swelling  . Petrolatum Nausea Only  . Shellfish Allergy Hives  . Sulfa Antibiotics Hives    Other reaction(s): Other (See Comments) Flank pain  . Troleandomycin Other (See Comments)    Unknown  . Zostavax [Zoster Vaccine Live] Other (See Comments)    Pt has been told by specialists not to receive vaccine d/t h/o herpes in bloodstream and shingles.  . Avelox [Moxifloxacin Hcl In Nacl] Nausea Only  . Cefdinir Nausea And Vomiting  . Eggs Or Egg-Derived Products     Per allergy testing, no known reaction. Pt states she was told she could probably eat 2-3 eggs weekly w/o issue.  . Levaquin [Levofloxacin] Hives, Palpitations and Other (See Comments)    Chest pain  . Percodan [Oxycodone-Aspirin] Nausea And Vomiting  . Prednisone Palpitations    No injections, can do the pills    Medication list has been reviewed and updated.  Current Outpatient Medications on File Prior to Visit  Medication Sig Dispense Refill  . albuterol (PROVENTIL HFA;VENTOLIN HFA) 108 (90 Base) MCG/ACT inhaler Inhale 2 puffs into the lungs every 6 (six) hours as needed for wheezing or shortness of breath. 1 Inhaler 6  . ALPRAZolam (XANAX) 0.25 MG tablet Take 1 tablet (0.25 mg total) by mouth 2 (two) times daily as needed. for anxiety.  Do not combine with any other sedating medication 20 tablet 0  . amLODipine (NORVASC) 5 MG tablet Take 1 tablet (5 mg total) by mouth daily. 90 tablet 0  . Ascorbic Acid (VITAMIN C) 100 MG tablet Take 1,000 mg by mouth daily. Reported on 05/24/2015    . atenolol (TENORMIN) 25 MG tablet Take 1 tablet (25 mg total) by mouth daily. 90 tablet 3  . butalbital-acetaminophen-caffeine (FIORICET, ESGIC) 50-325-40 MG per tablet Take by mouth 2 (two) times daily as needed for headache.     . Cholecalciferol (D 5000) 5000 UNITS TABS daily at 0600.    Marland Kitchen co-enzyme Q-10 30 MG capsule Take 30 mg by mouth daily.    . diclofenac sodium (VOLTAREN) 1 % GEL Apply 2 g topically 4 (four) times daily. 100 g 3  . erythromycin ophthalmic ointment 1 application.     . fluticasone (FLONASE) 50 MCG/ACT nasal spray Place 2 sprays into both nostrils daily. 16 g 5  . furosemide (LASIX) 20 MG tablet Take 1 tablet (20 mg total) by mouth daily. Use on occasion as  needed for swelling 30 tablet 1  . lidocaine (LIDODERM) 5 % Place 1 patch onto the skin.     Marland Kitchen omeprazole (PRILOSEC OTC) 20 MG tablet Take 30 mg by mouth daily.     . Prednicarbate 0.1 % CREA     . promethazine (PHENERGAN) 25 MG tablet Take 1 tablet (25 mg total) by mouth every 6 (six) hours as needed for nausea or vomiting. 30 tablet 0  . sodium chloride (V-R NASAL SPRAY SALINE) 0.65 % nasal spray Place 1 spray into the nose as needed. Use in both nostrils twice daily 30 mL 6  . valsartan (DIOVAN) 80 MG tablet TAKE 2 TABLETS(160 MG) BY MOUTH DAILY 180 tablet 1   No current facility-administered medications on file prior to visit.     Review of Systems:  As per HPI- otherwise negative.   Physical Examination: There were no vitals filed for this visit. There were no vitals filed for this visit. There is no height or weight on file to calculate BMI. Ideal Body Weight:   Pt BP an hour ago was 152/93, pulse 60.  She did some relaxation technique and came down to 145/87 However she notes that her blood pressure is usually better, she was just feeling tense today  Spoke with patient on phone.  She sounds well, no cough, wheezing, or distress is noted  Assessment and Plan: Essential hypertension - Plan: nebivolol (BYSTOLIC) 10 MG tablet  Follow-up visit to discuss hypertension today Patient's blood pressure regimen is a bit confusing, but we finally determined that she is taking amlodipine 5, Bystolic 10 mg, and valsartan 160 mg in 2  divided doses.  She takes each medication a particular time of day, which she feels helps to control her blood pressure for the full 24 hours She notes a possible side effect of amlodipine, which seems to cause a sensation of blood rushing to her ears.  I am not certain what this signifies, but she can try stopping her amlodipine and see how her blood pressure responds.  She will let me know how this works for her We hesitate to increase her beta-blocker dose due to borderline bradycardia Spoke to pt for 18 minutes today  Signed Lamar Blinks, MD  I will also write a brief letter to her insurance company, as requested explaining why she takes valsartan in 2 divided doses

## 2018-06-11 ENCOUNTER — Telehealth: Payer: Self-pay | Admitting: *Deleted

## 2018-06-11 NOTE — Telephone Encounter (Signed)
Received Pharmacy Orders from [facility] stating that Prior Authorization has not been processed; forwarded to Kaylyn/[facility] 04/24

## 2018-06-22 ENCOUNTER — Encounter: Payer: Self-pay | Admitting: Family Medicine

## 2018-07-14 ENCOUNTER — Encounter: Payer: Self-pay | Admitting: Family Medicine

## 2018-07-19 ENCOUNTER — Other Ambulatory Visit: Payer: Self-pay | Admitting: Family Medicine

## 2018-07-19 ENCOUNTER — Encounter: Payer: Self-pay | Admitting: Family Medicine

## 2018-07-19 ENCOUNTER — Ambulatory Visit (INDEPENDENT_AMBULATORY_CARE_PROVIDER_SITE_OTHER): Payer: Medicare Other | Admitting: Family Medicine

## 2018-07-19 ENCOUNTER — Other Ambulatory Visit: Payer: Self-pay

## 2018-07-19 VITALS — BP 136/70 | HR 60 | Temp 97.8°F | Resp 18 | Ht 62.5 in | Wt 191.0 lb

## 2018-07-19 DIAGNOSIS — F410 Panic disorder [episodic paroxysmal anxiety] without agoraphobia: Secondary | ICD-10-CM | POA: Diagnosis not present

## 2018-07-19 DIAGNOSIS — R600 Localized edema: Secondary | ICD-10-CM

## 2018-07-19 DIAGNOSIS — I10 Essential (primary) hypertension: Secondary | ICD-10-CM | POA: Diagnosis not present

## 2018-07-19 MED ORDER — ALPRAZOLAM 0.25 MG PO TABS: 0.2500 mg | ORAL_TABLET | Freq: Two times a day (BID) | ORAL | 1 refills | Status: DC | PRN

## 2018-07-19 MED ORDER — NEBIVOLOL HCL 10 MG PO TABS: 10.0000 mg | ORAL_TABLET | Freq: Every day | ORAL | 3 refills | Status: DC

## 2018-07-19 MED ORDER — FUROSEMIDE 20 MG PO TABS: 20.0000 mg | ORAL_TABLET | Freq: Every day | ORAL | 1 refills | Status: DC

## 2018-07-19 MED ORDER — VALSARTAN 80 MG PO TABS: 80.0000 mg | ORAL_TABLET | Freq: Two times a day (BID) | ORAL | 3 refills | Status: DC

## 2018-07-19 NOTE — Patient Instructions (Signed)
It was great to see you again today- I will be in touch with your labs asap I refilled your lasix today- please don't start on it until we get your sodium level back just in case Assuming your sodium is ok I think you can use the lasix on an as needed basis  Continue to use your compression socks and try to elevate your legs as you can

## 2018-07-19 NOTE — Progress Notes (Addendum)
[facility] [address] [place], [place], [place] [address] [phone]  Date:  07/19/2018   Name:  Angela Gibbs   DOB:  1941-01-05   MRN:  [id number]  PCP:  Darreld Mclean, MD    Chief Complaint: Hypertension (med check, follow up) and Edema (feet and legs, bilateral)   History of Present Illness:  Angela Gibbs is a 78 y.o. very pleasant female patient who presents with the following:  Here today in person for a recheck visit History of HTN, restrictive lung disease/ moderate persistent asthma  She had recently sent the following message:   Dr. Edilia Bo, I will need a new prescription for bystolic and valsartan. I have just had both of them refilled, however, there will be a need in 30 days. As to summerize , I'm taking valsartan , 80 mg twice a day at 6:76 pm and bystolic 10 mg at 72:09 - 2:00 am. As of recently I am having more and more difficulty with swollen feet and legs, even though I'm wearing support socks. My feet and leg from the ankle to my knee are also swelling so If I could have a new prescription for my Furosemide 20 mg. I would like to try this again as it helped a lot when I took it 2 years ago.  I also would like to have my blood tested for anemia to make sure that its not recurring now as [place]   Wt Readings from Last 3 Encounters:  07/19/18 191 lb (86.6 kg)  08/07/17 192 lb 6.4 oz (87.3 kg)  08/05/17 195 lb 6.4 oz (88.6 kg)   BP Readings from Last 3 Encounters:  07/19/18 136/70  08/07/17 (!) 154/86  08/05/17 122/80   She has had leg swelling for years, has used lasix in the past.  She asked me for a refill but I wanted to check her sodium first as it was low at last check The swelling in her feet is ok in the am, will get worse as the day goes on She has chronic lung disease- she will see DR. Alva coming up.  Her lung disease sx are  stable She did a pulmonary rehab program last year and felt like it helped her.  She wanted to do this again but she was not allowed to repeat "since I did too well" Last CBC was normal  No new orthopnea, no weight gain I suspect this is venous insufficiency as opposed to CHF  Patient Active Problem List   Diagnosis Date Noted  . Restrictive lung disease 05/13/2016  . Moderate persistent asthma without complication 47/10/6281  . Current use of beta blocker 04/11/2016  . Other allergic rhinitis 04/11/2016  . Gastroesophageal reflux disease without esophagitis 04/11/2016  . Essential hypertension 02/20/2016  . Abdominal pain, acute, right upper quadrant 07/29/2013    Past Medical History:  Diagnosis Date  . Allergy   . Concussion   . Falls   . Head injury   . Herpes   . Hypertension   . Migraines   . Nerve damage   . Shingles     Past Surgical History:  Procedure Laterality Date  . BRAIN SURGERY    . CRANIOTOMY FOR EXTRACRANIAL-INTRACRANIAL BYPASS    . EYE SURGERY  4.4.17   milia excision  . HIP SURGERY     intracranial surgery  . NERVE  SURGERY    . ROOT CANAL    . TONSILLECTOMY      Social History   Tobacco Use  . Smoking status: Never Smoker  . Smokeless tobacco: Never Used  Substance Use Topics  . Alcohol use: Yes    Comment: social  . Drug use: No    Family History  Problem Relation Age of Onset  . Heart disease Father   . Heart attack Father     Allergies  Allergen Reactions  . Cephalosporins Shortness Of Breath    Breathing difficulty  . Ciprofloxacin Hives and Shortness Of Breath  . Keflex [Cephalexin] Hives  . Penicillins Hives  . Sulfacetamide Sodium Hives  . Xopenex [Levalbuterol Hcl] Anxiety and Other (See Comments)    Tremors with violent body chattering and shaking  . Pregabalin Other (See Comments)  . Aspirin Other (See Comments)    Stomach pain/burning  . Chromium Other (See Comments)    Blisters  . Erythromycin Other (See  Comments)    "stomach issues"  . Flu Virus Vaccine Other (See Comments)    Numbness in legs  . Hydrochlorothiazide Other (See Comments)    Migraine  . Influenza Vaccines Other (See Comments)    Numbness in legs  . Iodinated Diagnostic Agents     unknown unknown  . Iodine   . Lactose Other (See Comments)    "stomach issues"  . Lactose Intolerance (Gi) Other (See Comments)    "stomach issues"  . Latex   . Lisinopril Cough  . Nitrofuran Derivatives Hives, Itching and Swelling  . Petrolatum Nausea Only  . Shellfish Allergy Hives  . Sulfa Antibiotics Hives    Other reaction(s): Other (See Comments) Flank pain  . Troleandomycin Other (See Comments)    Unknown  . Zostavax [Zoster Vaccine Live] Other (See Comments)    Pt has been told by specialists not to receive vaccine d/t h/o herpes in bloodstream and shingles.  . Avelox [Moxifloxacin Hcl In Nacl] Nausea Only  . Cefdinir Nausea And Vomiting  . Eggs Or Egg-Derived Products     Per allergy testing, no known reaction. Pt states she was told she could probably eat 2-3 eggs weekly w/o issue.  . Levaquin [Levofloxacin] Hives, Palpitations and Other (See Comments)    Chest pain  . Percodan [Oxycodone-Aspirin] Nausea And Vomiting  . Prednisone Palpitations    No injections, can do the pills    Medication list has been reviewed and updated.  Current Outpatient Medications on File Prior to Visit  Medication Sig Dispense Refill  . albuterol (PROVENTIL HFA;VENTOLIN HFA) 108 (90 Base) MCG/ACT inhaler Inhale 2 puffs into the lungs every 6 (six) hours as needed for wheezing or shortness of breath. 1 Inhaler 6  . ALPRAZolam (XANAX) 0.25 MG tablet Take 1 tablet (0.25 mg total) by mouth 2 (two) times daily as needed. for anxiety.  Do not combine with any other sedating medication 20 tablet 0  . amLODipine (NORVASC) 5 MG tablet Take 1 tablet (5 mg total) by mouth daily. 90 tablet 0  . Ascorbic Acid (VITAMIN C) 100 MG tablet Take 1,000 mg by  mouth daily. Reported on 05/24/2015    . atenolol (TENORMIN) 25 MG tablet Take 1 tablet (25 mg total) by mouth daily. 90 tablet 3  . butalbital-acetaminophen-caffeine (FIORICET, ESGIC) 50-325-40 MG per tablet Take by mouth 2 (two) times daily as needed for headache.    . Cholecalciferol (D 5000) 5000 UNITS TABS daily at 0600.    Marland Kitchen co-enzyme  Q-10 30 MG capsule Take 30 mg by mouth daily.    . diclofenac sodium (VOLTAREN) 1 % GEL Apply 2 g topically 4 (four) times daily. 100 g 3  . erythromycin ophthalmic ointment 1 application.     . fluticasone (FLONASE) 50 MCG/ACT nasal spray Place 2 sprays into both nostrils daily. 16 g 5  . furosemide (LASIX) 20 MG tablet Take 1 tablet (20 mg total) by mouth daily. Use on occasion as needed for swelling 30 tablet 1  . lidocaine (LIDODERM) 5 % Place 1 patch onto the skin.     Marland Kitchen nebivolol (BYSTOLIC) 10 MG tablet Take 1 tablet (10 mg total) by mouth daily. 90 tablet 0  . omeprazole (PRILOSEC OTC) 20 MG tablet Take 30 mg by mouth daily.     . Prednicarbate 0.1 % CREA     . promethazine (PHENERGAN) 25 MG tablet Take 1 tablet (25 mg total) by mouth every 6 (six) hours as needed for nausea or vomiting. 30 tablet 0  . sodium chloride (V-R NASAL SPRAY SALINE) 0.65 % nasal spray Place 1 spray into the nose as needed. Use in both nostrils twice daily 30 mL 6  . valsartan (DIOVAN) 80 MG tablet TAKE 2 TABLETS(160 MG) BY MOUTH DAILY 180 tablet 1   No current facility-administered medications on file prior to visit.     Review of Systems:  As per HPI- otherwise negative.   Physical Examination: Vitals:   07/19/18 1520  BP: 136/70  Pulse: 60  Resp: 18  Temp: 97.8 F (36.6 C)  SpO2: 98%   Vitals:   07/19/18 1520  Weight: 191 lb (86.6 kg)  Height: 5' 2.5" (1.588 m)   Body mass index is 34.38 kg/m. Ideal Body Weight: Weight in (lb) to have BMI = 25: 138.6  GEN: WDWN, NAD, Non-toxic, A & O x 3, overweight, looks well  HEENT: Atraumatic, Normocephalic. Neck  supple. No masses, No LAD. Ears and Nose: No external deformity. CV: RRR, No M/G/R. No JVD. No thrill. No extra heart sounds. PULM: CTA B, no wheezes, crackles, rhonchi. No retractions. No resp. distress. No accessory muscle use. EXTR: No c/c NEURO Normal gait.  PSYCH: Normally interactive. Conversant. Not depressed or anxious appearing.  Calm demeanor.  Lower extremity swelling is present- more so on the left than the right.1+ left and trace right  Feet are warm and well perfused Normal pulses in both feet   Assessment and Plan: Essential hypertension - Plan: CBC, Comprehensive metabolic panel, Lipid panel, valsartan (DIOVAN) 80 MG tablet, nebivolol (BYSTOLIC) 10 MG tablet  Bilateral edema of lower extremity - Plan: furosemide (LASIX) 20 MG tablet  Panic attack - Plan: ALPRAZolam (XANAX) 0.25 MG tablet  following up on her BP and LE edema today She has used lasix in the past- will check her lytes prior to restarting this med, although I did write for it today BP is well controlled    Will plan further follow- up pending labs.  Wt Readings from Last 3 Encounters:  07/19/18 191 lb (86.6 kg)  08/07/17 192 lb 6.4 oz (87.3 kg)  08/05/17 195 lb 6.4 oz (88.6 kg)      Signed Lamar Blinks, MD  addnd 6/2- received her labs  Results for orders placed or performed in visit on 07/19/18  CBC  Result Value Ref Range   WBC 8.2 4.0 - 10.5 K/uL   RBC 3.94 3.87 - 5.11 Mil/uL   Platelets 281.0 150.0 - 400.0 K/uL   Hemoglobin  11.0 (L) 12.0 - 15.0 g/dL   HCT 33.4 (L) 36.0 - 46.0 %   MCV 84.7 78.0 - 100.0 fl   MCHC 32.9 30.0 - 36.0 g/dL   RDW 14.7 11.5 - 15.5 %  Comprehensive metabolic panel  Result Value Ref Range   Sodium 133 (L) 135 - 145 mEq/L   Potassium 4.4 3.5 - 5.1 mEq/L   Chloride 100 96 - 112 mEq/L   CO2 28 19 - 32 mEq/L   Glucose, Bld 119 (H) 70 - 99 mg/dL   BUN 15 6 - 23 mg/dL   Creatinine, Ser 0.71 0.40 - 1.20 mg/dL   Total Bilirubin 0.4 0.2 - 1.2 mg/dL   Alkaline  Phosphatase 78 39 - 117 U/L   AST 17 0 - 37 U/L   ALT 17 0 - 35 U/L   Total Protein 6.7 6.0 - 8.3 g/dL   Albumin 3.9 3.5 - 5.2 g/dL   Calcium 9.1 8.4 - 10.5 mg/dL   GFR 79.64 >60.00 mL/min  Lipid panel  Result Value Ref Range   Cholesterol 194 0 - 200 mg/dL   Triglycerides 74.0 0.0 - 149.0 mg/dL   HDL 69.30 >39.00 mg/dL   VLDL 14.8 0.0 - 40.0 mg/dL   LDL Cholesterol 110 (H) 0 - 99 mg/dL   Total CHOL/HDL Ratio 3    NonHDL 124.72     Colonoscopy was 10 years ago  Negative hemoccult a year ago Anemia first noted 6/19  Negative cologuard 12/19

## 2018-07-20 ENCOUNTER — Encounter: Payer: Self-pay | Admitting: Family Medicine

## 2018-07-20 LAB — LIPID PANEL
Cholesterol: 194 mg/dL (ref 0–200)
HDL: 69.3 mg/dL (ref >39.00)
LDL Cholesterol: 110 mg/dL — ABNORMAL HIGH (ref 0–99)
NonHDL: 124.72
Total CHOL/HDL Ratio: 3
Triglycerides: 74 mg/dL (ref 0.0–149.0)
VLDL: 14.8 mg/dL (ref 0.0–40.0)

## 2018-07-20 LAB — COMPREHENSIVE METABOLIC PANEL
ALT: 17 U/L (ref 0–35)
AST: 17 U/L (ref 0–37)
Albumin: 3.9 g/dL (ref 3.5–5.2)
Alkaline Phosphatase: 78 U/L (ref 39–117)
BUN: 15 mg/dL (ref 6–23)
CO2: 28 mEq/L (ref 19–32)
Calcium: 9.1 mg/dL (ref 8.4–10.5)
Chloride: 100 mEq/L (ref 96–112)
Creatinine, Ser: 0.71 mg/dL (ref 0.40–1.20)
GFR: 79.64 mL/min (ref >60.00)
Glucose, Bld: 119 mg/dL — ABNORMAL HIGH (ref 70–99)
Potassium: 4.4 mEq/L (ref 3.5–5.1)
Sodium: 133 mEq/L — ABNORMAL LOW (ref 135–145)
Total Bilirubin: 0.4 mg/dL (ref 0.2–1.2)
Total Protein: 6.7 g/dL (ref 6.0–8.3)

## 2018-07-20 LAB — CBC
HCT: 33.4 % — ABNORMAL LOW (ref 36.0–46.0)
Hemoglobin: 11 g/dL — ABNORMAL LOW (ref 12.0–15.0)
MCHC: 32.9 g/dL (ref 30.0–36.0)
MCV: 84.7 fl (ref 78.0–100.0)
Platelets: 281 10*3/uL (ref 150.0–400.0)
RBC: 3.94 Mil/uL (ref 3.87–5.11)
RDW: 14.7 % (ref 11.5–15.5)
WBC: 8.2 10*3/uL (ref 4.0–10.5)

## 2018-07-27 ENCOUNTER — Encounter: Payer: Self-pay | Admitting: Family Medicine

## 2018-07-27 DIAGNOSIS — M62838 Other muscle spasm: Secondary | ICD-10-CM | POA: Diagnosis not present

## 2018-07-27 DIAGNOSIS — B0229 Other postherpetic nervous system involvement: Secondary | ICD-10-CM | POA: Diagnosis not present

## 2018-07-27 DIAGNOSIS — M25551 Pain in right hip: Secondary | ICD-10-CM | POA: Diagnosis not present

## 2018-07-27 DIAGNOSIS — G4489 Other headache syndrome: Secondary | ICD-10-CM | POA: Diagnosis not present

## 2018-07-28 ENCOUNTER — Other Ambulatory Visit: Payer: Self-pay | Admitting: Family Medicine

## 2018-07-28 DIAGNOSIS — I10 Essential (primary) hypertension: Secondary | ICD-10-CM

## 2018-08-26 ENCOUNTER — Encounter: Payer: Self-pay | Admitting: Family Medicine

## 2018-09-19 ENCOUNTER — Encounter: Payer: Self-pay | Admitting: Family Medicine

## 2018-10-07 ENCOUNTER — Other Ambulatory Visit: Payer: Self-pay | Admitting: Family Medicine

## 2018-10-07 DIAGNOSIS — I10 Essential (primary) hypertension: Secondary | ICD-10-CM

## 2018-10-19 DIAGNOSIS — R21 Rash and other nonspecific skin eruption: Secondary | ICD-10-CM | POA: Diagnosis not present

## 2018-10-19 DIAGNOSIS — L400 Psoriasis vulgaris: Secondary | ICD-10-CM | POA: Diagnosis not present

## 2018-11-10 DIAGNOSIS — Z5181 Encounter for therapeutic drug level monitoring: Secondary | ICD-10-CM | POA: Diagnosis not present

## 2018-11-10 DIAGNOSIS — M62838 Other muscle spasm: Secondary | ICD-10-CM | POA: Diagnosis not present

## 2018-11-10 DIAGNOSIS — B0229 Other postherpetic nervous system involvement: Secondary | ICD-10-CM | POA: Diagnosis not present

## 2018-11-10 DIAGNOSIS — Z79899 Other long term (current) drug therapy: Secondary | ICD-10-CM | POA: Diagnosis not present

## 2018-11-10 DIAGNOSIS — G4489 Other headache syndrome: Secondary | ICD-10-CM | POA: Diagnosis not present

## 2018-11-10 DIAGNOSIS — M25551 Pain in right hip: Secondary | ICD-10-CM | POA: Diagnosis not present

## 2018-12-09 DIAGNOSIS — L820 Inflamed seborrheic keratosis: Secondary | ICD-10-CM | POA: Diagnosis not present

## 2018-12-13 DIAGNOSIS — Z79899 Other long term (current) drug therapy: Secondary | ICD-10-CM | POA: Diagnosis not present

## 2018-12-13 DIAGNOSIS — H43813 Vitreous degeneration, bilateral: Secondary | ICD-10-CM | POA: Diagnosis not present

## 2018-12-13 DIAGNOSIS — H11823 Conjunctivochalasis, bilateral: Secondary | ICD-10-CM | POA: Diagnosis not present

## 2018-12-13 DIAGNOSIS — H2513 Age-related nuclear cataract, bilateral: Secondary | ICD-10-CM | POA: Diagnosis not present

## 2018-12-13 DIAGNOSIS — H11441 Conjunctival cysts, right eye: Secondary | ICD-10-CM | POA: Diagnosis not present

## 2018-12-13 DIAGNOSIS — H33311 Horseshoe tear of retina without detachment, right eye: Secondary | ICD-10-CM | POA: Diagnosis not present

## 2018-12-29 NOTE — Progress Notes (Signed)
Virtual Visit via Video Note  I connected with patient on 12/30/18 at  3:15 PM EST by audio enabled telemedicine application and verified that I am speaking with the correct person using two identifiers.   THIS ENCOUNTER IS A VIRTUAL VISIT DUE TO COVID-19 - PATIENT WAS NOT SEEN IN THE OFFICE. PATIENT HAS CONSENTED TO VIRTUAL VISIT / TELEMEDICINE VISIT   Location of patient: home  Location of provider: office  I discussed the limitations of evaluation and management by telemedicine and the availability of in person appointments. The patient expressed understanding and agreed to proceed.   Subjective:   Angela Gibbs is a 78 y.o. female who presents for Medicare Annual (Subsequent) preventive examination.  Review of Systems:  Home Safety/Smoke Alarms: Feels safe in home. Smoke alarms in place.  Lives w/ husband in 2 story . No issues navigating stairs.    Female:      Mammo-  Does w/ OBGYN. Pt reports she is utd. Dexa scan- does w/OBGYN. Pt reports she is utd.        CCS- Cologuard 01/06/18    Objective:     Vitals: BP 130/78 Comment: pt reported    Advanced Directives 12/30/2018 03/06/2017 08/03/2016 11/24/2014  Does Patient Have a Medical Advance Directive? Yes Yes No Yes  Type of Paramedic of [place];Living will [place];Living will - [place];Living will  Does patient want to make changes to medical advance directive? No - Patient declined No - Patient declined - -  Copy of [place] in Chart? No - copy requested No - copy requested - -    Tobacco Social History   Tobacco Use  Smoking Status Never Smoker  Smokeless Tobacco Never Used     Counseling given: Not Answered   Clinical Intake:  Pain : No/denies pain      Past Medical History:  Diagnosis Date   Allergy    Concussion    Falls    Head injury    Herpes    Hypertension    Migraines    Nerve damage     Shingles    Past Surgical History:  Procedure Laterality Date   BRAIN SURGERY     CRANIOTOMY FOR EXTRACRANIAL-INTRACRANIAL BYPASS     EYE SURGERY  4.4.17   milia excision   HIP SURGERY     intracranial surgery   NERVE SURGERY     ROOT CANAL     TONSILLECTOMY     Family History  Problem Relation Age of Onset   Heart disease Father    Heart attack Father    Social History   Socioeconomic History   Marital status: Married    Spouse name: Not on file   Number of children: Not on file   Years of education: Not on file   Highest education level: Not on file  Occupational History   Not on file  Social Needs   Financial resource strain: Not on file   Food insecurity    Worry: Not on file    Inability: Not on file   Transportation needs    Medical: Not on file    Non-medical: Not on file  Tobacco Use   Smoking status: Never Smoker   Smokeless tobacco: Never Used  Substance and Sexual Activity   Alcohol use: Yes    Comment: social   Drug use: No   Sexual activity: Yes  Lifestyle   Physical activity  Days per week: Not on file    Minutes per session: Not on file   Stress: Not on file  Relationships   Social connections    Talks on phone: Not on file    Gets together: Not on file    Attends religious service: Not on file    Active member of club or organization: Not on file    Attends meetings of clubs or organizations: Not on file    Relationship status: Not on file  Other Topics Concern   Not on file  Social History Narrative   Not on file    Outpatient Encounter Medications as of 12/30/2018  Medication Sig   albuterol (PROVENTIL HFA;VENTOLIN HFA) 108 (90 Base) MCG/ACT inhaler Inhale 2 puffs into the lungs every 6 (six) hours as needed for wheezing or shortness of breath.   ALPRAZolam (XANAX) 0.25 MG tablet Take 1 tablet (0.25 mg total) by mouth 2 (two) times daily as needed for anxiety.   amLODipine (NORVASC) 5 MG tablet TAKE  1 TABLET(5 MG) BY MOUTH DAILY   Ascorbic Acid (VITAMIN C) 100 MG tablet Take 1,000 mg by mouth daily. Reported on 05/24/2015   butalbital-acetaminophen-caffeine (FIORICET, ESGIC) 50-325-40 MG per tablet Take by mouth 2 (two) times daily as needed for headache.   BYSTOLIC 10 MG tablet TAKE 1 TABLET BY MOUTH EVERY DAY   Cholecalciferol (D 5000) 5000 UNITS TABS daily at 0600.   co-enzyme Q-10 30 MG capsule Take 30 mg by mouth daily.   diclofenac sodium (VOLTAREN) 1 % GEL Apply 2 g topically 4 (four) times daily.   erythromycin ophthalmic ointment 1 application.    fluticasone (FLONASE) 50 MCG/ACT nasal spray Place 2 sprays into both nostrils daily.   furosemide (LASIX) 20 MG tablet TAKE 1 TABLET BY MOUTH EVERY DAY ON OCCASION AS NEEDED FOR SWELLING   lidocaine (LIDODERM) 5 % Place 1 patch onto the skin.    omeprazole (PRILOSEC OTC) 20 MG tablet Take 30 mg by mouth daily.    Prednicarbate 0.1 % CREA    promethazine (PHENERGAN) 25 MG tablet Take 1 tablet (25 mg total) by mouth every 6 (six) hours as needed for nausea or vomiting.   sodium chloride (V-R NASAL SPRAY SALINE) 0.65 % nasal spray Place 1 spray into the nose as needed. Use in both nostrils twice daily   valsartan (DIOVAN) 80 MG tablet Take 1 tablet (80 mg total) by mouth 2 (two) times daily.   atenolol (TENORMIN) 25 MG tablet Take 1 tablet (25 mg total) by mouth daily. (Patient not taking: Reported on 12/30/2018)   [DISCONTINUED] ALPRAZolam (XANAX) 0.25 MG tablet Take 1 tablet (0.25 mg total) by mouth 2 (two) times daily as needed. for anxiety.  Do not combine with any other sedating medication   No facility-administered encounter medications on file as of 12/30/2018.     Activities of Daily Living In your present state of health, do you have any difficulty performing the following activities: 12/30/2018  Hearing? N  Vision? N  Difficulty concentrating or making decisions? N  Walking or climbing stairs? N  Dressing or  bathing? N  Doing errands, shopping? N  Preparing Food and eating ? N  Using the Toilet? N  In the past six months, have you accidently leaked urine? N  Do you have problems with loss of bowel control? N  Managing your Medications? N  Managing your Finances? N  Housekeeping or managing your Housekeeping? N  Some recent data might be hidden  Patient Care Team: Copland, Gay Filler, MD as PCP - General (Family Medicine) Luellen Pucker, MD as Consulting Physician (Obstetrics and Gynecology) Rosendo Gros, Marnee Spring, OD as Consulting Physician (Ophthalmology) Sabra Heck Precious Haws, MD as Consulting Physician (Neurology) Sylvie Farrier, NP as Consulting Physician (Pain Medicine) Rolan Lipa, MD as Consulting Physician (Gastroenterology) Kerney Elbe, MD as Consulting Physician (Internal Medicine) Lucia Bitter., MD as Physician Assistant (Pain Medicine)    Assessment:   This is a routine wellness examination for [place]. Physical assessment deferred to PCP.  Exercise Activities and Dietary recommendations Current Exercise Habits: Home exercise routine, Type of exercise: walking, Time (Minutes): 30, Frequency (Times/Week): 3, Weekly Exercise (Minutes/Week): 90, Intensity: Mild, Exercise limited by: None identified   Diet (meal preparation, eat out, water intake, caffeinated beverages, dairy products, fruits and vegetables): well balanced   Goals     Lose 20lb     Follow weight watchers or other healthy diet       Fall Risk Fall Risk  03/06/2017 05/24/2015 05/24/2015  Falls in the past year? Yes No No  Number falls in past yr: 1 - -  Injury with Fall? No - -  Follow up Education provided;Falls prevention discussed - -    Depression Screen PHQ 2/9 Scores 03/06/2017 05/24/2015 05/24/2015  PHQ - 2 Score 0 0 -  Exception Documentation - Patient refusal Patient refusal     Cognitive Function Ad8 score reviewed for issues:  Issues making decisions:no  Less  interest in hobbies / activities:no  Repeats questions, stories (family complaining):no  Trouble using ordinary gadgets (microwave, computer, phone):no  Forgets the month or year: no  Mismanaging finances: no  Remembering appts:no  Daily problems with thinking and/or memory:no Ad8 score is=0       Immunization History  Administered Date(s) Administered   Pneumococcal Conjugate-13 11/10/2013, 02/20/2016   Td 06/11/2016   Tdap 02/17/2006    Screening Tests Health Maintenance  Topic Date Due   PNA vac Low Risk Adult (2 of 2 - PPSV23) 02/19/2017   TETANUS/TDAP  06/12/2026   DEXA SCAN  Completed      Plan:    Please schedule your next medicare wellness visit with me in 1 yr.  Continue to eat heart healthy diet (full of fruits, vegetables, whole grains, lean protein, water--limit salt, fat, and sugar intake) and increase physical activity as tolerated.  Continue doing brain stimulating activities (puzzles, reading, adult coloring books, staying active) to keep memory sharp.   Bring a copy of your living will and/or healthcare power of attorney to your next office visit.   I have personally reviewed and noted the following in the patients chart:    Medical and social history  Use of alcohol, tobacco or illicit drugs   Current medications and supplements  Functional ability and status  Nutritional status  Physical activity  Advanced directives  List of other physicians  Hospitalizations, surgeries, and ER visits in previous 12 months  Vitals  Screenings to include cognitive, depression, and falls  Referrals and appointments  In addition, I have reviewed and discussed with patient certain preventive protocols, quality metrics, and best practice recommendations. A written personalized care plan for preventive services as well as general preventive health recommendations were provided to patient.     Shela Nevin, [place]  12/30/2018

## 2018-12-30 ENCOUNTER — Encounter: Payer: Self-pay | Admitting: *Deleted

## 2018-12-30 ENCOUNTER — Other Ambulatory Visit: Payer: Self-pay

## 2018-12-30 ENCOUNTER — Ambulatory Visit (INDEPENDENT_AMBULATORY_CARE_PROVIDER_SITE_OTHER): Payer: Medicare Other | Admitting: *Deleted

## 2018-12-30 VITALS — BP 130/78

## 2018-12-30 DIAGNOSIS — Z0000 Encounter for general adult medical examination without abnormal findings: Secondary | ICD-10-CM | POA: Diagnosis not present

## 2018-12-30 NOTE — Patient Instructions (Signed)
Please schedule your next medicare wellness visit with me in 1 yr.  Continue to eat heart healthy diet (full of fruits, vegetables, whole grains, lean protein, water--limit salt, fat, and sugar intake) and increase physical activity as tolerated.  Continue doing brain stimulating activities (puzzles, reading, adult coloring books, staying active) to keep memory sharp.   Bring a copy of your living will and/or healthcare power of attorney to your next office visit.   [name] , Thank you for taking time to come for your Medicare Wellness Visit. I appreciate your ongoing commitment to your health goals. Please review the following plan we discussed and let me know if I can assist you in the future.   These are the goals we discussed: Goals    . Lose 20lb     Follow weight watchers or other healthy diet       This is a list of the screening recommended for you and due dates:  Health Maintenance  Topic Date Due  . Pneumonia vaccines (2 of 2 - PPSV23) 02/19/2017  . Tetanus Vaccine  06/12/2026  . DEXA scan (bone density measurement)  Completed    Preventive Care 65 Years and Older, Female Preventive care refers to lifestyle choices and visits with your health care provider that can promote health and wellness. This includes:  A yearly physical exam. This is also called an annual well check.  Regular dental and eye exams.  Immunizations.  Screening for certain conditions.  Healthy lifestyle choices, such as diet and exercise. What can I expect for my preventive care visit? Physical exam Your health care provider will check:  Height and weight. These may be used to calculate body mass index (BMI), which is a measurement that tells if you are at a healthy weight.  Heart rate and blood pressure.  Your skin for abnormal spots. Counseling Your health care provider may ask you questions about:  Alcohol, tobacco, and drug use.  Emotional well-being.  Home and relationship  well-being.  Sexual activity.  Eating habits.  History of falls.  Memory and ability to understand (cognition).  Work and work Statistician.  Pregnancy and menstrual history. What immunizations do I need?  Influenza (flu) vaccine  This is recommended every year. Tetanus, diphtheria, and pertussis (Tdap) vaccine  You may need a Td booster every 10 years. Varicella (chickenpox) vaccine  You may need this vaccine if you have not already been vaccinated. Zoster (shingles) vaccine  You may need this after age 40. Pneumococcal conjugate (PCV13) vaccine  One dose is recommended after age 78. Pneumococcal polysaccharide (PPSV23) vaccine  One dose is recommended after age 78. Measles, mumps, and rubella (MMR) vaccine  You may need at least one dose of MMR if you were born in 1957 or later. You may also need a second dose. Meningococcal conjugate (MenACWY) vaccine  You may need this if you have certain conditions. Hepatitis A vaccine  You may need this if you have certain conditions or if you travel or work in places where you may be exposed to hepatitis A. Hepatitis B vaccine  You may need this if you have certain conditions or if you travel or work in places where you may be exposed to hepatitis B. Haemophilus influenzae type b (Hib) vaccine  You may need this if you have certain conditions. You may receive vaccines as individual doses or as more than one vaccine together in one shot (combination vaccines). Talk with your health care provider about the risks  and benefits of combination vaccines. What tests do I need? Blood tests  Lipid and cholesterol levels. These may be checked every 5 years, or more frequently depending on your overall health.  Hepatitis C test.  Hepatitis B test. Screening  Lung cancer screening. You may have this screening every year starting at age 78 if you have a 30-pack-year history of smoking and currently smoke or have quit within the past  15 years.  Colorectal cancer screening. All adults should have this screening starting at age 78 and continuing until age 78. Your health care provider may recommend screening at age 44 if you are at increased risk. You will have tests every 1-10 years, depending on your results and the type of screening test.  Diabetes screening. This is done by checking your blood sugar (glucose) after you have not eaten for a while (fasting). You may have this done every 1-3 years.  Mammogram. This may be done every 1-2 years. Talk with your health care provider about how often you should have regular mammograms.  BRCA-related cancer screening. This may be done if you have a family history of breast, ovarian, tubal, or peritoneal cancers. Other tests  Sexually transmitted disease (STD) testing.  Bone density scan. This is done to screen for osteoporosis. You may have this done starting at age 78. Follow these instructions at home: Eating and drinking  Eat a diet that includes fresh fruits and vegetables, whole grains, lean protein, and low-fat dairy products. Limit your intake of foods with high amounts of sugar, saturated fats, and salt.  Take vitamin and mineral supplements as recommended by your health care provider.  Do not drink alcohol if your health care provider tells you not to drink.  If you drink alcohol: ? Limit how much you have to 0-1 drink a day. ? Be aware of how much alcohol is in your drink. In the [place] one drink equals one 12 oz bottle of beer (355 mL), one 5 oz glass of wine (148 mL), or one 1 oz glass of hard liquor (44 mL). Lifestyle  Take daily care of your teeth and gums.  Stay active. Exercise for at least 30 minutes on 5 or more days each week.  Do not use any products that contain nicotine or tobacco, such as cigarettes, e-cigarettes, and chewing tobacco. If you need help quitting, ask your health care provider.  If you are sexually active, practice safe sex. Use a  condom or other form of protection in order to prevent STIs (sexually transmitted infections).  Talk with your health care provider about taking a low-dose aspirin or statin. What's next?  Go to your health care provider once a year for a well check visit.  Ask your health care provider how often you should have your eyes and teeth checked.  Stay up to date on all vaccines. This information is not intended to replace advice given to you by your health care provider. Make sure you discuss any questions you have with your health care provider. Document Released: 03/02/2015 Document Revised: 01/28/2018 Document Reviewed: 01/28/2018 Elsevier Patient Education  2020 [employer].

## 2019-01-06 ENCOUNTER — Encounter: Payer: Self-pay | Admitting: Family Medicine

## 2019-01-06 DIAGNOSIS — Z20828 Contact with and (suspected) exposure to other viral communicable diseases: Secondary | ICD-10-CM

## 2019-01-06 DIAGNOSIS — Z20822 Contact with and (suspected) exposure to covid-19: Secondary | ICD-10-CM

## 2019-01-12 ENCOUNTER — Other Ambulatory Visit: Payer: Self-pay

## 2019-01-23 ENCOUNTER — Encounter: Payer: Self-pay | Admitting: Family Medicine

## 2019-01-24 ENCOUNTER — Encounter: Payer: Self-pay | Admitting: Family Medicine

## 2019-01-24 ENCOUNTER — Ambulatory Visit (HOSPITAL_BASED_OUTPATIENT_CLINIC_OR_DEPARTMENT_OTHER)
Admission: RE | Admit: 2019-01-24 | Discharge: 2019-01-24 | Disposition: A | Discharge status: Home / Self Care | Payer: Medicare Other | Source: Ambulatory Visit | Attending: Family Medicine | Admitting: Family Medicine

## 2019-01-24 ENCOUNTER — Other Ambulatory Visit: Payer: Self-pay

## 2019-01-24 ENCOUNTER — Ambulatory Visit (INDEPENDENT_AMBULATORY_CARE_PROVIDER_SITE_OTHER): Payer: Medicare Other | Admitting: Family Medicine

## 2019-01-24 VITALS — BP 130/80 | HR 72 | Temp 96.8°F | Resp 21 | Ht 62.5 in | Wt 195.0 lb

## 2019-01-24 DIAGNOSIS — Z23 Encounter for immunization: Secondary | ICD-10-CM

## 2019-01-24 DIAGNOSIS — R600 Localized edema: Secondary | ICD-10-CM | POA: Diagnosis not present

## 2019-01-24 DIAGNOSIS — M7989 Other specified soft tissue disorders: Secondary | ICD-10-CM | POA: Diagnosis not present

## 2019-01-24 DIAGNOSIS — R0602 Shortness of breath: Secondary | ICD-10-CM

## 2019-01-24 NOTE — Patient Instructions (Signed)
Good to see you today!  Please go to the lab, and then come in for your leg ultrasound at 4:15 or so I will be in touch with your reports asap Pneumonia booster given today

## 2019-01-24 NOTE — Progress Notes (Addendum)
[facility] [place], [place], [place] [address] [phone]  Date:  01/24/2019   Name:  Angela Gibbs   DOB:  08-08-1940   MRN:  [id number]  PCP:  Darreld Mclean, MD    Chief Complaint: Leg Swelling (leg swelling, couple of weeks, aching at calf, feet swelling)   History of Present Illness:  Angela Gibbs is a 78 y.o. very pleasant female patient who presents with the following:  Patient with history of asthma, restrictive lung disease, hypertension Here today with concern of leg pain and swelling Last seen by myself in June of this year She has had leg swelling for several years, has used Lasix as needed Most recent labs in June showed minimal hyponatremia Can give Pneumovax booster today   She sent [place] the following my chart message yesterday- Dear Dr. Edilia Bo,  I have been tolerating very swollen legs, not just feet and ankles, but the whole leg is swollen from the foot to mid thigh for the last couple of weeks.  Now, the left leg is beginning to ache (mid-calf).  Help!  I need to be seen as soon as possible please  She notes swelling gradually getting worse- about 6 months ago worse, and then worse again after she traveled over thanksgiving.  They were in the car for about 4 hours over the Thanksgiving holiday-since this time she has noted more pain in her left calf and increased swelling Her legs improve a lot overnight but but get worse as the day goes on The left is generally worse than the right, other both can swell  She has had his for a long time but much worse now Typically is worse if her legs are dependent, or if she is not able to wear her compression socks  [place] the [place] Her sister was just dx with psoriatic arthritis No significant SOB and no orthopnea-she may get a bit short of breath if she is hurrying Her weight has been basically stable No history of CHF She is  taking lasix on occasion but it does not seem to be helping  She has some lasix 20 mg- she did take it yesterday  She noted some pain in her left calf for the last 10 days or so as well  4:30 appointment time for Doppler  Wt Readings from Last 3 Encounters:  01/24/19 195 lb (88.5 kg)  07/19/18 191 lb (86.6 kg)  08/07/17 192 lb 6.4 oz (87.3 kg)    Patient Active Problem List   Diagnosis Date Noted  . Restrictive lung disease 05/13/2016  . Moderate persistent asthma without complication [id number]  . Current use of beta blocker 04/11/2016  . Other allergic rhinitis 04/11/2016  . Gastroesophageal reflux disease without esophagitis 04/11/2016  . Essential hypertension 02/20/2016  . Abdominal pain, acute, right upper quadrant 07/29/2013    Past Medical History:  Diagnosis Date  . Allergy   . Concussion   . Falls   . Head injury   . Herpes   . Hypertension   . Migraines   . Nerve damage   . Shingles     Past Surgical History:  Procedure Laterality Date  . BRAIN SURGERY    . CRANIOTOMY FOR EXTRACRANIAL-INTRACRANIAL BYPASS    . EYE SURGERY  4.4.17   milia excision  . HIP SURGERY     intracranial surgery  . NERVE SURGERY    .  ROOT CANAL    . TONSILLECTOMY      Social History   Tobacco Use  . Smoking status: Never Smoker  . Smokeless tobacco: Never Used  Substance Use Topics  . Alcohol use: Yes    Comment: social  . Drug use: No    Family History  Problem Relation Age of Onset  . Heart disease Father   . Heart attack Father     Allergies  Allergen Reactions  . Cephalosporins Shortness Of Breath    Breathing difficulty  . Ciprofloxacin Hives and Shortness Of Breath  . Keflex [Cephalexin] Hives  . Penicillins Hives  . Sulfacetamide Sodium Hives  . Xopenex [Levalbuterol Hcl] Anxiety and Other (See Comments)    Tremors with violent body chattering and shaking  . Pregabalin Other (See Comments)  . Aspirin Other (See Comments)    Stomach pain/burning  .  Chromium Other (See Comments)    Blisters  . Erythromycin Other (See Comments)    "stomach issues"  . Flu Virus Vaccine Other (See Comments)    Numbness in legs  . Hydrochlorothiazide Other (See Comments)    Migraine  . Influenza Vaccines Other (See Comments)    Numbness in legs  . Iodinated Diagnostic Agents     unknown unknown  . Iodine   . Lactose Other (See Comments)    "stomach issues"  . Lactose Intolerance (Gi) Other (See Comments)    "stomach issues"  . Latex   . Lisinopril Cough  . Nitrofuran Derivatives Hives, Itching and Swelling  . Petrolatum Nausea Only  . Shellfish Allergy Hives  . Sulfa Antibiotics Hives    Other reaction(s): Other (See Comments) Flank pain  . Troleandomycin Other (See Comments)    Unknown  . Zostavax [Zoster Vaccine Live] Other (See Comments)    Pt has been told by specialists not to receive vaccine d/t h/o herpes in bloodstream and shingles.  . Avelox [Moxifloxacin Hcl In Nacl] Nausea Only  . Cefdinir Nausea And Vomiting  . Eggs Or Egg-Derived Products     Per allergy testing, no known reaction. Pt states she was told she could probably eat 2-3 eggs weekly w/o issue.  . Levaquin [Levofloxacin] Hives, Palpitations and Other (See Comments)    Chest pain  . Percodan [Oxycodone-Aspirin] Nausea And Vomiting  . Prednisone Palpitations    No injections, can do the pills    Medication list has been reviewed and updated.  Current Outpatient Medications on File Prior to Visit  Medication Sig Dispense Refill  . albuterol (PROVENTIL HFA;VENTOLIN HFA) 108 (90 Base) MCG/ACT inhaler Inhale 2 puffs into the lungs every 6 (six) hours as needed for wheezing or shortness of breath. 1 Inhaler 6  . ALPRAZolam (XANAX) 0.25 MG tablet Take 1 tablet (0.25 mg total) by mouth 2 (two) times daily as needed for anxiety. 30 tablet 1  . amLODipine (NORVASC) 5 MG tablet TAKE 1 TABLET(5 MG) BY MOUTH DAILY 90 tablet 1  . Ascorbic Acid (VITAMIN C) 100 MG tablet Take  1,000 mg by mouth daily. Reported on 05/24/2015    . butalbital-acetaminophen-caffeine (FIORICET, ESGIC) 50-325-40 MG per tablet Take by mouth 2 (two) times daily as needed for headache.    Marland Kitchen BYSTOLIC 10 MG tablet TAKE 1 TABLET BY MOUTH EVERY DAY 90 tablet 3  . Cholecalciferol (D 5000) 5000 UNITS TABS daily at 0600.    Marland Kitchen co-enzyme Q-10 30 MG capsule Take 30 mg by mouth daily.    . diclofenac sodium (VOLTAREN) 1 %  GEL Apply 2 g topically 4 (four) times daily. 100 g 3  . erythromycin ophthalmic ointment 1 application.     . fluticasone (FLONASE) 50 MCG/ACT nasal spray Place 2 sprays into both nostrils daily. 16 g 5  . furosemide (LASIX) 20 MG tablet TAKE 1 TABLET BY MOUTH EVERY DAY ON OCCASION AS NEEDED FOR SWELLING 90 tablet 1  . lidocaine (LIDODERM) 5 % Place 1 patch onto the skin.     Marland Kitchen omeprazole (PRILOSEC OTC) 20 MG tablet Take 30 mg by mouth daily.     . Prednicarbate 0.1 % CREA     . promethazine (PHENERGAN) 25 MG tablet Take 1 tablet (25 mg total) by mouth every 6 (six) hours as needed for nausea or vomiting. 30 tablet 0  . sodium chloride (V-R NASAL SPRAY SALINE) 0.65 % nasal spray Place 1 spray into the nose as needed. Use in both nostrils twice daily 30 mL 6  . valsartan (DIOVAN) 80 MG tablet Take 1 tablet (80 mg total) by mouth 2 (two) times daily. 180 tablet 3  . atenolol (TENORMIN) 25 MG tablet Take 1 tablet (25 mg total) by mouth daily. (Patient not taking: Reported on 12/30/2018) 90 tablet 3   No current facility-administered medications on file prior to visit.     Review of Systems:  As per HPI- otherwise negative.   Physical Examination: Vitals:   01/24/19 1359 01/24/19 1425  BP: (!) 160/78 130/80  Pulse: 72   Resp: (!) 21   Temp: (!) 96.8 F (36 C)   SpO2: 98%    Vitals:   01/24/19 1359  Weight: 195 lb (88.5 kg)  Height: 5' 2.5" (1.588 m)   Body mass index is 35.1 kg/m. Ideal Body Weight: Weight in (lb) to have BMI = 25: 138.6  GEN: WDWN, NAD, Non-toxic, A &  O x 3, obese, looks well HEENT: Atraumatic, Normocephalic. Neck supple. No masses, No LAD. Ears and Nose: No external deformity. CV: RRR, No M/G/R. No JVD. No thrill. No extra heart sounds. PULM: CTA B, no wheezes, crackles, rhonchi. No retractions. No resp. distress. No accessory muscle use. ABD: S, NT, ND, +BS. No rebound. No HSM. EXTR: No c/c/e NEURO Normal gait.  PSYCH: Normally interactive. Conversant. Not depressed or anxious appearing.  Calm demeanor.  Both LE with strong pulses  She has edema in both legs, trace on the right in 1+ to 2 on the left.  Edema is pitting.  She does have some tenderness of the left lateral calf  Assessment and Plan: Leg swelling - Plan: US Venous Img Lower Bilateral, Comprehensive metabolic panel, CBC, B Nat Peptide  SOB (shortness of breath) - Plan: B Nat Peptide  Immunization due - Plan: Pneumococcal (PPSV23) vaccine  Pneumonia booster needed, given today Here today with lower extremity swelling Suspect this is venous insufficiency, would like to rule out a DVT and also check BMP today We will be in touch with patient after her venous ultrasound is completed later on this afternoon and plan further care at that time   This visit occurred during the SARS-CoV-2 public health emergency.  Safety protocols were in place, including screening questions prior to the visit, additional usage of staff PPE, and extensive cleaning of exam room while observing appropriate contact time as indicated for disinfecting solutions.    Signed Lamar Blinks, MD  Received her doppler- negative for any DVT Called pt and let her know Await her other labs to make further plans ?dc amlodipine might  help US Venous Img Lower Bilateral  Result Date: 01/24/2019 CLINICAL DATA:  Bilateral lower extremity pain and swelling for the past 6 days, left calf pain for the past 4 days. Evaluate for DVT. EXAM: BILATERAL LOWER EXTREMITY VENOUS DOPPLER ULTRASOUND TECHNIQUE: Gray-scale  sonography with graded compression, as well as color Doppler and duplex ultrasound were performed to evaluate the lower extremity deep venous systems from the level of the common femoral vein and including the common femoral, femoral, profunda femoral, popliteal and calf veins including the posterior tibial, peroneal and gastrocnemius veins when visible. The superficial great saphenous vein was also interrogated. Spectral Doppler was utilized to evaluate flow at rest and with distal augmentation maneuvers in the common femoral, femoral and popliteal veins. COMPARISON:  None. FINDINGS: RIGHT LOWER EXTREMITY Common Femoral Vein: No evidence of thrombus. Normal compressibility, respiratory phasicity and response to augmentation. Saphenofemoral Junction: No evidence of thrombus. Normal compressibility and flow on color Doppler imaging. Profunda Femoral Vein: No evidence of thrombus. Normal compressibility and flow on color Doppler imaging. Femoral Vein: No evidence of thrombus. Normal compressibility, respiratory phasicity and response to augmentation. Popliteal Vein: No evidence of thrombus. Normal compressibility, respiratory phasicity and response to augmentation. Calf Veins: No evidence of thrombus. Normal compressibility and flow on color Doppler imaging. Superficial Great Saphenous Vein: No evidence of thrombus. Normal compressibility. Venous Reflux:  None. Other Findings: There is a moderate amount of subcutaneous edema at the level of the right lower leg and ankle. LEFT LOWER EXTREMITY Common Femoral Vein: No evidence of thrombus. Normal compressibility, respiratory phasicity and response to augmentation. Saphenofemoral Junction: No evidence of thrombus. Normal compressibility and flow on color Doppler imaging. Profunda Femoral Vein: No evidence of thrombus. Normal compressibility and flow on color Doppler imaging. Femoral Vein: No evidence of thrombus. Normal compressibility, respiratory phasicity and response  to augmentation. Popliteal Vein: No evidence of thrombus. Normal compressibility, respiratory phasicity and response to augmentation. Calf Veins: No evidence of thrombus. Normal compressibility and flow on color Doppler imaging. Superficial Great Saphenous Vein: No evidence of thrombus. Normal compressibility. Venous Reflux:  None. Other Findings: There is a moderate amount of subcutaneous edema at the level of the left lower leg and ankle. IMPRESSION: No evidence of DVT within either lower extremity. Electronically Signed   By: Sandi Mariscal M.D.   On: 01/24/2019 18:05     Addendum 12/9, received her labs Message to patient   Results for orders placed or performed in visit on 01/24/19  Comprehensive metabolic panel  Result Value Ref Range   Sodium 133 (L) 135 - 145 mEq/L   Potassium 4.4 3.5 - 5.1 mEq/L   Chloride 99 96 - 112 mEq/L   CO2 27 19 - 32 mEq/L   Glucose, Bld 96 70 - 99 mg/dL   BUN 16 6 - 23 mg/dL   Creatinine, Ser 0.73 0.40 - 1.20 mg/dL   Total Bilirubin 0.5 0.2 - 1.2 mg/dL   Alkaline Phosphatase 90 39 - 117 U/L   AST 20 0 - 37 U/L   ALT 18 0 - 35 U/L   Total Protein 6.8 6.0 - 8.3 g/dL   Albumin 4.1 3.5 - 5.2 g/dL   GFR 77.02 >60.00 mL/min   Calcium 9.5 8.4 - 10.5 mg/dL  CBC  Result Value Ref Range   WBC 6.9 4.0 - 10.5 K/uL   RBC 4.05 3.87 - 5.11 Mil/uL   Platelets 305.0 150.0 - 400.0 K/uL   Hemoglobin 12.1 12.0 - 15.0 g/dL   HCT  35.8 (L) 36.0 - 46.0 %   MCV 88.6 78.0 - 100.0 fl   MCHC 33.7 30.0 - 36.0 g/dL   RDW 14.5 11.5 - 15.5 %  B Nat Peptide  Result Value Ref Range   Pro B Natriuretic peptide (BNP) 144.0 (H) 0.0 - 100.0 pg/mL     Your metabolic profile looks ok except for minimally low sodium.  This is not likely to cause symptoms, but we will need to monitor if we use diuretics for your swelling Your BNP is just mildly increased; this is actually reassuring that you do not have heart failure as a cause of your swelling. Typically in heart failure we see  numbers greater than 400  Blood counts look okay  You are correct that amlodipine could increase swelling.  But we might do is stop this medication, but increase your Diovan to keep your blood pressure under control.    You might also wish to use your Lasix daily for a week or so  Please let me know your thoughts, if okay with you I will call in a higher dose of Diovan and we can stop the amlodipine

## 2019-01-25 LAB — COMPREHENSIVE METABOLIC PANEL
ALT: 18 U/L (ref 0–35)
AST: 20 U/L (ref 0–37)
Albumin: 4.1 g/dL (ref 3.5–5.2)
Alkaline Phosphatase: 90 U/L (ref 39–117)
BUN: 16 mg/dL (ref 6–23)
CO2: 27 mEq/L (ref 19–32)
Calcium: 9.5 mg/dL (ref 8.4–10.5)
Chloride: 99 mEq/L (ref 96–112)
Creatinine, Ser: 0.73 mg/dL (ref 0.40–1.20)
GFR: 77.02 mL/min (ref >60.00)
Glucose, Bld: 96 mg/dL (ref 70–99)
Potassium: 4.4 mEq/L (ref 3.5–5.1)
Sodium: 133 mEq/L — ABNORMAL LOW (ref 135–145)
Total Bilirubin: 0.5 mg/dL (ref 0.2–1.2)
Total Protein: 6.8 g/dL (ref 6.0–8.3)

## 2019-01-25 LAB — CBC
HCT: 35.8 % — ABNORMAL LOW (ref 36.0–46.0)
Hemoglobin: 12.1 g/dL (ref 12.0–15.0)
MCHC: 33.7 g/dL (ref 30.0–36.0)
MCV: 88.6 fl (ref 78.0–100.0)
Platelets: 305 10*3/uL (ref 150.0–400.0)
RBC: 4.05 Mil/uL (ref 3.87–5.11)
RDW: 14.5 % (ref 11.5–15.5)
WBC: 6.9 10*3/uL (ref 4.0–10.5)

## 2019-01-25 LAB — BRAIN NATRIURETIC PEPTIDE: Pro B Natriuretic peptide (BNP): 144 pg/mL — ABNORMAL HIGH (ref 0.0–100.0)

## 2019-01-26 ENCOUNTER — Encounter: Payer: Self-pay | Admitting: Family Medicine

## 2019-01-27 ENCOUNTER — Encounter: Payer: Self-pay | Admitting: Family Medicine

## 2019-02-06 ENCOUNTER — Encounter: Payer: Self-pay | Admitting: Family Medicine

## 2019-02-06 DIAGNOSIS — I10 Essential (primary) hypertension: Secondary | ICD-10-CM

## 2019-02-07 MED ORDER — VALSARTAN 80 MG PO TABS: 240.0000 mg | ORAL_TABLET | Freq: Every day | ORAL | 3 refills | Status: DC

## 2019-02-22 ENCOUNTER — Other Ambulatory Visit (INDEPENDENT_AMBULATORY_CARE_PROVIDER_SITE_OTHER): Payer: Self-pay | Admitting: Orthopaedic Surgery

## 2019-02-22 NOTE — Telephone Encounter (Signed)
Please advise 

## 2019-02-24 ENCOUNTER — Telehealth: Payer: Self-pay | Admitting: Radiology

## 2019-02-24 ENCOUNTER — Encounter: Payer: Self-pay | Admitting: Physician Assistant

## 2019-02-24 ENCOUNTER — Other Ambulatory Visit: Payer: Self-pay

## 2019-02-24 ENCOUNTER — Ambulatory Visit (INDEPENDENT_AMBULATORY_CARE_PROVIDER_SITE_OTHER): Payer: Medicare Other | Admitting: Physician Assistant

## 2019-02-24 ENCOUNTER — Ambulatory Visit: Payer: Self-pay

## 2019-02-24 DIAGNOSIS — M25562 Pain in left knee: Secondary | ICD-10-CM

## 2019-02-24 NOTE — Progress Notes (Signed)
Office Visit Note   Patient: Angela Gibbs           Date of Birth: 08/26/1940           MRN: [id number] Visit Date: 02/24/2019              Requested by: Darreld Mclean, MD [address] [place],  [place] [address] PCP: Darreld Mclean, MD   Assessment & Plan: Visit Diagnoses:  1. Left knee pain, unspecified chronicity     Plan: We will try to obtain approval for supplemental injection left knee.  She again is unable to do steroid injections due to causing her to menstruate.  And has been told by her gynecologist that to not have steroid injections.  Therefore we did try to aspirate the knee today we obtained 2 cc of yellow blood-tinged aspirate patient tolerated well.  Recommend she use Voltaren gel on the knee up to 4 times daily.  We will see her back once the supplemental injections available.  Follow-Up Instructions: Return for Supplemental injection.   Orders:  Orders Placed This Encounter  Procedures  . XR Knee 1-2 Views Left   No orders of the defined types were placed in this encounter.     Procedures: No procedures performed   Clinical Data: No additional findings.   Subjective: Chief Complaint  Patient presents with  . Left Knee - Pain    HPI Angela Gibbs 79 year old female well-known to Dr. Trevor Mace service history of right total hip arthroplasty doing well.  She comes in today for left knee pain and swelling.  The pain is worse in the posterior aspect of the leg.  Pains became worse over the last week but it is on an ongoing for at least a month.  No known injury.  She is having pain with ambulating.  She did undergo a Doppler which is negative for DVT. Review of Systems Negative for fevers chills shortness of breath chest pain  Objective: Vital Signs: There were no vitals taken for this visit.  Physical Exam Constitutional:      Appearance: She is not ill-appearing or diaphoretic.  Pulmonary:     Effort: Pulmonary effort is  normal.  Neurological:     Mental Status: She is alert and oriented to person, place, and time.  Psychiatric:        Mood and Affect: Mood normal.        Behavior: Behavior normal.     Ortho Exam Bilateral knees good range of motion of both knees.  No abnormal warmth or erythema of either knee.  Left knee with positive edema plus minus effusion.  Tenderness along medial joint line.  No instability valgus varus stressing.  McMurray's negative. Specialty Comments:  No specialty comments available.  Imaging: XR Knee 1-2 Views Left  Result Date: 02/24/2019 2 views left knee: No acute fracture.  Tricompartmental changes with moderate medial compartmental narrowing and moderate to severe patellofemoral changes.  Lateral compartment with periarticular spurs.  No bony abnormalities otherwise.    PMFS History: Patient Active Problem List   Diagnosis Date Noted  . Restrictive lung disease 05/13/2016  . Moderate persistent asthma without complication Q000111Q  . Current use of beta blocker 04/11/2016  . Other allergic rhinitis 04/11/2016  . Gastroesophageal reflux disease without esophagitis 04/11/2016  . Essential hypertension 02/20/2016  . Abdominal pain, acute, right upper quadrant 07/29/2013   Past Medical History:  Diagnosis Date  . Allergy   .  Concussion   . Falls   . Head injury   . Herpes   . Hypertension   . Migraines   . Nerve damage   . Shingles     Family History  Problem Relation Age of Onset  . Heart disease Father   . Heart attack Father     Past Surgical History:  Procedure Laterality Date  . BRAIN SURGERY    . CRANIOTOMY FOR EXTRACRANIAL-INTRACRANIAL BYPASS    . EYE SURGERY  4.4.17   milia excision  . HIP SURGERY     intracranial surgery  . NERVE SURGERY    . ROOT CANAL    . TONSILLECTOMY     Social History   Occupational History  . Not on file  Tobacco Use  . Smoking status: Never Smoker  . Smokeless tobacco: Never Used  Substance and Sexual  Activity  . Alcohol use: Yes    Comment: social  . Drug use: No  . Sexual activity: Yes

## 2019-02-24 NOTE — Telephone Encounter (Signed)
Requests left knee supplemental  injection

## 2019-03-01 NOTE — Telephone Encounter (Signed)
Noted  

## 2019-03-04 ENCOUNTER — Encounter: Payer: Self-pay | Admitting: Family Medicine

## 2019-03-04 ENCOUNTER — Telehealth: Payer: Self-pay

## 2019-03-04 NOTE — Telephone Encounter (Signed)
Submitted VOB for Monovisc, left knee. 

## 2019-03-08 ENCOUNTER — Encounter: Payer: Self-pay | Admitting: Family Medicine

## 2019-03-08 ENCOUNTER — Telehealth: Payer: Self-pay

## 2019-03-08 NOTE — Telephone Encounter (Signed)
Called and left a VM advising patient to call back to schedule appointment with Dr. Ninfa Linden or Artis Delay for gel injection.  Approved for Monovisc, left knee. [place] Patient's secondary insurance will pick up remaining eligible expenses at 100% and cover Medicare Part B deductible. No Co-pay No PA required

## 2019-03-09 ENCOUNTER — Ambulatory Visit: Payer: Medicare Other | Attending: Internal Medicine

## 2019-03-09 DIAGNOSIS — Z23 Encounter for immunization: Secondary | ICD-10-CM | POA: Diagnosis not present

## 2019-03-09 NOTE — Progress Notes (Signed)
   Covid-19 Vaccination Clinic  Name:  Angela Gibbs    MRN: [id number] DOB: 1940-09-21  03/09/2019  [name] was observed post Covid-19 immunization for 15 minutes without incidence. She was provided with Vaccine Information Sheet and instruction to access the V-Safe system.   [name] was instructed to call 911 with any severe reactions post vaccine: Marland Kitchen Difficulty breathing  . Swelling of your face and throat  . A fast heartbeat  . A bad rash all over your body  . Dizziness and weakness    Immunizations Administered    Name Date Dose VIS Date Route   Pfizer COVID-19 Vaccine 03/09/2019 11:53 AM 0.3 mL 01/28/2019 Intramuscular   Manufacturer: [place]   Lot: [id number]   [place]: [id number]

## 2019-03-13 ENCOUNTER — Encounter: Payer: Self-pay | Admitting: Family Medicine

## 2019-03-14 NOTE — Progress Notes (Signed)
[place] [facility] [address] [place], [place] [address] [phone]  Date:  03/16/2019   Name:  Angela Gibbs   DOB:  [birth year]   MRN:  [id number]  PCP:  Darreld Mclean, MD    Chief Complaint: Leg Pain (left leg)   History of Present Illness:  Angela Gibbs is a 79 y.o. very pleasant female patient who presents with the following:  Patient with history of hypertension, allergies and asthma, GERD  Here today with concern of pain in her knee and leg-she sent me the following my chart message on 1/24  I need to see you as soon as possible.  Since my last appointment, approx 4 weeks ago, my left leg on the left side has gotten worse.  I have been to my orthopedic, Dr. Zollie Beckers, his PA saw me, xray and discovered it was osteoarthritis.   He drained my knee and told it would feel a lot better, it didn't.  It has gotten worse each day.  He prescribed a topical med. called diclofenac  sodium 1%.  It works but the intolerable side effects higher blood pressure, 30 points upper side and 10-15 points lower side.  Somedays worse.  Also a headache every morning that last all day without meds.  I have butalbital for past headaches and have taken that and nuprin (ibuprofen) .  I have completely deleted the diclofenac from my regimen thus deleting the headaches and the  meds.    My leg pain is quite severe, wakes me every night and I really need your guidance.   I saw her for this on 12/7- we did an [place] for her to rule out DVT- negative  She notes swelling of her left leg, mostly in the calf-this can be uncomfortable Gets worse as the day goes on She often wears compression stockings, did not wear them today to facilitate exam She is taking furosemide once a day and this does seem to help CMP done 01/2019  She is taking some advil for her hip pain and also fioricet for headaches as needed   It sounds like her orthopedist plans to  do a Synvisc injection She also had plain films recently per orthopedics No MRI as of yet    Patient Active Problem List   Diagnosis Date Noted  . Restrictive lung disease 05/13/2016  . Moderate persistent asthma without complication [id number]  . Current use of beta blocker 04/11/2016  . Other allergic rhinitis 04/11/2016  . Gastroesophageal reflux disease without esophagitis 04/11/2016  . Essential hypertension 02/20/2016  . Abdominal pain, acute, right upper quadrant 07/29/2013    Past Medical History:  Diagnosis Date  . Allergy   . Concussion   . Falls   . Head injury   . Herpes   . Hypertension   . Migraines   . Nerve damage   . Shingles     Past Surgical History:  Procedure Laterality Date  . BRAIN SURGERY    . CRANIOTOMY FOR EXTRACRANIAL-INTRACRANIAL BYPASS    . EYE SURGERY  4.4.17   milia excision  . HIP SURGERY     intracranial surgery  . NERVE SURGERY    . ROOT CANAL    . TONSILLECTOMY      Social History   Tobacco Use  . Smoking status: Never Smoker  . Smokeless tobacco: Never Used  Substance Use Topics  . Alcohol use: Yes    Comment:  social  . Drug use: No    Family History  Problem Relation Age of Onset  . Heart disease Father   . Heart attack Father     Allergies  Allergen Reactions  . Cephalosporins Shortness Of Breath    Breathing difficulty  . Ciprofloxacin Hives and Shortness Of Breath  . Keflex [Cephalexin] Hives  . Penicillins Hives  . Sulfacetamide Sodium Hives  . Xopenex [Levalbuterol Hcl] Anxiety and Other (See Comments)    Tremors with violent body chattering and shaking  . Pregabalin Other (See Comments)  . Aspirin Other (See Comments)    Stomach pain/burning  . Chromium Other (See Comments)    Blisters  . Erythromycin Other (See Comments)    "stomach issues"  . Flu Virus Vaccine Other (See Comments)    Numbness in legs  . Hydrochlorothiazide Other (See Comments)    Migraine  . Influenza Vaccines Other (See  Comments)    Numbness in legs  . Iodinated Diagnostic Agents     unknown unknown  . Iodine   . Lactose Other (See Comments)    "stomach issues"  . Lactose Intolerance (Gi) Other (See Comments)    "stomach issues"  . Latex   . Lisinopril Cough  . Nitrofuran Derivatives Hives, Itching and Swelling  . Petrolatum Nausea Only  . Shellfish Allergy Hives  . Sulfa Antibiotics Hives    Other reaction(s): Other (See Comments) Flank pain  . Troleandomycin Other (See Comments)    Unknown  . Zostavax [Zoster Vaccine Live] Other (See Comments)    Pt has been told by specialists not to receive vaccine d/t h/o herpes in bloodstream and shingles.  . Avelox [Moxifloxacin Hcl In Nacl] Nausea Only  . Cefdinir Nausea And Vomiting  . Eggs Or Egg-Derived Products     Per allergy testing, no known reaction. Pt states she was told she could probably eat 2-3 eggs weekly w/o issue.  . Levaquin [Levofloxacin] Hives, Palpitations and Other (See Comments)    Chest pain  . Percodan [Oxycodone-Aspirin] Nausea And Vomiting  . Prednisone Palpitations    No injections, can do the pills    Medication list has been reviewed and updated.  Current Outpatient Medications on File Prior to Visit  Medication Sig Dispense Refill  . albuterol (PROVENTIL HFA;VENTOLIN HFA) 108 (90 Base) MCG/ACT inhaler Inhale 2 puffs into the lungs every 6 (six) hours as needed for wheezing or shortness of breath. 1 Inhaler 6  . ALPRAZolam (XANAX) 0.25 MG tablet Take 1 tablet (0.25 mg total) by mouth 2 (two) times daily as needed for anxiety. 30 tablet 1  . amLODipine (NORVASC) 5 MG tablet TAKE 1 TABLET(5 MG) BY MOUTH DAILY 90 tablet 3  . Ascorbic Acid (VITAMIN C) 100 MG tablet Take 1,000 mg by mouth daily. Reported on 05/24/2015    . atenolol (TENORMIN) 25 MG tablet Take 1 tablet (25 mg total) by mouth daily. 90 tablet 3  . butalbital-acetaminophen-caffeine (FIORICET, ESGIC) 50-325-40 MG per tablet Take by mouth 2 (two) times daily as  needed for headache.    Marland Kitchen BYSTOLIC 10 MG tablet TAKE 1 TABLET BY MOUTH EVERY DAY 90 tablet 3  . Cholecalciferol (D 5000) 5000 UNITS TABS daily at 0600.    Marland Kitchen co-enzyme Q-10 30 MG capsule Take 30 mg by mouth daily.    . diclofenac Sodium (VOLTAREN) 1 % GEL APPLY 2 GRAMS EXTERNALLY TO THE AFFECTED AREA FOUR TIMES DAILY 100 g 0  . erythromycin ophthalmic ointment 1 application.     Marland Kitchen  fluticasone (FLONASE) 50 MCG/ACT nasal spray Place 2 sprays into both nostrils daily. 16 g 5  . furosemide (LASIX) 20 MG tablet TAKE 1 TABLET BY MOUTH EVERY DAY ON OCCASION AS NEEDED FOR SWELLING 90 tablet 1  . lidocaine (LIDODERM) 5 % Place 1 patch onto the skin.     Marland Kitchen omeprazole (PRILOSEC OTC) 20 MG tablet Take 30 mg by mouth daily.     . Prednicarbate 0.1 % CREA     . promethazine (PHENERGAN) 25 MG tablet Take 1 tablet (25 mg total) by mouth every 6 (six) hours as needed for nausea or vomiting. 30 tablet 0  . sodium chloride (V-R NASAL SPRAY SALINE) 0.65 % nasal spray Place 1 spray into the nose as needed. Use in both nostrils twice daily 30 mL 6  . valsartan (DIOVAN) 80 MG tablet Take 3 tablets (240 mg total) by mouth daily. 270 tablet 3   No current facility-administered medications on file prior to visit.    Review of Systems:  As per HPI- otherwise negative. She has some SOB when she wears her face mask for covid  Otherwise no chest pain or shortness of breath Physical Examination: Vitals:   03/16/19 1410  BP: 128/80  Pulse: 68  Resp: 19  Temp: (!) 95.6 F (35.3 C)  SpO2: 97%   Vitals:   03/16/19 1410  Weight: 193 lb (87.5 kg)  Height: 5' 2.5" (1.588 m)   Body mass index is 34.74 kg/m. Ideal Body Weight: Weight in (lb) to have BMI = 25: 138.6  GEN: WDWN, NAD, Non-toxic, A & O x 3, obese, looks well HEENT: Atraumatic, Normocephalic. Neck supple. No masses, No LAD. Ears and Nose: No external deformity. CV: RRR, No M/G/R. No JVD. No thrill. No extra heart sounds. PULM: CTA B, no wheezes,  crackles, rhonchi. No retractions. No resp. distress. No accessory muscle use. ABD: S, NT, ND, +BS. No rebound. No HSM. EXTR: No c/c.  She has trace to 1+ edema of the left lower extremity, from the mid shin down.  The foot is warm and well-perfused, strong pulses.  There is trace swelling on the right NEURO Normal gait.  PSYCH: Normally interactive. Conversant. Not depressed or anxious appearing.  Calm demeanor.    Assessment and Plan: Localized swelling of left lower extremity - Plan: B Nat Peptide, CANCELED: B Nat Peptide, CANCELED: B Nat Peptide  SOB (shortness of breath) - Plan: B Nat Peptide, CANCELED: B Nat Peptide, CANCELED: B Nat Peptide  Venous insufficiency - Plan: VAS [place] LOWER EXTREMITY VENOUS REFLUX   Here today with concern of swelling of her left leg.  She is undergoing a current orthopedics evaluation for knee pain, I suspect that her knee is causing her leg swelling.  We did an ultrasound recently to rule out DVT.  Compression stockings do help.  She is interested in possibly doing further evaluation her venous return, I will set her up for a venous study  Moderate medical decision making today  This visit occurred during the SARS-CoV-2 public health emergency.  Safety protocols were in place, including screening questions prior to the visit, additional usage of staff PPE, and extensive cleaning of exam room while observing appropriate contact time as indicated for disinfecting solutions.    Signed Lamar Blinks, MD

## 2019-03-15 ENCOUNTER — Other Ambulatory Visit: Payer: Self-pay

## 2019-03-15 DIAGNOSIS — I10 Essential (primary) hypertension: Secondary | ICD-10-CM

## 2019-03-15 MED ORDER — AMLODIPINE BESYLATE 5 MG PO TABS: ORAL_TABLET | ORAL | 3 refills | Status: DC

## 2019-03-16 ENCOUNTER — Encounter: Payer: Self-pay | Admitting: Family Medicine

## 2019-03-16 ENCOUNTER — Ambulatory Visit (INDEPENDENT_AMBULATORY_CARE_PROVIDER_SITE_OTHER): Payer: Medicare Other | Admitting: Family Medicine

## 2019-03-16 ENCOUNTER — Other Ambulatory Visit: Payer: Self-pay

## 2019-03-16 VITALS — BP 128/80 | HR 68 | Temp 95.6°F | Resp 19 | Ht 62.5 in | Wt 193.0 lb

## 2019-03-16 DIAGNOSIS — I872 Venous insufficiency (chronic) (peripheral): Secondary | ICD-10-CM

## 2019-03-16 DIAGNOSIS — R0602 Shortness of breath: Secondary | ICD-10-CM | POA: Diagnosis not present

## 2019-03-16 DIAGNOSIS — R2242 Localized swelling, mass and lump, left lower limb: Secondary | ICD-10-CM

## 2019-03-16 NOTE — Patient Instructions (Signed)
It was good to see you again today I think that your leg swelling is due to your knee- however it is hard to know for sure I will set you up for a venous study to check on the return circulation in your left leg  We will also check a BNP to make sure no sign of heart failure as the cause of your swelling

## 2019-03-17 ENCOUNTER — Encounter: Payer: Self-pay | Admitting: Family Medicine

## 2019-03-17 LAB — BRAIN NATRIURETIC PEPTIDE: Brain Natriuretic Peptide: 80 pg/mL (ref <100)

## 2019-03-29 ENCOUNTER — Ambulatory Visit (HOSPITAL_COMMUNITY)
Admission: RE | Admit: 2019-03-29 | Discharge: 2019-03-29 | Disposition: A | Discharge status: Home / Self Care | Payer: Medicare Other | Source: Ambulatory Visit | Attending: Family Medicine | Admitting: Family Medicine

## 2019-03-29 ENCOUNTER — Other Ambulatory Visit: Payer: Self-pay

## 2019-03-29 DIAGNOSIS — I872 Venous insufficiency (chronic) (peripheral): Secondary | ICD-10-CM | POA: Diagnosis not present

## 2019-03-30 ENCOUNTER — Ambulatory Visit: Payer: Medicare Other | Attending: Internal Medicine

## 2019-03-30 DIAGNOSIS — Z23 Encounter for immunization: Secondary | ICD-10-CM

## 2019-03-30 NOTE — Progress Notes (Signed)
   Covid-19 Vaccination Clinic  Name:  Angela Gibbs    MRN: [id number] DOB: 09-May-1940  03/30/2019  [name] was observed post Covid-19 immunization for 15 minutes without incidence. She was provided with Vaccine Information Sheet and instruction to access the V-Safe system.   [name] was instructed to call 911 with any severe reactions post vaccine: Marland Kitchen Difficulty breathing  . Swelling of your face and throat  . A fast heartbeat  . A bad rash all over your body  . Dizziness and weakness    Immunizations Administered    Name Date Dose VIS Date Route   Pfizer COVID-19 Vaccine 03/30/2019  4:41 PM 0.3 mL 01/28/2019 Intramuscular   Manufacturer: [place]   Lot: [id number]   [place]: [id number]

## 2019-03-31 ENCOUNTER — Encounter: Payer: Self-pay | Admitting: Family Medicine

## 2019-04-01 DIAGNOSIS — H02831 Dermatochalasis of right upper eyelid: Secondary | ICD-10-CM | POA: Diagnosis not present

## 2019-04-01 DIAGNOSIS — H524 Presbyopia: Secondary | ICD-10-CM | POA: Diagnosis not present

## 2019-04-01 DIAGNOSIS — H2513 Age-related nuclear cataract, bilateral: Secondary | ICD-10-CM | POA: Diagnosis not present

## 2019-04-01 DIAGNOSIS — Z8679 Personal history of other diseases of the circulatory system: Secondary | ICD-10-CM | POA: Diagnosis not present

## 2019-04-01 DIAGNOSIS — H33311 Horseshoe tear of retina without detachment, right eye: Secondary | ICD-10-CM | POA: Diagnosis not present

## 2019-04-01 DIAGNOSIS — H43813 Vitreous degeneration, bilateral: Secondary | ICD-10-CM | POA: Diagnosis not present

## 2019-04-01 DIAGNOSIS — H02834 Dermatochalasis of left upper eyelid: Secondary | ICD-10-CM | POA: Diagnosis not present

## 2019-04-01 DIAGNOSIS — H04123 Dry eye syndrome of bilateral lacrimal glands: Secondary | ICD-10-CM | POA: Diagnosis not present

## 2019-04-10 ENCOUNTER — Encounter: Payer: Self-pay | Admitting: Family Medicine

## 2019-04-16 ENCOUNTER — Encounter: Payer: Self-pay | Admitting: Family Medicine

## 2019-04-17 ENCOUNTER — Encounter: Payer: Self-pay | Admitting: Family Medicine

## 2019-04-21 NOTE — Progress Notes (Addendum)
[place] at [facility] [place], [place], [place] [address] [phone]  Date:  04/25/2019   Name:  Takyah Ciaramitaro   DOB:  June 01, 1940   MRN:  [id number]  PCP:  Darreld Mclean, MD    Chief Complaint: Leg Swelling (find new plan)   History of Present Illness:  Nashea Chumney is a 79 y.o. very pleasant female patient who presents with the following:  Here today for follow-up visit Patient with history of restrictive lung disease and asthma, hypertension Last seen by myself in January with concern of swelling in her legs, especially of the left She was also having some knee pain, is seeing orthopedics.  We have completed an ultrasound for her to rule out DVT A recent venous reflux study was also normal and negative for DVT-completed in February She notes that her legs continue to swell esp her left She is s/p right knee replacement surgery, and also more distant right hip replacement surgery  She also reports that 8-9 years she had a total hip done at [facility].  Her surgery was very complicated and she needed several blood transfusions.  She then developed swelling of her bilateral legs; she reports that she had 2 [place] studies and no DVT was found. She then had a CT scan which apparently diagnosed DVT in her legs, and she used what sounds like lovenox for 3 months.  No blood clots before or after this incident  She does feel like stopping amlodipine helped decrease her swelling somewhat She is now taking 3 total diovan = 240 mg  She notes that lasix may help a but, but it makes her urinate "all day long" She is generally taking this every other day at this time  Her socks tend to leave painful indentations on her shins, she notes her swelling is moderate in the morning and gets worse all day long  CT pelvis and LE venogram   Patient Active Problem List   Diagnosis Date Noted  . Restrictive lung disease 05/13/2016  . Moderate  persistent asthma without complication 74/09/1446  . Current use of beta blocker 04/11/2016  . Other allergic rhinitis 04/11/2016  . Gastroesophageal reflux disease without esophagitis 04/11/2016  . Essential hypertension 02/20/2016  . Abdominal pain, acute, right upper quadrant 07/29/2013    Past Medical History:  Diagnosis Date  . Allergy   . Concussion   . Falls   . Head injury   . Herpes   . Hypertension   . Migraines   . Nerve damage   . Shingles     Past Surgical History:  Procedure Laterality Date  . BRAIN SURGERY    . CRANIOTOMY FOR EXTRACRANIAL-INTRACRANIAL BYPASS    . EYE SURGERY  4.4.17   milia excision  . HIP SURGERY     intracranial surgery  . NERVE SURGERY    . ROOT CANAL    . TONSILLECTOMY      Social History   Tobacco Use  . Smoking status: Never Smoker  . Smokeless tobacco: Never Used  Substance Use Topics  . Alcohol use: Yes    Comment: social  . Drug use: No    Family History  Problem Relation Age of Onset  . Heart disease Father   . Heart attack Father     Allergies  Allergen Reactions  . Cephalosporins Shortness Of Breath    Breathing difficulty  . Ciprofloxacin Hives and Shortness Of Breath  .  Keflex [Cephalexin] Hives  . Penicillins Hives  . Sulfacetamide Sodium Hives  . Xopenex [Levalbuterol Hcl] Anxiety and Other (See Comments)    Tremors with violent body chattering and shaking  . Pregabalin Other (See Comments)  . Aspirin Other (See Comments)    Stomach pain/burning  . Chromium Other (See Comments)    Blisters  . Erythromycin Other (See Comments)    "stomach issues"  . Flu Virus Vaccine Other (See Comments)    Numbness in legs  . Hydrochlorothiazide Other (See Comments)    Migraine  . Influenza Vaccines Other (See Comments)    Numbness in legs  . Iodinated Diagnostic Agents     unknown unknown  . Iodine   . Lactose Other (See Comments)    "stomach issues"  . Lactose Intolerance (Gi) Other (See Comments)     "stomach issues"  . Latex   . Lisinopril Cough  . Nitrofuran Derivatives Hives, Itching and Swelling  . Petrolatum Nausea Only  . Shellfish Allergy Hives  . Sulfa Antibiotics Hives    Other reaction(s): Other (See Comments) Flank pain  . Troleandomycin Other (See Comments)    Unknown  . Zostavax [Zoster Vaccine Live] Other (See Comments)    Pt has been told by specialists not to receive vaccine d/t h/o herpes in bloodstream and shingles.  . Avelox [Moxifloxacin Hcl In Nacl] Nausea Only  . Cefdinir Nausea And Vomiting  . Eggs Or Egg-Derived Products     Per allergy testing, no known reaction. Pt states she was told she could probably eat 2-3 eggs weekly w/o issue.  . Levaquin [Levofloxacin] Hives, Palpitations and Other (See Comments)    Chest pain  . Percodan [Oxycodone-Aspirin] Nausea And Vomiting  . Prednisone Palpitations    No injections, can do the pills    Medication list has been reviewed and updated.  Current Outpatient Medications on File Prior to Visit  Medication Sig Dispense Refill  . albuterol (PROVENTIL HFA;VENTOLIN HFA) 108 (90 Base) MCG/ACT inhaler Inhale 2 puffs into the lungs every 6 (six) hours as needed for wheezing or shortness of breath. 1 Inhaler 6  . ALPRAZolam (XANAX) 0.25 MG tablet Take 1 tablet (0.25 mg total) by mouth 2 (two) times daily as needed for anxiety. 30 tablet 1  . Ascorbic Acid (VITAMIN C) 100 MG tablet Take 1,000 mg by mouth daily. Reported on 05/24/2015    . atenolol (TENORMIN) 25 MG tablet Take 1 tablet (25 mg total) by mouth daily. 90 tablet 3  . butalbital-acetaminophen-caffeine (FIORICET, ESGIC) 50-325-40 MG per tablet Take by mouth 2 (two) times daily as needed for headache.    Marland Kitchen BYSTOLIC 10 MG tablet TAKE 1 TABLET BY MOUTH EVERY DAY 90 tablet 3  . Cholecalciferol (D 5000) 5000 UNITS TABS daily at 0600.    Marland Kitchen co-enzyme Q-10 30 MG capsule Take 30 mg by mouth daily.    . diclofenac Sodium (VOLTAREN) 1 % GEL APPLY 2 GRAMS EXTERNALLY TO THE  AFFECTED AREA FOUR TIMES DAILY 100 g 0  . erythromycin ophthalmic ointment 1 application.     . fluticasone (FLONASE) 50 MCG/ACT nasal spray Place 2 sprays into both nostrils daily. 16 g 5  . furosemide (LASIX) 20 MG tablet TAKE 1 TABLET BY MOUTH EVERY DAY ON OCCASION AS NEEDED FOR SWELLING 90 tablet 1  . lidocaine (LIDODERM) 5 % Place 1 patch onto the skin.     Marland Kitchen omeprazole (PRILOSEC OTC) 20 MG tablet Take 30 mg by mouth daily.     Marland Kitchen  Prednicarbate 0.1 % CREA     . promethazine (PHENERGAN) 25 MG tablet Take 1 tablet (25 mg total) by mouth every 6 (six) hours as needed for nausea or vomiting. 30 tablet 0  . sodium chloride (V-R NASAL SPRAY SALINE) 0.65 % nasal spray Place 1 spray into the nose as needed. Use in both nostrils twice daily 30 mL 6  . valsartan (DIOVAN) 80 MG tablet Take 3 tablets (240 mg total) by mouth daily. 270 tablet 3   No current facility-administered medications on file prior to visit.    Review of Systems:  As per HPI- otherwise negative.   Physical Examination: Vitals:   04/25/19 1117  BP: (!) 168/78  Pulse: 71  Resp: 20  Temp: (!) 96.9 F (36.1 C)  SpO2: 99%   Vitals:   04/25/19 1117  Weight: 190 lb (86.2 kg)  Height: 5' 2.5" (1.588 m)   Body mass index is 34.2 kg/m. Ideal Body Weight: Weight in (lb) to have BMI = 25: 138.6 Blood pressure is a bit high.  She notes that she just took her blood pressure medication prior to leaving the house GEN: no acute distress.  Obese, otherwise appears well HEENT: Atraumatic, Normocephalic.  Ears and Nose: No external deformity. CV: RRR, No M/G/R. No JVD. No thrill. No extra heart sounds. PULM: CTA B, no wheezes, crackles, rhonchi. No retractions. No resp. distress. No accessory muscle use. ABD: S, NT, ND, +BS. No rebound. No HSM. EXTR: No c/c PSYCH: Normally interactive. Conversant.  She has soft pain edema both lower extremities, trace on the right and 1-2+ on the left.  Exam of the left foot as well, normal  pulses and well-perfused.  No numbness  BP Readings from Last 3 Encounters:  04/25/19 (!) 168/78  03/16/19 128/80  01/24/19 130/80    Wt Readings from Last 3 Encounters:  04/25/19 190 lb (86.2 kg)  03/16/19 193 lb (87.5 kg)  01/24/19 195 lb (88.5 kg)    Assessment and Plan: Swelling of lower extremity - Plan: D-Dimer, Quantitative, Basic metabolic panel, Sedimentation rate  Here today with persistent swelling of both lower extremities, left worse than right.  We have done an ultrasound and venous reflux study, both not helpful.  Recent BMP was also negative  I discussed her case and history with radiology.  It sounds like she may have had a CT venogram in the past, sometimes an upper thigh or pelvic clot is not well seen on ultrasound.  Radiology recommended that I order a CT of the pelvis and lower extremity venogram if necessary.  We will start with a D-dimer, if this is positive pursue CT venogram, if negative will plan accordingly The patient also notes history of eczema versus psoriasis on her elbows.  She also notes various joint pains, wonders if she might actually have psoriatic arthritis.  She does have a dermatologist.  I advised her that there is not a specific lab test to diagnose psoriatic arthritis.  We will check an ESR, but I also suggested that she have a tissue biopsy per Derm to confirm she has eczema or psoriasis.  She will plan to do so  This visit occurred during the SARS-CoV-2 public health emergency.  Safety protocols were in place, including screening questions prior to the visit, additional usage of staff PPE, and extensive cleaning of exam room while observing appropriate contact time as indicated for disinfecting solutions.    Signed Lamar Blinks, MD  Received her labs so far, message  to patient  Results for orders placed or performed in visit on 04/25/19  D-Dimer, Quantitative  Result Value Ref Range   D-Dimer, Quant 0.38 <0.50 mcg/mL FEU  Basic  metabolic panel  Result Value Ref Range   Sodium 132 (L) 135 - 145 mEq/L   Potassium 4.2 3.5 - 5.1 mEq/L   Chloride 99 96 - 112 mEq/L   CO2 29 19 - 32 mEq/L   Glucose, Bld 79 70 - 99 mg/dL   BUN 16 6 - 23 mg/dL   Creatinine, Ser 0.72 0.40 - 1.20 mg/dL   GFR 78.21 >60.00 mL/min   Calcium 9.3 8.4 - 10.5 mg/dL  Sedimentation rate  Result Value Ref Range   Sed Rate 26 0 - 30 mm/hr   Addendum 3/9, received her D-dimer which was negative  Message to patient

## 2019-04-22 ENCOUNTER — Other Ambulatory Visit: Payer: Self-pay

## 2019-04-25 ENCOUNTER — Encounter: Payer: Self-pay | Admitting: Family Medicine

## 2019-04-25 ENCOUNTER — Ambulatory Visit (INDEPENDENT_AMBULATORY_CARE_PROVIDER_SITE_OTHER): Payer: Medicare Other | Admitting: Family Medicine

## 2019-04-25 ENCOUNTER — Other Ambulatory Visit: Payer: Self-pay

## 2019-04-25 VITALS — BP 168/78 | HR 71 | Temp 96.9°F | Resp 20 | Ht 62.5 in | Wt 190.0 lb

## 2019-04-25 DIAGNOSIS — M7989 Other specified soft tissue disorders: Secondary | ICD-10-CM

## 2019-04-25 LAB — BASIC METABOLIC PANEL
BUN: 16 mg/dL (ref 6–23)
CO2: 29 mEq/L (ref 19–32)
Calcium: 9.3 mg/dL (ref 8.4–10.5)
Chloride: 99 mEq/L (ref 96–112)
Creatinine, Ser: 0.72 mg/dL (ref 0.40–1.20)
GFR: 78.21 mL/min (ref >60.00)
Glucose, Bld: 79 mg/dL (ref 70–99)
Potassium: 4.2 mEq/L (ref 3.5–5.1)
Sodium: 132 mEq/L — ABNORMAL LOW (ref 135–145)

## 2019-04-25 LAB — SEDIMENTATION RATE: Sed Rate: 26 mm/hr (ref 0–30)

## 2019-04-25 NOTE — Patient Instructions (Addendum)
We will get a D dimer today- if this is negative then a clot is very unlikely.  If it is positive let's plan to do a CT venogram for you.  I will also get a lab to look for any abnormal inflammation

## 2019-04-26 ENCOUNTER — Encounter: Payer: Self-pay | Admitting: Family Medicine

## 2019-04-26 LAB — D-DIMER, QUANTITATIVE: D-Dimer, Quant: 0.38 mcg/mL FEU (ref <0.50)

## 2019-04-27 DIAGNOSIS — M545 Low back pain: Secondary | ICD-10-CM | POA: Diagnosis not present

## 2019-04-27 DIAGNOSIS — Z5181 Encounter for therapeutic drug level monitoring: Secondary | ICD-10-CM | POA: Diagnosis not present

## 2019-04-27 DIAGNOSIS — M2559 Pain in other specified joint: Secondary | ICD-10-CM | POA: Diagnosis not present

## 2019-04-27 DIAGNOSIS — Z79899 Other long term (current) drug therapy: Secondary | ICD-10-CM | POA: Diagnosis not present

## 2019-04-27 DIAGNOSIS — G894 Chronic pain syndrome: Secondary | ICD-10-CM | POA: Diagnosis not present

## 2019-04-27 DIAGNOSIS — G4489 Other headache syndrome: Secondary | ICD-10-CM | POA: Diagnosis not present

## 2019-05-03 DIAGNOSIS — L2089 Other atopic dermatitis: Secondary | ICD-10-CM | POA: Diagnosis not present

## 2019-05-05 ENCOUNTER — Encounter: Payer: Self-pay | Admitting: Family Medicine

## 2019-05-05 DIAGNOSIS — R21 Rash and other nonspecific skin eruption: Secondary | ICD-10-CM

## 2019-05-05 DIAGNOSIS — M7989 Other specified soft tissue disorders: Secondary | ICD-10-CM

## 2019-05-18 ENCOUNTER — Encounter: Payer: Self-pay | Admitting: Family Medicine

## 2019-05-18 ENCOUNTER — Encounter (HOSPITAL_BASED_OUTPATIENT_CLINIC_OR_DEPARTMENT_OTHER): Payer: Self-pay | Admitting: *Deleted

## 2019-05-18 ENCOUNTER — Observation Stay (HOSPITAL_BASED_OUTPATIENT_CLINIC_OR_DEPARTMENT_OTHER)
Admission: EM | Admit: 2019-05-18 | Discharge: 2019-05-18 | Discharge status: Against Medical Advice | Payer: Medicare Other | Attending: Internal Medicine | Admitting: Internal Medicine

## 2019-05-18 ENCOUNTER — Emergency Department (HOSPITAL_BASED_OUTPATIENT_CLINIC_OR_DEPARTMENT_OTHER): Payer: Medicare Other

## 2019-05-18 ENCOUNTER — Other Ambulatory Visit: Payer: Self-pay

## 2019-05-18 DIAGNOSIS — Z885 Allergy status to narcotic agent status: Secondary | ICD-10-CM | POA: Insufficient documentation

## 2019-05-18 DIAGNOSIS — Z91040 Latex allergy status: Secondary | ICD-10-CM | POA: Insufficient documentation

## 2019-05-18 DIAGNOSIS — G43909 Migraine, unspecified, not intractable, without status migrainosus: Secondary | ICD-10-CM | POA: Insufficient documentation

## 2019-05-18 DIAGNOSIS — Z880 Allergy status to penicillin: Secondary | ICD-10-CM | POA: Insufficient documentation

## 2019-05-18 DIAGNOSIS — F419 Anxiety disorder, unspecified: Secondary | ICD-10-CM | POA: Insufficient documentation

## 2019-05-18 DIAGNOSIS — I6782 Cerebral ischemia: Secondary | ICD-10-CM | POA: Diagnosis not present

## 2019-05-18 DIAGNOSIS — Z887 Allergy status to serum and vaccine status: Secondary | ICD-10-CM | POA: Diagnosis not present

## 2019-05-18 DIAGNOSIS — Z888 Allergy status to other drugs, medicaments and biological substances status: Secondary | ICD-10-CM | POA: Diagnosis not present

## 2019-05-18 DIAGNOSIS — G459 Transient cerebral ischemic attack, unspecified: Secondary | ICD-10-CM

## 2019-05-18 DIAGNOSIS — Z882 Allergy status to sulfonamides status: Secondary | ICD-10-CM | POA: Insufficient documentation

## 2019-05-18 DIAGNOSIS — R4789 Other speech disturbances: Secondary | ICD-10-CM | POA: Diagnosis not present

## 2019-05-18 DIAGNOSIS — Z886 Allergy status to analgesic agent status: Secondary | ICD-10-CM | POA: Insufficient documentation

## 2019-05-18 DIAGNOSIS — Z91012 Allergy to eggs: Secondary | ICD-10-CM | POA: Diagnosis not present

## 2019-05-18 DIAGNOSIS — Z8249 Family history of ischemic heart disease and other diseases of the circulatory system: Secondary | ICD-10-CM | POA: Insufficient documentation

## 2019-05-18 DIAGNOSIS — R4701 Aphasia: Secondary | ICD-10-CM | POA: Diagnosis present

## 2019-05-18 DIAGNOSIS — I10 Essential (primary) hypertension: Secondary | ICD-10-CM | POA: Insufficient documentation

## 2019-05-18 DIAGNOSIS — Z881 Allergy status to other antibiotic agents status: Secondary | ICD-10-CM | POA: Diagnosis not present

## 2019-05-18 HISTORY — DX: Transient cerebral ischemic attack, unspecified: G45.9

## 2019-05-18 LAB — CBC WITH DIFFERENTIAL/PLATELET
Abs Immature Granulocytes: 0.02 10*3/uL (ref 0.00–0.07)
Basophils Absolute: 0 10*3/uL (ref 0.0–0.1)
Basophils Relative: 1 %
Eosinophils Absolute: 0.2 10*3/uL (ref 0.0–0.5)
Eosinophils Relative: 3 %
HCT: 35.4 % — ABNORMAL LOW (ref 36.0–46.0)
Hemoglobin: 11.8 g/dL — ABNORMAL LOW (ref 12.0–15.0)
Immature Granulocytes: 0 %
Lymphocytes Relative: 21 %
Lymphs Abs: 1.3 10*3/uL (ref 0.7–4.0)
MCH: 29.7 pg (ref 26.0–34.0)
MCHC: 33.3 g/dL (ref 30.0–36.0)
MCV: 89.2 fL (ref 80.0–100.0)
Monocytes Absolute: 0.6 10*3/uL (ref 0.1–1.0)
Monocytes Relative: 10 %
Neutro Abs: 4.2 10*3/uL (ref 1.7–7.7)
Neutrophils Relative %: 65 %
Platelets: 226 10*3/uL (ref 150–400)
RBC: 3.97 MIL/uL (ref 3.87–5.11)
RDW: 12.9 % (ref 11.5–15.5)
WBC: 6.3 10*3/uL (ref 4.0–10.5)
nRBC: 0 % (ref 0.0–0.2)

## 2019-05-18 LAB — BASIC METABOLIC PANEL
Anion gap: 9 (ref 5–15)
BUN: 16 mg/dL (ref 8–23)
CO2: 24 mmol/L (ref 22–32)
Calcium: 8.8 mg/dL — ABNORMAL LOW (ref 8.9–10.3)
Chloride: 95 mmol/L — ABNORMAL LOW (ref 98–111)
Creatinine, Ser: 0.65 mg/dL (ref 0.44–1.00)
GFR calc Af Amer: >60 mL/min (ref >60)
GFR calc non Af Amer: >60 mL/min (ref >60)
Glucose, Bld: 104 mg/dL — ABNORMAL HIGH (ref 70–99)
Potassium: 4.2 mmol/L (ref 3.5–5.1)
Sodium: 128 mmol/L — ABNORMAL LOW (ref 135–145)

## 2019-05-18 MED ORDER — CLOPIDOGREL BISULFATE 75 MG PO TABS
75.0000 mg | ORAL_TABLET | Freq: Once | ORAL | Status: DC
  Filled 2019-05-18: qty 1

## 2019-05-18 NOTE — ED Provider Notes (Signed)
[facility] Provider Note   CSN: [id number] Arrival date & time: 05/18/19  1924     History Chief Complaint  Patient presents with  . Aphasia    Ang[facility] is a 79 y.o. female.  HPI   78yf with expressive aphasia. Around 6pm she was trying to read something out loud to her husband but what she was saying did not make sense. She recognized the written material and "it made sense in my head" but when she spoke it was garbled. This lasted about 5 minutes and then resolved. No further symptoms since then.   Past Medical History:  Diagnosis Date  . Allergy   . Concussion   . Falls   . Head injury   . Herpes   . Hypertension   . Migraines   . Nerve damage   . Shingles     Patient Active Problem List   Diagnosis Date Noted  . TIA (transient ischemic attack) 05/18/2019  . Restrictive lung disease 05/13/2016  . Moderate persistent asthma without complication [id number]  . Current use of beta blocker 04/11/2016  . Other allergic rhinitis 04/11/2016  . Gastroesophageal reflux disease without esophagitis 04/11/2016  . Essential hypertension 02/20/2016  . Abdominal pain, acute, right upper quadrant 07/29/2013    Past Surgical History:  Procedure Laterality Date  . BRAIN SURGERY    . CRANIOTOMY FOR EXTRACRANIAL-INTRACRANIAL BYPASS    . EYE SURGERY  4.4.17   milia excision  . HIP SURGERY     intracranial surgery  . NERVE SURGERY    . ROOT CANAL    . TONSILLECTOMY       OB History   No obstetric history on file.     Family History  Problem Relation Age of Onset  . Heart disease Father   . Heart attack Father     Social History   Tobacco Use  . Smoking status: Never Smoker  . Smokeless tobacco: Never Used  Substance Use Topics  . Alcohol use: Yes    Comment: social  . Drug use: No    Home Medications Prior to Admission medications   Medication Sig Start Date End Date Taking? Authorizing Provider  albuterol (PROVENTIL  HFA;VENTOLIN HFA) 108 (90 Base) MCG/ACT inhaler Inhale 2 puffs into the lungs every 6 (six) hours as needed for wheezing or shortness of breath. 08/07/17   Rigoberto Noel, MD  ALPRAZolam Duanne Moron) 0.25 MG tablet Take 1 tablet (0.25 mg total) by mouth 2 (two) times daily as needed for anxiety. 07/19/18   Copland, Gay Filler, MD  Ascorbic Acid (VITAMIN C) 100 MG tablet Take 1,000 mg by mouth daily. Reported on 05/24/2015    [provider]  atenolol (TENORMIN) 25 MG tablet Take 1 tablet (25 mg total) by mouth daily. 05/03/18   Copland, Gay Filler, MD  butalbital-acetaminophen-caffeine (FIORICET, ESGIC) 50-325-40 MG per tablet Take by mouth 2 (two) times daily as needed for headache.    [provider]  BYSTOLIC 10 MG tablet TAKE 1 TABLET BY MOUTH EVERY DAY 10/07/18   Copland, Gay Filler, MD  Cholecalciferol (D 5000) 5000 UNITS TABS daily at 0600.    [provider]  co-enzyme Q-10 30 MG capsule Take 30 mg by mouth daily.    [provider]  diclofenac Sodium (VOLTAREN) 1 % GEL APPLY 2 GRAMS EXTERNALLY TO THE AFFECTED AREA FOUR TIMES DAILY 02/22/19   Mcarthur Rossetti, MD  erythromycin ophthalmic ointment 1 application.  05/22/15  [provider]  fluticasone (FLONASE) 50 MCG/ACT nasal spray Place 2 sprays into both nostrils daily. 04/11/16   Charlies Silvers, MD  furosemide (LASIX) 20 MG tablet TAKE 1 TABLET BY MOUTH EVERY DAY ON OCCASION AS NEEDED FOR SWELLING 07/20/18   Copland, Gay Filler, MD  lidocaine (LIDODERM) 5 % Place 1 patch onto the skin.     [provider]  omeprazole (PRILOSEC OTC) 20 MG tablet Take 30 mg by mouth daily.  12/10/10   [provider]  Prednicarbate 0.1 % CREA  03/15/12   [provider]  promethazine (PHENERGAN) 25 MG tablet Take 1 tablet (25 mg total) by mouth every 6 (six) hours as needed for nausea or vomiting. 04/16/13   Pollina, Gwenyth Allegra, MD  sodium chloride (V-R NASAL SPRAY SALINE) 0.65 % nasal spray Place  1 spray into the nose as needed. Use in both nostrils twice daily 06/11/16   Copland, Gay Filler, MD  valsartan (DIOVAN) 80 MG tablet Take 3 tablets (240 mg total) by mouth daily. 02/07/19   Copland, Gay Filler, MD    Allergies    Cephalosporins, Ciprofloxacin, Keflex [cephalexin], Penicillins, Sulfacetamide sodium, Xopenex [levalbuterol hcl], Pregabalin, Aspirin, Chromium, Erythromycin, Flu virus vaccine, Hydrochlorothiazide, Influenza vaccines, Iodinated diagnostic agents, Iodine, Lactose, Lactose intolerance (gi), Latex, Lisinopril, Nitrofuran derivatives, Petrolatum, Shellfish allergy, Sulfa antibiotics, Troleandomycin, Zostavax [zoster vaccine live], Avelox [moxifloxacin hcl in nacl], Cefdinir, Eggs or egg-derived products, Levaquin [levofloxacin], Percodan [oxycodone-aspirin], and Prednisone  Review of Systems   Review of Systems All systems reviewed and negative, other than as noted in HPI.  Physical Exam Updated Vital Signs BP (!) 159/77   Pulse 62   Temp 98.1 F (36.7 C) (Oral)   Resp 17   Ht 5' 2.5" (1.588 m)   Wt 83.9 kg   SpO2 98%   BMI 33.30 kg/m   Physical Exam Vitals and nursing note reviewed.  Constitutional:      General: She is not in acute distress.    Appearance: She is well-developed.  HENT:     Head: Normocephalic and atraumatic.  Eyes:     General:        Right eye: No discharge.        Left eye: No discharge.     Conjunctiva/sclera: Conjunctivae normal.  Cardiovascular:     Rate and Rhythm: Normal rate and regular rhythm.     Heart sounds: Normal heart sounds. No murmur. No friction rub. No gallop.   Pulmonary:     Effort: Pulmonary effort is normal. No respiratory distress.     Breath sounds: Normal breath sounds.  Abdominal:     General: There is no distension.     Palpations: Abdomen is soft.     Tenderness: There is no abdominal tenderness.  Musculoskeletal:        General: No tenderness.     Cervical back: Neck supple.  Skin:    General:  Skin is warm and dry.  Neurological:     General: No focal deficit present.     Mental Status: She is alert and oriented to person, place, and time. Mental status is at baseline.     Cranial Nerves: No cranial nerve deficit.     Sensory: No sensory deficit.     Motor: No weakness.     Coordination: Coordination normal.  Psychiatric:        Behavior: Behavior normal.        Thought Content: Thought content normal.  ED Results / Procedures / Treatments   Labs (all labs ordered are listed, but only abnormal results are displayed) Labs Reviewed  CBC WITH DIFFERENTIAL/PLATELET - Abnormal; Notable for the following components:      Result Value   Hemoglobin 11.8 (*)    HCT 35.4 (*)    All other components within normal limits  BASIC METABOLIC PANEL - Abnormal; Notable for the following components:   Sodium 128 (*)    Chloride 95 (*)    Glucose, Bld 104 (*)    Calcium 8.8 (*)    All other components within normal limits    EKG EKG Interpretation  Date/Time:  Wednesday May 18 2019 19:57:01 EDT Ventricular Rate:  63 PR Interval:    QRS Duration: 87 QT Interval:  395 QTC Calculation: 405 R Axis:   70 Text Interpretation: Sinus rhythm Minimal ST depression, anterolateral leads Confirmed by Virgel Manifold [phone]) on 05/18/2019 8:31:55 PM   Radiology CT Head Wo Contrast  Result Date: 05/18/2019 CLINICAL DATA:  Speech changes EXAM: CT HEAD WITHOUT CONTRAST TECHNIQUE: Contiguous axial images were obtained from the base of the skull through the vertex without intravenous contrast. COMPARISON:  None. FINDINGS: Brain: There is no mass, hemorrhage or extra-axial collection. The size and configuration of the ventricles and extra-axial CSF spaces are normal. There is hypoattenuation of the Calef matter, most commonly indicating chronic small vessel disease. Vascular: Atherosclerotic calcification of the internal carotid arteries at the skull base. No abnormal hyperdensity of the major  intracranial arteries or dural venous sinuses. Skull: Remote right occipital craniectomy. Sinuses/Orbits: No fluid levels or advanced mucosal thickening of the visualized paranasal sinuses. No mastoid or middle ear effusion. The orbits are normal. IMPRESSION: Chronic small vessel disease without acute intracranial abnormality. Electronically Signed   By: Ulyses Jarred M.D.   On: 05/18/2019 20:28    Procedures Procedures (including critical care time)  Medications Ordered in ED Medications  clopidogrel (PLAVIX) tablet 75 mg (75 mg Oral Refused 05/18/19 2041)    ED Course  I have reviewed the triage vital signs and the nursing notes.  Pertinent labs & imaging results that were available during my care of the patient were reviewed by me and considered in my medical decision making (see chart for details).    MDM Rules/Calculators/A&P                      78yF with what sounds like transient expressive aphasia. Recommended admit for work-up of TIA. She is declining. She understands the benefit of hospitalization and that she is going against the standard of care. She is also intolerant of aspirin and declining plavix. Discussed role of antiplatelet agents in acute setting and secondary stroke prevention. Further recommendations for possible alternatives beyond me and she needs to discuss further with PCP or neurology. She needs to follow-up her PCP as soon as she can. Discussion had in presence of husband and questions answered to best of my ability.   Final Clinical Impression(s) / ED Diagnoses Final diagnoses:  TIA (transient ischemic attack)    Rx / DC Orders ED Discharge Orders    None       Virgel Manifold, MD 05/18/19 2121

## 2019-05-18 NOTE — ED Notes (Signed)
Notified [facility] to advise that pt will not be admitted spoke to [facility]

## 2019-05-18 NOTE — Plan of Care (Signed)
79 yo F being sent over from [facility] for TIA work up, expressive aphasia, now resolved.  Tele obs.

## 2019-05-18 NOTE — ED Notes (Signed)
ED Provider at bedside. 

## 2019-05-18 NOTE — ED Notes (Signed)
Pt returned from CT. Neuro consult in progress.

## 2019-05-18 NOTE — ED Triage Notes (Signed)
Pt reports that 1-2 hours ago, she was talking with her husband.  States that she was reading the newspaper, and for approximately 5 minutes was unable to read aloud. States that she remembers not being able to read-states that when she was reading aloud to her husband it was slurred.  During that time, pt was able to talk regularly with her husband without slurring but was unable to read. Pt A/O at this time, no neuro deficits at this time. Ambulatory.

## 2019-05-19 MED ORDER — CLOPIDOGREL BISULFATE 75 MG PO TABS: 75.0000 mg | ORAL_TABLET | Freq: Every day | ORAL | 3 refills | Status: DC

## 2019-05-19 NOTE — Consult Note (Signed)
Admission H&P    Chief Complaint: speech problems HPI: [facility] is an 79 y.o. female who was with her husband tonight and she had difficulty expressing herself where she could not find the words that she wanted to say for about 3 minutes.  she has a history of hypertension.  She feels that her symptoms are completely resolved at this point.  She is on no antithrombotic agents at home. Date last known well: 05/18/2019 Time last known well: 18:00 tPA Given: no MRankin:0 Syndrome not suggestive of large vessel occlusion.   Past Medical History:  Diagnosis Date  . Allergy   . Concussion   . Falls   . Head injury   . Herpes   . Hypertension   . Migraines   . Nerve damage   . Shingles     Past Surgical History:  Procedure Laterality Date  . BRAIN SURGERY    . CRANIOTOMY FOR EXTRACRANIAL-INTRACRANIAL BYPASS    . EYE SURGERY  4.4.17   milia excision  . HIP SURGERY     intracranial surgery  . NERVE SURGERY    . ROOT CANAL    . TONSILLECTOMY      Family History  Problem Relation Age of Onset  . Heart disease Father   . Heart attack Father    Social History:  reports that she has never smoked. She has never used smokeless tobacco. She reports current alcohol use. She reports that she does not use drugs.  Allergies:  Allergies  Allergen Reactions  . Cephalosporins Shortness Of Breath    Breathing difficulty  . Ciprofloxacin Hives and Shortness Of Breath  . Keflex [Cephalexin] Hives  . Penicillins Hives  . Sulfacetamide Sodium Hives  . Xopenex [Levalbuterol Hcl] Anxiety and Other (See Comments)    Tremors with violent body chattering and shaking  . Pregabalin Other (See Comments)  . Aspirin Other (See Comments)    Stomach pain/burning  . Chromium Other (See Comments)    Blisters  . Erythromycin Other (See Comments)    "stomach issues"  . Flu Virus Vaccine Other (See Comments)    Numbness in legs  . Hydrochlorothiazide Other (See Comments)    Migraine  .  Influenza Vaccines Other (See Comments)    Numbness in legs  . Iodinated Diagnostic Agents     unknown unknown  . Iodine   . Lactose Other (See Comments)    "stomach issues"  . Lactose Intolerance (Gi) Other (See Comments)    "stomach issues"  . Latex   . Lisinopril Cough  . Nitrofuran Derivatives Hives, Itching and Swelling  . Petrolatum Nausea Only  . Shellfish Allergy Hives  . Sulfa Antibiotics Hives    Other reaction(s): Other (See Comments) Flank pain  . Troleandomycin Other (See Comments)    Unknown  . Zostavax [Zoster Vaccine Live] Other (See Comments)    Pt has been told by specialists not to receive vaccine d/t h/o herpes in bloodstream and shingles.  . Avelox [Moxifloxacin Hcl In Nacl] Nausea Only  . Cefdinir Nausea And Vomiting  . Eggs Or Egg-Derived Products     Per allergy testing, no known reaction. Pt states she was told she could probably eat 2-3 eggs weekly w/o issue.  . Levaquin [Levofloxacin] Hives, Palpitations and Other (See Comments)    Chest pain  . Percodan [Oxycodone-Aspirin] Nausea And Vomiting  . Prednisone Palpitations    No injections, can do the pills    (Not in a hospital admission)  Physical Examination: Blood pressure (!) 159/77, pulse 62, temperature 98.1 F (36.7 C), temperature source Oral, resp. rate 17, height 5' 2.5" (1.588 m), weight 83.9 kg, SpO2 98 %.  NIHSS exam:  0  Results for orders placed or performed during the hospital encounter of 05/18/19 (from the past 48 hour(s))  CBC with Differential     Status: Abnormal   Collection Time: 05/18/19  7:59 PM  Result Value Ref Range   WBC 6.3 4.0 - 10.5 K/uL   RBC 3.97 3.87 - 5.11 MIL/uL   Hemoglobin 11.8 (L) 12.0 - 15.0 g/dL   HCT 35.4 (L) 36.0 - 46.0 %   MCV 89.2 80.0 - 100.0 fL   MCH 29.7 26.0 - 34.0 pg   MCHC 33.3 30.0 - 36.0 g/dL   RDW 12.9 11.5 - 15.5 %   Platelets 226 150 - 400 K/uL   nRBC 0.0 0.0 - 0.2 %   Neutrophils Relative % 65 %   Neutro Abs 4.2 1.7 - 7.7  K/uL   Lymphocytes Relative 21 %   Lymphs Abs 1.3 0.7 - 4.0 K/uL   Monocytes Relative 10 %   Monocytes Absolute 0.6 0.1 - 1.0 K/uL   Eosinophils Relative 3 %   Eosinophils Absolute 0.2 0.0 - 0.5 K/uL   Basophils Relative 1 %   Basophils Absolute 0.0 0.0 - 0.1 K/uL   Immature Granulocytes 0 %   Abs Immature Granulocytes 0.02 0.00 - 0.07 K/uL    Comment: Performed at [facility], [address] [place], [place] [id number]  Basic metabolic panel     Status: Abnormal   Collection Time: 05/18/19  7:59 PM  Result Value Ref Range   Sodium 128 (L) 135 - 145 mmol/L   Potassium 4.2 3.5 - 5.1 mmol/L   Chloride 95 (L) 98 - 111 mmol/L   CO2 24 22 - 32 mmol/L   Glucose, Bld 104 (H) 70 - 99 mg/dL    Comment: Glucose reference range applies only to samples taken after fasting for at least 8 hours.   BUN 16 8 - 23 mg/dL   Creatinine, Ser 0.65 0.44 - 1.00 mg/dL   Calcium 8.8 (L) 8.9 - 10.3 mg/dL   GFR calc non Af Amer >60 >60 mL/min   GFR calc Af Amer >60 >60 mL/min   Anion gap 9 5 - 15    Comment: Performed at [facility], [address] [place], [place] [address]   CT Head Wo Contrast  Result Date: 05/18/2019 CLINICAL DATA:  Speech changes EXAM: CT HEAD WITHOUT CONTRAST TECHNIQUE: Contiguous axial images were obtained from the base of the skull through the vertex without intravenous contrast. COMPARISON:  None. FINDINGS: Brain: There is no mass, hemorrhage or extra-axial collection. The size and configuration of the ventricles and extra-axial CSF spaces are normal. There is hypoattenuation of the Takagi matter, most commonly indicating chronic small vessel disease. Vascular: Atherosclerotic calcification of the internal carotid arteries at the skull base. No abnormal hyperdensity of the major intracranial arteries or dural venous sinuses. Skull: Remote right occipital craniectomy. Sinuses/Orbits: No fluid levels or advanced mucosal thickening of the visualized paranasal  sinuses. No mastoid or middle ear effusion. The orbits are normal. IMPRESSION: Chronic small vessel disease without acute intracranial abnormality. Electronically Signed   By: Ulyses Jarred M.D.   On: 05/18/2019 20:28   Assessment: 79 y.o. female with a recent TIA.  I recommend inpatient evaluation for causes of TIA and  starting on Plavix since she has an aspirin allergy. Stroke Risk Factors -hypertension Plan: 1. HgbA1c, fasting lipid panel 2. MRI, MRA  of the brain without contrast 3. PT consult, OT consult, Speech consult 4. Echocardiogram 5. Carotid dopplers 6. Prophylactic therapy-aspirin 81 mg daily 7. Risk factor modification 7. Telemetry monitoring   Stroke education, treatment, and current plan discussed with patient/significant other: yes  Rosaria Ferries 05/19/2019, 12:54 AM

## 2019-05-20 NOTE — Addendum Note (Signed)
Addended by: Lamar Blinks C on: 05/20/2019 08:10 AM   Modules accepted: Orders

## 2019-05-26 DIAGNOSIS — M064 Inflammatory polyarthropathy: Secondary | ICD-10-CM | POA: Diagnosis not present

## 2019-05-26 DIAGNOSIS — R600 Localized edema: Secondary | ICD-10-CM | POA: Diagnosis not present

## 2019-05-26 DIAGNOSIS — M2550 Pain in unspecified joint: Secondary | ICD-10-CM | POA: Diagnosis not present

## 2019-05-26 DIAGNOSIS — R5383 Other fatigue: Secondary | ICD-10-CM | POA: Diagnosis not present

## 2019-05-26 DIAGNOSIS — E669 Obesity, unspecified: Secondary | ICD-10-CM | POA: Diagnosis not present

## 2019-05-26 DIAGNOSIS — L409 Psoriasis, unspecified: Secondary | ICD-10-CM | POA: Diagnosis not present

## 2019-05-26 DIAGNOSIS — L309 Dermatitis, unspecified: Secondary | ICD-10-CM | POA: Diagnosis not present

## 2019-05-26 DIAGNOSIS — Z6833 Body mass index (BMI) 33.0-33.9, adult: Secondary | ICD-10-CM | POA: Diagnosis not present

## 2019-05-26 DIAGNOSIS — M150 Primary generalized (osteo)arthritis: Secondary | ICD-10-CM | POA: Diagnosis not present

## 2019-05-26 DIAGNOSIS — M3500 Sicca syndrome, unspecified: Secondary | ICD-10-CM | POA: Diagnosis not present

## 2019-05-26 LAB — BASIC METABOLIC PANEL
BUN: 16 (ref 4–21)
CO2: 23 — AB (ref 13–22)
Chloride: 94 — AB (ref 99–108)
Creatinine: 0.8 (ref 0.5–1.1)
Glucose: 91
Potassium: 4.7 (ref 3.4–5.3)
Sodium: 129 — AB (ref 137–147)

## 2019-05-26 LAB — HEPATIC FUNCTION PANEL
ALT: 23 (ref 7–35)
AST: 27 (ref 13–35)
Alkaline Phosphatase: 104 (ref 25–125)
Bilirubin, Total: 0.3

## 2019-05-26 LAB — CBC AND DIFFERENTIAL
HCT: 36 (ref 36–46)
Hemoglobin: 12.2 (ref 12.0–16.0)
Platelets: 270 (ref 150–399)
WBC: 5.4

## 2019-05-26 LAB — TSH: TSH: 2.93 (ref 0.41–5.90)

## 2019-05-26 LAB — COMPREHENSIVE METABOLIC PANEL
Calcium: 9.6 (ref 8.7–10.7)
GFR calc non Af Amer: 73

## 2019-06-13 DIAGNOSIS — R479 Unspecified speech disturbances: Secondary | ICD-10-CM | POA: Diagnosis not present

## 2019-06-13 DIAGNOSIS — G459 Transient cerebral ischemic attack, unspecified: Secondary | ICD-10-CM | POA: Diagnosis not present

## 2019-06-14 ENCOUNTER — Encounter: Payer: Self-pay | Admitting: Family Medicine

## 2019-06-15 DIAGNOSIS — R479 Unspecified speech disturbances: Secondary | ICD-10-CM | POA: Diagnosis not present

## 2019-06-15 DIAGNOSIS — G459 Transient cerebral ischemic attack, unspecified: Secondary | ICD-10-CM | POA: Diagnosis not present

## 2019-06-16 DIAGNOSIS — L723 Sebaceous cyst: Secondary | ICD-10-CM | POA: Diagnosis not present

## 2019-06-21 DIAGNOSIS — L4059 Other psoriatic arthropathy: Secondary | ICD-10-CM | POA: Diagnosis not present

## 2019-06-21 DIAGNOSIS — Z6833 Body mass index (BMI) 33.0-33.9, adult: Secondary | ICD-10-CM | POA: Diagnosis not present

## 2019-06-21 DIAGNOSIS — E669 Obesity, unspecified: Secondary | ICD-10-CM | POA: Diagnosis not present

## 2019-06-21 DIAGNOSIS — M150 Primary generalized (osteo)arthritis: Secondary | ICD-10-CM | POA: Diagnosis not present

## 2019-06-21 DIAGNOSIS — L409 Psoriasis, unspecified: Secondary | ICD-10-CM | POA: Diagnosis not present

## 2019-06-21 DIAGNOSIS — R600 Localized edema: Secondary | ICD-10-CM | POA: Diagnosis not present

## 2019-06-21 DIAGNOSIS — M2550 Pain in unspecified joint: Secondary | ICD-10-CM | POA: Diagnosis not present

## 2019-06-28 DIAGNOSIS — R9401 Abnormal electroencephalogram [EEG]: Secondary | ICD-10-CM | POA: Diagnosis not present

## 2019-06-29 DIAGNOSIS — L4059 Other psoriatic arthropathy: Secondary | ICD-10-CM | POA: Diagnosis not present

## 2019-07-01 DIAGNOSIS — G459 Transient cerebral ischemic attack, unspecified: Secondary | ICD-10-CM | POA: Diagnosis not present

## 2019-07-01 DIAGNOSIS — R479 Unspecified speech disturbances: Secondary | ICD-10-CM | POA: Diagnosis not present

## 2019-07-27 DIAGNOSIS — L4059 Other psoriatic arthropathy: Secondary | ICD-10-CM | POA: Diagnosis not present

## 2019-08-24 ENCOUNTER — Encounter: Payer: Self-pay | Admitting: Family Medicine

## 2019-08-24 DIAGNOSIS — I10 Essential (primary) hypertension: Secondary | ICD-10-CM

## 2019-08-25 MED ORDER — VALSARTAN 160 MG PO TABS: 240.0000 mg | ORAL_TABLET | Freq: Every day | ORAL | 3 refills | Status: DC

## 2019-08-30 DIAGNOSIS — M2559 Pain in other specified joint: Secondary | ICD-10-CM | POA: Diagnosis not present

## 2019-08-30 DIAGNOSIS — M62838 Other muscle spasm: Secondary | ICD-10-CM | POA: Diagnosis not present

## 2019-08-30 DIAGNOSIS — M545 Low back pain: Secondary | ICD-10-CM | POA: Diagnosis not present

## 2019-08-30 DIAGNOSIS — B0229 Other postherpetic nervous system involvement: Secondary | ICD-10-CM | POA: Diagnosis not present

## 2019-08-31 ENCOUNTER — Ambulatory Visit (INDEPENDENT_AMBULATORY_CARE_PROVIDER_SITE_OTHER): Payer: Medicare Other | Admitting: Internal Medicine

## 2019-08-31 ENCOUNTER — Encounter: Payer: Self-pay | Admitting: Internal Medicine

## 2019-08-31 ENCOUNTER — Telehealth: Payer: Self-pay

## 2019-08-31 ENCOUNTER — Other Ambulatory Visit: Payer: Self-pay

## 2019-08-31 VITALS — BP 127/75 | HR 54 | Temp 97.6°F | Resp 22 | Ht 62.5 in | Wt 193.5 lb

## 2019-08-31 DIAGNOSIS — F329 Major depressive disorder, single episode, unspecified: Secondary | ICD-10-CM | POA: Diagnosis not present

## 2019-08-31 MED ORDER — FLUOXETINE HCL 20 MG PO TABS: ORAL_TABLET | ORAL | 1 refills | Status: DC

## 2019-08-31 NOTE — Telephone Encounter (Signed)
PA approved.   KGOVPC:[id number];LYHTMB:[id number];Review Type:Prior Auth;Coverage Start Date:08/01/2019;Coverage End Date:08/30/2020;

## 2019-08-31 NOTE — Patient Instructions (Addendum)
Happy Birthday!   Please reach out to your counselor  Prozac 20 mg tablets: Start with half tablet once a day for 10 days Then increase to 1 tablet daily for 10 days Then take 1.5 tablets every day.    GO TO THE FRONT DESK, PLEASE SCHEDULE YOUR APPOINTMENTS Come back for a checkup in 6 weeks

## 2019-08-31 NOTE — Progress Notes (Signed)
Pre visit review using our clinic review tool, if applicable. No additional management support is needed unless otherwise documented below in the visit note. 

## 2019-08-31 NOTE — Telephone Encounter (Signed)
PA initiated via Covermymeds; KEY: [id number]. Awaiting determination.

## 2019-08-31 NOTE — Progress Notes (Signed)
Subjective:    Patient ID: [facility], female    DOB: 1940-04-02, 79 y.o.   MRN: [id number]  DOS:  08/31/2019 Type of visit - description: Here for evaluation of depression Yesterday, she went to see her pain management doctor who knows her for long time and she was told that she was depressed. The patient thought about it and she agrees with that assessment. 3 months ago was diagnosed with rheumatoid arthritis, was prescribed medications that so far have not helped and that is very depressing to her. She is crying frequently, feeling sad, she likes to stay in bed. Denies suicidal ideas   Review of Systems See above   Past Medical History:  Diagnosis Date  . Allergy   . Concussion   . Falls   . Head injury   . Herpes   . Hypertension   . Migraines   . Nerve damage   . Shingles     Past Surgical History:  Procedure Laterality Date  . BRAIN SURGERY    . CRANIOTOMY FOR EXTRACRANIAL-INTRACRANIAL BYPASS    . EYE SURGERY  4.4.17   milia excision  . HIP SURGERY     intracranial surgery  . NERVE SURGERY    . ROOT CANAL    . TONSILLECTOMY      Allergies as of 08/31/2019      Reactions   Cephalosporins Shortness Of Breath   Breathing difficulty   Ciprofloxacin Hives, Shortness Of Breath   Keflex [cephalexin] Hives   Penicillins Hives   Sulfacetamide Sodium Hives   Xopenex [levalbuterol Hcl] Anxiety, Other (See Comments)   Tremors with violent body chattering and shaking   Pregabalin Other (See Comments)   Aspirin Other (See Comments)   Stomach pain/burning   Chromium Other (See Comments)   Blisters   Erythromycin Other (See Comments)   "stomach issues"   Flu Virus Vaccine Other (See Comments)   Numbness in legs   Hydrochlorothiazide Other (See Comments)   Migraine   Iodinated Diagnostic Agents    unknown unknown   Lactose Intolerance (gi) Other (See Comments)   "stomach issues"   Latex    Lisinopril Cough   Nitrofuran Derivatives Hives, Itching,  Swelling   Petrolatum Nausea Only   Shellfish Allergy Hives   Sulfa Antibiotics Hives   Other reaction(s): Other (See Comments) Flank pain   Troleandomycin Other (See Comments)   Unknown   Zostavax [zoster Vaccine Live] Other (See Comments)   Pt has been told by specialists not to receive vaccine d/t h/o herpes in bloodstream and shingles.   Avelox [moxifloxacin Hcl In Nacl] Nausea Only   Cefdinir Nausea And Vomiting   Eggs Or Egg-derived Products    Per allergy testing, no known reaction. Pt states she was told she could probably eat 2-3 eggs weekly w/o issue.   Levaquin [levofloxacin] Hives, Palpitations, Other (See Comments)   Chest pain   Percodan [oxycodone-aspirin] Nausea And Vomiting   Prednisone Palpitations   No injections, can do the pills      Medication List       Accurate as of August 31, 2019  9:17 AM. If you have any questions, ask your nurse or doctor.        albuterol 108 (90 Base) MCG/ACT inhaler Commonly known as: VENTOLIN HFA Inhale 2 puffs into the lungs every 6 (six) hours as needed for wheezing or shortness of breath.   ALPRAZolam 0.25 MG tablet Commonly known as: XANAX Take 1 tablet (0.25 mg  total) by mouth 2 (two) times daily as needed for anxiety.   atenolol 25 MG tablet Commonly known as: TENORMIN Take 1 tablet (25 mg total) by mouth daily.   butalbital-acetaminophen-caffeine 50-325-40 MG tablet Commonly known as: FIORICET Take by mouth 2 (two) times daily as needed for headache.   Bystolic 10 MG tablet Generic drug: nebivolol TAKE 1 TABLET BY MOUTH EVERY DAY   clopidogrel 75 MG tablet Commonly known as: PLAVIX Take 1 tablet (75 mg total) by mouth daily.   co-enzyme Q-10 30 MG capsule Take 30 mg by mouth daily.   D 5000 125 MCG (5000 UT) Tabs Generic drug: Cholecalciferol daily at 0600.   diclofenac Sodium 1 % Gel Commonly known as: VOLTAREN APPLY 2 GRAMS EXTERNALLY TO THE AFFECTED AREA FOUR TIMES DAILY   erythromycin ophthalmic  ointment 1 application.   fluticasone 50 MCG/ACT nasal spray Commonly known as: FLONASE Place 2 sprays into both nostrils daily.   furosemide 20 MG tablet Commonly known as: LASIX TAKE 1 TABLET BY MOUTH EVERY DAY ON OCCASION AS NEEDED FOR SWELLING   Lidoderm 5 % Generic drug: lidocaine Place 1 patch onto the skin.   Prednicarbate 0.1 % Crea   PriLOSEC OTC 20 MG tablet Generic drug: omeprazole Take 30 mg by mouth daily.   promethazine 25 MG tablet Commonly known as: PHENERGAN Take 1 tablet (25 mg total) by mouth every 6 (six) hours as needed for nausea or vomiting.   sodium chloride 0.65 % nasal spray Commonly known as: V-R NASAL SPRAY SALINE Place 1 spray into the nose as needed. Use in both nostrils twice daily   valsartan 160 MG tablet Commonly known as: DIOVAN Take 1.5 tablets (240 mg total) by mouth daily.   vitamin C 100 MG tablet Take 1,000 mg by mouth daily. Reported on 05/24/2015          Objective:   Physical Exam BP 127/75 (BP Location: Right Arm, Patient Position: Sitting, Cuff Size: Small)   Pulse (!) 54   Temp 97.6 F (36.4 C) (Oral)   Resp (!) 22   Ht 5' 2.5" (1.588 m)   Wt 193 lb 8 oz (87.8 kg)   SpO2 100%   BMI 34.83 kg/m  General:   Well developed, NAD, BMI noted. HEENT:  Normocephalic . Face symmetric, atraumatic Skin: Not pale. Not jaundice Neurologic:  alert & oriented X3.  Speech normal, gait appropriate for age and unassisted Psych--  Cognition and judgment appear intact.  Cooperative with normal attention span and concentration.  Behavior appropriate. Minimally anxious not depressed appearing.      Assessment    79 year old female, PMH includes HTN, TIA, asthma, GERD, Chronic pain syndrome, chronic back pain, presents with:  Depression: PHQ-9: 21 Symptoms as described above started gradually  according to the patient but they are much worse in the last few days, denies suicidal ideas, trigger for depression is the recent Dx  of rheumatoid arthritis. Explained patient available tools for depression management including psychotherapy, nonpharmacological measures such as yoga, meditation.  Finally medication.  20 years ago she was treated for depression with Prozac and she responded well. We also discussed the option to try duloxetine which may have some pain decreasing pain properties. Eventually we agreed on: Restart psychotherapy Fluoxetine, start gradually, see instructions Reassess in 6 weeks.   This visit occurred during the SARS-CoV-2 public health emergency.  Safety protocols were in place, including screening questions prior to the visit, additional usage of staff PPE, and extensive  cleaning of exam room while observing appropriate contact time as indicated for disinfecting solutions.

## 2019-09-08 DIAGNOSIS — L409 Psoriasis, unspecified: Secondary | ICD-10-CM | POA: Diagnosis not present

## 2019-09-12 ENCOUNTER — Encounter: Payer: Self-pay | Admitting: Family Medicine

## 2019-09-12 DIAGNOSIS — G2581 Restless legs syndrome: Secondary | ICD-10-CM

## 2019-09-12 MED ORDER — ROPINIROLE HCL 0.5 MG PO TABS: ORAL_TABLET | ORAL | 3 refills | Status: DC

## 2019-09-12 NOTE — Addendum Note (Signed)
Addended by: Lamar Blinks C on: 09/12/2019 08:01 PM   Modules accepted: Orders

## 2019-09-15 ENCOUNTER — Encounter: Payer: Self-pay | Admitting: Family Medicine

## 2019-09-20 ENCOUNTER — Other Ambulatory Visit: Payer: Self-pay

## 2019-09-20 ENCOUNTER — Other Ambulatory Visit: Payer: Self-pay | Admitting: Family Medicine

## 2019-09-20 DIAGNOSIS — I10 Essential (primary) hypertension: Secondary | ICD-10-CM

## 2019-09-20 MED ORDER — NEBIVOLOL HCL 10 MG PO TABS: 10.0000 mg | ORAL_TABLET | Freq: Every day | ORAL | 1 refills | Status: DC

## 2019-09-22 DIAGNOSIS — L4059 Other psoriatic arthropathy: Secondary | ICD-10-CM | POA: Diagnosis not present

## 2019-09-26 ENCOUNTER — Ambulatory Visit (INDEPENDENT_AMBULATORY_CARE_PROVIDER_SITE_OTHER): Payer: Medicare Other | Admitting: Adult Health

## 2019-09-26 ENCOUNTER — Encounter: Payer: Self-pay | Admitting: Adult Health

## 2019-09-26 ENCOUNTER — Ambulatory Visit (INDEPENDENT_AMBULATORY_CARE_PROVIDER_SITE_OTHER): Payer: Medicare Other

## 2019-09-26 ENCOUNTER — Other Ambulatory Visit: Payer: Self-pay

## 2019-09-26 VITALS — BP 116/62 | HR 78 | Temp 97.2°F | Ht 64.0 in | Wt 197.4 lb

## 2019-09-26 DIAGNOSIS — R0609 Other forms of dyspnea: Secondary | ICD-10-CM

## 2019-09-26 DIAGNOSIS — R0600 Dyspnea, unspecified: Secondary | ICD-10-CM | POA: Diagnosis not present

## 2019-09-26 DIAGNOSIS — K449 Diaphragmatic hernia without obstruction or gangrene: Secondary | ICD-10-CM | POA: Diagnosis not present

## 2019-09-26 DIAGNOSIS — J984 Other disorders of lung: Secondary | ICD-10-CM

## 2019-09-26 DIAGNOSIS — J4540 Moderate persistent asthma, uncomplicated: Secondary | ICD-10-CM | POA: Diagnosis not present

## 2019-09-26 MED ORDER — ALBUTEROL SULFATE HFA 108 (90 BASE) MCG/ACT IN AERS: 1.0000 | INHALATION_SPRAY | Freq: Four times a day (QID) | RESPIRATORY_TRACT | 1 refills | Status: DC | PRN

## 2019-09-26 NOTE — Assessment & Plan Note (Signed)
Reactive airways versus mild intermittent asthma.  Use albuterol as needed If symptoms continue will consider repeat pulmonary function testing.

## 2019-09-26 NOTE — Progress Notes (Signed)
@Patient  ID: Angela Gibbs, female    DOB: 15-Aug-1940, 79 y.o.   MRN: [id number]  Chief Complaint  Patient presents with  . Follow-up    Restrictive lung disease    Referring provider: CoplandGay Filler, MD  HPI: 79 yo female never smoker followed for restrictive lung disease secondary to scoliosis and moderate Hiatal hernia .    TEST/EVENTS :  Spirometry 03/2016 showed a ratio of 81 with FEV1 of 55% and FVC of 51%  Spirometry 04/2016 and again showed a ratio of 89 with FEV1 of 54% and FVC of 45% again indicating moderate restriction Both these spirometry shows suboptimal efforts of 2-3 seconds  09/26/2019 Follow up: Restrictive lung disease,  Patient returns for a follow-up visit.  She was last seen in 2019.  Patient says overall she is doing about the same and she feels that her shortness of breath got better when she did pulmonary rehab.  She was doing pulmonary rehab right before the COVID-19 pandemic started.  Felt that her activity tolerance had improved quite a bit.  However pulmonary rehab had to be stopped due to COVID-19.  She wants a referral back there so she can finish the program and regain some of her strength back.  She does try to go to exercises but feels that she does get short of breath with activity.  She is also gained 20 pounds in the last year with inactivity.  Patient has known restrictive lung disease secondary to scoliosis.  She denies any increased cough congestion wheezing.  Allergies  Allergen Reactions  . Cephalosporins Shortness Of Breath    Breathing difficulty  . Ciprofloxacin Hives and Shortness Of Breath  . Keflex [Cephalexin] Hives  . Penicillins Hives  . Sulfacetamide Sodium Hives  . Xopenex [Levalbuterol Hcl] Anxiety and Other (See Comments)    Tremors with violent body chattering and shaking  . Pregabalin Other (See Comments)  . Aspirin Other (See Comments)    Stomach pain/burning  . Chromium Other (See Comments)    Blisters  .  Erythromycin Other (See Comments)    "stomach issues"  . Flu Virus Vaccine Other (See Comments)    Numbness in legs  . Hydrochlorothiazide Other (See Comments)    Migraine  . Iodinated Diagnostic Agents     unknown unknown  . Lactose Intolerance (Gi) Other (See Comments)    "stomach issues"  . Latex   . Lisinopril Cough  . Nitrofuran Derivatives Hives, Itching and Swelling  . Petrolatum Nausea Only  . Shellfish Allergy Hives  . Sulfa Antibiotics Hives    Other reaction(s): Other (See Comments) Flank pain  . Troleandomycin Other (See Comments)    Unknown  . Zostavax [Zoster Vaccine Live] Other (See Comments)    Pt has been told by specialists not to receive vaccine d/t h/o herpes in bloodstream and shingles.  . Avelox [Moxifloxacin Hcl In Nacl] Nausea Only  . Cefdinir Nausea And Vomiting  . Eggs Or Egg-Derived Products     Per allergy testing, no known reaction. Pt states she was told she could probably eat 2-3 eggs weekly w/o issue.  . Levaquin [Levofloxacin] Hives, Palpitations and Other (See Comments)    Chest pain  . Percodan [Oxycodone-Aspirin] Nausea And Vomiting  . Prednisone Palpitations    No injections, can do the pills    Immunization History  Administered Date(s) Administered  . PFIZER SARS-COV-2 Vaccination 03/09/2019, 03/30/2019  . Pneumococcal Conjugate-13 11/10/2013, 02/20/2016  . Pneumococcal Polysaccharide-23 01/24/2019  . Td  06/11/2016  . Tdap 02/17/2006    Past Medical History:  Diagnosis Date  . Allergy   . Concussion   . Falls   . Head injury   . Herpes   . Hypertension   . Migraines   . Nerve damage   . Shingles     Tobacco History: Social History   Tobacco Use  Smoking Status Never Smoker  Smokeless Tobacco Never Used   Counseling given: Not Answered   Outpatient Medications Prior to Visit  Medication Sig Dispense Refill  . ALPRAZolam (XANAX) 0.25 MG tablet Take 1 tablet (0.25 mg total) by mouth 2 (two) times daily as needed  for anxiety. 30 tablet 1  . Ascorbic Acid (VITAMIN C) 100 MG tablet Take 1,000 mg by mouth daily. Reported on 05/24/2015    . atenolol (TENORMIN) 25 MG tablet Take 1 tablet (25 mg total) by mouth daily. 90 tablet 3  . butalbital-acetaminophen-caffeine (FIORICET, ESGIC) 50-325-40 MG per tablet Take by mouth 2 (two) times daily as needed for headache.    . Cholecalciferol (D 5000) 5000 UNITS TABS daily at 0600.    . clopidogrel (PLAVIX) 75 MG tablet Take 1 tablet (75 mg total) by mouth daily. 30 tablet 3  . co-enzyme Q-10 30 MG capsule Take 30 mg by mouth daily.    . diclofenac Sodium (VOLTAREN) 1 % GEL APPLY 2 GRAMS EXTERNALLY TO THE AFFECTED AREA FOUR TIMES DAILY 100 g 0  . erythromycin ophthalmic ointment 1 application.     Marland Kitchen FLUoxetine (PROZAC) 20 MG tablet Half tablet daily for 10 days.  Then 1 tablet daily for 10 days.  Then 1.5 tablets every day. 45 tablet 1  . fluticasone (FLONASE) 50 MCG/ACT nasal spray Place 2 sprays into both nostrils daily. 16 g 5  . furosemide (LASIX) 20 MG tablet TAKE 1 TABLET BY MOUTH EVERY DAY ON OCCASION AS NEEDED FOR SWELLING 90 tablet 1  . lidocaine (LIDODERM) 5 % Place 1 patch onto the skin.     Marland Kitchen nebivolol (BYSTOLIC) 10 MG tablet Take 1 tablet (10 mg total) by mouth daily. 90 tablet 1  . omeprazole (PRILOSEC OTC) 20 MG tablet Take 30 mg by mouth daily.     . Prednicarbate 0.1 % CREA     . promethazine (PHENERGAN) 25 MG tablet Take 1 tablet (25 mg total) by mouth every 6 (six) hours as needed for nausea or vomiting. 30 tablet 0  . rOPINIRole (REQUIP) 0.5 MG tablet Take 0.25 mg at bedtime for 2 days, then increase to 0.5 mg for 1 week, then 1 mg 60 tablet 3  . sodium chloride (V-R NASAL SPRAY SALINE) 0.65 % nasal spray Place 1 spray into the nose as needed. Use in both nostrils twice daily 30 mL 6  . valsartan (DIOVAN) 160 MG tablet Take 1.5 tablets (240 mg total) by mouth daily. 135 tablet 3  . albuterol (PROVENTIL HFA;VENTOLIN HFA) 108 (90 Base) MCG/ACT inhaler  Inhale 2 puffs into the lungs every 6 (six) hours as needed for wheezing or shortness of breath. 1 Inhaler 6   No facility-administered medications prior to visit.     Review of Systems:   Constitutional:   No  weight loss, night sweats,  Fevers, chills, fatigue, or  lassitude.  HEENT:   No headaches,  Difficulty swallowing,  Tooth/dental problems, or  Sore throat,                No sneezing, itching, ear ache, nasal congestion, post nasal  drip,   CV:  No chest pain,  Orthopnea, PND, swelling in lower extremities, anasarca, dizziness, palpitations, syncope.   GI  No heartburn, indigestion, abdominal pain, nausea, vomiting, diarrhea, change in bowel habits, loss of appetite, bloody stools.   Resp:  No chest wall deformity  Skin: no rash or lesions.  GU: no dysuria, change in color of urine, no urgency or frequency.  No flank pain, no hematuria   MS:  No joint pain or swelling.  No decreased range of motion.  No back pain.    Physical Exam  BP 116/62 (BP Location: Left Arm, Cuff Size: Normal)   Pulse 78   Temp (!) 97.2 F (36.2 C) (Oral)   Ht 5\' 4"  (1.626 m)   Wt 197 lb 6.4 oz (89.5 kg)   SpO2 91%   BMI 33.88 kg/m   GEN: A/Ox3; pleasant , NAD, well nourished    HEENT:  Odin/AT,  NOSE-clear, THROAT-clear, no lesions, no postnasal drip or exudate noted.   NECK:  Supple w/ fair ROM; no JVD; normal carotid impulses w/o bruits; no thyromegaly or nodules palpated; no lymphadenopathy.    RESP  Clear  P & A; w/o, wheezes/ rales/ or rhonchi. no accessory muscle use, no dullness to percussion  CARD:  RRR, no m/r/g, no peripheral edema, pulses intact, no cyanosis or clubbing.  GI:   Soft & nt; nml bowel sounds; no organomegaly or masses detected.   Musco: Warm bil, no deformities or joint swelling noted.   Neuro: alert, no focal deficits noted.    Skin: Warm, no lesions or rashes    Lab Results:   Imaging: No results found.    No flowsheet data found.  No results  found for: NITRICOXIDE      Assessment & Plan:   Restrictive lung disease Restrictive lung disease secondary to scoliosis.  Previous pulmonary function testing was consistent with restriction.  Doubt much underlying asthma as no coughing or wheezing. Advised may use her albuterol inhaler as needed.  Suspect deconditioning is playing a role in her clinical symptoms.  We will check chest x-ray today. Refer to pulmonary rehab. Discussed an overnight oximetry test however patient declines at this time Plan  Patient Instructions  Refer to pulmonary rehab.  Chest xray today .  Call back if you change your mind on Overnight oximetry test.  Follow up with Dr. [name]  In 3 months and As needed        Moderate persistent asthma without complication Reactive airways versus mild intermittent asthma.  Use albuterol as needed If symptoms continue will consider repeat pulmonary function testing.     Rexene Edison, NP 09/26/2019

## 2019-09-26 NOTE — Assessment & Plan Note (Signed)
Restrictive lung disease secondary to scoliosis.  Previous pulmonary function testing was consistent with restriction.  Doubt much underlying asthma as no coughing or wheezing. Advised may use her albuterol inhaler as needed.  Suspect deconditioning is playing a role in her clinical symptoms.  We will check chest x-ray today. Refer to pulmonary rehab. Discussed an overnight oximetry test however patient declines at this time Plan  Patient Instructions  Refer to pulmonary rehab.  Chest xray today .  Call back if you change your mind on Overnight oximetry test.  Follow up with Dr. [name]  In 3 months and As needed

## 2019-09-26 NOTE — Patient Instructions (Addendum)
Refer to pulmonary rehab.  Chest xray today .  Call back if you change your mind on Overnight oximetry test.  Follow up with Dr. [name]  In 3 months and As needed

## 2019-09-28 ENCOUNTER — Other Ambulatory Visit: Payer: Self-pay | Admitting: Pulmonary Disease

## 2019-09-28 DIAGNOSIS — Z6834 Body mass index (BMI) 34.0-34.9, adult: Secondary | ICD-10-CM | POA: Diagnosis not present

## 2019-09-28 DIAGNOSIS — M2550 Pain in unspecified joint: Secondary | ICD-10-CM | POA: Diagnosis not present

## 2019-09-28 DIAGNOSIS — L4059 Other psoriatic arthropathy: Secondary | ICD-10-CM | POA: Diagnosis not present

## 2019-09-28 DIAGNOSIS — J984 Other disorders of lung: Secondary | ICD-10-CM

## 2019-09-28 DIAGNOSIS — L409 Psoriasis, unspecified: Secondary | ICD-10-CM | POA: Diagnosis not present

## 2019-09-28 DIAGNOSIS — J4540 Moderate persistent asthma, uncomplicated: Secondary | ICD-10-CM

## 2019-09-28 DIAGNOSIS — E669 Obesity, unspecified: Secondary | ICD-10-CM | POA: Diagnosis not present

## 2019-09-28 DIAGNOSIS — M150 Primary generalized (osteo)arthritis: Secondary | ICD-10-CM | POA: Diagnosis not present

## 2019-09-28 DIAGNOSIS — J3089 Other allergic rhinitis: Secondary | ICD-10-CM

## 2019-09-28 NOTE — Patient Instructions (Addendum)
Good to see you again today!  Please get the shingles vaccine at your pharmacy if not done already !  Let's continue prozac since it is helping you- I sent in an rx for a year for you Please let me know if your depression sx are coming back  Please plan to see me in about 6 months

## 2019-09-28 NOTE — Progress Notes (Addendum)
[facility] [address] [place], [place] [address] [phone]  Date:  09/29/2019   Name:  Angela Gibbs   DOB:  1940/10/28   MRN:  [id number]  PCP:  Darreld Mclean, MD    Chief Complaint: Trouble Breathing (shortness of breath, worsened this summer)   History of Present Illness:  Angela Gibbs is a 79 y.o. very pleasant female patient who presents with the following:  Patient seen today with concern for breathing issues Never smoker History of TIA, restrictive lung disease secondary to scoliosis and hiatal hernia, asthma, hypertension Last seen by myself in March of this year  She saw her pulmonology provider, Tammy Parrett last week: 09/26/2019 Follow up: Restrictive lung disease,  Patient returns for a follow-up visit.  She was last seen in 2019.  Patient says overall she is doing about the same and she feels that her shortness of breath got better when she did pulmonary rehab.  She was doing pulmonary rehab right before the COVID-19 pandemic started.  Felt that her activity tolerance had improved quite a bit.  However pulmonary rehab had to be stopped due to COVID-19.  She wants a referral back there so she can finish the program and regain some of her strength back.  She does try to go to exercises but feels that she does get short of breath with activity.  She is also gained 20 pounds in the last year with inactivity.  Patient has known restrictive lung disease secondary to scoliosis.  She denies any increased cough congestion wheezing.///// Restrictive lung disease Restrictive lung disease secondary to scoliosis.  Previous pulmonary function testing was consistent with restriction.  Doubt much underlying asthma as no coughing or wheezing. Advised may use her albuterol inhaler as needed.  Suspect deconditioning is playing a role in her clinical symptoms.  We will check chest x-ray today. Refer to pulmonary  rehab. Discussed an overnight oximetry test however patient declines at this time Plan  Patient Instructions  Refer to pulmonary rehab.  Chest xray today .  Call back if you change your mind on Overnight oximetry test.  Follow up with Dr. [name]  In 3 months and As needed    Offer dexa scan- per pt this is done by her GYN  covid done shingrix-suggested she have this done at pharmacy  Past month she started on prozac or depression and this is helping her a lot.  Patient states she had not really realized how depressed she was, but now she is feeling much better She is now on 30 mg and this is working well for her I refilled this for her to continue long term Her burning tongue is also better with using iron OTC  They have been trying to get her back in pulmonary rehab but this has been stalled due to covid 19   She is NOT taking lasix right now-this is just a as needed medication Patient Active Problem List   Diagnosis Date Noted  . TIA (transient ischemic attack) 05/18/2019  . Restrictive lung disease 05/13/2016  . Moderate persistent asthma without complication 88/41/6606  . Current use of beta blocker 04/11/2016  . Other allergic rhinitis 04/11/2016  . Gastroesophageal reflux disease without esophagitis 04/11/2016  . Essential hypertension 02/20/2016  . Abdominal pain, acute, right upper quadrant 07/29/2013    Past Medical History:  Diagnosis Date  . Allergy   . Concussion   .  Falls   . Head injury   . Herpes   . Hypertension   . Migraines   . Nerve damage   . Shingles     Past Surgical History:  Procedure Laterality Date  . BRAIN SURGERY    . CRANIOTOMY FOR EXTRACRANIAL-INTRACRANIAL BYPASS    . EYE SURGERY  4.4.17   milia excision  . HIP SURGERY     intracranial surgery  . NERVE SURGERY    . ROOT CANAL    . TONSILLECTOMY      Social History   Tobacco Use  . Smoking status: Never Smoker  . Smokeless tobacco: Never Used  Substance Use Topics  . Alcohol  use: Yes    Comment: social  . Drug use: No    Family History  Problem Relation Age of Onset  . Heart disease Father   . Heart attack Father     Allergies  Allergen Reactions  . Cephalosporins Shortness Of Breath    Breathing difficulty  . Ciprofloxacin Hives and Shortness Of Breath  . Keflex [Cephalexin] Hives  . Penicillins Hives  . Sulfacetamide Sodium Hives  . Xopenex [Levalbuterol Hcl] Anxiety and Other (See Comments)    Tremors with violent body chattering and shaking  . Pregabalin Other (See Comments)  . Aspirin Other (See Comments)    Stomach pain/burning  . Chromium Other (See Comments)    Blisters  . Erythromycin Other (See Comments)    "stomach issues"  . Flu Virus Vaccine Other (See Comments)    Numbness in legs  . Hydrochlorothiazide Other (See Comments)    Migraine  . Iodinated Diagnostic Agents     unknown unknown  . Lactose Intolerance (Gi) Other (See Comments)    "stomach issues"  . Latex   . Lisinopril Cough  . Nitrofuran Derivatives Hives, Itching and Swelling  . Petrolatum Nausea Only  . Shellfish Allergy Hives  . Sulfa Antibiotics Hives    Other reaction(s): Other (See Comments) Flank pain  . Troleandomycin Other (See Comments)    Unknown  . Zostavax [Zoster Vaccine Live] Other (See Comments)    Pt has been told by specialists not to receive vaccine d/t h/o herpes in bloodstream and shingles.  . Avelox [Moxifloxacin Hcl In Nacl] Nausea Only  . Cefdinir Nausea And Vomiting  . Eggs Or Egg-Derived Products     Per allergy testing, no known reaction. Pt states she was told she could probably eat 2-3 eggs weekly w/o issue.  . Levaquin [Levofloxacin] Hives, Palpitations and Other (See Comments)    Chest pain  . Percodan [Oxycodone-Aspirin] Nausea And Vomiting  . Prednisone Palpitations    No injections, can do the pills    Medication list has been reviewed and updated.  Current Outpatient Medications on File Prior to Visit  Medication Sig  Dispense Refill  . albuterol (VENTOLIN HFA) 108 (90 Base) MCG/ACT inhaler Inhale 1-2 puffs into the lungs every 6 (six) hours as needed for wheezing or shortness of breath. 1 g 1  . ALPRAZolam (XANAX) 0.25 MG tablet Take 1 tablet (0.25 mg total) by mouth 2 (two) times daily as needed for anxiety. 30 tablet 1  . Ascorbic Acid (VITAMIN C) 100 MG tablet Take 1,000 mg by mouth daily. Reported on 05/24/2015    . atenolol (TENORMIN) 25 MG tablet Take 1 tablet (25 mg total) by mouth daily. 90 tablet 3  . butalbital-acetaminophen-caffeine (FIORICET, ESGIC) 50-325-40 MG per tablet Take by mouth 2 (two) times daily as needed for headache.    Marland Kitchen  Cholecalciferol (D 5000) 5000 UNITS TABS daily at 0600.    . clopidogrel (PLAVIX) 75 MG tablet Take 1 tablet (75 mg total) by mouth daily. 30 tablet 3  . co-enzyme Q-10 30 MG capsule Take 30 mg by mouth daily.    . diclofenac Sodium (VOLTAREN) 1 % GEL APPLY 2 GRAMS EXTERNALLY TO THE AFFECTED AREA FOUR TIMES DAILY 100 g 0  . erythromycin ophthalmic ointment 1 application.     . fluticasone (FLONASE) 50 MCG/ACT nasal spray Place 2 sprays into both nostrils daily. 16 g 5  . furosemide (LASIX) 20 MG tablet TAKE 1 TABLET BY MOUTH EVERY DAY ON OCCASION AS NEEDED FOR SWELLING 90 tablet 1  . lidocaine (LIDODERM) 5 % Place 1 patch onto the skin.     Marland Kitchen nebivolol (BYSTOLIC) 10 MG tablet Take 1 tablet (10 mg total) by mouth daily. 90 tablet 1  . omeprazole (PRILOSEC OTC) 20 MG tablet Take 30 mg by mouth daily.     . Prednicarbate 0.1 % CREA     . promethazine (PHENERGAN) 25 MG tablet Take 1 tablet (25 mg total) by mouth every 6 (six) hours as needed for nausea or vomiting. 30 tablet 0  . rOPINIRole (REQUIP) 0.5 MG tablet Take 0.25 mg at bedtime for 2 days, then increase to 0.5 mg for 1 week, then 1 mg 60 tablet 3  . sodium chloride (V-R NASAL SPRAY SALINE) 0.65 % nasal spray Place 1 spray into the nose as needed. Use in both nostrils twice daily 30 mL 6  . valsartan (DIOVAN) 160  MG tablet Take 1.5 tablets (240 mg total) by mouth daily. 135 tablet 3   No current facility-administered medications on file prior to visit.    Review of Systems:  As per HPI- otherwise negative.   Physical Examination: Vitals:   09/29/19 1404 09/29/19 1424  BP: (!) 158/80 130/75  Pulse: 71   Resp: (!) 21   Temp: 98 F (36.7 C)   SpO2: 98%    Vitals:   09/29/19 1404  Weight: 198 lb (89.8 kg)  Height: 5\' 4"  (1.626 m)   Body mass index is 33.99 kg/m. Ideal Body Weight: Weight in (lb) to have BMI = 25: 145.3  GEN: no acute distress.  Overweight, looks well and her normal self HEENT: Atraumatic, Normocephalic.  Ears and Nose: No external deformity. CV: RRR, No M/G/R. No JVD. No thrill. No extra heart sounds. PULM: CTA B, no wheezes, crackles, rhonchi. No retractions. No resp. distress. No accessory muscle use.Marland Kitchen EXTR: No c/c/e PSYCH: Normally interactive. Conversant.   BP Readings from Last 3 Encounters:  09/29/19 130/75  09/26/19 116/62  08/31/19 127/75    Assessment and Plan: Essential hypertension - Plan: Basic metabolic panel  Reactive depression - Plan: FLUoxetine (PROZAC) 20 MG tablet  Hyponatremia - Plan: Basic metabolic panel  Patient here today to follow-up on depression.  She is now taking Prozac 30 mg daily with very good results.  I refilled this for her so she can continue it long-term History of hyponatremia, will check a BMP today Blood pressures under good control.  She is not taking hydrochlorothiazide  Asked her to follow-up in 6 months assuming all is well  Will plan further follow- up pending labs.  This visit occurred during the SARS-CoV-2 public health emergency.  Safety protocols were in place, including screening questions prior to the visit, additional usage of staff PPE, and extensive cleaning of exam room while observing appropriate contact time as indicated  for disinfecting solutions.    Signed Lamar Blinks, MD  addnd 8/13-  received her labs as follows Called pt She feels fine Hyponatremia is worse than baseline- may be due to addition of prozac recently.  She is ok stopping this medication.  Will avoid drinking if not thirsty Increase salt intake Recheck labs next week  Results for orders placed or performed in visit on 46/27/03  Basic metabolic panel  Result Value Ref Range   Sodium 123 (L) 135 - 145 mEq/L   Potassium 4.9 3.5 - 5.1 mEq/L   Chloride 91 (L) 96 - 112 mEq/L   CO2 27 19 - 32 mEq/L   Glucose, Bld 94 70 - 99 mg/dL   BUN 13 6 - 23 mg/dL   Creatinine, Ser 0.70 0.40 - 1.20 mg/dL   GFR 80.70 >60.00 mL/min   Calcium 9.0 8.4 - 10.5 mg/dL

## 2019-09-29 ENCOUNTER — Encounter: Payer: Self-pay | Admitting: Family Medicine

## 2019-09-29 ENCOUNTER — Other Ambulatory Visit: Payer: Self-pay

## 2019-09-29 ENCOUNTER — Ambulatory Visit (INDEPENDENT_AMBULATORY_CARE_PROVIDER_SITE_OTHER): Payer: Medicare Other | Admitting: Family Medicine

## 2019-09-29 ENCOUNTER — Other Ambulatory Visit: Payer: Self-pay | Admitting: *Deleted

## 2019-09-29 VITALS — BP 130/75 | HR 71 | Temp 98.0°F | Resp 21 | Ht 64.0 in | Wt 198.0 lb

## 2019-09-29 DIAGNOSIS — F329 Major depressive disorder, single episode, unspecified: Secondary | ICD-10-CM | POA: Diagnosis not present

## 2019-09-29 DIAGNOSIS — I10 Essential (primary) hypertension: Secondary | ICD-10-CM | POA: Diagnosis not present

## 2019-09-29 DIAGNOSIS — J984 Other disorders of lung: Secondary | ICD-10-CM

## 2019-09-29 DIAGNOSIS — E871 Hypo-osmolality and hyponatremia: Secondary | ICD-10-CM | POA: Diagnosis not present

## 2019-09-29 MED ORDER — FLUOXETINE HCL 20 MG PO TABS: ORAL_TABLET | ORAL | 3 refills | Status: DC

## 2019-09-30 LAB — BASIC METABOLIC PANEL
BUN: 13 mg/dL (ref 6–23)
CO2: 27 mEq/L (ref 19–32)
Calcium: 9 mg/dL (ref 8.4–10.5)
Chloride: 91 mEq/L — ABNORMAL LOW (ref 96–112)
Creatinine, Ser: 0.7 mg/dL (ref 0.40–1.20)
GFR: 80.7 mL/min (ref >60.00)
Glucose, Bld: 94 mg/dL (ref 70–99)
Potassium: 4.9 mEq/L (ref 3.5–5.1)
Sodium: 123 mEq/L — ABNORMAL LOW (ref 135–145)

## 2019-10-03 DIAGNOSIS — E871 Hypo-osmolality and hyponatremia: Secondary | ICD-10-CM

## 2019-10-05 ENCOUNTER — Encounter: Payer: Self-pay | Admitting: Family Medicine

## 2019-10-05 ENCOUNTER — Encounter (HOSPITAL_COMMUNITY): Payer: Self-pay | Admitting: *Deleted

## 2019-10-05 NOTE — Progress Notes (Signed)
Received referral from Dr. [name] for this pt to participate in pulmonary rehab with the the diagnosis of Asthma. Clinical review of pt follow up appt on 8/9 with Rexene Edison NP Pulmonary office note. Pt previously participated in Pulmonary rehab at [facility] in 202 prior to the departmental closure due to Covid.   Unsure if they have resumed their program.  Some months ago they had not. Will check with them to see.  Pt with Covid Risk Score - 5. Pt appropriate for scheduling for Pulmonary rehab.   If pt would like to proceed with participating here at [facility], will forward to pulmonary rehab staff for scheduling. Cherre Huger, BSN Cardiac and Training and development officer

## 2019-10-07 ENCOUNTER — Other Ambulatory Visit (INDEPENDENT_AMBULATORY_CARE_PROVIDER_SITE_OTHER): Payer: Medicare Other

## 2019-10-07 ENCOUNTER — Other Ambulatory Visit: Payer: Self-pay

## 2019-10-07 DIAGNOSIS — E871 Hypo-osmolality and hyponatremia: Secondary | ICD-10-CM

## 2019-10-07 LAB — BASIC METABOLIC PANEL
BUN: 13 mg/dL (ref 7–25)
CO2: 25 mmol/L (ref 20–32)
Calcium: 8.9 mg/dL (ref 8.6–10.4)
Chloride: 96 mmol/L — ABNORMAL LOW (ref 98–110)
Creat: 0.76 mg/dL (ref 0.60–0.93)
Glucose, Bld: 110 mg/dL — ABNORMAL HIGH (ref 65–99)
Potassium: 4.5 mmol/L (ref 3.5–5.3)
Sodium: 129 mmol/L — ABNORMAL LOW (ref 135–146)

## 2019-10-09 ENCOUNTER — Encounter: Payer: Self-pay | Admitting: Family Medicine

## 2019-10-09 ENCOUNTER — Other Ambulatory Visit: Payer: Self-pay | Admitting: Family Medicine

## 2019-10-09 DIAGNOSIS — E871 Hypo-osmolality and hyponatremia: Secondary | ICD-10-CM

## 2019-10-11 ENCOUNTER — Encounter: Payer: Self-pay | Admitting: Pulmonary Disease

## 2019-10-11 ENCOUNTER — Ambulatory Visit (INDEPENDENT_AMBULATORY_CARE_PROVIDER_SITE_OTHER): Payer: Medicare Other | Admitting: Pulmonary Disease

## 2019-10-11 ENCOUNTER — Other Ambulatory Visit: Payer: Self-pay | Admitting: Family Medicine

## 2019-10-11 ENCOUNTER — Other Ambulatory Visit: Payer: Self-pay

## 2019-10-11 DIAGNOSIS — J984 Other disorders of lung: Secondary | ICD-10-CM | POA: Diagnosis not present

## 2019-10-11 DIAGNOSIS — J4540 Moderate persistent asthma, uncomplicated: Secondary | ICD-10-CM | POA: Diagnosis not present

## 2019-10-11 DIAGNOSIS — J849 Interstitial pulmonary disease, unspecified: Secondary | ICD-10-CM

## 2019-10-11 MED ORDER — BUPROPION HCL ER (SR) 150 MG PO TB12: 150.0000 mg | ORAL_TABLET | Freq: Two times a day (BID) | ORAL | 5 refills | Status: DC

## 2019-10-11 NOTE — Progress Notes (Signed)
   Subjective:    Patient ID: Angela Gibbs, female    DOB: Sep 01, 1940, 79 y.o.   MRN: [id number]  HPI  79 yo never smoker for FU  of restrictive lung disease  PMH - scoliosis and moderate hiatal hernia  She reports an allergic reaction to penicillin 2013  and onset of asthma since then. She denies childhood history of asthma.   She presented with shortness of breath in 2018 which improved after she discontinued fentanyl patch. Dyspnea recurred in 2019 and she was referred to pulmonary rehab, had dramatic improvement in her shortness of breath and she was able to walk 2 blocks.  However she was not able to enroll in maintenance rehab at [facility] and the Covid pandemic started.  She admits to being sedentary since then and dyspneic after walking a short distance. Accompanied by her husband who corroborates history.  She also developed hyponatremia as low as 123 and has been using sodium supplements, most recent sodium increased to 129  Chest x-ray was obtained which shows prominent aortic knob, mild right basal interstitial prominence, moderate hiatal hernia as prior.  Reviewed all prior x-rays She has known about hiatal hernia for many years  Significant tests/ events reviewed  Spirometry 03/2016 showed a ratio of 81 with FEV1 of 55% and FVC of 51% Spirometry 04/2016 and again showed a ratio of 89 with FEV1 of 54% and FVC of 45% again indicating moderate restriction Both these spirometry shows suboptimal efforts of 2-3 seconds  Review of Systems Patient denies significant dyspnea,cough, hemoptysis,  chest pain, palpitations, pedal edema, orthopnea, paroxysmal nocturnal dyspnea, lightheadedness, nausea, vomiting, abdominal or  leg pains      Objective:   Physical Exam  Gen. Pleasant, well-nourished, in no distress ENT - no thrush, no pallor/icterus,no post nasal drip Neck: No JVD, no thyromegaly, no carotid bruits Lungs: scoliosis, no use of accessory muscles, no dullness to  percussion, clear without rales or rhonchi  Cardiovascular: Rhythm regular, heart sounds  normal, no murmurs or gallops, 1+ peripheral edema Musculoskeletal: No deformities, no cyanosis or clubbing        Assessment & Plan:

## 2019-10-11 NOTE — Patient Instructions (Signed)
Ambulatory saturation. High-resolution CT of the chest to rule out pulmonary fibrosis.  Referral to pulmonary rehab at [facility]

## 2019-10-11 NOTE — Assessment & Plan Note (Addendum)
Main issue does seem to be restrictive lung disease due to scoliosis and severe deconditioning.  Of concern is mild desaturation on ambulation room 98 to 92% We will try to enroll her again in pulmonary rehab at [facility]. We will proceed with high-resolution CT chest to rule out pulmonary fibrosis, more likely this is mild scarring related to aspiration from the hernia. If no  pathology found, consider echo and repeating PFTs.  She does have mild pedal edema which she attributes to sodium supplementation

## 2019-10-11 NOTE — Assessment & Plan Note (Signed)
She does not seem to have bronchospasm, uses albuterol MDI as needed, no reason to escalate

## 2019-10-12 ENCOUNTER — Ambulatory Visit: Payer: Medicare Other | Admitting: Pulmonary Disease

## 2019-10-12 ENCOUNTER — Telehealth: Payer: Self-pay | Admitting: Pulmonary Disease

## 2019-10-12 NOTE — Telephone Encounter (Signed)
ATC Patient.  LM to call back. 

## 2019-10-12 NOTE — Telephone Encounter (Signed)
Dr [name], please advise if okay to refer pt to pulm rehab in [place], thanks

## 2019-10-12 NOTE — Addendum Note (Signed)
Addended by: Satira Sark D on: 10/12/2019 08:52 AM   Modules accepted: Orders

## 2019-10-12 NOTE — Telephone Encounter (Signed)
Okay to refer to [facility] rehab

## 2019-10-13 ENCOUNTER — Ambulatory Visit: Payer: Medicare Other | Admitting: Family Medicine

## 2019-10-13 ENCOUNTER — Telehealth (HOSPITAL_COMMUNITY): Payer: Self-pay

## 2019-10-13 NOTE — Telephone Encounter (Signed)
Contacted [facility] and Angela Gibbs stated that they have not opened their pulmonary rehab department back up. Advised pt of this and pt stated that she lives 40 minutes away from [place] and that since she cant go to [place] she wanted to go to [place] [facility]. Called [facility] and left a message for them to call back so I can get their fax information. Called again with no answer.

## 2019-10-16 ENCOUNTER — Encounter: Payer: Self-pay | Admitting: Family Medicine

## 2019-10-17 ENCOUNTER — Ambulatory Visit (INDEPENDENT_AMBULATORY_CARE_PROVIDER_SITE_OTHER): Payer: Medicare Other | Admitting: Family Medicine

## 2019-10-17 ENCOUNTER — Ambulatory Visit (HOSPITAL_BASED_OUTPATIENT_CLINIC_OR_DEPARTMENT_OTHER)
Admission: RE | Admit: 2019-10-17 | Discharge: 2019-10-17 | Disposition: A | Discharge status: Home / Self Care | Payer: Medicare Other | Source: Ambulatory Visit | Attending: Pulmonary Disease | Admitting: Pulmonary Disease

## 2019-10-17 ENCOUNTER — Telehealth: Payer: Self-pay | Admitting: Pulmonary Disease

## 2019-10-17 ENCOUNTER — Encounter: Payer: Self-pay | Admitting: Family Medicine

## 2019-10-17 ENCOUNTER — Other Ambulatory Visit: Payer: Self-pay

## 2019-10-17 VITALS — BP 136/83 | HR 67 | Temp 98.1°F | Resp 13 | Ht 62.0 in | Wt 193.8 lb

## 2019-10-17 DIAGNOSIS — J849 Interstitial pulmonary disease, unspecified: Secondary | ICD-10-CM | POA: Insufficient documentation

## 2019-10-17 DIAGNOSIS — F410 Panic disorder [episodic paroxysmal anxiety] without agoraphobia: Secondary | ICD-10-CM | POA: Diagnosis not present

## 2019-10-17 DIAGNOSIS — I10 Essential (primary) hypertension: Secondary | ICD-10-CM | POA: Diagnosis not present

## 2019-10-17 DIAGNOSIS — J479 Bronchiectasis, uncomplicated: Secondary | ICD-10-CM | POA: Diagnosis not present

## 2019-10-17 DIAGNOSIS — E871 Hypo-osmolality and hyponatremia: Secondary | ICD-10-CM | POA: Diagnosis not present

## 2019-10-17 DIAGNOSIS — I700 Atherosclerosis of aorta: Secondary | ICD-10-CM | POA: Diagnosis not present

## 2019-10-17 DIAGNOSIS — J984 Other disorders of lung: Secondary | ICD-10-CM | POA: Insufficient documentation

## 2019-10-17 DIAGNOSIS — K449 Diaphragmatic hernia without obstruction or gangrene: Secondary | ICD-10-CM | POA: Diagnosis not present

## 2019-10-17 DIAGNOSIS — J4540 Moderate persistent asthma, uncomplicated: Secondary | ICD-10-CM | POA: Insufficient documentation

## 2019-10-17 MED ORDER — ALPRAZOLAM 0.25 MG PO TABS: 0.2500 mg | ORAL_TABLET | Freq: Two times a day (BID) | ORAL | 1 refills | Status: DC | PRN

## 2019-10-17 NOTE — Telephone Encounter (Signed)
Message received from Dr. [name] to place order for Pulmonary rehab for patient. Order placed, he is aware. Nothing further needed at this time.

## 2019-10-17 NOTE — Addendum Note (Signed)
Addended by: Lia Foyer R on: 10/17/2019 02:45 PM   Modules accepted: Orders

## 2019-10-17 NOTE — Progress Notes (Addendum)
[place] [facility] [place], [place],  [address] [phone]  Date:  10/17/2019   Name:  Angela Gibbs   DOB:  1940/06/23   MRN:  [id number]  PCP:  Darreld Mclean, MD    Chief Complaint: Increasing BP concerns   History of Present Illness:  Angela Gibbs is a 79 y.o. very pleasant female patient who presents with the following:  Patient with history of TIA, restrictive lung disease and asthma, GERD, hypertension Seen by myself about 2 weeks on August 12 ago for breathing issues and also hyponatremia  She had done well pulmonary rehabilitation in the past, unfortunately this was interrupted due to COVID-19 At her last visit she had recently started on Prozac, this was working well for her mood but unfortunately was causing worsening hyponatremia with sodium of 123  We had her stop Prozac, changed over to Wellbutrin in hopes of treating depression with less risk of hyponatremia Her sodium was rechecked on 8/20, improved to 129 We noted longer-term history of hyponatremia, patient reports this is also affected several family members.  We referred her to nephrology for further evaluation  She saw her pulmonologist, Dr. [name] on August 24-  Main issue does seem to be restrictive lung disease due to scoliosis and severe deconditioning.  Of concern is mild desaturation on ambulation room 98 to 92% We will try to enroll her again in pulmonary rehab at [facility]. We will proceed with high-resolution CT chest to rule out pulmonary fibrosis, more likely this is mild scarring related to aspiration from the hernia. If no  pathology found, consider echo and repeating PFTs.  She does have mild pedal edema which she attributes to sodium supplementation  She had a CT of her chest done just this afternoon, report not back yet  Patient contacted me today with concern of elevated blood pressure so we brought her in today for  a visit  My blood pressure has been climbing and climbing since adding Welbutrin (Bupropion) the anti-depressant  drug.  I noticed my shaking hands and hour after taking the meds last night.  When bedtime came I took my restless legs drug Ropinrok 0.5 mg tabs took two tablets.  I checked my blood pressure and it was very high 190/120 at midnight.  I awakened to 170/110 this morning at 9 a.m. and took 2 valsartan went back to bed.  Awakened at 11:30 a.m. to 194/92 took anxiety pill Alprazolam.  at 12:26 b/p was 189/90.  I was very anxiety since I was alone, my husband was at church.   He arrived at 12:45 took b/p again at 1 pm and it was 170/80.   Took again at 3 pm 170/81.   At 5 pm bp was 186/82 and I took one valsartan and one alprazolam   BP Readings from Last 3 Encounters:  10/17/19 136/83  10/11/19 124/76  09/29/19 130/75   She notes BP elevation and since about 2 days ago She was getting elevated numbers as mentioned above  She will sometimes get a HA when her blood pressure is high, but no chest pain  She is taking valsartan 160 and 80 at night Her husband notes that anxiety is probably playing a role.  He has noted that when she takes alprazolam-which she does rarely-her shortness of breath is much better  Patient Active Problem List   Diagnosis Date Noted  . TIA (transient ischemic  attack) 05/18/2019  . Restrictive lung disease 05/13/2016  . Moderate persistent asthma without complication 29/47/6546  . Current use of beta blocker 04/11/2016  . Other allergic rhinitis 04/11/2016  . Gastroesophageal reflux disease without esophagitis 04/11/2016  . Essential hypertension 02/20/2016  . Abdominal pain, acute, right upper quadrant 07/29/2013    Past Medical History:  Diagnosis Date  . Allergy   . Concussion   . Falls   . Head injury   . Herpes   . Hypertension   . Migraines   . Nerve damage   . Shingles     Past Surgical History:  Procedure Laterality Date  . BRAIN  SURGERY    . CRANIOTOMY FOR EXTRACRANIAL-INTRACRANIAL BYPASS    . EYE SURGERY  4.4.17   milia excision  . HIP SURGERY     intracranial surgery  . NERVE SURGERY    . ROOT CANAL    . TONSILLECTOMY      Social History   Tobacco Use  . Smoking status: Never Smoker  . Smokeless tobacco: Never Used  Substance Use Topics  . Alcohol use: Yes    Comment: social  . Drug use: No    Family History  Problem Relation Age of Onset  . Heart disease Father   . Heart attack Father     Allergies  Allergen Reactions  . Cephalosporins Shortness Of Breath    Breathing difficulty  . Ciprofloxacin Hives and Shortness Of Breath  . Keflex [Cephalexin] Hives  . Penicillins Hives  . Sulfacetamide Sodium Hives  . Xopenex [Levalbuterol Hcl] Anxiety and Other (See Comments)    Tremors with violent body chattering and shaking  . Pregabalin Other (See Comments)  . Aspirin Other (See Comments)    Stomach pain/burning  . Chromium Other (See Comments)    Blisters  . Erythromycin Other (See Comments)    "stomach issues"  . Flu Virus Vaccine Other (See Comments)    Numbness in legs  . Hydrochlorothiazide Other (See Comments)    Migraine  . Iodinated Diagnostic Agents     unknown unknown  . Lactose Intolerance (Gi) Other (See Comments)    "stomach issues"  . Latex   . Lisinopril Cough  . Nitrofuran Derivatives Hives, Itching and Swelling  . Petrolatum Nausea Only  . Shellfish Allergy Hives  . Sulfa Antibiotics Hives    Other reaction(s): Other (See Comments) Flank pain  . Troleandomycin Other (See Comments)    Unknown no longer available  . Zostavax [Zoster Vaccine Live] Other (See Comments)    Pt has been told by specialists not to receive vaccine d/t h/o herpes in bloodstream and shingles.  . Avelox [Moxifloxacin Hcl In Nacl] Nausea Only  . Cefdinir Nausea And Vomiting  . Eggs Or Egg-Derived Products     Per allergy testing, no known reaction. Pt states she was told she could probably  eat 2-3 eggs weekly w/o issue.  . Levaquin [Levofloxacin] Hives, Palpitations and Other (See Comments)    Chest pain  . Percodan [Oxycodone-Aspirin] Nausea And Vomiting  . Prednisone Palpitations    No injections, can do the pills    Medication list has been reviewed and updated.  Current Outpatient Medications on File Prior to Visit  Medication Sig Dispense Refill  . albuterol (VENTOLIN HFA) 108 (90 Base) MCG/ACT inhaler Inhale 1-2 puffs into the lungs every 6 (six) hours as needed for wheezing or shortness of breath. 1 g 1  . Ascorbic Acid (VITAMIN C) 100 MG tablet Take 1,000  mg by mouth daily. Reported on 05/24/2015    . atenolol (TENORMIN) 25 MG tablet Take 1 tablet (25 mg total) by mouth daily. 90 tablet 3  . butalbital-acetaminophen-caffeine (FIORICET, ESGIC) 50-325-40 MG per tablet Take by mouth 2 (two) times daily as needed for headache.    . Cholecalciferol (D 5000) 5000 UNITS TABS daily at 0600.    . clopidogrel (PLAVIX) 75 MG tablet Take 1 tablet (75 mg total) by mouth daily. 30 tablet 3  . co-enzyme Q-10 30 MG capsule Take 30 mg by mouth daily.    . diclofenac Sodium (VOLTAREN) 1 % GEL APPLY 2 GRAMS EXTERNALLY TO THE AFFECTED AREA FOUR TIMES DAILY 100 g 0  . erythromycin ophthalmic ointment 1 application.     . fluticasone (FLONASE) 50 MCG/ACT nasal spray Place 2 sprays into both nostrils daily. 16 g 5  . furosemide (LASIX) 20 MG tablet TAKE 1 TABLET BY MOUTH EVERY DAY ON OCCASION AS NEEDED FOR SWELLING 90 tablet 1  . lidocaine (LIDODERM) 5 % Place 1 patch onto the skin.     Marland Kitchen nebivolol (BYSTOLIC) 10 MG tablet Take 1 tablet (10 mg total) by mouth daily. 90 tablet 1  . omeprazole (PRILOSEC OTC) 20 MG tablet Take 30 mg by mouth daily.     . Prednicarbate 0.1 % CREA     . promethazine (PHENERGAN) 25 MG tablet Take 1 tablet (25 mg total) by mouth every 6 (six) hours as needed for nausea or vomiting. 30 tablet 0  . rOPINIRole (REQUIP) 0.5 MG tablet Take 0.25 mg at bedtime for 2 days,  then increase to 0.5 mg for 1 week, then 1 mg 60 tablet 3  . sodium chloride (V-R NASAL SPRAY SALINE) 0.65 % nasal spray Place 1 spray into the nose as needed. Use in both nostrils twice daily 30 mL 6  . valsartan (DIOVAN) 160 MG tablet Take 1.5 tablets (240 mg total) by mouth daily. 135 tablet 3   No current facility-administered medications on file prior to visit.    Review of Systems:  As per HPI- otherwise negative.   Physical Examination: Vitals:   10/17/19 1541  BP: 136/83  Pulse: 67  Resp: 13  Temp: 98.1 F (36.7 C)  SpO2: 98%   Vitals:   10/17/19 1541  Weight: 193 lb 12.8 oz (87.9 kg)  Height: 5\' 2"  (1.575 m)   Body mass index is 35.45 kg/m. Ideal Body Weight: Weight in (lb) to have BMI = 25: 136.4  GEN: no acute distress.  Overweight, looks well.  Accompanied by her husband today HEENT: Atraumatic, Normocephalic.  Ears and Nose: No external deformity. CV: RRR, No M/G/R. No JVD. No thrill. No extra heart sounds. PULM: CTA B, no wheezes, crackles, rhonchi. No retractions. No resp. distress. No accessory muscle use. EXTR: No c/c/e PSYCH: Normally interactive. Conversant.    Assessment and Plan: Low sodium levels - Plan: Basic metabolic panel, CANCELED: Basic metabolic panel  Panic attack - Plan: ALPRAZolam (XANAX) 0.25 MG tablet  Essential hypertension  Patient here today with concern of anxiety and elevated blood pressure We started her on bupropion recently, unfortunately this may not be a good fit for her and could be increasing her anxiety.  We will have her stop this medication She would like to potentially start something else, I will check her sodium and then get in touch with her Blood pressure actually looks okay at the current moment-continue current antihypertensive regimen  Refilled her alprazolam, she got 30 pills  about a year ago and is not using them.  I did caution her about potential habit formation This visit occurred during the SARS-CoV-2  public health emergency.  Safety protocols were in place, including screening questions prior to the visit, additional usage of staff PPE, and extensive cleaning of exam room while observing appropriate contact time as indicated for disinfecting solutions.     Signed Lamar Blinks, MD  Received her BMP 8/31- message to pt.  Sodium better, might consider remeron or effexor as alternatives less likely to drop her sodium  All antidepressants except mianserin are associated with hyponatremia. The association is strongest with citalopram and lowest with duloxetine, venlafaxine and mirtazapine.  Results for orders placed or performed in visit on 01/60/10  Basic metabolic panel  Result Value Ref Range   Glucose, Bld 111 (H) 65 - 99 mg/dL   BUN 18 7 - 25 mg/dL   Creat 0.83 0.60 - 0.93 mg/dL   BUN/Creatinine Ratio NOT APPLICABLE 6 - 22 (calc)   Sodium 132 (L) 135 - 146 mmol/L   Potassium 4.7 3.5 - 5.3 mmol/L   Chloride 99 98 - 110 mmol/L   CO2 27 20 - 32 mmol/L   Calcium 9.5 8.6 - 10.4 mg/dL

## 2019-10-17 NOTE — Patient Instructions (Signed)
Good to see you again today!  Let me see how your sodium looks and we can think about a different medication for depression for you Stop wellbutrin Ok to use alprazolam/ xanax as needed for anxiety.  Be judicious with use as this can be habit forming

## 2019-10-18 ENCOUNTER — Encounter: Payer: Self-pay | Admitting: Family Medicine

## 2019-10-18 DIAGNOSIS — E871 Hypo-osmolality and hyponatremia: Secondary | ICD-10-CM

## 2019-10-18 LAB — BASIC METABOLIC PANEL
BUN: 18 mg/dL (ref 7–25)
CO2: 27 mmol/L (ref 20–32)
Calcium: 9.5 mg/dL (ref 8.6–10.4)
Chloride: 99 mmol/L (ref 98–110)
Creat: 0.83 mg/dL (ref 0.60–0.93)
Glucose, Bld: 111 mg/dL — ABNORMAL HIGH (ref 65–99)
Potassium: 4.7 mmol/L (ref 3.5–5.3)
Sodium: 132 mmol/L — ABNORMAL LOW (ref 135–146)

## 2019-10-18 NOTE — Telephone Encounter (Signed)
lmtcb for pt.  

## 2019-10-19 NOTE — Telephone Encounter (Signed)
Referral has been taken care of and confirmation has been received from [facility]. Nothing further needed at this time.

## 2019-10-21 ENCOUNTER — Other Ambulatory Visit: Payer: Self-pay

## 2019-10-21 DIAGNOSIS — J984 Other disorders of lung: Secondary | ICD-10-CM

## 2019-10-21 DIAGNOSIS — J4540 Moderate persistent asthma, uncomplicated: Secondary | ICD-10-CM

## 2019-10-21 DIAGNOSIS — J3089 Other allergic rhinitis: Secondary | ICD-10-CM

## 2019-11-01 ENCOUNTER — Other Ambulatory Visit: Payer: Self-pay | Admitting: Internal Medicine

## 2019-11-08 ENCOUNTER — Other Ambulatory Visit (INDEPENDENT_AMBULATORY_CARE_PROVIDER_SITE_OTHER): Payer: Medicare Other

## 2019-11-08 ENCOUNTER — Other Ambulatory Visit: Payer: Self-pay

## 2019-11-08 DIAGNOSIS — E871 Hypo-osmolality and hyponatremia: Secondary | ICD-10-CM | POA: Diagnosis not present

## 2019-11-09 ENCOUNTER — Telehealth: Payer: Self-pay | Admitting: Family Medicine

## 2019-11-09 ENCOUNTER — Encounter: Payer: Self-pay | Admitting: Family Medicine

## 2019-11-09 LAB — BASIC METABOLIC PANEL
BUN: 16 mg/dL (ref 7–25)
CO2: 26 mmol/L (ref 20–32)
Calcium: 9.4 mg/dL (ref 8.6–10.4)
Chloride: 96 mmol/L — ABNORMAL LOW (ref 98–110)
Creat: 0.77 mg/dL (ref 0.60–0.93)
Glucose, Bld: 120 mg/dL — ABNORMAL HIGH (ref 65–99)
Potassium: 4.4 mmol/L (ref 3.5–5.3)
Sodium: 130 mmol/L — ABNORMAL LOW (ref 135–146)

## 2019-11-09 NOTE — Telephone Encounter (Signed)
Please advise. Not on current med list 

## 2019-11-09 NOTE — Telephone Encounter (Signed)
Medication: DR Ward nasal spray    Has the patient contacted their pharmacy?  (If no, request that the patient contact the pharmacy for the refill.) (If yes, when and what did the pharmacy advise?)     Preferred Pharmacy (with phone number or street name):  [facility] - [place], [place] - [address] Phone:  [phone]  Fax:  [phone]          Agent: Please be advised that RX refills may take up to 3 business days. We ask that you follow-up with your pharmacy.

## 2019-11-10 ENCOUNTER — Ambulatory Visit: Payer: Medicare Other | Admitting: Pulmonary Disease

## 2019-11-10 NOTE — Telephone Encounter (Signed)
Called her pharmacy  Gave verbal approval  The spray contains mupirocin, DMSO, sudafed, cromolyn powder

## 2019-11-14 DIAGNOSIS — J4540 Moderate persistent asthma, uncomplicated: Secondary | ICD-10-CM | POA: Diagnosis not present

## 2019-11-14 DIAGNOSIS — J984 Other disorders of lung: Secondary | ICD-10-CM | POA: Diagnosis not present

## 2019-11-14 DIAGNOSIS — J849 Interstitial pulmonary disease, unspecified: Secondary | ICD-10-CM | POA: Diagnosis not present

## 2019-11-16 DIAGNOSIS — J984 Other disorders of lung: Secondary | ICD-10-CM | POA: Diagnosis not present

## 2019-11-16 DIAGNOSIS — J4540 Moderate persistent asthma, uncomplicated: Secondary | ICD-10-CM | POA: Diagnosis not present

## 2019-11-16 DIAGNOSIS — J849 Interstitial pulmonary disease, unspecified: Secondary | ICD-10-CM | POA: Diagnosis not present

## 2019-11-17 DIAGNOSIS — Z79899 Other long term (current) drug therapy: Secondary | ICD-10-CM | POA: Diagnosis not present

## 2019-11-17 DIAGNOSIS — L4059 Other psoriatic arthropathy: Secondary | ICD-10-CM | POA: Diagnosis not present

## 2019-11-18 LAB — COMPREHENSIVE METABOLIC PANEL
Calcium: 9 (ref 8.7–10.7)
GFR calc non Af Amer: 65

## 2019-11-18 LAB — HEPATIC FUNCTION PANEL
ALT: 16 (ref 7–35)
AST: 15 (ref 13–35)
Alkaline Phosphatase: 86 (ref 25–125)
Bilirubin, Total: 0.2

## 2019-11-18 LAB — CBC AND DIFFERENTIAL
HCT: 29 — AB (ref 36–46)
Hemoglobin: 9 — AB (ref 12.0–16.0)
Platelets: 307 (ref 150–399)
WBC: 6.1

## 2019-11-18 LAB — BASIC METABOLIC PANEL
BUN: 17 (ref 4–21)
CO2: 23 — AB (ref 13–22)
Chloride: 99 (ref 99–108)
Creatinine: 0.9 (ref 0.5–1.1)
Glucose: 80
Potassium: 4.4 (ref 3.4–5.3)
Sodium: 134 — AB (ref 137–147)

## 2019-11-19 ENCOUNTER — Encounter: Payer: Self-pay | Admitting: Family Medicine

## 2019-11-23 DIAGNOSIS — J4540 Moderate persistent asthma, uncomplicated: Secondary | ICD-10-CM | POA: Diagnosis not present

## 2019-11-23 DIAGNOSIS — J984 Other disorders of lung: Secondary | ICD-10-CM | POA: Diagnosis not present

## 2019-11-23 DIAGNOSIS — J849 Interstitial pulmonary disease, unspecified: Secondary | ICD-10-CM | POA: Diagnosis not present

## 2019-11-24 ENCOUNTER — Telehealth: Payer: Self-pay | Admitting: Pulmonary Disease

## 2019-11-24 DIAGNOSIS — J984 Other disorders of lung: Secondary | ICD-10-CM | POA: Diagnosis not present

## 2019-11-24 DIAGNOSIS — J4540 Moderate persistent asthma, uncomplicated: Secondary | ICD-10-CM | POA: Diagnosis not present

## 2019-11-24 DIAGNOSIS — J849 Interstitial pulmonary disease, unspecified: Secondary | ICD-10-CM | POA: Diagnosis not present

## 2019-11-24 NOTE — Telephone Encounter (Signed)
Rigoberto Noel, MD  10/17/2019 5:35 PM EDT     No evidence of pulmonary fibrosis which is reassuring. Some evidence of damage to small airways -with air trapping and bronchiectasis (airways are damaged like varicose veins)  -As we discussed, this may be related to aspiration from hiatal hernia which is again noted -Does not fully explain her significant symptoms however   Again reviewed CT chest results with pt.  Pt expressed understanding.  Nothing further needed at this time- will close encounter.

## 2019-11-27 ENCOUNTER — Encounter: Payer: Self-pay | Admitting: Family Medicine

## 2019-11-28 ENCOUNTER — Other Ambulatory Visit: Payer: Self-pay | Admitting: Family Medicine

## 2019-11-28 ENCOUNTER — Other Ambulatory Visit: Payer: Self-pay

## 2019-11-28 ENCOUNTER — Telehealth: Payer: Self-pay

## 2019-11-28 ENCOUNTER — Encounter: Payer: Self-pay | Admitting: Family Medicine

## 2019-11-28 DIAGNOSIS — I10 Essential (primary) hypertension: Secondary | ICD-10-CM

## 2019-11-28 DIAGNOSIS — J849 Interstitial pulmonary disease, unspecified: Secondary | ICD-10-CM | POA: Diagnosis not present

## 2019-11-28 DIAGNOSIS — D649 Anemia, unspecified: Secondary | ICD-10-CM

## 2019-11-28 DIAGNOSIS — J4540 Moderate persistent asthma, uncomplicated: Secondary | ICD-10-CM | POA: Diagnosis not present

## 2019-11-28 DIAGNOSIS — E871 Hypo-osmolality and hyponatremia: Secondary | ICD-10-CM

## 2019-11-28 DIAGNOSIS — J984 Other disorders of lung: Secondary | ICD-10-CM | POA: Diagnosis not present

## 2019-11-28 NOTE — Telephone Encounter (Signed)
Patient husband is calling trying to get blood work today.

## 2019-11-28 NOTE — Telephone Encounter (Signed)
Labs ordered and pt scheduled for tomoroww

## 2019-11-29 ENCOUNTER — Other Ambulatory Visit (INDEPENDENT_AMBULATORY_CARE_PROVIDER_SITE_OTHER): Payer: Medicare Other

## 2019-11-29 ENCOUNTER — Other Ambulatory Visit: Payer: Self-pay

## 2019-11-29 ENCOUNTER — Encounter: Payer: Self-pay | Admitting: Family Medicine

## 2019-11-29 ENCOUNTER — Telehealth: Payer: Self-pay | Admitting: *Deleted

## 2019-11-29 DIAGNOSIS — D649 Anemia, unspecified: Secondary | ICD-10-CM | POA: Diagnosis not present

## 2019-11-29 DIAGNOSIS — E871 Hypo-osmolality and hyponatremia: Secondary | ICD-10-CM

## 2019-11-29 LAB — CBC
HCT: 26.3 % — ABNORMAL LOW (ref 35.0–45.0)
Hemoglobin: 8.1 g/dL — ABNORMAL LOW (ref 11.7–15.5)
MCH: 25.3 pg — ABNORMAL LOW (ref 27.0–33.0)
MCHC: 30.8 g/dL — ABNORMAL LOW (ref 32.0–36.0)
MCV: 82.2 fL (ref 80.0–100.0)
MPV: 9.3 fL (ref 7.5–12.5)
Platelets: 278 10*3/uL (ref 140–400)
RBC: 3.2 10*6/uL — ABNORMAL LOW (ref 3.80–5.10)
RDW: 17.1 % — ABNORMAL HIGH (ref 11.0–15.0)
WBC: 5.6 10*3/uL (ref 3.8–10.8)

## 2019-11-29 NOTE — Telephone Encounter (Signed)
Pt came in for lab appointment this morning and said we were supposed to be checking her hemoglobin.  The only future order in Epic was for Cmet and bmet. I cancelled the bmet and drew the [place]. I also drew an extra lavender. Can you place order for hemoglobin in appropriate?

## 2019-11-29 NOTE — Addendum Note (Signed)
Addended by: Kelle Darting A on: 11/29/2019 01:15 PM   Modules accepted: Orders

## 2019-11-29 NOTE — Telephone Encounter (Signed)
Error

## 2019-11-29 NOTE — Telephone Encounter (Signed)
I have ordered CBC for hemoglobin to be checked. My apologies. Thanks for fixing it.

## 2019-11-30 LAB — COMPREHENSIVE METABOLIC PANEL
AG Ratio: 1.6 (calc) (ref 1.0–2.5)
ALT: 13 U/L (ref 6–29)
AST: 13 U/L (ref 10–35)
Albumin: 3.8 g/dL (ref 3.6–5.1)
Alkaline phosphatase (APISO): 70 U/L (ref 37–153)
BUN: 15 mg/dL (ref 7–25)
CO2: 25 mmol/L (ref 20–32)
Calcium: 8.8 mg/dL (ref 8.6–10.4)
Chloride: 99 mmol/L (ref 98–110)
Creat: 0.69 mg/dL (ref 0.60–0.93)
Globulin: 2.4 g/dL (calc) (ref 1.9–3.7)
Glucose, Bld: 89 mg/dL (ref 65–99)
Potassium: 4.4 mmol/L (ref 3.5–5.3)
Sodium: 131 mmol/L — ABNORMAL LOW (ref 135–146)
Total Bilirubin: 0.3 mg/dL (ref 0.2–1.2)
Total Protein: 6.2 g/dL (ref 6.1–8.1)

## 2019-12-06 NOTE — Progress Notes (Addendum)
[place] at [facility] [address], [place],  [address] [phone]  Date:  12/07/2019   Name:  Angela Gibbs   DOB:  28-Mar-1940   MRN:  [id number]  PCP:  Darreld Mclean, MD    Chief Complaint: Edema (bilateral leg edema) and Low Hemoglobin   History of Present Illness:  Angela Gibbs is a 79 y.o. very pleasant female patient who presents with the following:  Patient here today to follow-up on anemia- history of TIA, restrictive lung disease and asthma, GERD, hypertension Last seen by myself in August of this year Since that time, she saw her rheumatologist Dr. Trudie Reed and was noted to have low hemoglobin at 9.0.  I had her come in for recheck 12 days later and her hemoglobin had dropped to 8.1 Dr. Trudie Reed is managing her psoriatic arthritis and psoriasis; she is using the Simponi Aria infusion She is not aware of any bleeding or blood in her stool She does not appear pale as far she can tell  She also tends to have hyponatremia, which can be worsened by fluoxetine Most recent sodium on October 12 was 131  She tends to get a lot of LE edema especially when she is in the car traveling for any distance She does wear compression as much as she is able - this helps but does not control her sx She is using topical diclofenac on her legs, this does help her somewhat  COVID-19 primary series done,?  Booster Pt notes she had a bad reaction to flu in the past so she does not continue to do this   She notes that she feels tired and upset about her legs being swollen but otherwise not particularly bad She will take a furosemide prn -she is fair with this due to hyponatremia She denies any orthopnea.  Her legs do improve overnight  She did a colonoscopy in 2009 but cologuard last year was negative  Patient Active Problem List   Diagnosis Date Noted  . TIA (transient ischemic attack) 05/18/2019  . Restrictive lung  disease 05/13/2016  . Moderate persistent asthma without complication 18/56/3149  . Current use of beta blocker 04/11/2016  . Other allergic rhinitis 04/11/2016  . Gastroesophageal reflux disease without esophagitis 04/11/2016  . Essential hypertension 02/20/2016  . Abdominal pain, acute, right upper quadrant 07/29/2013    Past Medical History:  Diagnosis Date  . Allergy   . Concussion   . Falls   . Head injury   . Herpes   . Hypertension   . Migraines   . Nerve damage   . Shingles     Past Surgical History:  Procedure Laterality Date  . BRAIN SURGERY    . CRANIOTOMY FOR EXTRACRANIAL-INTRACRANIAL BYPASS    . EYE SURGERY  4.4.17   milia excision  . HIP SURGERY     intracranial surgery  . NERVE SURGERY    . ROOT CANAL    . TONSILLECTOMY      Social History   Tobacco Use  . Smoking status: Never Smoker  . Smokeless tobacco: Never Used  Substance Use Topics  . Alcohol use: Yes    Comment: social  . Drug use: No    Family History  Problem Relation Age of Onset  . Heart disease Father   . Heart attack Father     Allergies  Allergen Reactions  . Cephalosporins Shortness Of Breath    Breathing  difficulty  . Ciprofloxacin Hives and Shortness Of Breath  . Keflex [Cephalexin] Hives  . Penicillins Hives  . Sulfacetamide Sodium Hives  . Xopenex [Levalbuterol Hcl] Anxiety and Other (See Comments)    Tremors with violent body chattering and shaking  . Pregabalin Other (See Comments)  . Ropinirole Hcl Nausea And Vomiting  . Aspirin Other (See Comments)    Stomach pain/burning  . Chromium Other (See Comments)    Blisters  . Erythromycin Other (See Comments)    "stomach issues"  . Flu Virus Vaccine Other (See Comments)    Numbness in legs  . Hydrochlorothiazide Other (See Comments)    Migraine  . Iodinated Diagnostic Agents     unknown unknown  . Lactose Intolerance (Gi) Other (See Comments)    "stomach issues"  . Latex   . Lisinopril Cough  . Nitrofuran  Derivatives Hives, Itching and Swelling  . Petrolatum Nausea Only  . Shellfish Allergy Hives  . Sulfa Antibiotics Hives    Other reaction(s): Other (See Comments) Flank pain  . Troleandomycin Other (See Comments)    Unknown no longer available  . Zostavax [Zoster Vaccine Live] Other (See Comments)    Pt has been told by specialists not to receive vaccine d/t h/o herpes in bloodstream and shingles.  . Avelox [Moxifloxacin Hcl In Nacl] Nausea Only  . Cefdinir Nausea And Vomiting  . Eggs Or Egg-Derived Products     Per allergy testing, no known reaction. Pt states she was told she could probably eat 2-3 eggs weekly w/o issue.  . Levaquin [Levofloxacin] Hives, Palpitations and Other (See Comments)    Chest pain  . Percodan [Oxycodone-Aspirin] Nausea And Vomiting  . Prednisone Palpitations    No injections, can do the pills    Medication list has been reviewed and updated.  Current Outpatient Medications on File Prior to Visit  Medication Sig Dispense Refill  . albuterol (VENTOLIN HFA) 108 (90 Base) MCG/ACT inhaler Inhale 1-2 puffs into the lungs every 6 (six) hours as needed for wheezing or shortness of breath. 1 g 1  . ALPRAZolam (XANAX) 0.25 MG tablet Take 1 tablet (0.25 mg total) by mouth 2 (two) times daily as needed for anxiety. 30 tablet 1  . Ascorbic Acid (VITAMIN C) 100 MG tablet Take 1,000 mg by mouth daily. Reported on 05/24/2015    . atenolol (TENORMIN) 25 MG tablet Take 1 tablet (25 mg total) by mouth daily. 90 tablet 3  . butalbital-acetaminophen-caffeine (FIORICET, ESGIC) 50-325-40 MG per tablet Take by mouth 2 (two) times daily as needed for headache.    . Cholecalciferol (D 5000) 5000 UNITS TABS daily at 0600.    . clopidogrel (PLAVIX) 75 MG tablet Take 1 tablet (75 mg total) by mouth daily. 30 tablet 3  . co-enzyme Q-10 30 MG capsule Take 30 mg by mouth daily.    . diclofenac Sodium (VOLTAREN) 1 % GEL APPLY 2 GRAMS EXTERNALLY TO THE AFFECTED AREA FOUR TIMES DAILY 100 g 0   . erythromycin ophthalmic ointment 1 application.     Marland Kitchen FLUoxetine (PROZAC) 20 MG tablet TAKE 1/2 TABLET EVERY DAY FOR 10 DAYS, THEN 1 TABLET EVERY DAY FOR 10 DAYS, THEN 1& 1/2 TABLET EVERY DAY 45 tablet 1  . fluticasone (FLONASE) 50 MCG/ACT nasal spray Place 2 sprays into both nostrils daily. 16 g 5  . furosemide (LASIX) 20 MG tablet TAKE 1 TABLET BY MOUTH EVERY DAY ON OCCASION AS NEEDED FOR SWELLING 90 tablet 1  . lidocaine (LIDODERM) 5 %  Place 1 patch onto the skin.     Marland Kitchen nebivolol (BYSTOLIC) 10 MG tablet TAKE 1 TABLET(10 MG) BY MOUTH DAILY 90 tablet 1  . omeprazole (PRILOSEC OTC) 20 MG tablet Take 30 mg by mouth daily.     . Prednicarbate 0.1 % CREA     . promethazine (PHENERGAN) 25 MG tablet Take 1 tablet (25 mg total) by mouth every 6 (six) hours as needed for nausea or vomiting. 30 tablet 0  . sodium chloride (V-R NASAL SPRAY SALINE) 0.65 % nasal spray Place 1 spray into the nose as needed. Use in both nostrils twice daily 30 mL 6  . valsartan (DIOVAN) 160 MG tablet Take 1.5 tablets (240 mg total) by mouth daily. 135 tablet 3   No current facility-administered medications on file prior to visit.    Review of Systems:  As per HPI- otherwise negative.   Physical Examination: Vitals:   12/07/19 1337  BP: (!) 154/82  Pulse: 62  Resp: 20  SpO2: 98%   Vitals:   12/07/19 1337  Weight: 192 lb (87.1 kg)  Height: 5\' 2"  (1.575 m)   Body mass index is 35.12 kg/m. Ideal Body Weight: Weight in (lb) to have BMI = 25: 136.4  GEN: no acute distress.  Obese, otherwise looks well HEENT: Atraumatic, Normocephalic.  Ears and Nose: No external deformity. CV: RRR, No M/G/R. No JVD. No thrill. No extra heart sounds. PULM: CTA B, no wheezes, crackles, rhonchi. No retractions. No resp. distress. No accessory muscle use. ABD: S, NT, ND, +BS. No rebound. No HSM. EXTR: No c/c/ she has significant soft pitting edema both lower extremities PSYCH: Normally interactive. Conversant.  No gross  blood on rectal exam, but Hemoccult testing positive  Assessment and Plan: Anemia, unspecified type - Plan: CBC, Comprehensive metabolic panel, Ferritin, POC Hemoccult Bld/Stl (1-Cd Office Dx), CANCELED: IFOBT POC (occult bld, rslt in office)  Lower extremity edema - Plan: B Nat Peptide  Blood in stool - Plan: POC Hemoccult Bld/Stl (1-Cd Office Dx)  Bilateral edema of lower extremity - Plan: furosemide (LASIX) 20 MG tablet  Psoriatic arthritis (HCC)  Psoriasis  Osteoarthritis, unspecified osteoarthritis type, unspecified site  Patient today with anemia, suspect blood loss through the GI system.  I would like to get her in with gastroenterology as soon as possible.  The patient states she actually has a GI doctor she works with through [facility], she agrees to call them today and arrange an appointment, she will let me know if she needs any help for me in this regard Lower extremity edema is likely due to venous insufficiency.  We will check a BNP today.  She does use Lasix on occasion, we are sparing due to history of hyponatremia Will plan further follow- up pending labs.   This visit occurred during the SARS-CoV-2 public health emergency.  Safety protocols were in place, including screening questions prior to the visit, additional usage of staff PPE, and extensive cleaning of exam room while observing appropriate contact time as indicated for disinfecting solutions.    Signed Lamar Blinks, MD  addnd 10/21- received her labs as follows, message to pt  Results for orders placed or performed in visit on 12/07/19  CBC  Result Value Ref Range   WBC 5.8 3.8 - 10.8 Thousand/uL   RBC 3.35 (L) 3.80 - 5.10 Million/uL   Hemoglobin 8.2 (L) 11.7 - 15.5 g/dL   HCT 27.0 (L) 35 - 45 %   MCV 80.6 80.0 - 100.0  fL   MCH 24.5 (L) 27.0 - 33.0 pg   MCHC 30.4 (L) 32.0 - 36.0 g/dL   RDW 16.9 (H) 11.0 - 15.0 %   Platelets 367 140 - 400 Thousand/uL   MPV 9.1 7.5 - 12.5 fL  Comprehensive  metabolic panel  Result Value Ref Range   Glucose, Bld 97 65 - 99 mg/dL   BUN 14 7 - 25 mg/dL   Creat 0.76 0.60 - 0.93 mg/dL   BUN/Creatinine Ratio NOT APPLICABLE 6 - 22 (calc)   Sodium 130 (L) 135 - 146 mmol/L   Potassium 4.8 3.5 - 5.3 mmol/L   Chloride 96 (L) 98 - 110 mmol/L   CO2 25 20 - 32 mmol/L   Calcium 9.2 8.6 - 10.4 mg/dL   Total Protein 6.7 6.1 - 8.1 g/dL   Albumin 4.0 3.6 - 5.1 g/dL   Globulin 2.7 1.9 - 3.7 g/dL (calc)   AG Ratio 1.5 1.0 - 2.5 (calc)   Total Bilirubin 0.3 0.2 - 1.2 mg/dL   Alkaline phosphatase (APISO) 81 37 - 153 U/L   AST 14 10 - 35 U/L   ALT 15 6 - 29 U/L  B Nat Peptide  Result Value Ref Range   Brain Natriuretic Peptide 144 (H) <100 pg/mL  Ferritin  Result Value Ref Range   Ferritin 35 16 - 288 ng/mL  POC Hemoccult Bld/Stl (1-Cd Office Dx)  Result Value Ref Range   Card #1 Date 10/20/201    Fecal Occult Blood, POC Positive (A) Negative

## 2019-12-07 ENCOUNTER — Encounter: Payer: Self-pay | Admitting: Family Medicine

## 2019-12-07 ENCOUNTER — Ambulatory Visit (INDEPENDENT_AMBULATORY_CARE_PROVIDER_SITE_OTHER): Payer: Medicare Other | Admitting: Family Medicine

## 2019-12-07 ENCOUNTER — Other Ambulatory Visit: Payer: Self-pay

## 2019-12-07 VITALS — BP 154/82 | HR 62 | Resp 20 | Ht 62.0 in | Wt 192.0 lb

## 2019-12-07 DIAGNOSIS — M1990 Unspecified osteoarthritis, unspecified site: Secondary | ICD-10-CM

## 2019-12-07 DIAGNOSIS — L409 Psoriasis, unspecified: Secondary | ICD-10-CM

## 2019-12-07 DIAGNOSIS — D649 Anemia, unspecified: Secondary | ICD-10-CM | POA: Diagnosis not present

## 2019-12-07 DIAGNOSIS — R600 Localized edema: Secondary | ICD-10-CM | POA: Diagnosis not present

## 2019-12-07 DIAGNOSIS — K921 Melena: Secondary | ICD-10-CM

## 2019-12-07 DIAGNOSIS — L4050 Arthropathic psoriasis, unspecified: Secondary | ICD-10-CM

## 2019-12-07 HISTORY — DX: Arthropathic psoriasis, unspecified: L40.50

## 2019-12-07 HISTORY — DX: Unspecified osteoarthritis, unspecified site: M19.90

## 2019-12-07 HISTORY — DX: Psoriasis, unspecified: L40.9

## 2019-12-07 LAB — POC HEMOCCULT BLD/STL (OFFICE/1-CARD/DIAGNOSTIC): Fecal Occult Blood, POC: POSITIVE — AB

## 2019-12-07 MED ORDER — FUROSEMIDE 20 MG PO TABS: ORAL_TABLET | ORAL | 1 refills | Status: DC

## 2019-12-07 NOTE — Patient Instructions (Signed)
It was good to see you again today-  I will be in touch with your labs as soon as possible We will get you seen by GI asap to help [place] figure out your anemia.  I suspect this is coming from your GI system in some regard

## 2019-12-08 ENCOUNTER — Encounter: Payer: Self-pay | Admitting: Family Medicine

## 2019-12-08 DIAGNOSIS — J849 Interstitial pulmonary disease, unspecified: Secondary | ICD-10-CM | POA: Diagnosis not present

## 2019-12-08 DIAGNOSIS — J4540 Moderate persistent asthma, uncomplicated: Secondary | ICD-10-CM | POA: Diagnosis not present

## 2019-12-08 DIAGNOSIS — J984 Other disorders of lung: Secondary | ICD-10-CM | POA: Diagnosis not present

## 2019-12-08 LAB — CBC
HCT: 27 % — ABNORMAL LOW (ref 35.0–45.0)
Hemoglobin: 8.2 g/dL — ABNORMAL LOW (ref 11.7–15.5)
MCH: 24.5 pg — ABNORMAL LOW (ref 27.0–33.0)
MCHC: 30.4 g/dL — ABNORMAL LOW (ref 32.0–36.0)
MCV: 80.6 fL (ref 80.0–100.0)
MPV: 9.1 fL (ref 7.5–12.5)
Platelets: 367 10*3/uL (ref 140–400)
RBC: 3.35 10*6/uL — ABNORMAL LOW (ref 3.80–5.10)
RDW: 16.9 % — ABNORMAL HIGH (ref 11.0–15.0)
WBC: 5.8 10*3/uL (ref 3.8–10.8)

## 2019-12-08 LAB — FERRITIN: Ferritin: 35 ng/mL (ref 16–288)

## 2019-12-08 LAB — COMPREHENSIVE METABOLIC PANEL
AG Ratio: 1.5 (calc) (ref 1.0–2.5)
ALT: 15 U/L (ref 6–29)
AST: 14 U/L (ref 10–35)
Albumin: 4 g/dL (ref 3.6–5.1)
Alkaline phosphatase (APISO): 81 U/L (ref 37–153)
BUN: 14 mg/dL (ref 7–25)
CO2: 25 mmol/L (ref 20–32)
Calcium: 9.2 mg/dL (ref 8.6–10.4)
Chloride: 96 mmol/L — ABNORMAL LOW (ref 98–110)
Creat: 0.76 mg/dL (ref 0.60–0.93)
Globulin: 2.7 g/dL (calc) (ref 1.9–3.7)
Glucose, Bld: 97 mg/dL (ref 65–99)
Potassium: 4.8 mmol/L (ref 3.5–5.3)
Sodium: 130 mmol/L — ABNORMAL LOW (ref 135–146)
Total Bilirubin: 0.3 mg/dL (ref 0.2–1.2)
Total Protein: 6.7 g/dL (ref 6.1–8.1)

## 2019-12-08 LAB — BRAIN NATRIURETIC PEPTIDE: Brain Natriuretic Peptide: 144 pg/mL — ABNORMAL HIGH (ref <100)

## 2019-12-10 DIAGNOSIS — Z23 Encounter for immunization: Secondary | ICD-10-CM | POA: Diagnosis not present

## 2019-12-12 DIAGNOSIS — J984 Other disorders of lung: Secondary | ICD-10-CM | POA: Diagnosis not present

## 2019-12-12 DIAGNOSIS — J849 Interstitial pulmonary disease, unspecified: Secondary | ICD-10-CM | POA: Diagnosis not present

## 2019-12-12 DIAGNOSIS — J4540 Moderate persistent asthma, uncomplicated: Secondary | ICD-10-CM | POA: Diagnosis not present

## 2019-12-13 DIAGNOSIS — Z6832 Body mass index (BMI) 32.0-32.9, adult: Secondary | ICD-10-CM | POA: Diagnosis not present

## 2019-12-13 DIAGNOSIS — I890 Lymphedema, not elsewhere classified: Secondary | ICD-10-CM | POA: Diagnosis not present

## 2019-12-13 DIAGNOSIS — L4059 Other psoriatic arthropathy: Secondary | ICD-10-CM | POA: Diagnosis not present

## 2019-12-13 DIAGNOSIS — D509 Iron deficiency anemia, unspecified: Secondary | ICD-10-CM | POA: Diagnosis not present

## 2019-12-13 DIAGNOSIS — M2550 Pain in unspecified joint: Secondary | ICD-10-CM | POA: Diagnosis not present

## 2019-12-13 DIAGNOSIS — M150 Primary generalized (osteo)arthritis: Secondary | ICD-10-CM | POA: Diagnosis not present

## 2019-12-13 DIAGNOSIS — E669 Obesity, unspecified: Secondary | ICD-10-CM | POA: Diagnosis not present

## 2019-12-13 DIAGNOSIS — L409 Psoriasis, unspecified: Secondary | ICD-10-CM | POA: Diagnosis not present

## 2019-12-14 ENCOUNTER — Encounter: Payer: Self-pay | Admitting: Family Medicine

## 2019-12-14 DIAGNOSIS — D649 Anemia, unspecified: Secondary | ICD-10-CM

## 2019-12-14 DIAGNOSIS — D619 Aplastic anemia, unspecified: Secondary | ICD-10-CM

## 2019-12-15 DIAGNOSIS — J849 Interstitial pulmonary disease, unspecified: Secondary | ICD-10-CM | POA: Diagnosis not present

## 2019-12-15 DIAGNOSIS — J984 Other disorders of lung: Secondary | ICD-10-CM | POA: Diagnosis not present

## 2019-12-15 DIAGNOSIS — J4540 Moderate persistent asthma, uncomplicated: Secondary | ICD-10-CM | POA: Diagnosis not present

## 2019-12-15 NOTE — Telephone Encounter (Signed)
Attempted to reach patient to schedule appointment. Left message to return call to schedule lab appointment.

## 2019-12-16 ENCOUNTER — Other Ambulatory Visit (INDEPENDENT_AMBULATORY_CARE_PROVIDER_SITE_OTHER): Payer: Medicare Other

## 2019-12-16 ENCOUNTER — Other Ambulatory Visit: Payer: Self-pay

## 2019-12-16 DIAGNOSIS — D649 Anemia, unspecified: Secondary | ICD-10-CM

## 2019-12-16 LAB — CBC
HCT: 26.4 % — ABNORMAL LOW (ref 35.0–45.0)
Hemoglobin: 8 g/dL — ABNORMAL LOW (ref 11.7–15.5)
MCH: 24.5 pg — ABNORMAL LOW (ref 27.0–33.0)
MCHC: 30.3 g/dL — ABNORMAL LOW (ref 32.0–36.0)
MCV: 81 fL (ref 80.0–100.0)
MPV: 8.9 fL (ref 7.5–12.5)
Platelets: 307 10*3/uL (ref 140–400)
RBC: 3.26 10*6/uL — ABNORMAL LOW (ref 3.80–5.10)
RDW: 16.5 % — ABNORMAL HIGH (ref 11.0–15.0)
WBC: 4.4 10*3/uL (ref 3.8–10.8)

## 2019-12-17 ENCOUNTER — Encounter: Payer: Self-pay | Admitting: Family Medicine

## 2019-12-17 DIAGNOSIS — D649 Anemia, unspecified: Secondary | ICD-10-CM

## 2019-12-19 DIAGNOSIS — B0229 Other postherpetic nervous system involvement: Secondary | ICD-10-CM | POA: Diagnosis not present

## 2019-12-19 DIAGNOSIS — Z79899 Other long term (current) drug therapy: Secondary | ICD-10-CM | POA: Diagnosis not present

## 2019-12-19 DIAGNOSIS — Z5181 Encounter for therapeutic drug level monitoring: Secondary | ICD-10-CM | POA: Diagnosis not present

## 2019-12-19 DIAGNOSIS — I890 Lymphedema, not elsewhere classified: Secondary | ICD-10-CM | POA: Diagnosis not present

## 2019-12-19 DIAGNOSIS — M545 Low back pain, unspecified: Secondary | ICD-10-CM | POA: Diagnosis not present

## 2019-12-19 DIAGNOSIS — G894 Chronic pain syndrome: Secondary | ICD-10-CM | POA: Diagnosis not present

## 2019-12-20 ENCOUNTER — Telehealth: Payer: Self-pay | Admitting: Family

## 2019-12-20 ENCOUNTER — Encounter: Payer: Self-pay | Admitting: Family Medicine

## 2019-12-20 ENCOUNTER — Other Ambulatory Visit (INDEPENDENT_AMBULATORY_CARE_PROVIDER_SITE_OTHER): Payer: Medicare Other

## 2019-12-20 DIAGNOSIS — D649 Anemia, unspecified: Secondary | ICD-10-CM | POA: Diagnosis not present

## 2019-12-20 LAB — FECAL OCCULT BLOOD, IMMUNOCHEMICAL: Fecal Occult Bld: NEGATIVE

## 2019-12-20 NOTE — Telephone Encounter (Signed)
Called and spoke with patient regarding appointments scheduled.  She was in agreement with both date & time given.  New Patient packet was mailed to her as well

## 2019-12-20 NOTE — Telephone Encounter (Signed)
-----   Message from Eliezer Bottom, NP sent at 12/20/2019 12:53 PM EDT ----- Sure! Just put a referral in and we can get her set up for visit and then IV iron once we get her TIBC results.   Sarah ----- Message ----- From: Darreld Mclean, MD Sent: 12/20/2019  12:14 PM EDT To: Volanda Napoleon, MD, Eliezer Bottom, NP  Hi guys-I hope you are both doing well! This patient has seen your office at some point in the past, but I think it has been a while Question-she has anemia with hemoglobin 8-baseline 12, she seems to have a slow GI bleed. The plan is for her to see her GI doc and have upper and lower GI soon as possible  I suggested a possible blood transfusion. The patient is apprehensive about this, wondered if an iron infusion would be helpful. I thought in her case since her anemia is due to true blood loss iron might NOT be helpful-what do you guys think?  [place]

## 2019-12-21 DIAGNOSIS — I872 Venous insufficiency (chronic) (peripheral): Secondary | ICD-10-CM | POA: Diagnosis not present

## 2019-12-22 DIAGNOSIS — J984 Other disorders of lung: Secondary | ICD-10-CM | POA: Diagnosis not present

## 2019-12-22 DIAGNOSIS — J849 Interstitial pulmonary disease, unspecified: Secondary | ICD-10-CM | POA: Diagnosis not present

## 2019-12-22 DIAGNOSIS — J4540 Moderate persistent asthma, uncomplicated: Secondary | ICD-10-CM | POA: Diagnosis not present

## 2019-12-25 ENCOUNTER — Encounter: Payer: Self-pay | Admitting: Family Medicine

## 2019-12-25 DIAGNOSIS — D649 Anemia, unspecified: Secondary | ICD-10-CM

## 2019-12-26 ENCOUNTER — Telehealth: Payer: Self-pay | Admitting: Family Medicine

## 2019-12-26 DIAGNOSIS — J4540 Moderate persistent asthma, uncomplicated: Secondary | ICD-10-CM | POA: Diagnosis not present

## 2019-12-26 DIAGNOSIS — J984 Other disorders of lung: Secondary | ICD-10-CM | POA: Diagnosis not present

## 2019-12-26 DIAGNOSIS — J849 Interstitial pulmonary disease, unspecified: Secondary | ICD-10-CM | POA: Diagnosis not present

## 2019-12-26 NOTE — Telephone Encounter (Signed)
Called patient-got her scheduled to come in tomorrow for labs.

## 2019-12-26 NOTE — Telephone Encounter (Signed)
Patient states she spoke with Dr Lorelei Pont in reference to coming in to have her  hemoglobin check early this week .  Please advise

## 2019-12-26 NOTE — Telephone Encounter (Signed)
Please place lab orders as future for patient-she is requesting hemoglobin level checked.

## 2019-12-26 NOTE — Telephone Encounter (Signed)
Spoke with patient-got her scheduled tomorrow for labs.

## 2019-12-27 ENCOUNTER — Other Ambulatory Visit (INDEPENDENT_AMBULATORY_CARE_PROVIDER_SITE_OTHER): Payer: Medicare Other

## 2019-12-27 ENCOUNTER — Encounter: Payer: Self-pay | Admitting: Family Medicine

## 2019-12-27 ENCOUNTER — Other Ambulatory Visit: Payer: Self-pay

## 2019-12-27 DIAGNOSIS — D649 Anemia, unspecified: Secondary | ICD-10-CM

## 2019-12-27 LAB — CBC
HCT: 30.9 % — ABNORMAL LOW (ref 36.0–46.0)
Hemoglobin: 9.8 g/dL — ABNORMAL LOW (ref 12.0–15.0)
MCHC: 31.8 g/dL (ref 30.0–36.0)
MCV: 80.7 fl (ref 78.0–100.0)
Platelets: 318 10*3/uL (ref 150.0–400.0)
RBC: 3.83 Mil/uL — ABNORMAL LOW (ref 3.87–5.11)
RDW: 21.6 % — ABNORMAL HIGH (ref 11.5–15.5)
WBC: 4.9 10*3/uL (ref 4.0–10.5)

## 2019-12-27 NOTE — Telephone Encounter (Signed)
Already spoke with patient-please disregard previous message.

## 2019-12-28 ENCOUNTER — Encounter: Payer: Self-pay | Admitting: Family Medicine

## 2019-12-28 ENCOUNTER — Other Ambulatory Visit: Payer: Medicare Other

## 2019-12-28 DIAGNOSIS — J4540 Moderate persistent asthma, uncomplicated: Secondary | ICD-10-CM | POA: Diagnosis not present

## 2019-12-28 DIAGNOSIS — D500 Iron deficiency anemia secondary to blood loss (chronic): Secondary | ICD-10-CM | POA: Insufficient documentation

## 2019-12-28 DIAGNOSIS — K219 Gastro-esophageal reflux disease without esophagitis: Secondary | ICD-10-CM | POA: Diagnosis not present

## 2019-12-28 DIAGNOSIS — J984 Other disorders of lung: Secondary | ICD-10-CM | POA: Diagnosis not present

## 2019-12-28 DIAGNOSIS — D62 Acute posthemorrhagic anemia: Secondary | ICD-10-CM

## 2019-12-28 DIAGNOSIS — R195 Other fecal abnormalities: Secondary | ICD-10-CM | POA: Insufficient documentation

## 2019-12-28 DIAGNOSIS — J849 Interstitial pulmonary disease, unspecified: Secondary | ICD-10-CM | POA: Diagnosis not present

## 2019-12-28 HISTORY — DX: Other fecal abnormalities: R19.5

## 2019-12-28 HISTORY — DX: Iron deficiency anemia secondary to blood loss (chronic): D50.0

## 2019-12-29 DIAGNOSIS — J984 Other disorders of lung: Secondary | ICD-10-CM | POA: Diagnosis not present

## 2019-12-29 DIAGNOSIS — J849 Interstitial pulmonary disease, unspecified: Secondary | ICD-10-CM | POA: Diagnosis not present

## 2019-12-29 DIAGNOSIS — J4540 Moderate persistent asthma, uncomplicated: Secondary | ICD-10-CM | POA: Diagnosis not present

## 2020-01-02 DIAGNOSIS — J4540 Moderate persistent asthma, uncomplicated: Secondary | ICD-10-CM | POA: Diagnosis not present

## 2020-01-02 DIAGNOSIS — J984 Other disorders of lung: Secondary | ICD-10-CM | POA: Diagnosis not present

## 2020-01-02 DIAGNOSIS — J849 Interstitial pulmonary disease, unspecified: Secondary | ICD-10-CM | POA: Diagnosis not present

## 2020-01-03 ENCOUNTER — Encounter: Payer: Self-pay | Admitting: Family Medicine

## 2020-01-03 ENCOUNTER — Encounter (HOSPITAL_BASED_OUTPATIENT_CLINIC_OR_DEPARTMENT_OTHER): Payer: Self-pay | Admitting: *Deleted

## 2020-01-03 ENCOUNTER — Emergency Department (HOSPITAL_BASED_OUTPATIENT_CLINIC_OR_DEPARTMENT_OTHER)
Admission: EM | Admit: 2020-01-03 | Discharge: 2020-01-03 | Disposition: A | Discharge status: Home / Self Care | Payer: Medicare Other | Attending: Emergency Medicine | Admitting: Emergency Medicine

## 2020-01-03 ENCOUNTER — Emergency Department (HOSPITAL_BASED_OUTPATIENT_CLINIC_OR_DEPARTMENT_OTHER): Payer: Medicare Other

## 2020-01-03 ENCOUNTER — Other Ambulatory Visit: Payer: Self-pay

## 2020-01-03 DIAGNOSIS — J4540 Moderate persistent asthma, uncomplicated: Secondary | ICD-10-CM | POA: Insufficient documentation

## 2020-01-03 DIAGNOSIS — Z79899 Other long term (current) drug therapy: Secondary | ICD-10-CM | POA: Insufficient documentation

## 2020-01-03 DIAGNOSIS — R059 Cough, unspecified: Secondary | ICD-10-CM | POA: Diagnosis not present

## 2020-01-03 DIAGNOSIS — Z91040 Latex allergy status: Secondary | ICD-10-CM | POA: Insufficient documentation

## 2020-01-03 DIAGNOSIS — J069 Acute upper respiratory infection, unspecified: Secondary | ICD-10-CM | POA: Diagnosis not present

## 2020-01-03 DIAGNOSIS — I10 Essential (primary) hypertension: Secondary | ICD-10-CM | POA: Diagnosis not present

## 2020-01-03 DIAGNOSIS — Z7951 Long term (current) use of inhaled steroids: Secondary | ICD-10-CM | POA: Insufficient documentation

## 2020-01-03 DIAGNOSIS — K449 Diaphragmatic hernia without obstruction or gangrene: Secondary | ICD-10-CM | POA: Diagnosis not present

## 2020-01-03 DIAGNOSIS — Z20822 Contact with and (suspected) exposure to covid-19: Secondary | ICD-10-CM | POA: Insufficient documentation

## 2020-01-03 DIAGNOSIS — J029 Acute pharyngitis, unspecified: Secondary | ICD-10-CM | POA: Diagnosis not present

## 2020-01-03 LAB — RESPIRATORY PANEL BY RT PCR (FLU A&B, COVID)
Influenza A by PCR: NEGATIVE
Influenza B by PCR: NEGATIVE
SARS Coronavirus 2 by RT PCR: NEGATIVE

## 2020-01-03 MED ORDER — GUAIFENESIN-DM 100-10 MG/5ML PO SYRP: 5.0000 mL | ORAL_SOLUTION | Freq: Three times a day (TID) | ORAL | 0 refills | Status: DC | PRN

## 2020-01-03 NOTE — ED Triage Notes (Signed)
Cough and sore throat since last. Unknown Covid exposure.

## 2020-01-03 NOTE — ED Notes (Signed)
Review D/C papers with pt, reviewed Rx with pt, pt states understanding, pt denies questions at this time. 

## 2020-01-03 NOTE — ED Provider Notes (Signed)
[facility] EMERGENCY DEPARTMENT Provider Note   CSN: [id number] Arrival date & time: 01/03/20  1131     History Chief Complaint  Patient presents with  . Cough    Angela Gibbs is a 79 y.o. female.  HPI Patient presents with cough.  Began last night.  Denies sputum production but also states there is some clear to cloudy liquid coming out.  Also dull sore throat.  No known sick contacts.  Has had her Covid vaccinations.  Does have a history of what appears to be restrictive lung disease.  Sees Dr. [name].  No chest pain.  No swelling in her legs.  No fevers or chills.  No real nasal congestion.  States her voice is deeper than normal also.    Past Medical History:  Diagnosis Date  . Allergy   . Concussion   . Falls   . Head injury   . Herpes   . Hypertension   . Migraines   . Nerve damage   . Shingles     Patient Active Problem List   Diagnosis Date Noted  . Psoriatic arthritis ([place]) 12/07/2019  . Psoriasis 12/07/2019  . Osteoarthritis 12/07/2019  . TIA (transient ischemic attack) 05/18/2019  . Restrictive lung disease 05/13/2016  . Moderate persistent asthma without complication 20/94/7096  . Current use of beta blocker 04/11/2016  . Other allergic rhinitis 04/11/2016  . Gastroesophageal reflux disease without esophagitis 04/11/2016  . Essential hypertension 02/20/2016  . Abdominal pain, acute, right upper quadrant 07/29/2013    Past Surgical History:  Procedure Laterality Date  . BRAIN SURGERY    . CRANIOTOMY FOR EXTRACRANIAL-INTRACRANIAL BYPASS    . EYE SURGERY  4.4.17   milia excision  . HIP SURGERY     intracranial surgery  . NERVE SURGERY    . ROOT CANAL    . TONSILLECTOMY       OB History   No obstetric history on file.     Family History  Problem Relation Age of Onset  . Heart disease Father   . Heart attack Father     Social History   Tobacco Use  . Smoking status: Never Smoker  . Smokeless tobacco: Never Used    Substance Use Topics  . Alcohol use: Yes    Comment: social  . Drug use: No    Home Medications Prior to Admission medications   Medication Sig Start Date End Date Taking? Authorizing Provider  albuterol (VENTOLIN HFA) 108 (90 Base) MCG/ACT inhaler Inhale 1-2 puffs into the lungs every 6 (six) hours as needed for wheezing or shortness of breath. 09/26/19   Parrett, Fonnie Mu, NP  ALPRAZolam (XANAX) 0.25 MG tablet Take 1 tablet (0.25 mg total) by mouth 2 (two) times daily as needed for anxiety. 10/17/19   Copland, Gay Filler, MD  Ascorbic Acid (VITAMIN C) 100 MG tablet Take 1,000 mg by mouth daily. Reported on 05/24/2015    [provider]  atenolol (TENORMIN) 25 MG tablet Take 1 tablet (25 mg total) by mouth daily. 05/03/18   Copland, Gay Filler, MD  butalbital-acetaminophen-caffeine (FIORICET, ESGIC) 50-325-40 MG per tablet Take by mouth 2 (two) times daily as needed for headache.    [provider]  Cholecalciferol (D 5000) 5000 UNITS TABS daily at 0600.    [provider]  clopidogrel (PLAVIX) 75 MG tablet Take 1 tablet (75 mg total) by mouth daily. 05/19/19   Copland, Gay Filler, MD  co-enzyme Q-10 30 MG capsule Take 30  mg by mouth daily.    [provider]  diclofenac Sodium (VOLTAREN) 1 % GEL APPLY 2 GRAMS EXTERNALLY TO THE AFFECTED AREA FOUR TIMES DAILY 02/22/19   Mcarthur Rossetti, MD  erythromycin ophthalmic ointment 1 application.  05/22/15   [provider]  FLUoxetine (PROZAC) 20 MG tablet TAKE 1/2 TABLET EVERY DAY FOR 10 DAYS, THEN 1 TABLET EVERY DAY FOR 10 DAYS, THEN 1& 1/2 TABLET EVERY DAY 11/07/19   Copland, Gay Filler, MD  fluticasone (FLONASE) 50 MCG/ACT nasal spray Place 2 sprays into both nostrils daily. 04/11/16   Charlies Silvers, MD  furosemide (LASIX) 20 MG tablet TAKE 1 TABLET BY MOUTH EVERY DAY ON OCCASION AS NEEDED FOR SWELLING 12/07/19   Copland, Gay Filler, MD  guaiFENesin-dextromethorphan (ROBITUSSIN DM) 100-10 MG/5ML syrup Take 5  mLs by mouth 3 (three) times daily as needed for cough. 01/03/20   Davonna Belling, MD  lidocaine (LIDODERM) 5 % Place 1 patch onto the skin.     [provider]  nebivolol (BYSTOLIC) 10 MG tablet TAKE 1 TABLET(10 MG) BY MOUTH DAILY 11/28/19   Copland, Gay Filler, MD  omeprazole (PRILOSEC OTC) 20 MG tablet Take 30 mg by mouth daily.  12/10/10   [provider]  Prednicarbate 0.1 % CREA  03/15/12   [provider]  promethazine (PHENERGAN) 25 MG tablet Take 1 tablet (25 mg total) by mouth every 6 (six) hours as needed for nausea or vomiting. 04/16/13   Pollina, Gwenyth Allegra, MD  sodium chloride (V-R NASAL SPRAY SALINE) 0.65 % nasal spray Place 1 spray into the nose as needed. Use in both nostrils twice daily 06/11/16   Copland, Gay Filler, MD  valsartan (DIOVAN) 160 MG tablet Take 1.5 tablets (240 mg total) by mouth daily. 08/25/19   Copland, Gay Filler, MD    Allergies    Cephalosporins, Ciprofloxacin, Keflex [cephalexin], Penicillins, Sulfacetamide sodium, Xopenex [levalbuterol hcl], Pregabalin, Ropinirole hcl, Aspirin, Chromium, Erythromycin, Flu virus vaccine, Hydrochlorothiazide, Iodinated diagnostic agents, Lactose intolerance (gi), Latex, Lisinopril, Nitrofuran derivatives, Petrolatum, Shellfish allergy, Sulfa antibiotics, Troleandomycin, Zostavax [zoster vaccine live], Avelox [moxifloxacin hcl in nacl], Cefdinir, Eggs or egg-derived products, Levaquin [levofloxacin], Percodan [oxycodone-aspirin], and Prednisone  Review of Systems   Review of Systems  Constitutional: Negative for appetite change.  HENT: Positive for congestion and sore throat.   Respiratory: Positive for cough. Negative for shortness of breath.   Cardiovascular: Negative for chest pain.  Gastrointestinal: Negative for abdominal pain.  Genitourinary: Negative for flank pain.  Musculoskeletal: Negative for back pain.  Skin: Negative for rash.  Neurological: Negative for weakness.   Psychiatric/Behavioral: Negative for confusion.    Physical Exam Updated Vital Signs BP (!) 180/59 (BP Location: Right Arm)   Pulse 62   Temp 98.5 F (36.9 C) (Oral)   Resp 20   Ht 5\' 2"  (1.575 m)   Wt 87.1 kg   SpO2 97%   BMI 35.12 kg/m   Physical Exam Vitals and nursing note reviewed.  HENT:     Head: Normocephalic.     Right Ear: External ear normal.     Left Ear: External ear normal.     Mouth/Throat:     Pharynx: No oropharyngeal exudate or posterior oropharyngeal erythema.  Eyes:     Pupils: Pupils are equal, round, and reactive to light.  Cardiovascular:     Rate and Rhythm: Normal rate and regular rhythm.  Pulmonary:     Breath sounds: No wheezing or rales.  Abdominal:  Tenderness: There is no abdominal tenderness.  Musculoskeletal:        General: No tenderness.     Cervical back: Neck supple.  Skin:    General: Skin is warm.     Capillary Refill: Capillary refill takes less than 2 seconds.  Neurological:     Mental Status: She is alert and oriented to person, place, and time.  Psychiatric:        Mood and Affect: Mood normal.     ED Results / Procedures / Treatments   Labs (all labs ordered are listed, but only abnormal results are displayed) Labs Reviewed  RESPIRATORY PANEL BY RT PCR (FLU A&B, COVID)    EKG None  Radiology DG Chest Portable 1 View  Result Date: 01/03/2020 CLINICAL DATA:  Cough and sore throat since last evening. EXAM: PORTABLE CHEST 1 VIEW COMPARISON:  Chest CT 10/17/2019 FINDINGS: The cardiac silhouette, mediastinal and hilar contours are within normal limits given the patient's age and severe scoliosis. Large hiatal hernia again demonstrated. No acute pulmonary findings. IMPRESSION: No acute cardiopulmonary findings. Electronically Signed   By: Marijo Sanes M.D.   On: 01/03/2020 12:43    Procedures Procedures (including critical care time)  Medications Ordered in ED Medications - No data to display  ED Course  I  have reviewed the triage vital signs and the nursing notes.  Pertinent labs & imaging results that were available during my care of the patient were reviewed by me and considered in my medical decision making (see chart for details).    MDM Rules/Calculators/A&P                          Patient presents with cough and sore throat.  Began yesterday.  Minimal sputum production.  Has had Covid vaccines.  Does have history of restrictive lung disease and sees pulmonology.  Not in respiratory distress here.  Chest x-ray reassuring and negative Covid flu and RSV.  Discussed with patient and her husband about possibility of a false negative test but patient discharged home and will potentially get another test if symptoms continue.  Does have some mild throat pain but posterior pharynx is not erythematous.  Does not appear to need strep testing. Final Clinical Impression(s) / ED Diagnoses Final diagnoses:  Viral URI with cough    Rx / DC Orders ED Discharge Orders         Ordered    guaiFENesin-dextromethorphan (ROBITUSSIN DM) 100-10 MG/5ML syrup  3 times daily PRN        01/03/20 1251           Davonna Belling, MD 01/03/20 1308

## 2020-01-04 ENCOUNTER — Telehealth (INDEPENDENT_AMBULATORY_CARE_PROVIDER_SITE_OTHER): Payer: Medicare Other | Admitting: Family Medicine

## 2020-01-04 ENCOUNTER — Encounter: Payer: Self-pay | Admitting: Family Medicine

## 2020-01-04 DIAGNOSIS — J209 Acute bronchitis, unspecified: Secondary | ICD-10-CM

## 2020-01-04 MED ORDER — DOXYCYCLINE HYCLATE 100 MG PO CAPS: 100.0000 mg | ORAL_CAPSULE | Freq: Two times a day (BID) | ORAL | 0 refills | Status: DC

## 2020-01-04 NOTE — Progress Notes (Signed)
[facility] [place], [place], [place] [address] [phone]  Date:  01/04/2020   Name:  Angela Gibbs   DOB:  [birth year]   MRN:  [id number]  PCP:  Darreld Mclean, MD    Chief Complaint: Coughing (wheezing, shortness, went to ER-worsening, negative covid test) and Trouble Walking (arthritis, weakness, trouble walking-using crutches)   History of Present Illness:  Angela Gibbs is a 79 y.o. very pleasant female patient who presents with the following:  Virtual visit today for illness Pt location is home, provider location is office Patient identity confirmed with 2 factors, she gives consent for virtual visit today Last seen by myself about one month ago - history of TIA, restrictive lung disease and asthma, GERD, hypertension, psoriatic arthritis and psoriasis.  She is seeing Dr. Trudie Reed with rheumatology  She went to the ER yesterday with concern of cough- she had awoken early with cough and malaise Flu and covid negative Chest x-ray negative  She was reassured and released to home She notes a "rattely" cough She has not noted a fever Patient is concerned that she has bronchitis, notes that she typically requires antibiotics to get better She has a long list of medication allergies She has not noted diarrhea or rash  DG Chest Portable 1 View  Result Date: 01/03/2020 CLINICAL DATA:  Cough and sore throat since last evening. EXAM: PORTABLE CHEST 1 VIEW COMPARISON:  Chest CT 10/17/2019 FINDINGS: The cardiac silhouette, mediastinal and hilar contours are within normal limits given the patient's age and severe scoliosis. Large hiatal hernia again demonstrated. No acute pulmonary findings. IMPRESSION: No acute cardiopulmonary findings. Electronically Signed   By: Marijo Sanes M.D.   On: 01/03/2020 12:43   Discussed also the weakness she has noted- pt reports this is thought to be due to [employer] she has  tried recently.  They are going to stop using this and hopefully she will improve.  She will keep me posted about this issue  Patient Active Problem List   Diagnosis Date Noted  . Psoriatic arthritis ([place]) 12/07/2019  . Psoriasis 12/07/2019  . Osteoarthritis 12/07/2019  . TIA (transient ischemic attack) 05/18/2019  . Restrictive lung disease 05/13/2016  . Moderate persistent asthma without complication 94/76/5465  . Current use of beta blocker 04/11/2016  . Other allergic rhinitis 04/11/2016  . Gastroesophageal reflux disease without esophagitis 04/11/2016  . Essential hypertension 02/20/2016  . Abdominal pain, acute, right upper quadrant 07/29/2013    Past Medical History:  Diagnosis Date  . Allergy   . Concussion   . Falls   . Head injury   . Herpes   . Hypertension   . Migraines   . Nerve damage   . Shingles     Past Surgical History:  Procedure Laterality Date  . BRAIN SURGERY    . CRANIOTOMY FOR EXTRACRANIAL-INTRACRANIAL BYPASS    . EYE SURGERY  4.4.17   milia excision  . HIP SURGERY     intracranial surgery  . NERVE SURGERY    . ROOT CANAL    . TONSILLECTOMY      Social History   Tobacco Use  . Smoking status: Never Smoker  . Smokeless tobacco: Never Used  Substance Use Topics  . Alcohol use: Yes    Comment: social  . Drug use: No    Family History  Problem Relation Age of Onset  . Heart disease Father   .  Heart attack Father     Allergies  Allergen Reactions  . Cephalosporins Shortness Of Breath    Breathing difficulty  . Ciprofloxacin Hives and Shortness Of Breath  . Keflex [Cephalexin] Hives  . Penicillins Hives  . Sulfacetamide Sodium Hives  . Xopenex [Levalbuterol Hcl] Anxiety and Other (See Comments)    Tremors with violent body chattering and shaking  . Pregabalin Other (See Comments)  . Ropinirole Hcl Nausea And Vomiting  . Aspirin Other (See Comments)    Stomach pain/burning  . Chromium Other (See Comments)    Blisters  .  Erythromycin Other (See Comments)    "stomach issues"  . Flu Virus Vaccine Other (See Comments)    Numbness in legs  . Hydrochlorothiazide Other (See Comments)    Migraine  . Iodinated Diagnostic Agents     unknown unknown  . Lactose Intolerance (Gi) Other (See Comments)    "stomach issues"  . Latex   . Lisinopril Cough  . Nitrofuran Derivatives Hives, Itching and Swelling  . Petrolatum Nausea Only  . Shellfish Allergy Hives  . Sulfa Antibiotics Hives    Other reaction(s): Other (See Comments) Flank pain  . Troleandomycin Other (See Comments)    Unknown no longer available  . Zostavax [Zoster Vaccine Live] Other (See Comments)    Pt has been told by specialists not to receive vaccine d/t h/o herpes in bloodstream and shingles.  . Avelox [Moxifloxacin Hcl In Nacl] Nausea Only  . Cefdinir Nausea And Vomiting  . Eggs Or Egg-Derived Products     Per allergy testing, no known reaction. Pt states she was told she could probably eat 2-3 eggs weekly w/o issue.  . Levaquin [Levofloxacin] Hives, Palpitations and Other (See Comments)    Chest pain  . Percodan [Oxycodone-Aspirin] Nausea And Vomiting  . Prednisone Palpitations    No injections, can do the pills    Medication list has been reviewed and updated.  Current Outpatient Medications on File Prior to Visit  Medication Sig Dispense Refill  . albuterol (VENTOLIN HFA) 108 (90 Base) MCG/ACT inhaler Inhale 1-2 puffs into the lungs every 6 (six) hours as needed for wheezing or shortness of breath. 1 g 1  . ALPRAZolam (XANAX) 0.25 MG tablet Take 1 tablet (0.25 mg total) by mouth 2 (two) times daily as needed for anxiety. 30 tablet 1  . Ascorbic Acid (VITAMIN C) 100 MG tablet Take 1,000 mg by mouth daily. Reported on 05/24/2015    . atenolol (TENORMIN) 25 MG tablet Take 1 tablet (25 mg total) by mouth daily. 90 tablet 3  . butalbital-acetaminophen-caffeine (FIORICET, ESGIC) 50-325-40 MG per tablet Take by mouth 2 (two) times daily as  needed for headache.    . Cholecalciferol (D 5000) 5000 UNITS TABS daily at 0600.    . clopidogrel (PLAVIX) 75 MG tablet Take 1 tablet (75 mg total) by mouth daily. 30 tablet 3  . co-enzyme Q-10 30 MG capsule Take 30 mg by mouth daily.    . diclofenac Sodium (VOLTAREN) 1 % GEL APPLY 2 GRAMS EXTERNALLY TO THE AFFECTED AREA FOUR TIMES DAILY 100 g 0  . erythromycin ophthalmic ointment 1 application.     Marland Kitchen FLUoxetine (PROZAC) 20 MG tablet TAKE 1/2 TABLET EVERY DAY FOR 10 DAYS, THEN 1 TABLET EVERY DAY FOR 10 DAYS, THEN 1& 1/2 TABLET EVERY DAY 45 tablet 1  . fluticasone (FLONASE) 50 MCG/ACT nasal spray Place 2 sprays into both nostrils daily. 16 g 5  . furosemide (LASIX) 20 MG tablet  TAKE 1 TABLET BY MOUTH EVERY DAY ON OCCASION AS NEEDED FOR SWELLING 90 tablet 1  . guaiFENesin-dextromethorphan (ROBITUSSIN DM) 100-10 MG/5ML syrup Take 5 mLs by mouth 3 (three) times daily as needed for cough. 118 mL 0  . lidocaine (LIDODERM) 5 % Place 1 patch onto the skin.     Marland Kitchen nebivolol (BYSTOLIC) 10 MG tablet TAKE 1 TABLET(10 MG) BY MOUTH DAILY 90 tablet 1  . omeprazole (PRILOSEC OTC) 20 MG tablet Take 30 mg by mouth daily.     . Prednicarbate 0.1 % CREA     . promethazine (PHENERGAN) 25 MG tablet Take 1 tablet (25 mg total) by mouth every 6 (six) hours as needed for nausea or vomiting. 30 tablet 0  . sodium chloride (V-R NASAL SPRAY SALINE) 0.65 % nasal spray Place 1 spray into the nose as needed. Use in both nostrils twice daily 30 mL 6  . valsartan (DIOVAN) 160 MG tablet Take 1.5 tablets (240 mg total) by mouth daily. 135 tablet 3   No current facility-administered medications on file prior to visit.    Review of Systems:  As per HPI- otherwise negative.   Physical Examination: There were no vitals filed for this visit. There were no vitals filed for this visit. There is no height or weight on file to calculate BMI. Ideal Body Weight:    Spoke with pt on the phone today-she does not have video  capability She notes BP 170/60- did not take her BP meds yet today however She is coughing some, has chest congestion.  Does not sound in distress  Assessment and Plan: Acute bronchitis with COPD [facility])  Patient with concern of likely acute bronchitis She was seen yesterday at the ER, tested negative for flu and COVID-19.  Chest x-ray was reassuring We decided to add a course of doxycycline to her current regimen, she will let me know if not feeling better soon If getting worse, she will seek care right away  Telephone use for the entirety of visit today  Spoke to patient for about 10 minutes  Signed Lamar Blinks, MD

## 2020-01-09 ENCOUNTER — Telehealth: Payer: Self-pay

## 2020-01-09 ENCOUNTER — Other Ambulatory Visit: Payer: Self-pay | Admitting: Family

## 2020-01-09 ENCOUNTER — Encounter: Payer: Self-pay | Admitting: Family Medicine

## 2020-01-09 DIAGNOSIS — D649 Anemia, unspecified: Secondary | ICD-10-CM

## 2020-01-09 NOTE — Telephone Encounter (Signed)
Nurse Assessment Nurse: Raphael Gibney, RN, Vera Date/Time Eilene Ghazi Time): 01/07/2020 2:50:02 PM Confirm and document reason for call. If symptomatic, describe symptoms. ---Caller states she went to the ER on Tuesday. COVID test was negative. flu test was negative. Virtual appt with PCP on Wednesday afternoon and she is taking doxycycline. She is getting worse every day. she coughed all night. She is weak. Breathing is not worse than usual. pulse oximeter 98%. cough is deeper and dry. no fever. Does the patient have any new or worsening symptoms? ---Yes Will a triage be completed? ---Yes Related visit to physician within the last 2 weeks? ---Yes Does the PT have any chronic conditions? (i.e. diabetes, asthma, this includes High risk factors for pregnancy, etc.) ---Yes List chronic conditions. ---lung issue; psoriatic arthritis Is this a behavioral health or substance abuse call? ---No Guidelines Guideline Title Affirmed Question Affirmed Notes Nurse Date/Time (Eastern Time) Cough - Acute NonProductive Wheezing is present Raphael Gibney, Therapist, sports, Vanita Ingles 01/07/2020 2:57:46 PM Disp. Time Eilene Ghazi Time) Disposition Final User 01/07/2020 2:49:22 PM Send to Urgent Trisha Mangle, Dayna PLEASE NOTE: All timestamps contained within this report are represented as [place] Standard Time. CONFIDENTIALTY NOTICE: This fax transmission is intended only for the addressee. It contains information that is legally privileged, confidential or otherwise protected from use or disclosure. If you are not the intended recipient, you are strictly prohibited from reviewing, disclosing, copying using or disseminating any of this information or taking any action in reliance on or regarding this information. If you have received this fax in error, please notify us immediately by telephone so that we can arrange for its return to [place]. Phone: [phone], Toll-Free: [phone], Fax: [phone] Page: 2 of 2 Call Id:  [id number] 01/07/2020 3:03:32 PM See HCP within 4 Hours (or PCP triage) Yes Raphael Gibney, RN, Doreatha Lew Disagree/Comply Comply Caller Understands Yes PreDisposition Did not know what to do Care Advice Given Per Guideline SEE HCP (OR PCP TRIAGE) WITHIN 4 HOURS: * IF OFFICE WILL BE OPEN: You need to be seen within the next 3 or 4 hours. Call your doctor (or NP/PA) now or as soon as the office opens. CALL BACK IF: * You become worse Referrals GO TO FACILITY OTHER - SPECIFY

## 2020-01-09 NOTE — Telephone Encounter (Signed)
Call patient and left message on machine has no answer. We will send a MyChart message

## 2020-01-10 ENCOUNTER — Inpatient Hospital Stay (HOSPITAL_BASED_OUTPATIENT_CLINIC_OR_DEPARTMENT_OTHER): Payer: Medicare Other | Admitting: Family

## 2020-01-10 ENCOUNTER — Other Ambulatory Visit: Payer: Self-pay

## 2020-01-10 ENCOUNTER — Inpatient Hospital Stay: Payer: Medicare Other | Attending: Hematology & Oncology

## 2020-01-10 ENCOUNTER — Encounter: Payer: Self-pay | Admitting: Family

## 2020-01-10 ENCOUNTER — Telehealth: Payer: Self-pay

## 2020-01-10 VITALS — BP 154/53 | HR 63 | Temp 98.2°F | Ht 63.0 in | Wt 184.8 lb

## 2020-01-10 DIAGNOSIS — Z8249 Family history of ischemic heart disease and other diseases of the circulatory system: Secondary | ICD-10-CM | POA: Insufficient documentation

## 2020-01-10 DIAGNOSIS — R5383 Other fatigue: Secondary | ICD-10-CM | POA: Insufficient documentation

## 2020-01-10 DIAGNOSIS — Z885 Allergy status to narcotic agent status: Secondary | ICD-10-CM

## 2020-01-10 DIAGNOSIS — R0602 Shortness of breath: Secondary | ICD-10-CM | POA: Insufficient documentation

## 2020-01-10 DIAGNOSIS — Z881 Allergy status to other antibiotic agents status: Secondary | ICD-10-CM

## 2020-01-10 DIAGNOSIS — I10 Essential (primary) hypertension: Secondary | ICD-10-CM | POA: Insufficient documentation

## 2020-01-10 DIAGNOSIS — J984 Other disorders of lung: Secondary | ICD-10-CM | POA: Diagnosis not present

## 2020-01-10 DIAGNOSIS — D649 Anemia, unspecified: Secondary | ICD-10-CM

## 2020-01-10 DIAGNOSIS — R609 Edema, unspecified: Secondary | ICD-10-CM | POA: Insufficient documentation

## 2020-01-10 DIAGNOSIS — Z882 Allergy status to sulfonamides status: Secondary | ICD-10-CM

## 2020-01-10 DIAGNOSIS — Z888 Allergy status to other drugs, medicaments and biological substances status: Secondary | ICD-10-CM | POA: Diagnosis not present

## 2020-01-10 DIAGNOSIS — L4050 Arthropathic psoriasis, unspecified: Secondary | ICD-10-CM

## 2020-01-10 DIAGNOSIS — Z886 Allergy status to analgesic agent status: Secondary | ICD-10-CM | POA: Diagnosis not present

## 2020-01-10 DIAGNOSIS — Z79899 Other long term (current) drug therapy: Secondary | ICD-10-CM | POA: Diagnosis not present

## 2020-01-10 DIAGNOSIS — Z880 Allergy status to penicillin: Secondary | ICD-10-CM

## 2020-01-10 DIAGNOSIS — D509 Iron deficiency anemia, unspecified: Secondary | ICD-10-CM

## 2020-01-10 LAB — CMP (CANCER CENTER ONLY)
ALT: 18 U/L (ref 0–44)
AST: 18 U/L (ref 15–41)
Albumin: 3.9 g/dL (ref 3.5–5.0)
Alkaline Phosphatase: 66 U/L (ref 38–126)
Anion gap: 6 (ref 5–15)
BUN: 16 mg/dL (ref 8–23)
CO2: 28 mmol/L (ref 22–32)
Calcium: 9.4 mg/dL (ref 8.9–10.3)
Chloride: 93 mmol/L — ABNORMAL LOW (ref 98–111)
Creatinine: 0.72 mg/dL (ref 0.44–1.00)
GFR, Estimated: >60 mL/min (ref >60)
Glucose, Bld: 98 mg/dL (ref 70–99)
Potassium: 4.3 mmol/L (ref 3.5–5.1)
Sodium: 127 mmol/L — ABNORMAL LOW (ref 135–145)
Total Bilirubin: 0.2 mg/dL — ABNORMAL LOW (ref 0.3–1.2)
Total Protein: 7.2 g/dL (ref 6.5–8.1)

## 2020-01-10 LAB — RETICULOCYTES
Immature Retic Fract: 4.7 % (ref 2.3–15.9)
RBC.: 3.89 MIL/uL (ref 3.87–5.11)
Retic Count, Absolute: 70.8 10*3/uL (ref 19.0–186.0)
Retic Ct Pct: 1.8 % (ref 0.4–3.1)

## 2020-01-10 LAB — CBC WITH DIFFERENTIAL (CANCER CENTER ONLY)
Abs Immature Granulocytes: 0.02 10*3/uL (ref 0.00–0.07)
Basophils Absolute: 0.1 10*3/uL (ref 0.0–0.1)
Basophils Relative: 1 %
Eosinophils Absolute: 0.1 10*3/uL (ref 0.0–0.5)
Eosinophils Relative: 2 %
HCT: 32.5 % — ABNORMAL LOW (ref 36.0–46.0)
Hemoglobin: 10.3 g/dL — ABNORMAL LOW (ref 12.0–15.0)
Immature Granulocytes: 0 %
Lymphocytes Relative: 14 %
Lymphs Abs: 0.8 10*3/uL (ref 0.7–4.0)
MCH: 26.5 pg (ref 26.0–34.0)
MCHC: 31.7 g/dL (ref 30.0–36.0)
MCV: 83.5 fL (ref 80.0–100.0)
Monocytes Absolute: 0.9 10*3/uL (ref 0.1–1.0)
Monocytes Relative: 14 %
Neutro Abs: 4 10*3/uL (ref 1.7–7.7)
Neutrophils Relative %: 69 %
Platelet Count: 250 10*3/uL (ref 150–400)
RBC: 3.89 MIL/uL (ref 3.87–5.11)
RDW: 19.4 % — ABNORMAL HIGH (ref 11.5–15.5)
WBC Count: 5.9 10*3/uL (ref 4.0–10.5)
nRBC: 0 % (ref 0.0–0.2)

## 2020-01-10 LAB — SAVE SMEAR(SSMR), FOR PROVIDER SLIDE REVIEW

## 2020-01-10 LAB — LACTATE DEHYDROGENASE: LDH: 135 U/L (ref 98–192)

## 2020-01-10 LAB — SAMPLE TO BLOOD BANK

## 2020-01-10 NOTE — Progress Notes (Signed)
Hematology/Oncology Consultation   Name: Angela Gibbs      MRN: [id number]    Location: Room/bed info not found  Date: 01/10/2020 Time:1:28 PM   REFERRING PHYSICIAN: Lamar Blinks, MD  REASON FOR CONSULT: Anemia unspecified   DIAGNOSIS: Anemia unspecified  HISTORY OF PRESENT ILLNESS: [name] is a very pleasant 79 yo caucasian female with history of anemia over the last 2 years.  She had a positive hemoccult test in October and has an appointment to establish care with GI Dr. Marin Gibbs.  She has not noted any obvious blood loss.  She states that she is not on any type of anticoagulant or anti platelets therapy.  She was taking a baby aspirin but stopped as she has history of angioedema with this and also felt it may contribute to her GI blood loss.  She states that her last colonoscopy was 9 years ago and was negative.  Hgb is improved at 10.3, MCV 83, platelets 250 and WBC count 5.9.  She stopped the oral iron supplement to avoid GI upset.  She is currently symptomatic with fatigue and SOB with over exertion.  She is in pulmonary rehab for restrictive lung disease and is followed closely by Pulmonologist Dr. Rockwell Gibbs.  She was never a smoker.  No family history of anemia that she is aware of.  She has 2 adult children and history of 1 miscarriage at 9 weeks. States she still has female organs in place.  She is currently on doxycycline for bronchitis.  No fever, chills, n/v, cough, rash, dizziness, chest pain, palpitations, abdominal pain or changes in bowel or bladder habits.  She states that she just got her letter to remind her to schedule her mammogram which she plans to do. He mother had history of 3 mini strokes.  No diabetes or history of thyroid disease.  No personal or familial history of cancer.  She has swelling in her lower extremities which she attributes to the Angela Gibbs she was on for her psoriatic arthritis. She states that she stopped this a week ago due to the side  effects. She has contacted her rheumatologist and let them know.  She wears compression stockings to help reduce fluid retention.  She also noted easy bruising on the [place]poni Aria. No petechiae.  No numbness or tingling in her extremities.  No falls or syncopal episodes to report.  She has maintained a good appetite but admits that she needs to hydrate better throughout the day. She does eat meat. Weight is described as stable.   ROS: All other 10 point review of systems is negative.   PAST MEDICAL HISTORY:   Past Medical History:  Diagnosis Date  . Allergy   . Concussion   . Falls   . Head injury   . Herpes   . Hypertension   . Migraines   . Nerve damage   . Shingles     ALLERGIES: Allergies  Allergen Reactions  . Cephalosporins Shortness Of Breath    Breathing difficulty  . Ciprofloxacin Hives and Shortness Of Breath  . Keflex [Cephalexin] Hives  . Penicillins Hives  . Sulfacetamide Sodium Hives  . Xopenex [Levalbuterol Hcl] Anxiety and Other (See Comments)    Tremors with violent body chattering and shaking  . Pregabalin Other (See Comments)  . Ropinirole Hcl Nausea And Vomiting  . Aspirin Other (See Comments)    Stomach pain/burning  . Chromium Other (See Comments)    Blisters  . Erythromycin Other (See Comments)    "  stomach issues"  . Flu Virus Vaccine Other (See Comments)    Numbness in legs  . Hydrochlorothiazide Other (See Comments)    Migraine  . Iodinated Diagnostic Agents     unknown unknown  . Lactose Intolerance (Gi) Other (See Comments)    "stomach issues"  . Latex   . Lisinopril Cough  . Nitrofuran Derivatives Hives, Itching and Swelling  . Petrolatum Nausea Only  . Shellfish Allergy Hives  . Sulfa Antibiotics Hives    Other reaction(s): Other (See Comments) Flank pain  . Troleandomycin Other (See Comments)    Unknown no longer available  . Zostavax [Zoster Vaccine Live] Other (See Comments)    Pt has been told by specialists not to receive  vaccine d/t h/o herpes in bloodstream and shingles.  . Avelox [Moxifloxacin Hcl In Nacl] Nausea Only  . Cefdinir Nausea And Vomiting  . Eggs Or Egg-Derived Products     Per allergy testing, no known reaction. Pt states she was told she could probably eat 2-3 eggs weekly w/o issue.  . Levaquin [Levofloxacin] Hives, Palpitations and Other (See Comments)    Chest pain  . Percodan [Oxycodone-Aspirin] Nausea And Vomiting  . Prednisone Palpitations    No injections, can do the pills      MEDICATIONS:  Current Outpatient Medications on File Prior to Visit  Medication Sig Dispense Refill  . albuterol (VENTOLIN HFA) 108 (90 Base) MCG/ACT inhaler Inhale 1-2 puffs into the lungs every 6 (six) hours as needed for wheezing or shortness of breath. 1 g 1  . ALPRAZolam (XANAX) 0.25 MG tablet Take 1 tablet (0.25 mg total) by mouth 2 (two) times daily as needed for anxiety. 30 tablet 1  . Ascorbic Acid (VITAMIN C) 100 MG tablet Take 1,000 mg by mouth daily. Reported on 05/24/2015    . atenolol (TENORMIN) 25 MG tablet Take 1 tablet (25 mg total) by mouth daily. 90 tablet 3  . butalbital-acetaminophen-caffeine (FIORICET, ESGIC) 50-325-40 MG per tablet Take by mouth 2 (two) times daily as needed for headache.    . Cholecalciferol (D 5000) 5000 UNITS TABS daily at 0600.    . clopidogrel (PLAVIX) 75 MG tablet Take 1 tablet (75 mg total) by mouth daily. 30 tablet 3  . co-enzyme Q-10 30 MG capsule Take 30 mg by mouth daily.    . diclofenac Sodium (VOLTAREN) 1 % GEL APPLY 2 GRAMS EXTERNALLY TO THE AFFECTED AREA FOUR TIMES DAILY 100 g 0  . doxycycline (VIBRAMYCIN) 100 MG capsule Take 1 capsule (100 mg total) by mouth 2 (two) times daily. 20 capsule 0  . erythromycin ophthalmic ointment 1 application.     Marland Kitchen FLUoxetine (PROZAC) 20 MG tablet TAKE 1/2 TABLET EVERY DAY FOR 10 DAYS, THEN 1 TABLET EVERY DAY FOR 10 DAYS, THEN 1& 1/2 TABLET EVERY DAY 45 tablet 1  . fluticasone (FLONASE) 50 MCG/ACT nasal spray Place 2 sprays  into both nostrils daily. 16 g 5  . furosemide (LASIX) 20 MG tablet TAKE 1 TABLET BY MOUTH EVERY DAY ON OCCASION AS NEEDED FOR SWELLING 90 tablet 1  . guaiFENesin-dextromethorphan (ROBITUSSIN DM) 100-10 MG/5ML syrup Take 5 mLs by mouth 3 (three) times daily as needed for cough. 118 mL 0  . lidocaine (LIDODERM) 5 % Place 1 patch onto the skin.     Marland Kitchen nebivolol (BYSTOLIC) 10 MG tablet TAKE 1 TABLET(10 MG) BY MOUTH DAILY 90 tablet 1  . omeprazole (PRILOSEC OTC) 20 MG tablet Take 30 mg by mouth daily.     Marland Kitchen  Prednicarbate 0.1 % CREA     . promethazine (PHENERGAN) 25 MG tablet Take 1 tablet (25 mg total) by mouth every 6 (six) hours as needed for nausea or vomiting. 30 tablet 0  . sodium chloride (V-R NASAL SPRAY SALINE) 0.65 % nasal spray Place 1 spray into the nose as needed. Use in both nostrils twice daily 30 mL 6  . valsartan (DIOVAN) 160 MG tablet Take 1.5 tablets (240 mg total) by mouth daily. 135 tablet 3   No current facility-administered medications on file prior to visit.     PAST SURGICAL HISTORY Past Surgical History:  Procedure Laterality Date  . BRAIN SURGERY    . CRANIOTOMY FOR EXTRACRANIAL-INTRACRANIAL BYPASS    . EYE SURGERY  4.4.17   milia excision  . HIP SURGERY     intracranial surgery  . NERVE SURGERY    . ROOT CANAL    . TONSILLECTOMY      FAMILY HISTORY: Family History  Problem Relation Age of Onset  . Heart disease Father   . Heart attack Father     SOCIAL HISTORY:  reports that she has never smoked. She has never used smokeless tobacco. She reports current alcohol use. She reports that she does not use drugs.  PERFORMANCE STATUS: The patient's performance status is 1 - Symptomatic but completely ambulatory  PHYSICAL EXAM: Most Recent Vital Signs: There were no vitals taken for this visit. BP (!) 154/53 (BP Location: Left Arm, Patient Position: Sitting)   Pulse 63   Temp 98.2 F (36.8 C) (Oral)   Ht 5\' 3"  (1.6 m)   Wt 184 lb 12.8 oz (83.8 kg)   SpO2  100%   BMI 32.74 kg/m   General Appearance:    Alert, cooperative, no distress, appears stated age  Head:    Normocephalic, without obvious abnormality, atraumatic  Eyes:    PERRL, conjunctiva/corneas clear, EOM's intact, fundi    benign, both eyes        Throat:   Lips, mucosa, and tongue normal; teeth and gums normal  Neck:   Supple, symmetrical, trachea midline, no adenopathy;    thyroid:  no enlargement/tenderness/nodules; no carotid   bruit or JVD  Back:     Symmetric, no curvature, ROM normal, no CVA tenderness  Lungs:     Clear to auscultation bilaterally, respirations unlabored  Chest Wall:    No tenderness or deformity   Heart:    Regular rate and rhythm, S1 and S2 normal, no murmur, rub   or gallop     Abdomen:     Soft, non-tender, bowel sounds active all four quadrants,    no masses, no organomegaly        Extremities:   Extremities normal, atraumatic, no cyanosis or edema  Pulses:   2+ and symmetric all extremities  Skin:   Skin color, texture, turgor normal, no rashes or lesions  Lymph nodes:   Cervical, supraclavicular, and axillary nodes normal  Neurologic:   CNII-XII intact, normal strength, sensation and reflexes    throughout    LABORATORY DATA:  Results for orders placed or performed in visit on 01/10/20 (from the past 48 hour(s))  CBC with Differential ([facility] Only)     Status: Abnormal   Collection Time: 01/10/20  1:07 PM  Result Value Ref Range   WBC Count 5.9 4.0 - 10.5 K/uL   RBC 3.89 3.87 - 5.11 MIL/uL   Hemoglobin 10.3 (L) 12.0 - 15.0 g/dL   HCT 32.5 (L)  36 - 46 %   MCV 83.5 80.0 - 100.0 fL   MCH 26.5 26.0 - 34.0 pg   MCHC 31.7 30.0 - 36.0 g/dL   RDW 19.4 (H) 11.5 - 15.5 %   Platelet Count 250 150 - 400 K/uL   nRBC 0.0 0.0 - 0.2 %   Neutrophils Relative % 69 %   Neutro Abs 4.0 1.7 - 7.7 K/uL   Lymphocytes Relative 14 %   Lymphs Abs 0.8 0.7 - 4.0 K/uL   Monocytes Relative 14 %   Monocytes Absolute 0.9 0.1 - 1.0 K/uL   Eosinophils  Relative 2 %   Eosinophils Absolute 0.1 0.0 - 0.5 K/uL   Basophils Relative 1 %   Basophils Absolute 0.1 0.0 - 0.1 K/uL   Immature Granulocytes 0 %   Abs Immature Granulocytes 0.02 0.00 - 0.07 K/uL    [place]: Performed at [facility] at [facility], [address], [place], [place] [address]  Save Smear [facility])     Status: None   Collection Time: 01/10/20  1:07 PM  Result Value Ref Range   Smear Review SMEAR STAINED AND AVAILABLE FOR REVIEW     [place]: Performed at [facility] at [facility], [address] [place], [place] [address]  Reticulocytes     Status: None   Collection Time: 01/10/20  1:07 PM  Result Value Ref Range   Retic Ct Pct 1.8 0.4 - 3.1 %   RBC. 3.89 3.87 - 5.11 MIL/uL   Retic Count, Absolute 70.8 19.0 - 186.0 K/uL   Immature Retic Fract 4.7 2.3 - 15.9 %    [place]: Performed at [facility] at [facility], [address], [place], [place] [address]      RADIOGRAPHY: No results found.     PATHOLOGY: None  ASSESSMENT/PLAN: Ms. Hines is a very pleasant 79 yo caucasian female with history of anemia over the last 2 years.  We will see what her anemia panel reveals and get her set up for IV iron if needed.  Follow-up in 8 weeks.   All questions were answered. The patient knows to call the clinic with any problems, questions or concerns. We can certainly see the patient much sooner if necessary.  The patient was discussed with and also seen by Dr. Marin Olp and he is in agreement with the aforementioned.   [facility]

## 2020-01-10 NOTE — Telephone Encounter (Signed)
S/w pt- Appts made and printed-mailed to pt per 01/10/20 los... AOM

## 2020-01-11 ENCOUNTER — Telehealth: Payer: Self-pay | Admitting: Family

## 2020-01-11 ENCOUNTER — Other Ambulatory Visit: Payer: Self-pay | Admitting: Family

## 2020-01-11 DIAGNOSIS — D500 Iron deficiency anemia secondary to blood loss (chronic): Secondary | ICD-10-CM

## 2020-01-11 DIAGNOSIS — D509 Iron deficiency anemia, unspecified: Secondary | ICD-10-CM

## 2020-01-11 HISTORY — DX: Iron deficiency anemia, unspecified: D50.9

## 2020-01-11 LAB — FERRITIN: Ferritin: 52 ng/mL (ref 11–307)

## 2020-01-11 LAB — IRON AND TIBC
Iron: 26 ug/dL — ABNORMAL LOW (ref 41–142)
Saturation Ratios: 6 % — ABNORMAL LOW (ref 21–57)
TIBC: 421 ug/dL (ref 236–444)
UIBC: 395 ug/dL — ABNORMAL HIGH (ref 120–384)

## 2020-01-11 LAB — ERYTHROPOIETIN: Erythropoietin: 18.6 m[IU]/mL — ABNORMAL HIGH (ref 2.6–18.5)

## 2020-01-11 NOTE — Telephone Encounter (Signed)
I called and spoke with patient regarding appointments she was ok with both date/time per 11/24 sch msg

## 2020-01-13 ENCOUNTER — Other Ambulatory Visit: Payer: Self-pay | Admitting: Family Medicine

## 2020-01-13 DIAGNOSIS — G2581 Restless legs syndrome: Secondary | ICD-10-CM

## 2020-01-17 ENCOUNTER — Inpatient Hospital Stay: Payer: Medicare Other

## 2020-01-17 ENCOUNTER — Other Ambulatory Visit: Payer: Self-pay

## 2020-01-17 VITALS — BP 149/59 | HR 50 | Temp 97.7°F | Resp 18

## 2020-01-17 DIAGNOSIS — R5383 Other fatigue: Secondary | ICD-10-CM | POA: Diagnosis not present

## 2020-01-17 DIAGNOSIS — R0602 Shortness of breath: Secondary | ICD-10-CM | POA: Diagnosis not present

## 2020-01-17 DIAGNOSIS — J984 Other disorders of lung: Secondary | ICD-10-CM | POA: Diagnosis not present

## 2020-01-17 DIAGNOSIS — L4050 Arthropathic psoriasis, unspecified: Secondary | ICD-10-CM | POA: Diagnosis not present

## 2020-01-17 DIAGNOSIS — D500 Iron deficiency anemia secondary to blood loss (chronic): Secondary | ICD-10-CM

## 2020-01-17 DIAGNOSIS — R609 Edema, unspecified: Secondary | ICD-10-CM | POA: Diagnosis not present

## 2020-01-17 DIAGNOSIS — D649 Anemia, unspecified: Secondary | ICD-10-CM | POA: Diagnosis not present

## 2020-01-17 MED ORDER — SODIUM CHLORIDE 0.9 % IV SOLN
510.0000 mg | Freq: Once | INTRAVENOUS | Status: AC
  Administered 2020-01-17: 510 mg via INTRAVENOUS
  Filled 2020-01-17: qty 510

## 2020-01-17 MED ORDER — SODIUM CHLORIDE 0.9 % IV SOLN
Freq: Once | INTRAVENOUS | Status: AC
  Filled 2020-01-17: qty 250

## 2020-01-17 NOTE — Patient Instructions (Signed)

## 2020-01-24 ENCOUNTER — Inpatient Hospital Stay: Payer: Medicare Other | Attending: Hematology & Oncology

## 2020-01-24 ENCOUNTER — Other Ambulatory Visit: Payer: Self-pay

## 2020-01-24 VITALS — BP 155/62 | HR 52 | Temp 97.8°F | Resp 18

## 2020-01-24 DIAGNOSIS — D509 Iron deficiency anemia, unspecified: Secondary | ICD-10-CM | POA: Diagnosis not present

## 2020-01-24 DIAGNOSIS — D500 Iron deficiency anemia secondary to blood loss (chronic): Secondary | ICD-10-CM

## 2020-01-24 MED ORDER — SODIUM CHLORIDE 0.9 % IV SOLN
Freq: Once | INTRAVENOUS | Status: AC
  Filled 2020-01-24: qty 250

## 2020-01-24 MED ORDER — SODIUM CHLORIDE 0.9 % IV SOLN
510.0000 mg | Freq: Once | INTRAVENOUS | Status: AC
  Administered 2020-01-24: 510 mg via INTRAVENOUS
  Filled 2020-01-24: qty 510

## 2020-01-24 NOTE — Patient Instructions (Signed)

## 2020-01-25 DIAGNOSIS — J4540 Moderate persistent asthma, uncomplicated: Secondary | ICD-10-CM | POA: Diagnosis not present

## 2020-01-25 DIAGNOSIS — J849 Interstitial pulmonary disease, unspecified: Secondary | ICD-10-CM | POA: Diagnosis not present

## 2020-01-25 DIAGNOSIS — J984 Other disorders of lung: Secondary | ICD-10-CM | POA: Diagnosis not present

## 2020-02-01 DIAGNOSIS — J849 Interstitial pulmonary disease, unspecified: Secondary | ICD-10-CM | POA: Diagnosis not present

## 2020-02-01 DIAGNOSIS — J984 Other disorders of lung: Secondary | ICD-10-CM | POA: Diagnosis not present

## 2020-02-01 DIAGNOSIS — J4540 Moderate persistent asthma, uncomplicated: Secondary | ICD-10-CM | POA: Diagnosis not present

## 2020-02-02 DIAGNOSIS — J4540 Moderate persistent asthma, uncomplicated: Secondary | ICD-10-CM | POA: Diagnosis not present

## 2020-02-02 DIAGNOSIS — J984 Other disorders of lung: Secondary | ICD-10-CM | POA: Diagnosis not present

## 2020-02-02 DIAGNOSIS — J849 Interstitial pulmonary disease, unspecified: Secondary | ICD-10-CM | POA: Diagnosis not present

## 2020-02-06 ENCOUNTER — Encounter: Payer: Self-pay | Admitting: Family Medicine

## 2020-02-06 DIAGNOSIS — D649 Anemia, unspecified: Secondary | ICD-10-CM

## 2020-02-07 ENCOUNTER — Ambulatory Visit: Payer: Medicare Other | Admitting: Adult Health

## 2020-02-13 ENCOUNTER — Ambulatory Visit: Payer: Medicare Other | Admitting: Adult Health

## 2020-02-15 DIAGNOSIS — J849 Interstitial pulmonary disease, unspecified: Secondary | ICD-10-CM | POA: Diagnosis not present

## 2020-02-15 DIAGNOSIS — J984 Other disorders of lung: Secondary | ICD-10-CM | POA: Diagnosis not present

## 2020-02-15 DIAGNOSIS — J4540 Moderate persistent asthma, uncomplicated: Secondary | ICD-10-CM | POA: Diagnosis not present

## 2020-02-16 ENCOUNTER — Ambulatory Visit: Payer: Medicare Other | Admitting: Adult Health

## 2020-02-16 DIAGNOSIS — J849 Interstitial pulmonary disease, unspecified: Secondary | ICD-10-CM | POA: Diagnosis not present

## 2020-02-16 DIAGNOSIS — J984 Other disorders of lung: Secondary | ICD-10-CM | POA: Diagnosis not present

## 2020-02-16 DIAGNOSIS — J4540 Moderate persistent asthma, uncomplicated: Secondary | ICD-10-CM | POA: Diagnosis not present

## 2020-02-21 ENCOUNTER — Other Ambulatory Visit (INDEPENDENT_AMBULATORY_CARE_PROVIDER_SITE_OTHER): Payer: Medicare Other

## 2020-02-21 ENCOUNTER — Other Ambulatory Visit: Payer: Self-pay

## 2020-02-21 DIAGNOSIS — D649 Anemia, unspecified: Secondary | ICD-10-CM | POA: Diagnosis not present

## 2020-02-22 ENCOUNTER — Encounter: Payer: Self-pay | Admitting: Family Medicine

## 2020-02-22 DIAGNOSIS — J4540 Moderate persistent asthma, uncomplicated: Secondary | ICD-10-CM | POA: Diagnosis not present

## 2020-02-22 DIAGNOSIS — J984 Other disorders of lung: Secondary | ICD-10-CM | POA: Diagnosis not present

## 2020-02-22 DIAGNOSIS — I10 Essential (primary) hypertension: Secondary | ICD-10-CM | POA: Diagnosis not present

## 2020-02-22 DIAGNOSIS — J849 Interstitial pulmonary disease, unspecified: Secondary | ICD-10-CM | POA: Diagnosis not present

## 2020-02-22 DIAGNOSIS — R0600 Dyspnea, unspecified: Secondary | ICD-10-CM | POA: Diagnosis not present

## 2020-02-22 LAB — CBC
HCT: 38.2 % (ref 36.0–46.0)
Hemoglobin: 12.8 g/dL (ref 12.0–15.0)
MCHC: 33.7 g/dL (ref 30.0–36.0)
MCV: 88.4 fl (ref 78.0–100.0)
Platelets: 242 10*3/uL (ref 150.0–400.0)
RBC: 4.32 Mil/uL (ref 3.87–5.11)
RDW: 21.7 % — ABNORMAL HIGH (ref 11.5–15.5)
WBC: 4.3 10*3/uL (ref 4.0–10.5)

## 2020-02-23 DIAGNOSIS — J849 Interstitial pulmonary disease, unspecified: Secondary | ICD-10-CM | POA: Diagnosis not present

## 2020-02-23 DIAGNOSIS — R0600 Dyspnea, unspecified: Secondary | ICD-10-CM | POA: Diagnosis not present

## 2020-02-23 DIAGNOSIS — J984 Other disorders of lung: Secondary | ICD-10-CM | POA: Diagnosis not present

## 2020-02-23 DIAGNOSIS — J4540 Moderate persistent asthma, uncomplicated: Secondary | ICD-10-CM | POA: Diagnosis not present

## 2020-02-23 DIAGNOSIS — I10 Essential (primary) hypertension: Secondary | ICD-10-CM | POA: Diagnosis not present

## 2020-02-28 DIAGNOSIS — I872 Venous insufficiency (chronic) (peripheral): Secondary | ICD-10-CM | POA: Diagnosis not present

## 2020-02-28 DIAGNOSIS — I87391 Chronic venous hypertension (idiopathic) with other complications of right lower extremity: Secondary | ICD-10-CM | POA: Diagnosis not present

## 2020-02-29 DIAGNOSIS — R0600 Dyspnea, unspecified: Secondary | ICD-10-CM | POA: Diagnosis not present

## 2020-02-29 DIAGNOSIS — J4540 Moderate persistent asthma, uncomplicated: Secondary | ICD-10-CM | POA: Diagnosis not present

## 2020-02-29 DIAGNOSIS — J849 Interstitial pulmonary disease, unspecified: Secondary | ICD-10-CM | POA: Diagnosis not present

## 2020-02-29 DIAGNOSIS — J984 Other disorders of lung: Secondary | ICD-10-CM | POA: Diagnosis not present

## 2020-02-29 DIAGNOSIS — I10 Essential (primary) hypertension: Secondary | ICD-10-CM | POA: Diagnosis not present

## 2020-03-08 DIAGNOSIS — J4540 Moderate persistent asthma, uncomplicated: Secondary | ICD-10-CM | POA: Diagnosis not present

## 2020-03-08 DIAGNOSIS — J984 Other disorders of lung: Secondary | ICD-10-CM | POA: Diagnosis not present

## 2020-03-08 DIAGNOSIS — I10 Essential (primary) hypertension: Secondary | ICD-10-CM | POA: Diagnosis not present

## 2020-03-08 DIAGNOSIS — J849 Interstitial pulmonary disease, unspecified: Secondary | ICD-10-CM | POA: Diagnosis not present

## 2020-03-08 DIAGNOSIS — R0600 Dyspnea, unspecified: Secondary | ICD-10-CM | POA: Diagnosis not present

## 2020-03-12 DIAGNOSIS — J4540 Moderate persistent asthma, uncomplicated: Secondary | ICD-10-CM | POA: Diagnosis not present

## 2020-03-12 DIAGNOSIS — I10 Essential (primary) hypertension: Secondary | ICD-10-CM | POA: Diagnosis not present

## 2020-03-12 DIAGNOSIS — R0600 Dyspnea, unspecified: Secondary | ICD-10-CM | POA: Diagnosis not present

## 2020-03-12 DIAGNOSIS — J984 Other disorders of lung: Secondary | ICD-10-CM | POA: Diagnosis not present

## 2020-03-12 DIAGNOSIS — J849 Interstitial pulmonary disease, unspecified: Secondary | ICD-10-CM | POA: Diagnosis not present

## 2020-03-13 ENCOUNTER — Encounter: Payer: Self-pay | Admitting: Family

## 2020-03-13 ENCOUNTER — Other Ambulatory Visit: Payer: Self-pay

## 2020-03-13 ENCOUNTER — Inpatient Hospital Stay: Payer: Medicare Other | Attending: Hematology & Oncology

## 2020-03-13 ENCOUNTER — Inpatient Hospital Stay (HOSPITAL_BASED_OUTPATIENT_CLINIC_OR_DEPARTMENT_OTHER): Payer: Medicare Other | Admitting: Family

## 2020-03-13 VITALS — BP 143/55 | HR 55 | Temp 98.2°F | Resp 20 | Ht 63.0 in | Wt 180.8 lb

## 2020-03-13 DIAGNOSIS — Z880 Allergy status to penicillin: Secondary | ICD-10-CM | POA: Diagnosis not present

## 2020-03-13 DIAGNOSIS — Z882 Allergy status to sulfonamides status: Secondary | ICD-10-CM | POA: Insufficient documentation

## 2020-03-13 DIAGNOSIS — Z885 Allergy status to narcotic agent status: Secondary | ICD-10-CM | POA: Diagnosis not present

## 2020-03-13 DIAGNOSIS — K921 Melena: Secondary | ICD-10-CM | POA: Insufficient documentation

## 2020-03-13 DIAGNOSIS — R5383 Other fatigue: Secondary | ICD-10-CM | POA: Diagnosis not present

## 2020-03-13 DIAGNOSIS — D500 Iron deficiency anemia secondary to blood loss (chronic): Secondary | ICD-10-CM

## 2020-03-13 DIAGNOSIS — D509 Iron deficiency anemia, unspecified: Secondary | ICD-10-CM | POA: Diagnosis not present

## 2020-03-13 DIAGNOSIS — Z881 Allergy status to other antibiotic agents status: Secondary | ICD-10-CM | POA: Diagnosis not present

## 2020-03-13 DIAGNOSIS — Z79899 Other long term (current) drug therapy: Secondary | ICD-10-CM | POA: Insufficient documentation

## 2020-03-13 DIAGNOSIS — Z886 Allergy status to analgesic agent status: Secondary | ICD-10-CM | POA: Insufficient documentation

## 2020-03-13 DIAGNOSIS — R0602 Shortness of breath: Secondary | ICD-10-CM | POA: Insufficient documentation

## 2020-03-13 DIAGNOSIS — Z888 Allergy status to other drugs, medicaments and biological substances status: Secondary | ICD-10-CM | POA: Insufficient documentation

## 2020-03-13 LAB — CBC WITH DIFFERENTIAL (CANCER CENTER ONLY)
Abs Immature Granulocytes: 0.05 10*3/uL (ref 0.00–0.07)
Basophils Absolute: 0.1 10*3/uL (ref 0.0–0.1)
Basophils Relative: 1 %
Eosinophils Absolute: 0.2 10*3/uL (ref 0.0–0.5)
Eosinophils Relative: 4 %
HCT: 38.4 % (ref 36.0–46.0)
Hemoglobin: 13.4 g/dL (ref 12.0–15.0)
Immature Granulocytes: 1 %
Lymphocytes Relative: 17 %
Lymphs Abs: 0.9 10*3/uL (ref 0.7–4.0)
MCH: 30 pg (ref 26.0–34.0)
MCHC: 34.9 g/dL (ref 30.0–36.0)
MCV: 85.9 fL (ref 80.0–100.0)
Monocytes Absolute: 0.7 10*3/uL (ref 0.1–1.0)
Monocytes Relative: 13 %
Neutro Abs: 3.4 10*3/uL (ref 1.7–7.7)
Neutrophils Relative %: 64 %
Platelet Count: 213 10*3/uL (ref 150–400)
RBC: 4.47 MIL/uL (ref 3.87–5.11)
RDW: 16.4 % — ABNORMAL HIGH (ref 11.5–15.5)
WBC Count: 5.3 10*3/uL (ref 4.0–10.5)
nRBC: 0 % (ref 0.0–0.2)

## 2020-03-13 LAB — RETICULOCYTES
Immature Retic Fract: 4.8 % (ref 2.3–15.9)
RBC.: 4.42 MIL/uL (ref 3.87–5.11)
Retic Count, Absolute: 56.6 10*3/uL (ref 19.0–186.0)
Retic Ct Pct: 1.3 % (ref 0.4–3.1)

## 2020-03-13 NOTE — Progress Notes (Signed)
Hematology and Oncology Follow Up Visit  Angela Gibbs [id number] 08-13-1940 80 y.o. 03/13/2020   Principle Diagnosis:  Iron deficiency anemia  Current Therapy:   IV iron as indicated    Interim History:  Angela Gibbs is here today for follow-up. She is feeling much better since receiving IV iron.  She still has some fatigue and SOB with cardiac rehab. She has noticed that she now only has to ride the trolly in to therapy and is able to walk out on her own.  She has not noted any blood loss. No abnormal bruising, no petechiae.  She states that her gastroenterologist told her to let them know if she notes any blood in her stool or emesis. No colonoscopy or EGD at this time.  No fever, chills, n/v, cough, rash, dizziness, chest pain, palpitations, abdominal pain or changes in bowel or bladder habits.  No swelling, tenderness, numbness or tingling in her extremities.  No falls or syncope.  She has maintained a good appetite and is staying well hydrated. Her weight is stable.   ECOG Performance Status: 1 - Symptomatic but completely ambulatory  Medications:  Allergies as of 03/13/2020      Reactions   Cephalosporins Shortness Of Breath   Breathing difficulty   Ciprofloxacin Hives, Shortness Of Breath   Influenza Virus Vaccine Other (See Comments)   Numbness in legs   Keflex [cephalexin] Hives   Latex Hives, Itching, Rash   Levaquin [levofloxacin] Hives, Palpitations, Other (See Comments)   Chest pain   Nitrofuran Derivatives Hives, Itching, Swelling   Penicillins Hives   Sulfacetamide Sodium Hives   Xopenex [levalbuterol Hcl] Anxiety, Other (See Comments)   Tremors with violent body chattering and shaking   Zostavax [zoster Vaccine Live] Other (See Comments)   Pt has been told by specialists not to receive vaccine d/t h/o herpes in bloodstream and shingles.   Erythromycin Other (See Comments)   "stomach issues"   Hydrochlorothiazide Other (See Comments)   Migraine    Lisinopril Cough   Petrolatum Nausea Only   Pregabalin Other (See Comments)   Ropinirole Hcl Nausea And Vomiting   Shellfish Allergy Hives   Sulfa Antibiotics Hives   Aspirin Other (See Comments)   Stomach pain/burning   Avelox [moxifloxacin Hcl In Nacl] Nausea Only   Cefdinir Nausea And Vomiting   Chromium Other (See Comments)   Blisters   Eggs Or Egg-derived Products    Per allergy testing, no known reaction. Pt states she was told she could probably eat 2-3 eggs weekly w/o issue.   Iodinated Diagnostic Agents Other (See Comments)   unknown   Lactose Intolerance (gi) Other (See Comments)   "stomach issues"   Percodan [oxycodone-aspirin] Nausea And Vomiting   Prednisone Palpitations   No injections, can do the pills   Troleandomycin Other (See Comments)   Unknown reaction      Medication List       Accurate as of March 13, 2020  2:27 PM. If you have any questions, ask your nurse or doctor.        albuterol 108 (90 Base) MCG/ACT inhaler Commonly known as: VENTOLIN HFA Inhale 1-2 puffs into the lungs every 6 (six) hours as needed for wheezing or shortness of breath.   ALPRAZolam 0.25 MG tablet Commonly known as: XANAX Take 1 tablet (0.25 mg total) by mouth 2 (two) times daily as needed for anxiety.   atenolol 25 MG tablet Commonly known as: TENORMIN Take 1 tablet (25 mg total) by  mouth daily.   butalbital-acetaminophen-caffeine 50-325-40 MG tablet Commonly known as: FIORICET Take by mouth 2 (two) times daily as needed for headache.   Cholecalciferol 125 MCG (5000 UT) Tabs daily at 0600.   clopidogrel 75 MG tablet Commonly known as: PLAVIX Take 1 tablet (75 mg total) by mouth daily.   co-enzyme Q-10 30 MG capsule Take 30 mg by mouth daily.   diclofenac Sodium 1 % Gel Commonly known as: VOLTAREN APPLY 2 GRAMS EXTERNALLY TO THE AFFECTED AREA FOUR TIMES DAILY   doxycycline 100 MG capsule Commonly known as: VIBRAMYCIN Take 1 capsule (100 mg total) by mouth  2 (two) times daily.   erythromycin ophthalmic ointment 1 application.   FLUoxetine 20 MG tablet Commonly known as: PROZAC TAKE 1/2 TABLET EVERY DAY FOR 10 DAYS, THEN 1 TABLET EVERY DAY FOR 10 DAYS, THEN 1& 1/2 TABLET EVERY DAY   fluticasone 50 MCG/ACT nasal spray Commonly known as: FLONASE Place 2 sprays into both nostrils daily.   furosemide 20 MG tablet Commonly known as: LASIX TAKE 1 TABLET BY MOUTH EVERY DAY ON OCCASION AS NEEDED FOR SWELLING   guaiFENesin-dextromethorphan 100-10 MG/5ML syrup Commonly known as: ROBITUSSIN DM Take 5 mLs by mouth 3 (three) times daily as needed for cough.   lidocaine 5 % Commonly known as: LIDODERM Place 1 patch onto the skin.   nebivolol 10 MG tablet Commonly known as: BYSTOLIC TAKE 1 SEGBTD(17 MG) BY MOUTH DAILY   omeprazole 20 MG tablet Commonly known as: PRILOSEC OTC Take 30 mg by mouth daily.   Prednicarbate 0.1 % Crea   promethazine 25 MG tablet Commonly known as: PHENERGAN Take 1 tablet (25 mg total) by mouth every 6 (six) hours as needed for nausea or vomiting.   sodium chloride 0.65 % nasal spray Commonly known as: V-R NASAL SPRAY SALINE Place 1 spray into the nose as needed. Use in both nostrils twice daily   valsartan 160 MG tablet Commonly known as: DIOVAN Take 1.5 tablets (240 mg total) by mouth daily.   vitamin C 100 MG tablet Take 1,000 mg by mouth daily. Reported on 05/24/2015       Allergies:  Allergies  Allergen Reactions  . Cephalosporins Shortness Of Breath    Breathing difficulty  . Ciprofloxacin Hives and Shortness Of Breath  . Influenza Virus Vaccine Other (See Comments)    Numbness in legs  . Keflex [Cephalexin] Hives  . Latex Hives, Itching and Rash  . Levaquin [Levofloxacin] Hives, Palpitations and Other (See Comments)    Chest pain  . Nitrofuran Derivatives Hives, Itching and Swelling  . Penicillins Hives  . Sulfacetamide Sodium Hives  . Xopenex [Levalbuterol Hcl] Anxiety and Other (See  Comments)    Tremors with violent body chattering and shaking  . Zostavax [Zoster Vaccine Live] Other (See Comments)    Pt has been told by specialists not to receive vaccine d/t h/o herpes in bloodstream and shingles.  . Erythromycin Other (See Comments)    "stomach issues"  . Hydrochlorothiazide Other (See Comments)    Migraine  . Lisinopril Cough  . Petrolatum Nausea Only  . Pregabalin Other (See Comments)  . Ropinirole Hcl Nausea And Vomiting  . Shellfish Allergy Hives  . Sulfa Antibiotics Hives  . Aspirin Other (See Comments)    Stomach pain/burning  . Avelox [Moxifloxacin Hcl In Nacl] Nausea Only  . Cefdinir Nausea And Vomiting  . Chromium Other (See Comments)    Blisters  . Eggs Or Egg-Derived Products  Per allergy testing, no known reaction. Pt states she was told she could probably eat 2-3 eggs weekly w/o issue.  . Iodinated Diagnostic Agents Other (See Comments)    unknown   . Lactose Intolerance (Gi) Other (See Comments)    "stomach issues"  . Percodan [Oxycodone-Aspirin] Nausea And Vomiting  . Prednisone Palpitations    No injections, can do the pills  . Troleandomycin Other (See Comments)    Unknown reaction    Past Medical History, Surgical history, Social history, and Family History were reviewed and updated.  Review of Systems: All other 10 point review of systems is negative.   Physical Exam:  height is 5\' 3"  (1.6 m) and weight is 180 lb 12.8 oz (82 kg). Her oral temperature is 98.2 F (36.8 C). Her blood pressure is 143/55 (abnormal) and her pulse is 55 (abnormal). Her respiration is 20 and oxygen saturation is 100%.   Wt Readings from Last 3 Encounters:  03/13/20 180 lb 12.8 oz (82 kg)  01/10/20 184 lb 12.8 oz (83.8 kg)  01/03/20 192 lb 0.3 oz (87.1 kg)    Ocular: Sclerae unicteric, pupils equal, round and reactive to light Ear-nose-throat: Oropharynx clear, dentition fair Lymphatic: No cervical or supraclavicular adenopathy Lungs no rales or  rhonchi, good excursion bilaterally Heart regular rate and rhythm, no murmur appreciated Abd soft, nontender, positive bowel sounds MSK no focal spinal tenderness, no joint edema Neuro: non-focal, well-oriented, appropriate affect Breasts: Deferred   Lab Results  Component Value Date   WBC 5.3 03/13/2020   HGB 13.4 03/13/2020   HCT 38.4 03/13/2020   MCV 85.9 03/13/2020   PLT 213 03/13/2020   Lab Results  Component Value Date   FERRITIN 52 01/10/2020   IRON 26 (L) 01/10/2020   TIBC 421 01/10/2020   UIBC 395 (H) 01/10/2020   IRONPCTSAT 6 (L) 01/10/2020   Lab Results  Component Value Date   RETICCTPCT 1.3 03/13/2020   RBC 4.47 03/13/2020   RBC 4.42 03/13/2020   No results found for: KPAFRELGTCHN, LAMBDASER, KAPLAMBRATIO No results found for: IGGSERUM, IGA, IGMSERUM No results found for: Ronnald Ramp, A1GS, Gilford Silvius, MSPIKE, SPEI   Chemistry      Component Value Date/Time   NA 127 (L) 01/10/2020 1307   NA 134 (A) 11/18/2019 0000   K 4.3 01/10/2020 1307   CL 93 (L) 01/10/2020 1307   CO2 28 01/10/2020 1307   BUN 16 01/10/2020 1307   BUN 17 11/18/2019 0000   CREATININE 0.72 01/10/2020 1307   CREATININE 0.76 12/07/2019 1413   GLU 80 11/18/2019 0000      Component Value Date/Time   CALCIUM 9.4 01/10/2020 1307   ALKPHOS 66 01/10/2020 1307   AST 18 01/10/2020 1307   ALT 18 01/10/2020 1307   BILITOT 0.2 (L) 01/10/2020 1307       Impression and Plan: Ms. Detty is a very pleasant 80 yo caucasian female with history of anemia over the last 2 years and has done well with IV iron.  Iron studies are pending. We will replace again if needed.  Follow-up in 3 months.  She was encouraged to contact our office with any questions or concerns.   Laverna Peace, NP 1/25/20222:27 PM

## 2020-03-14 ENCOUNTER — Telehealth: Payer: Self-pay

## 2020-03-14 DIAGNOSIS — J849 Interstitial pulmonary disease, unspecified: Secondary | ICD-10-CM | POA: Diagnosis not present

## 2020-03-14 DIAGNOSIS — J4540 Moderate persistent asthma, uncomplicated: Secondary | ICD-10-CM | POA: Diagnosis not present

## 2020-03-14 DIAGNOSIS — I10 Essential (primary) hypertension: Secondary | ICD-10-CM | POA: Diagnosis not present

## 2020-03-14 DIAGNOSIS — J984 Other disorders of lung: Secondary | ICD-10-CM | POA: Diagnosis not present

## 2020-03-14 DIAGNOSIS — R0600 Dyspnea, unspecified: Secondary | ICD-10-CM | POA: Diagnosis not present

## 2020-03-14 LAB — IRON AND TIBC
Iron: 100 ug/dL (ref 41–142)
Saturation Ratios: 31 % (ref 21–57)
TIBC: 319 ug/dL (ref 236–444)
UIBC: 219 ug/dL (ref 120–384)

## 2020-03-14 LAB — FERRITIN: Ferritin: 218 ng/mL (ref 11–307)

## 2020-03-14 NOTE — Telephone Encounter (Signed)
No LOS at time of check out, per pt after los comes through place appts and she will view in my chart    Angela Gibbs

## 2020-03-15 DIAGNOSIS — J4540 Moderate persistent asthma, uncomplicated: Secondary | ICD-10-CM | POA: Diagnosis not present

## 2020-03-15 DIAGNOSIS — R0600 Dyspnea, unspecified: Secondary | ICD-10-CM | POA: Diagnosis not present

## 2020-03-15 DIAGNOSIS — I10 Essential (primary) hypertension: Secondary | ICD-10-CM | POA: Diagnosis not present

## 2020-03-15 DIAGNOSIS — J984 Other disorders of lung: Secondary | ICD-10-CM | POA: Diagnosis not present

## 2020-03-15 DIAGNOSIS — J849 Interstitial pulmonary disease, unspecified: Secondary | ICD-10-CM | POA: Diagnosis not present

## 2020-03-19 DIAGNOSIS — J984 Other disorders of lung: Secondary | ICD-10-CM | POA: Diagnosis not present

## 2020-03-19 DIAGNOSIS — I10 Essential (primary) hypertension: Secondary | ICD-10-CM | POA: Diagnosis not present

## 2020-03-19 DIAGNOSIS — J849 Interstitial pulmonary disease, unspecified: Secondary | ICD-10-CM | POA: Diagnosis not present

## 2020-03-19 DIAGNOSIS — J4540 Moderate persistent asthma, uncomplicated: Secondary | ICD-10-CM | POA: Diagnosis not present

## 2020-03-19 DIAGNOSIS — R0600 Dyspnea, unspecified: Secondary | ICD-10-CM | POA: Diagnosis not present

## 2020-03-21 DIAGNOSIS — J849 Interstitial pulmonary disease, unspecified: Secondary | ICD-10-CM | POA: Diagnosis not present

## 2020-03-21 DIAGNOSIS — E669 Obesity, unspecified: Secondary | ICD-10-CM | POA: Diagnosis not present

## 2020-03-21 DIAGNOSIS — J984 Other disorders of lung: Secondary | ICD-10-CM | POA: Diagnosis not present

## 2020-03-21 DIAGNOSIS — R0600 Dyspnea, unspecified: Secondary | ICD-10-CM | POA: Diagnosis not present

## 2020-03-29 ENCOUNTER — Telehealth: Payer: Self-pay | Admitting: *Deleted

## 2020-03-29 NOTE — Telephone Encounter (Signed)
[facility] [place], [place] sent over fax stating that Bystolic is not covered.   Insurance will cover: Atenolol-pt currently taking now  Carvedilol Labatelol hcl Metoprolol succinate-pt took from 08/21/16-11/01/16 Metoprolol tartrate Propranolol

## 2020-03-30 ENCOUNTER — Encounter: Payer: Self-pay | Admitting: Family Medicine

## 2020-04-02 DIAGNOSIS — J849 Interstitial pulmonary disease, unspecified: Secondary | ICD-10-CM | POA: Diagnosis not present

## 2020-04-02 DIAGNOSIS — R0600 Dyspnea, unspecified: Secondary | ICD-10-CM | POA: Diagnosis not present

## 2020-04-02 DIAGNOSIS — J984 Other disorders of lung: Secondary | ICD-10-CM | POA: Diagnosis not present

## 2020-04-02 DIAGNOSIS — E669 Obesity, unspecified: Secondary | ICD-10-CM | POA: Diagnosis not present

## 2020-04-03 ENCOUNTER — Telehealth: Payer: Self-pay

## 2020-04-03 NOTE — Telephone Encounter (Signed)
PA initiated via Covermymeds; KEY: [id number]. PA approved.   [id number];Review Type:Prior Auth;Coverage Start Date:03/04/2020;Coverage End Date:04/03/2021;

## 2020-04-04 DIAGNOSIS — J984 Other disorders of lung: Secondary | ICD-10-CM | POA: Diagnosis not present

## 2020-04-04 DIAGNOSIS — E669 Obesity, unspecified: Secondary | ICD-10-CM | POA: Diagnosis not present

## 2020-04-04 DIAGNOSIS — J849 Interstitial pulmonary disease, unspecified: Secondary | ICD-10-CM | POA: Diagnosis not present

## 2020-04-04 DIAGNOSIS — R0600 Dyspnea, unspecified: Secondary | ICD-10-CM | POA: Diagnosis not present

## 2020-04-05 DIAGNOSIS — E669 Obesity, unspecified: Secondary | ICD-10-CM | POA: Diagnosis not present

## 2020-04-05 DIAGNOSIS — J984 Other disorders of lung: Secondary | ICD-10-CM | POA: Diagnosis not present

## 2020-04-05 DIAGNOSIS — J849 Interstitial pulmonary disease, unspecified: Secondary | ICD-10-CM | POA: Diagnosis not present

## 2020-04-05 DIAGNOSIS — R0600 Dyspnea, unspecified: Secondary | ICD-10-CM | POA: Diagnosis not present

## 2020-04-11 DIAGNOSIS — J849 Interstitial pulmonary disease, unspecified: Secondary | ICD-10-CM | POA: Diagnosis not present

## 2020-04-11 DIAGNOSIS — J984 Other disorders of lung: Secondary | ICD-10-CM | POA: Diagnosis not present

## 2020-04-11 DIAGNOSIS — R0600 Dyspnea, unspecified: Secondary | ICD-10-CM | POA: Diagnosis not present

## 2020-04-11 DIAGNOSIS — E669 Obesity, unspecified: Secondary | ICD-10-CM | POA: Diagnosis not present

## 2020-04-16 DIAGNOSIS — R0600 Dyspnea, unspecified: Secondary | ICD-10-CM | POA: Diagnosis not present

## 2020-04-16 DIAGNOSIS — J849 Interstitial pulmonary disease, unspecified: Secondary | ICD-10-CM | POA: Diagnosis not present

## 2020-04-16 DIAGNOSIS — J984 Other disorders of lung: Secondary | ICD-10-CM | POA: Diagnosis not present

## 2020-04-16 DIAGNOSIS — E669 Obesity, unspecified: Secondary | ICD-10-CM | POA: Diagnosis not present

## 2020-04-17 ENCOUNTER — Telehealth: Payer: Self-pay

## 2020-04-17 ENCOUNTER — Other Ambulatory Visit: Payer: Self-pay | Admitting: *Deleted

## 2020-04-17 DIAGNOSIS — D649 Anemia, unspecified: Secondary | ICD-10-CM

## 2020-04-17 DIAGNOSIS — D500 Iron deficiency anemia secondary to blood loss (chronic): Secondary | ICD-10-CM

## 2020-04-17 DIAGNOSIS — D509 Iron deficiency anemia, unspecified: Secondary | ICD-10-CM

## 2020-04-17 NOTE — Telephone Encounter (Signed)
Called and left a vm for the pt to call for a lab only appt per sch message 04/17/20   Angela Gibbs

## 2020-04-18 ENCOUNTER — Other Ambulatory Visit: Payer: Self-pay

## 2020-04-18 ENCOUNTER — Inpatient Hospital Stay: Payer: Medicare Other | Attending: Hematology & Oncology

## 2020-04-18 DIAGNOSIS — D649 Anemia, unspecified: Secondary | ICD-10-CM

## 2020-04-18 DIAGNOSIS — D500 Iron deficiency anemia secondary to blood loss (chronic): Secondary | ICD-10-CM

## 2020-04-18 DIAGNOSIS — J849 Interstitial pulmonary disease, unspecified: Secondary | ICD-10-CM | POA: Diagnosis not present

## 2020-04-18 DIAGNOSIS — J4540 Moderate persistent asthma, uncomplicated: Secondary | ICD-10-CM | POA: Diagnosis not present

## 2020-04-18 DIAGNOSIS — J984 Other disorders of lung: Secondary | ICD-10-CM | POA: Diagnosis not present

## 2020-04-18 DIAGNOSIS — D509 Iron deficiency anemia, unspecified: Secondary | ICD-10-CM | POA: Diagnosis not present

## 2020-04-18 LAB — RETIC PANEL
Immature Retic Fract: 5.7 % (ref 2.3–15.9)
RBC.: 4.11 MIL/uL (ref 3.87–5.11)
Retic Count, Absolute: 72.7 10*3/uL (ref 19.0–186.0)
Retic Ct Pct: 1.8 % (ref 0.4–3.1)
Reticulocyte Hemoglobin: 37.3 pg (ref >27.9)

## 2020-04-18 LAB — CBC WITH DIFFERENTIAL (CANCER CENTER ONLY)
Abs Immature Granulocytes: 0.02 10*3/uL (ref 0.00–0.07)
Basophils Absolute: 0 10*3/uL (ref 0.0–0.1)
Basophils Relative: 1 %
Eosinophils Absolute: 0.2 10*3/uL (ref 0.0–0.5)
Eosinophils Relative: 4 %
HCT: 38.4 % (ref 36.0–46.0)
Hemoglobin: 13 g/dL (ref 12.0–15.0)
Immature Granulocytes: 0 %
Lymphocytes Relative: 17 %
Lymphs Abs: 0.9 10*3/uL (ref 0.7–4.0)
MCH: 30.7 pg (ref 26.0–34.0)
MCHC: 33.9 g/dL (ref 30.0–36.0)
MCV: 90.6 fL (ref 80.0–100.0)
Monocytes Absolute: 0.7 10*3/uL (ref 0.1–1.0)
Monocytes Relative: 14 %
Neutro Abs: 3.2 10*3/uL (ref 1.7–7.7)
Neutrophils Relative %: 64 %
Platelet Count: 225 10*3/uL (ref 150–400)
RBC: 4.24 MIL/uL (ref 3.87–5.11)
RDW: 13.8 % (ref 11.5–15.5)
WBC Count: 5.1 10*3/uL (ref 4.0–10.5)
nRBC: 0 % (ref 0.0–0.2)

## 2020-04-18 LAB — IRON AND TIBC
Iron: 82 ug/dL (ref 41–142)
Saturation Ratios: 25 % (ref 21–57)
TIBC: 326 ug/dL (ref 236–444)
UIBC: 244 ug/dL (ref 120–384)

## 2020-04-18 LAB — FERRITIN: Ferritin: 117 ng/mL (ref 11–307)

## 2020-04-19 DIAGNOSIS — J849 Interstitial pulmonary disease, unspecified: Secondary | ICD-10-CM | POA: Diagnosis not present

## 2020-04-19 DIAGNOSIS — J984 Other disorders of lung: Secondary | ICD-10-CM | POA: Diagnosis not present

## 2020-04-19 DIAGNOSIS — J4540 Moderate persistent asthma, uncomplicated: Secondary | ICD-10-CM | POA: Diagnosis not present

## 2020-06-12 ENCOUNTER — Telehealth: Payer: Self-pay

## 2020-06-12 ENCOUNTER — Inpatient Hospital Stay (HOSPITAL_BASED_OUTPATIENT_CLINIC_OR_DEPARTMENT_OTHER): Payer: Medicare Other | Admitting: Family

## 2020-06-12 ENCOUNTER — Encounter: Payer: Self-pay | Admitting: Family

## 2020-06-12 ENCOUNTER — Inpatient Hospital Stay: Payer: Medicare Other | Attending: Hematology & Oncology

## 2020-06-12 ENCOUNTER — Other Ambulatory Visit: Payer: Self-pay

## 2020-06-12 VITALS — BP 163/79 | HR 60 | Temp 98.0°F | Resp 18 | Ht 63.0 in | Wt 177.0 lb

## 2020-06-12 DIAGNOSIS — Z885 Allergy status to narcotic agent status: Secondary | ICD-10-CM | POA: Diagnosis not present

## 2020-06-12 DIAGNOSIS — Z79899 Other long term (current) drug therapy: Secondary | ICD-10-CM | POA: Diagnosis not present

## 2020-06-12 DIAGNOSIS — Z886 Allergy status to analgesic agent status: Secondary | ICD-10-CM | POA: Diagnosis not present

## 2020-06-12 DIAGNOSIS — D509 Iron deficiency anemia, unspecified: Secondary | ICD-10-CM | POA: Diagnosis not present

## 2020-06-12 DIAGNOSIS — Z887 Allergy status to serum and vaccine status: Secondary | ICD-10-CM | POA: Insufficient documentation

## 2020-06-12 DIAGNOSIS — D500 Iron deficiency anemia secondary to blood loss (chronic): Secondary | ICD-10-CM | POA: Diagnosis not present

## 2020-06-12 DIAGNOSIS — Z882 Allergy status to sulfonamides status: Secondary | ICD-10-CM | POA: Insufficient documentation

## 2020-06-12 DIAGNOSIS — R0602 Shortness of breath: Secondary | ICD-10-CM | POA: Diagnosis not present

## 2020-06-12 DIAGNOSIS — Z888 Allergy status to other drugs, medicaments and biological substances status: Secondary | ICD-10-CM | POA: Insufficient documentation

## 2020-06-12 DIAGNOSIS — Z881 Allergy status to other antibiotic agents status: Secondary | ICD-10-CM | POA: Insufficient documentation

## 2020-06-12 DIAGNOSIS — Z880 Allergy status to penicillin: Secondary | ICD-10-CM | POA: Insufficient documentation

## 2020-06-12 LAB — CBC WITH DIFFERENTIAL (CANCER CENTER ONLY)
Abs Immature Granulocytes: 0.06 10*3/uL (ref 0.00–0.07)
Basophils Absolute: 0 10*3/uL (ref 0.0–0.1)
Basophils Relative: 1 %
Eosinophils Absolute: 0.2 10*3/uL (ref 0.0–0.5)
Eosinophils Relative: 5 %
HCT: 36.3 % (ref 36.0–46.0)
Hemoglobin: 12.4 g/dL (ref 12.0–15.0)
Immature Granulocytes: 1 %
Lymphocytes Relative: 17 %
Lymphs Abs: 0.8 10*3/uL (ref 0.7–4.0)
MCH: 31.2 pg (ref 26.0–34.0)
MCHC: 34.2 g/dL (ref 30.0–36.0)
MCV: 91.4 fL (ref 80.0–100.0)
Monocytes Absolute: 0.7 10*3/uL (ref 0.1–1.0)
Monocytes Relative: 15 %
Neutro Abs: 2.9 10*3/uL (ref 1.7–7.7)
Neutrophils Relative %: 61 %
Platelet Count: 235 10*3/uL (ref 150–400)
RBC: 3.97 MIL/uL (ref 3.87–5.11)
RDW: 12 % (ref 11.5–15.5)
WBC Count: 4.7 10*3/uL (ref 4.0–10.5)
nRBC: 0 % (ref 0.0–0.2)

## 2020-06-12 LAB — RETICULOCYTES
Immature Retic Fract: 6.7 % (ref 2.3–15.9)
RBC.: 3.93 MIL/uL (ref 3.87–5.11)
Retic Count, Absolute: 87.6 10*3/uL (ref 19.0–186.0)
Retic Ct Pct: 2.2 % (ref 0.4–3.1)

## 2020-06-12 NOTE — Progress Notes (Signed)
Hematology and Oncology Follow Up Visit  JAIA ALONGE [id number] June 02, 1940 80 y.o. 06/12/2020   Principle Diagnosis:  Iron deficiency anemia  Current Therapy:        IV iron as indicated    Interim History:  Ms. Reedy is here today for follow-up. She is doing quite well and has no complaints at this time.  Hgb is stable at 12.4, MCV 91, WBC count 4.7 and platelets 235.  No blood loss noted. No bruising or petechiae.  No fever, chills, n/v, cough, rash, dizziness, chest pain, palpitations, abdominal pain or changes in bowel or bladder habits.  She has occasional SOB with over exertion and will take a break to rest when needed.  No swelling, tenderness, numbness or tingling in her extremities at this time. She has occasional puffiness in her ankles that comes and goes.  No falls or syncope.  She has maintained a good appetite and is staying well hydrated. Her weight is stable at 177 lbs.   ECOG Performance Status: 1 - Symptomatic but completely ambulatory  Medications:  Allergies as of 06/12/2020      Reactions   Cephalosporins Shortness Of Breath   Breathing difficulty   Ciprofloxacin Hives, Shortness Of Breath   Influenza Virus Vaccine Other (See Comments)   Numbness in legs   Keflex [cephalexin] Hives   Latex Hives, Itching, Rash   Levaquin [levofloxacin] Hives, Palpitations, Other (See Comments)   Chest pain   Nitrofuran Derivatives Hives, Itching, Swelling   Penicillins Hives   Sulfacetamide Sodium Hives   Xopenex [levalbuterol Hcl] Anxiety, Other (See Comments)   Tremors with violent body chattering and shaking   Zostavax [zoster Vaccine Live] Other (See Comments)   Pt has been told by specialists not to receive vaccine d/t h/o herpes in bloodstream and shingles.   Erythromycin Other (See Comments)   "stomach issues"   Hydrochlorothiazide Other (See Comments)   Migraine   Lisinopril Cough   Petrolatum Nausea Only   Pregabalin Other (See Comments)    Ropinirole Hcl Nausea And Vomiting   Shellfish Allergy Hives   Sulfa Antibiotics Hives   Aspirin Other (See Comments)   Stomach pain/burning   Avelox [moxifloxacin Hcl In Nacl] Nausea Only   Cefdinir Nausea And Vomiting   Chromium Other (See Comments)   Blisters   Eggs Or Egg-derived Products    Per allergy testing, no known reaction. Pt states she was told she could probably eat 2-3 eggs weekly w/o issue.   Iodinated Diagnostic Agents Other (See Comments)   unknown   Lactose Intolerance (gi) Other (See Comments)   "stomach issues"   Percodan [oxycodone-aspirin] Nausea And Vomiting   Prednisone Palpitations   No injections, can do the pills   Troleandomycin Other (See Comments)   Unknown reaction      Medication List       Accurate as of June 12, 2020  2:37 PM. If you have any questions, ask your nurse or doctor.        albuterol 108 (90 Base) MCG/ACT inhaler Commonly known as: VENTOLIN HFA Inhale 1-2 puffs into the lungs every 6 (six) hours as needed for wheezing or shortness of breath.   ALPRAZolam 0.25 MG tablet Commonly known as: XANAX Take 1 tablet (0.25 mg total) by mouth 2 (two) times daily as needed for anxiety.   atenolol 25 MG tablet Commonly known as: TENORMIN Take 1 tablet (25 mg total) by mouth daily.   butalbital-acetaminophen-caffeine 50-325-40 MG tablet Commonly known as: FIORICET  Take by mouth 2 (two) times daily as needed for headache.   Cholecalciferol 125 MCG (5000 UT) Tabs daily at 0600.   clindamycin 150 MG capsule Commonly known as: CLEOCIN SMARTSIG:4 Capsule(s) By Mouth   clopidogrel 75 MG tablet Commonly known as: PLAVIX Take 1 tablet (75 mg total) by mouth daily.   co-enzyme Q-10 30 MG capsule Take 30 mg by mouth daily.   cyclobenzaprine 5 MG tablet Commonly known as: FLEXERIL Take 5 mg by mouth 2 (two) times daily.   diclofenac Sodium 1 % Gel Commonly known as: VOLTAREN APPLY 2 GRAMS EXTERNALLY TO THE AFFECTED AREA FOUR  TIMES DAILY   doxycycline 100 MG capsule Commonly known as: VIBRAMYCIN Take 1 capsule (100 mg total) by mouth 2 (two) times daily.   erythromycin ophthalmic ointment 1 application.   FLUoxetine 20 MG tablet Commonly known as: PROZAC TAKE 1/2 TABLET EVERY DAY FOR 10 DAYS, THEN 1 TABLET EVERY DAY FOR 10 DAYS, THEN 1& 1/2 TABLET EVERY DAY   fluticasone 50 MCG/ACT nasal spray Commonly known as: FLONASE Place 2 sprays into both nostrils daily.   furosemide 20 MG tablet Commonly known as: LASIX TAKE 1 TABLET BY MOUTH EVERY DAY ON OCCASION AS NEEDED FOR SWELLING   guaiFENesin-dextromethorphan 100-10 MG/5ML syrup Commonly known as: ROBITUSSIN DM Take 5 mLs by mouth 3 (three) times daily as needed for cough.   HYDROcodone-acetaminophen 5-325 MG tablet Commonly known as: NORCO/VICODIN Take 1 tablet by mouth every 6 (six) hours as needed.   lidocaine 5 % Commonly known as: LIDODERM Place 1 patch onto the skin.   LORazepam 1 MG tablet Commonly known as: ATIVAN SMARTSIG:2 Tablet(s) By Mouth   nebivolol 10 MG tablet Commonly known as: BYSTOLIC TAKE 1 [id number] MG) BY MOUTH DAILY   nystatin powder Generic drug: nystatin Apply topically 2 (two) times daily.   omeprazole 20 MG tablet Commonly known as: PRILOSEC OTC Take 30 mg by mouth daily.   ondansetron 4 MG disintegrating tablet Commonly known as: ZOFRAN-ODT Take 2 mg by mouth every 6 (six) hours as needed.   Prednicarbate 0.1 % Crea   promethazine 25 MG tablet Commonly known as: PHENERGAN Take 1 tablet (25 mg total) by mouth every 6 (six) hours as needed for nausea or vomiting.   sodium chloride 0.65 % nasal spray Commonly known as: V-R NASAL SPRAY SALINE Place 1 spray into the nose as needed. Use in both nostrils twice daily   triamcinolone cream 0.1 % Commonly known as: KENALOG Apply topically 2 (two) times daily.   valsartan 160 MG tablet Commonly known as: DIOVAN Take 1.5 tablets (240 mg total) by mouth  daily.   vitamin C 100 MG tablet Take 1,000 mg by mouth daily. Reported on 05/24/2015       Allergies:  Allergies  Allergen Reactions  . Cephalosporins Shortness Of Breath    Breathing difficulty  . Ciprofloxacin Hives and Shortness Of Breath  . Influenza Virus Vaccine Other (See Comments)    Numbness in legs  . Keflex [Cephalexin] Hives  . Latex Hives, Itching and Rash  . Levaquin [Levofloxacin] Hives, Palpitations and Other (See Comments)    Chest pain  . Nitrofuran Derivatives Hives, Itching and Swelling  . Penicillins Hives  . Sulfacetamide Sodium Hives  . Xopenex [Levalbuterol Hcl] Anxiety and Other (See Comments)    Tremors with violent body chattering and shaking  . Zostavax [Zoster Vaccine Live] Other (See Comments)    Pt has been told by specialists not to receive  vaccine d/t h/o herpes in bloodstream and shingles.  . Erythromycin Other (See Comments)    "stomach issues"  . Hydrochlorothiazide Other (See Comments)    Migraine  . Lisinopril Cough  . Petrolatum Nausea Only  . Pregabalin Other (See Comments)  . Ropinirole Hcl Nausea And Vomiting  . Shellfish Allergy Hives  . Sulfa Antibiotics Hives  . Aspirin Other (See Comments)    Stomach pain/burning  . Avelox [Moxifloxacin Hcl In Nacl] Nausea Only  . Cefdinir Nausea And Vomiting  . Chromium Other (See Comments)    Blisters  . Eggs Or Egg-Derived Products     Per allergy testing, no known reaction. Pt states she was told she could probably eat 2-3 eggs weekly w/o issue.  . Iodinated Diagnostic Agents Other (See Comments)    unknown   . Lactose Intolerance (Gi) Other (See Comments)    "stomach issues"  . Percodan [Oxycodone-Aspirin] Nausea And Vomiting  . Prednisone Palpitations    No injections, can do the pills  . Troleandomycin Other (See Comments)    Unknown reaction    Past Medical History, Surgical history, Social history, and Family History were reviewed and updated.  Review of Systems: All  other 10 point review of systems is negative.   Physical Exam:  height is 5\' 3"  (1.6 m) and weight is 177 lb (80.3 kg). Her oral temperature is 98 F (36.7 C). Her blood pressure is 163/79 (abnormal) and her pulse is 60. Her respiration is 18 and oxygen saturation is 100%.   Wt Readings from Last 3 Encounters:  06/12/20 177 lb (80.3 kg)  03/13/20 180 lb 12.8 oz (82 kg)  01/10/20 184 lb 12.8 oz (83.8 kg)    Ocular: Sclerae unicteric, pupils equal, round and reactive to light Ear-nose-throat: Oropharynx clear, dentition fair Lymphatic: No cervical or supraclavicular adenopathy Lungs no rales or rhonchi, good excursion bilaterally Heart regular rate and rhythm, no murmur appreciated Abd soft, nontender, positive bowel sounds MSK no focal spinal tenderness, no joint edema Neuro: non-focal, well-oriented, appropriate affect Breasts: Deferred   Lab Results  Component Value Date   WBC 4.7 06/12/2020   HGB 12.4 06/12/2020   HCT 36.3 06/12/2020   MCV 91.4 06/12/2020   PLT 235 06/12/2020   Lab Results  Component Value Date   FERRITIN 117 04/18/2020   IRON 82 04/18/2020   TIBC 326 04/18/2020   UIBC 244 04/18/2020   IRONPCTSAT 25 04/18/2020   Lab Results  Component Value Date   RETICCTPCT 2.2 06/12/2020   RBC 3.93 06/12/2020   No results found for: KPAFRELGTCHN, LAMBDASER, KAPLAMBRATIO No results found for: IGGSERUM, IGA, IGMSERUM No results found for: Kathrynn Ducking, MSPIKE, SPEI   Chemistry      Component Value Date/Time   NA 127 (L) 01/10/2020 1307   NA 134 (A) 11/18/2019 0000   K 4.3 01/10/2020 1307   CL 93 (L) 01/10/2020 1307   CO2 28 01/10/2020 1307   BUN 16 01/10/2020 1307   BUN 17 11/18/2019 0000   CREATININE 0.72 01/10/2020 1307   CREATININE 0.76 12/07/2019 1413   GLU 80 11/18/2019 0000      Component Value Date/Time   CALCIUM 9.4 01/10/2020 1307   ALKPHOS 66 01/10/2020 1307   AST 18 01/10/2020 1307   ALT 18  01/10/2020 1307   BILITOT 0.2 (L) 01/10/2020 1307       Impression and Plan: Ms. Adan is a very pleasant 8 yocaucasian female with history of  anemia over the last 2 years and has done well with IV iron.  Iron studies are pending. We will replace if needed.  Follow-up in 6 months.  She can contact our office with any questions or concerns.   Laverna Peace, NP 4/26/20222:37 PM

## 2020-06-12 NOTE — Telephone Encounter (Signed)
appts made and printed for pt per 06/12/20 los  [employer]

## 2020-06-13 LAB — IRON AND TIBC
Iron: 96 ug/dL (ref 41–142)
Saturation Ratios: 26 % (ref 21–57)
TIBC: 370 ug/dL (ref 236–444)
UIBC: 274 ug/dL (ref 120–384)

## 2020-06-13 LAB — FERRITIN: Ferritin: 46 ng/mL (ref 11–307)

## 2020-06-29 ENCOUNTER — Telehealth: Payer: Self-pay | Admitting: Family Medicine

## 2020-06-29 DIAGNOSIS — I10 Essential (primary) hypertension: Secondary | ICD-10-CM

## 2020-06-29 MED ORDER — NEBIVOLOL HCL 10 MG PO TABS
ORAL_TABLET | ORAL | 1 refills | Status: DC
Start: 2020-06-29 — End: 2020-12-24

## 2020-06-29 NOTE — Telephone Encounter (Signed)
Medication refilled to pharmacy.  

## 2020-06-29 NOTE — Telephone Encounter (Signed)
Medication: nebivolol (BYSTOLIC) 10 MG tablet [[id number]]      Has the patient contacted their pharmacy? yes (If no, request that the patient contact the pharmacy for the refill.) (If yes, when and what did the pharmacy advise?) Waiting for doc response    Preferred Pharmacy (with phone number or street name):  [id number] - [place], [address]  [place], [place] [address]  Phone:  [phone] Fax:  [phone]     Agent: Please be advised that RX refills may take up to 3 business days. We ask that you follow-up with your pharmacy.

## 2020-07-13 DIAGNOSIS — R600 Localized edema: Secondary | ICD-10-CM | POA: Diagnosis not present

## 2020-07-13 DIAGNOSIS — M150 Primary generalized (osteo)arthritis: Secondary | ICD-10-CM | POA: Diagnosis not present

## 2020-07-13 DIAGNOSIS — Z6831 Body mass index (BMI) 31.0-31.9, adult: Secondary | ICD-10-CM | POA: Diagnosis not present

## 2020-07-13 DIAGNOSIS — L4059 Other psoriatic arthropathy: Secondary | ICD-10-CM | POA: Diagnosis not present

## 2020-07-13 DIAGNOSIS — M2550 Pain in unspecified joint: Secondary | ICD-10-CM | POA: Diagnosis not present

## 2020-07-13 DIAGNOSIS — E669 Obesity, unspecified: Secondary | ICD-10-CM | POA: Diagnosis not present

## 2020-07-13 DIAGNOSIS — L409 Psoriasis, unspecified: Secondary | ICD-10-CM | POA: Diagnosis not present

## 2020-07-25 ENCOUNTER — Other Ambulatory Visit: Payer: Self-pay | Admitting: Family Medicine

## 2020-07-25 DIAGNOSIS — I10 Essential (primary) hypertension: Secondary | ICD-10-CM

## 2020-08-16 DIAGNOSIS — G894 Chronic pain syndrome: Secondary | ICD-10-CM | POA: Diagnosis not present

## 2020-08-16 DIAGNOSIS — M545 Low back pain, unspecified: Secondary | ICD-10-CM | POA: Diagnosis not present

## 2020-08-16 DIAGNOSIS — B0229 Other postherpetic nervous system involvement: Secondary | ICD-10-CM | POA: Diagnosis not present

## 2020-08-16 DIAGNOSIS — M62838 Other muscle spasm: Secondary | ICD-10-CM | POA: Diagnosis not present

## 2020-08-16 DIAGNOSIS — Z5181 Encounter for therapeutic drug level monitoring: Secondary | ICD-10-CM | POA: Diagnosis not present

## 2020-08-16 DIAGNOSIS — Z79899 Other long term (current) drug therapy: Secondary | ICD-10-CM | POA: Diagnosis not present

## 2020-08-16 DIAGNOSIS — G8929 Other chronic pain: Secondary | ICD-10-CM | POA: Diagnosis not present

## 2020-08-16 DIAGNOSIS — G4489 Other headache syndrome: Secondary | ICD-10-CM | POA: Diagnosis not present

## 2020-08-16 DIAGNOSIS — M25551 Pain in right hip: Secondary | ICD-10-CM | POA: Diagnosis not present

## 2020-09-28 ENCOUNTER — Other Ambulatory Visit: Payer: Self-pay | Admitting: Family Medicine

## 2020-10-09 ENCOUNTER — Ambulatory Visit (INDEPENDENT_AMBULATORY_CARE_PROVIDER_SITE_OTHER): Payer: Medicare Other

## 2020-10-09 VITALS — Ht 62.0 in | Wt 173.0 lb

## 2020-10-09 DIAGNOSIS — Z0000 Encounter for general adult medical examination without abnormal findings: Secondary | ICD-10-CM | POA: Diagnosis not present

## 2020-10-09 NOTE — Patient Instructions (Signed)
[name] , Thank you for taking time to complete your Medicare Wellness Visit. I appreciate your ongoing commitment to your health goals. Please review the following plan we discussed and let me know if I can assist you in the future.   Screening recommendations/referrals: Colonoscopy: No longer required Mammogram: Due-Per our conversation, You will have GYN order. Bone Density: Due-Per our conversation, You will have GYN order. Recommended yearly ophthalmology/optometry visit for glaucoma screening and checkup Recommended yearly dental visit for hygiene and checkup  Vaccinations: Influenza vaccine: Allergic Pneumococcal vaccine: Up to date Tdap vaccine: Up to date-Due-06/12/2026 Shingles vaccine: Allergic   Covid-19:Up to date  Advanced directives: Please bring a copy for your chart if available  Conditions/risks identified: See problem list  Next appointment: Follow up in one year for your annual wellness visit    Preventive Care 65 Years and Older, Female Preventive care refers to lifestyle choices and visits with your health care provider that can promote health and wellness. What does preventive care include? A yearly physical exam. This is also called an annual well check. Dental exams once or twice a year. Routine eye exams. Ask your health care provider how often you should have your eyes checked. Personal lifestyle choices, including: Daily care of your teeth and gums. Regular physical activity. Eating a healthy diet. Avoiding tobacco and drug use. Limiting alcohol use. Practicing safe sex. Taking low-dose aspirin every day. Taking vitamin and mineral supplements as recommended by your health care provider. What happens during an annual well check? The services and screenings done by your health care provider during your annual well check will depend on your age, overall health, lifestyle risk factors, and family history of disease. Counseling  Your health care  provider may ask you questions about your: Alcohol use. Tobacco use. Drug use. Emotional well-being. Home and relationship well-being. Sexual activity. Eating habits. History of falls. Memory and ability to understand (cognition). Work and work Statistician. Reproductive health. Screening  You may have the following tests or measurements: Height, weight, and BMI. Blood pressure. Lipid and cholesterol levels. These may be checked every 5 years, or more frequently if you are over 66 years old. Skin check. Lung cancer screening. You may have this screening every year starting at age 22 if you have a 30-pack-year history of smoking and currently smoke or have quit within the past 15 years. Fecal occult blood test (FOBT) of the stool. You may have this test every year starting at age 25. Flexible sigmoidoscopy or colonoscopy. You may have a sigmoidoscopy every 5 years or a colonoscopy every 10 years starting at age 70. Hepatitis C blood test. Hepatitis B blood test. Sexually transmitted disease (STD) testing. Diabetes screening. This is done by checking your blood sugar (glucose) after you have not eaten for a while (fasting). You may have this done every 1-3 years. Bone density scan. This is done to screen for osteoporosis. You may have this done starting at age 60. Mammogram. This may be done every 1-2 years. Talk to your health care provider about how often you should have regular mammograms. Talk with your health care provider about your test results, treatment options, and if necessary, the need for more tests. Vaccines  Your health care provider may recommend certain vaccines, such as: Influenza vaccine. This is recommended every year. Tetanus, diphtheria, and acellular pertussis (Tdap, Td) vaccine. You may need a Td booster every 10 years. Zoster vaccine. You may need this after age 75. Pneumococcal 13-valent conjugate (PCV13)  vaccine. One dose is recommended after age  66. Pneumococcal polysaccharide (PPSV23) vaccine. One dose is recommended after age 89. Talk to your health care provider about which screenings and vaccines you need and how often you need them. This information is not intended to replace advice given to you by your health care provider. Make sure you discuss any questions you have with your health care provider. Document Released: 03/02/2015 Document Revised: 10/24/2015 Document Reviewed: 12/05/2014 Elsevier Interactive Patient Education  2017 [place] Prevention in the Home Falls can cause injuries. They can happen to people of all ages. There are many things you can do to make your home safe and to help prevent falls. What can I do on the outside of my home? Regularly fix the edges of walkways and driveways and fix any cracks. Remove anything that might make you trip as you walk through a door, such as a raised step or threshold. Trim any bushes or trees on the path to your home. Use bright outdoor lighting. Clear any walking paths of anything that might make someone trip, such as rocks or tools. Regularly check to see if handrails are loose or broken. Make sure that both sides of any steps have handrails. Any raised decks and porches should have guardrails on the edges. Have any leaves, snow, or ice cleared regularly. Use sand or salt on walking paths during winter. Clean up any spills in your garage right away. This includes oil or grease spills. What can I do in the bathroom? Use night lights. Install grab bars by the toilet and in the tub and shower. Do not use towel bars as grab bars. Use non-skid mats or decals in the tub or shower. If you need to sit down in the shower, use a plastic, non-slip stool. Keep the floor dry. Clean up any water that spills on the floor as soon as it happens. Remove soap buildup in the tub or shower regularly. Attach bath mats securely with double-sided non-slip rug tape. Do not have throw  rugs and other things on the floor that can make you trip. What can I do in the bedroom? Use night lights. Make sure that you have a light by your bed that is easy to reach. Do not use any sheets or blankets that are too big for your bed. They should not hang down onto the floor. Have a firm chair that has side arms. You can use this for support while you get dressed. Do not have throw rugs and other things on the floor that can make you trip. What can I do in the kitchen? Clean up any spills right away. Avoid walking on wet floors. Keep items that you use a lot in easy-to-reach places. If you need to reach something above you, use a strong step stool that has a grab bar. Keep electrical cords out of the way. Do not use floor polish or wax that makes floors slippery. If you must use wax, use non-skid floor wax. Do not have throw rugs and other things on the floor that can make you trip. What can I do with my stairs? Do not leave any items on the stairs. Make sure that there are handrails on both sides of the stairs and use them. Fix handrails that are broken or loose. Make sure that handrails are as long as the stairways. Check any carpeting to make sure that it is firmly attached to the stairs. Fix any carpet that is loose  or worn. Avoid having throw rugs at the top or bottom of the stairs. If you do have throw rugs, attach them to the floor with carpet tape. Make sure that you have a light switch at the top of the stairs and the bottom of the stairs. If you do not have them, ask someone to add them for you. What else can I do to help prevent falls? Wear shoes that: Do not have high heels. Have rubber bottoms. Are comfortable and fit you well. Are closed at the toe. Do not wear sandals. If you use a stepladder: Make sure that it is fully opened. Do not climb a closed stepladder. Make sure that both sides of the stepladder are locked into place. Ask someone to hold it for you, if  possible. Clearly mark and make sure that you can see: Any grab bars or handrails. First and last steps. Where the edge of each step is. Use tools that help you move around (mobility aids) if they are needed. These include: Canes. Walkers. Scooters. Crutches. Turn on the lights when you go into a dark area. Replace any light bulbs as soon as they burn out. Set up your furniture so you have a clear path. Avoid moving your furniture around. If any of your floors are uneven, fix them. If there are any pets around you, be aware of where they are. Review your medicines with your doctor. Some medicines can make you feel dizzy. This can increase your chance of falling. Ask your doctor what other things that you can do to help prevent falls. This information is not intended to replace advice given to you by your health care provider. Make sure you discuss any questions you have with your health care provider. Document Released: 11/30/2008 Document Revised: 07/12/2015 Document Reviewed: 03/10/2014 Elsevier Interactive Patient Education  2017 [employer].

## 2020-10-09 NOTE — Progress Notes (Signed)
Subjective:   Angela Gibbs is a 80 y.o. female who presents for Medicare Annual (Subsequent) preventive examination.  I connected with Tynisa today by telephone and verified that I am speaking with the correct person using two identifiers. Location patient: home Location provider: work Persons participating in the virtual visit: patient, Marine scientist.    I discussed the limitations, risks, security and privacy concerns of performing an evaluation and management service by telephone and the availability of in person appointments. I also discussed with the patient that there may be a patient responsible charge related to this service. The patient expressed understanding and verbally consented to this telephonic visit.    Interactive audio and video telecommunications were attempted between this provider and patient, however failed, due to patient having technical difficulties OR patient did not have access to video capability.  We continued and completed visit with audio only.  Some vital signs may be absent or patient reported.   Time Spent with patient on telephone encounter: 40 minutes   Review of Systems     Cardiac Risk Factors include: advanced age (>72mn, >>42women);hypertension;obesity (BMI >30kg/m2)     Objective:    Today's Vitals   10/09/20 1504  Weight: 173 lb (78.5 kg)  Height: '5\' 2"'$  (1.575 m)   Body mass index is 31.64 kg/m.  Advanced Directives 03/13/2020 01/10/2020 01/03/2020 05/18/2019 12/30/2018 03/06/2017 08/03/2016  Does Patient Have a Medical Advance Directive? Yes Yes Yes No Yes Yes No  Type of AParamed[place]of [place]St. [place]Living will [place]Living will HGargathaLiving will - HPikevilleLiving will HHorse ShoeLiving will -  Does patient want to make changes to medical advance directive? No - Patient declined No - Patient declined - - No - Patient declined No - Patient  declined -  Copy of [place] Chart? No - copy requested No - copy requested - - No - copy requested No - copy requested -    Current Medications (verified) Outpatient Encounter Medications as of 10/09/2020  Medication Sig   ALPRAZolam (XANAX) 0.25 MG tablet Take 1 tablet (0.25 mg total) by mouth 2 (two) times daily as needed for anxiety.   Ascorbic Acid (VITAMIN C) 100 MG tablet Take 1,000 mg by mouth daily. Reported on 05/24/2015   butalbital-acetaminophen-caffeine (FIORICET, ESGIC) 50-325-40 MG per tablet Take by mouth 2 (two) times daily as needed for headache.   Cholecalciferol 125 MCG (5000 UT) TABS daily at 0600.   co-enzyme Q-10 30 MG capsule Take 30 mg by mouth daily.   cyclobenzaprine (FLEXERIL) 5 MG tablet Take 5 mg by mouth 2 (two) times daily.   nebivolol (BYSTOLIC) 10 MG tablet TAKE 1 TABLET(10 MG) BY MOUTH DAILY   valsartan (DIOVAN) 160 MG tablet Take 1.5 tablets (240 mg total) by mouth daily.   albuterol (VENTOLIN HFA) 108 (90 Base) MCG/ACT inhaler Inhale 1-2 puffs into the lungs every 6 (six) hours as needed for wheezing or shortness of breath. (Patient not taking: Reported on 10/09/2020)   atenolol (TENORMIN) 25 MG tablet Take 1 tablet (25 mg total) by mouth daily. (Patient not taking: Reported on 10/09/2020)   clindamycin (CLEOCIN) 150 MG capsule SMARTSIG:4 Capsule(s) By Mouth (Patient not taking: Reported on 10/09/2020)   clopidogrel (PLAVIX) 75 MG tablet Take 1 tablet (75 mg total) by mouth daily. (Patient not taking: Reported on 10/09/2020)   diclofenac Sodium (VOLTAREN) 1 % GEL APPLY 2 GRAMS EXTERNALLY TO THE AFFECTED  AREA FOUR TIMES DAILY   doxycycline (VIBRAMYCIN) 100 MG capsule Take 1 capsule (100 mg total) by mouth 2 (two) times daily. (Patient not taking: Reported on 10/09/2020)   erythromycin ophthalmic ointment 1 application.  (Patient not taking: Reported on 10/09/2020)   FLUoxetine (PROZAC) 20 MG tablet TAKE 1/2 TABLET BY MOUTH EVERY DAY FOR 10 DAYS,  THE 1 TABLET EVERY DAY FOR 10 DAYS, THEN 1& 1/2 TABLETS EVERY DAY (Patient not taking: Reported on 10/09/2020)   fluticasone (FLONASE) 50 MCG/ACT nasal spray Place 2 sprays into both nostrils daily. (Patient not taking: Reported on 10/09/2020)   furosemide (LASIX) 20 MG tablet TAKE 1 TABLET BY MOUTH EVERY DAY ON OCCASION AS NEEDED FOR SWELLING (Patient not taking: Reported on 10/09/2020)   guaiFENesin-dextromethorphan (ROBITUSSIN DM) 100-10 MG/5ML syrup Take 5 mLs by mouth 3 (three) times daily as needed for cough. (Patient not taking: Reported on 10/09/2020)   HYDROcodone-acetaminophen (NORCO/VICODIN) 5-325 MG tablet Take 1 tablet by mouth every 6 (six) hours as needed. (Patient not taking: Reported on 10/09/2020)   lidocaine (LIDODERM) 5 % Place 1 patch onto the skin.  (Patient not taking: Reported on 10/09/2020)   LORazepam (ATIVAN) 1 MG tablet SMARTSIG:2 Tablet(s) By Mouth (Patient not taking: Reported on 10/09/2020)   NYSTATIN powder Apply topically 2 (two) times daily. (Patient not taking: Reported on 10/09/2020)   omeprazole (PRILOSEC OTC) 20 MG tablet Take 30 mg by mouth daily.  (Patient not taking: Reported on 10/09/2020)   ondansetron (ZOFRAN-ODT) 4 MG disintegrating tablet Take 2 mg by mouth every 6 (six) hours as needed. (Patient not taking: Reported on 10/09/2020)   Prednicarbate 0.1 % CREA  (Patient not taking: Reported on 10/09/2020)   promethazine (PHENERGAN) 25 MG tablet Take 1 tablet (25 mg total) by mouth every 6 (six) hours as needed for nausea or vomiting. (Patient not taking: Reported on 10/09/2020)   sodium chloride (V-R NASAL SPRAY SALINE) 0.65 % nasal spray Place 1 spray into the nose as needed. Use in both nostrils twice daily (Patient not taking: Reported on 10/09/2020)   triamcinolone cream (KENALOG) 0.1 % Apply topically 2 (two) times daily. (Patient not taking: Reported on 10/09/2020)   No facility-administered encounter medications on file as of 10/09/2020.    Allergies  (verified) Cephalosporins, Ciprofloxacin, Influenza virus vaccine, Keflex [cephalexin], Latex, Levaquin [levofloxacin], Nitrofuran derivatives, Penicillins, Sulfacetamide sodium, Xopenex [levalbuterol hcl], Zostavax [zoster vaccine live], Erythromycin, Hydrochlorothiazide, Lisinopril, Petrolatum, Pregabalin, Ropinirole hcl, Shellfish allergy, Sulfa antibiotics, Aspirin, Avelox [moxifloxacin hcl in nacl], Cefdinir, Chromium, Eggs or egg-derived products, Iodinated diagnostic agents, Lactose intolerance (gi), Percodan [oxycodone-aspirin], Prednisone, and Troleandomycin   History: Past Medical History:  Diagnosis Date   Allergy    Concussion    Falls    Head injury    Herpes    Hypertension    Migraines    Nerve damage    Shingles    Past Surgical History:  Procedure Laterality Date   BRAIN SURGERY     CRANIOTOMY FOR EXTRACRANIAL-INTRACRANIAL BYPASS     EYE SURGERY  4.4.17   milia excision   HIP SURGERY     intracranial surgery   NERVE SURGERY     ROOT CANAL     TONSILLECTOMY     Family History  Problem Relation Age of Onset   Heart disease Father    Heart attack Father    Social History   Socioeconomic History   Marital status: Married    Spouse name: Not on file   Number of children:  Not on file   Years of education: Not on file   Highest education level: Not on file  Occupational History   Not on file  Tobacco Use   Smoking status: Never   Smokeless tobacco: Never  Vaping Use   Vaping Use: Never used  Substance and Sexual Activity   Alcohol use: Yes    Comment: social   Drug use: No   Sexual activity: Yes  Other Topics Concern   Not on file  Social History Narrative   Not on file   Social Determinants of Health   Financial Resource Strain: Not on file  Food Insecurity: Not on file  Transportation Needs: Not on file  Physical Activity: Sufficiently Active   Days of Exercise per Week: 3 days   Minutes of Exercise per Session: 60 min  Stress: Not on file   Social Connections: Not on file    Tobacco Counseling Counseling given: Not Answered   Clinical Intake:  Pre-visit preparation completed: Yes  Pain : No/denies pain     Nutritional Status: BMI > 30  Obese Nutritional Risks: None Diabetes: No  How often do you need to have someone help you when you read instructions, pamphlets, or other written materials from your doctor or pharmacy?: 1 - Never  Diabetic?No  Interpreter Needed?: No  Information entered by :: Caroleen Hamman LPN   Activities of Daily Living In your present state of health, do you have any difficulty performing the following activities: 10/09/2020  Hearing? N  Vision? N  Difficulty concentrating or making decisions? N  Walking or climbing stairs? N  Dressing or bathing? N  Doing errands, shopping? N  Preparing Food and eating ? N  Using the Toilet? N  In the past six months, have you accidently leaked urine? N  Do you have problems with loss of bowel control? N  Managing your Medications? N  Managing your Finances? N  Housekeeping or managing your Housekeeping? N  Some recent data might be hidden    Patient Care Team: Copland, Gay Filler, MD as PCP - General (Family Medicine) Luellen Pucker, MD as Consulting Physician (Obstetrics and Gynecology) Arta Bruce, OD as Consulting Physician (Ophthalmology) Sabra Heck Precious Haws, MD as Consulting Physician (Neurology) Sylvie Farrier, NP as Consulting Physician (Pain Medicine) Rolan Lipa, MD as Consulting Physician (Gastroenterology) Kerney Elbe, MD as Consulting Physician (Internal Medicine) Lucia Bitter., MD as Physician Assistant (Pain Medicine)  Indicate any recent Medical Services you may have received from other than [facility] providers in the past year (date may be approximate).     Assessment:   This is a routine wellness examination for Awa.  Hearing/Vision screen Vision Screening - Comments:: Last eye  exam-02/2020-  Dietary issues and exercise activities discussed: Current Exercise Habits: Structured exercise class, Time (Minutes): 60, Frequency (Times/Week): 3, Weekly Exercise (Minutes/Week): 180, Intensity: Mild, Exercise limited by: None identified   Goals Addressed             This Visit's Progress    Patient Stated       Maintain current health       Depression Screen PHQ 2/9 Scores 10/09/2020 08/31/2019 03/06/2017 05/24/2015 05/24/2015  PHQ - 2 Score 0 6 0 0 -  PHQ- 9 Score - 21 - - -  Exception Documentation - - - Patient refusal Patient refusal    Fall Risk Fall Risk  10/09/2020 01/12/2019 03/06/2017 05/24/2015 05/24/2015  Falls in the past year? 0 0 Yes No No  Comment - Emmi Telephone Survey: data to providers prior to load - - -  Number falls in past yr: 0 - 1 - -  Injury with Fall? 0 - No - -  Follow up Falls prevention discussed - Education provided;Falls prevention discussed - -    FALL RISK PREVENTION PERTAINING TO THE HOME:  Any stairs in or around the home? Yes  If so, are there any without handrails? No  Home free of loose throw rugs in walkways, pet beds, electrical cords, etc? Yes  Adequate lighting in your home to reduce risk of falls? Yes   ASSISTIVE DEVICES UTILIZED TO PREVENT FALLS:  Life alert? No  Use of a cane, walker or w/c? No  Grab bars in the bathroom? Yes  Shower chair or bench in shower? No  Elevated toilet seat or a handicapped toilet? No   TIMED UP AND GO:  Was the test performed? No . Phone visit   Cognitive Function:Normal cognitive status assessed by  this Nurse Health Advisor. No abnormalities found.          Immunizations Immunization History  Administered Date(s) Administered   PFIZER(Purple Top)SARS-COV-2 Vaccination 03/09/2019, 03/30/2019   Pneumococcal Conjugate-13 11/10/2013, 02/20/2016   Pneumococcal Polysaccharide-23 01/24/2019   Td 06/11/2016   Tdap 02/17/2006    TDAP status: Up to date  Flu vaccine status:  Allergic  Pneumococcal vaccine status: Up to date  Covid-19 vaccine status: Completed vaccines  Shingles Vaccine: Allergic   Screening Tests Health Maintenance  Topic Date Due   Zoster Vaccines- Shingrix (1 of 2) Never done   COVID-19 Vaccine (3 - Pfizer risk series) 04/27/2019   TETANUS/TDAP  06/12/2026   DEXA SCAN  Completed   PNA vac Low Risk Adult  Completed   HPV VACCINES  Aged Out    Health Maintenance  Health Maintenance Due  Topic Date Due   Zoster Vaccines- Shingrix (1 of 2) Never done   COVID-19 Vaccine (3 - Pfizer risk series) 04/27/2019    Colorectal cancer screening: No longer required.   Mammogram-status: Due- To be ordered by GYN  Bone Density status: Due- To be ordered by GYN  Lung Cancer Screening: (Low Dose CT Chest recommended if Age 40-80 years, 30 pack-year currently smoking OR have quit w/in 15years.) does not qualify.     Additional Screening:  Hepatitis C Screening: does not qualify  Vision Screening: Recommended annual ophthalmology exams for early detection of glaucoma and other disorders of the eye. Is the patient up to date with their annual eye exam?  Yes  Who is the provider or what is the name of the office in which the patient attends annual eye exams? unknown   Dental Screening: Recommended annual dental exams for proper oral hygiene  Community Resource Referral / Chronic Care Management: CRR required this visit?  No   CCM required this visit?  No      Plan:     I have personally reviewed and noted the following in the patient's chart:   Medical and social history Use of alcohol, tobacco or illicit drugs  Current medications and supplements including opioid prescriptions.  Functional ability and status Nutritional status Physical activity Advanced directives List of other physicians Hospitalizations, surgeries, and ER visits in previous 12 months Vitals Screenings to include cognitive, depression, and  falls Referrals and appointments  In addition, I have reviewed and discussed with patient certain preventive protocols, quality metrics, and best practice recommendations. A written personalized care plan for preventive services  as well as general preventive health recommendations were provided to patient.   Due to this being a telephonic visit, the after visit summary with patients personalized plan was offered to patient via mail or my-chart. Patient would like to access on my-chart.   Marta Antu, LPN   [id number]  Nurse Health Advisor  Nurse Notes: None

## 2020-10-23 ENCOUNTER — Other Ambulatory Visit: Payer: Self-pay | Admitting: Family Medicine

## 2020-10-23 DIAGNOSIS — I10 Essential (primary) hypertension: Secondary | ICD-10-CM

## 2020-11-06 ENCOUNTER — Other Ambulatory Visit: Payer: Self-pay

## 2020-11-06 ENCOUNTER — Other Ambulatory Visit: Payer: Self-pay | Admitting: *Deleted

## 2020-11-06 ENCOUNTER — Inpatient Hospital Stay: Payer: Medicare Other | Attending: Hematology & Oncology

## 2020-11-06 DIAGNOSIS — D509 Iron deficiency anemia, unspecified: Secondary | ICD-10-CM | POA: Insufficient documentation

## 2020-11-06 DIAGNOSIS — D500 Iron deficiency anemia secondary to blood loss (chronic): Secondary | ICD-10-CM

## 2020-11-06 LAB — CBC WITH DIFFERENTIAL (CANCER CENTER ONLY)
Abs Immature Granulocytes: 0.07 10*3/uL (ref 0.00–0.07)
Basophils Absolute: 0.1 10*3/uL (ref 0.0–0.1)
Basophils Relative: 1 %
Eosinophils Absolute: 0.3 10*3/uL (ref 0.0–0.5)
Eosinophils Relative: 4 %
HCT: 31.2 % — ABNORMAL LOW (ref 36.0–46.0)
Hemoglobin: 10.2 g/dL — ABNORMAL LOW (ref 12.0–15.0)
Immature Granulocytes: 1 %
Lymphocytes Relative: 19 %
Lymphs Abs: 1.1 10*3/uL (ref 0.7–4.0)
MCH: 26.6 pg (ref 26.0–34.0)
MCHC: 32.7 g/dL (ref 30.0–36.0)
MCV: 81.5 fL (ref 80.0–100.0)
Monocytes Absolute: 0.7 10*3/uL (ref 0.1–1.0)
Monocytes Relative: 11 %
Neutro Abs: 3.9 10*3/uL (ref 1.7–7.7)
Neutrophils Relative %: 64 %
Platelet Count: 289 10*3/uL (ref 150–400)
RBC: 3.83 MIL/uL — ABNORMAL LOW (ref 3.87–5.11)
RDW: 13.2 % (ref 11.5–15.5)
WBC Count: 6 10*3/uL (ref 4.0–10.5)
nRBC: 0 % (ref 0.0–0.2)

## 2020-11-06 LAB — COMPREHENSIVE METABOLIC PANEL
ALT: 14 U/L (ref 0–44)
AST: 21 U/L (ref 15–41)
Albumin: 3.9 g/dL (ref 3.5–5.0)
Alkaline Phosphatase: 97 U/L (ref 38–126)
Anion gap: 7 (ref 5–15)
BUN: 17 mg/dL (ref 8–23)
CO2: 26 mmol/L (ref 22–32)
Calcium: 9.1 mg/dL (ref 8.9–10.3)
Chloride: 97 mmol/L — ABNORMAL LOW (ref 98–111)
Creatinine, Ser: 0.71 mg/dL (ref 0.44–1.00)
GFR, Estimated: >60 mL/min (ref >60)
Glucose, Bld: 109 mg/dL — ABNORMAL HIGH (ref 70–99)
Potassium: 4.5 mmol/L (ref 3.5–5.1)
Sodium: 130 mmol/L — ABNORMAL LOW (ref 135–145)
Total Bilirubin: 0.3 mg/dL (ref 0.3–1.2)
Total Protein: 7 g/dL (ref 6.5–8.1)

## 2020-11-07 ENCOUNTER — Telehealth: Payer: Self-pay

## 2020-11-07 ENCOUNTER — Telehealth: Payer: Self-pay | Admitting: *Deleted

## 2020-11-07 LAB — IRON AND TIBC
Iron: 21 ug/dL — ABNORMAL LOW (ref 41–142)
Saturation Ratios: 5 % — ABNORMAL LOW (ref 21–57)
TIBC: 441 ug/dL (ref 236–444)
UIBC: 420 ug/dL — ABNORMAL HIGH (ref 120–384)

## 2020-11-07 LAB — FERRITIN: Ferritin: 14 ng/mL (ref 11–307)

## 2020-11-07 NOTE — Telephone Encounter (Signed)
Called and informed patient's husband of lab results (ok per DPR), husband verbalized understanding and denies any questions or concerns at this time. Message sent to scheduling to call for IV Iron apt.

## 2020-11-07 NOTE — Telephone Encounter (Signed)
Per staff message Alyson Ingles 11/07/20 -called and gave upcoming appointments - confirmed (1) dose of IV Iron per Dr. Marin Olp

## 2020-11-07 NOTE — Telephone Encounter (Signed)
-----   Message from Volanda Napoleon, MD sent at 11/07/2020  1:52 PM EDT ----- Please call her and tell her that the iron level is very low.  She needs IV iron.  Please set this up.  Thanks.  Laurey Arrow

## 2020-11-08 ENCOUNTER — Other Ambulatory Visit: Payer: Self-pay

## 2020-11-08 ENCOUNTER — Inpatient Hospital Stay: Payer: Medicare Other

## 2020-11-08 VITALS — BP 157/54 | HR 54 | Temp 96.0°F | Resp 17

## 2020-11-08 DIAGNOSIS — D509 Iron deficiency anemia, unspecified: Secondary | ICD-10-CM | POA: Diagnosis not present

## 2020-11-08 DIAGNOSIS — D500 Iron deficiency anemia secondary to blood loss (chronic): Secondary | ICD-10-CM

## 2020-11-08 MED ORDER — SODIUM CHLORIDE 0.9 % IV SOLN
510.0000 mg | Freq: Once | INTRAVENOUS | Status: AC
  Administered 2020-11-08: 510 mg via INTRAVENOUS
  Filled 2020-11-08: qty 17

## 2020-11-08 MED ORDER — SODIUM CHLORIDE 0.9 % IV SOLN: Freq: Once | INTRAVENOUS | Status: AC

## 2020-11-08 NOTE — Patient Instructions (Signed)
Ferumoxytol Injection What is this medication? FERUMOXYTOL (FER ue MOX i tol) treats low levels of iron in your body (iron deficiency anemia). Iron is a mineral that plays an important role in making red blood cells, which carry oxygen from your lungs to the rest of your body. This medicine may be used for other purposes; ask your health care provider or pharmacist if you have questions. COMMON BRAND NAME(S): Feraheme What should I tell my care team before I take this medication? They need to know if you have any of these conditions: Anemia not caused by low iron levels High levels of iron in the blood Magnetic resonance imaging (MRI) test scheduled An unusual or allergic reaction to iron, other medications, foods, dyes, or preservatives Pregnant or trying to get pregnant Breast-feeding How should I use this medication? This medication is for injection into a vein. It is given in a hospital or clinic setting. Talk to your care team the use of this medication in children. Special care may be needed. Overdosage: If you think you have taken too much of this medicine contact a poison control center or emergency room at once. NOTE: This medicine is only for you. Do not share this medicine with others. What if I miss a dose? It is important not to miss your dose. Call your care team if you are unable to keep an appointment. What may interact with this medication? Other iron products This list may not describe all possible interactions. Give your health care provider a list of all the medicines, herbs, non-prescription drugs, or dietary supplements you use. Also tell them if you smoke, drink alcohol, or use illegal drugs. Some items may interact with your medicine. What should I watch for while using this medication? Visit your care team regularly. Tell your care team if your symptoms do not start to get better or if they get worse. You may need blood work done while you are taking this  medication. You may need to follow a special diet. Talk to your care team. Foods that contain iron include: whole grains/cereals, dried fruits, beans, or peas, leafy green vegetables, and organ meats (liver, kidney). What side effects may I notice from receiving this medication? Side effects that you should report to your care team as soon as possible: Allergic reactions-skin rash, itching, hives, swelling of the face, lips, tongue, or throat Low blood pressure-dizziness, feeling faint or lightheaded, blurry vision Shortness of breath Side effects that usually do not require medical attention (report to your care team if they continue or are bothersome): Flushing Headache Joint pain Muscle pain Nausea Pain, redness, or irritation at injection site This list may not describe all possible side effects. Call your doctor for medical advice about side effects. You may report side effects to FDA at 1-800-FDA-1088. Where should I keep my medication? This medication is given in a hospital or clinic and will not be stored at home. NOTE: This sheet is a summary. It may not cover all possible information. If you have questions about this medicine, talk to your doctor, pharmacist, or health care provider.  2022 Elsevier/Gold Standard (2020-06-22 15:35:12)  

## 2020-11-13 DIAGNOSIS — M545 Low back pain, unspecified: Secondary | ICD-10-CM | POA: Diagnosis not present

## 2020-11-13 DIAGNOSIS — M25551 Pain in right hip: Secondary | ICD-10-CM | POA: Diagnosis not present

## 2020-11-13 DIAGNOSIS — Z79899 Other long term (current) drug therapy: Secondary | ICD-10-CM | POA: Diagnosis not present

## 2020-11-13 DIAGNOSIS — M62838 Other muscle spasm: Secondary | ICD-10-CM | POA: Diagnosis not present

## 2020-11-13 DIAGNOSIS — G8929 Other chronic pain: Secondary | ICD-10-CM | POA: Diagnosis not present

## 2020-11-13 DIAGNOSIS — G894 Chronic pain syndrome: Secondary | ICD-10-CM | POA: Diagnosis not present

## 2020-11-13 DIAGNOSIS — B0229 Other postherpetic nervous system involvement: Secondary | ICD-10-CM | POA: Diagnosis not present

## 2020-11-13 DIAGNOSIS — G4489 Other headache syndrome: Secondary | ICD-10-CM | POA: Diagnosis not present

## 2020-12-11 ENCOUNTER — Encounter: Payer: Self-pay | Admitting: Family

## 2020-12-11 ENCOUNTER — Inpatient Hospital Stay (HOSPITAL_BASED_OUTPATIENT_CLINIC_OR_DEPARTMENT_OTHER): Payer: Medicare Other | Admitting: Family

## 2020-12-11 ENCOUNTER — Inpatient Hospital Stay: Payer: Medicare Other | Attending: Hematology & Oncology

## 2020-12-11 ENCOUNTER — Other Ambulatory Visit: Payer: Self-pay

## 2020-12-11 VITALS — BP 151/70 | HR 57 | Temp 98.7°F | Resp 18 | Wt 176.0 lb

## 2020-12-11 DIAGNOSIS — Z886 Allergy status to analgesic agent status: Secondary | ICD-10-CM | POA: Insufficient documentation

## 2020-12-11 DIAGNOSIS — M25473 Effusion, unspecified ankle: Secondary | ICD-10-CM | POA: Insufficient documentation

## 2020-12-11 DIAGNOSIS — L4050 Arthropathic psoriasis, unspecified: Secondary | ICD-10-CM | POA: Insufficient documentation

## 2020-12-11 DIAGNOSIS — D500 Iron deficiency anemia secondary to blood loss (chronic): Secondary | ICD-10-CM | POA: Diagnosis not present

## 2020-12-11 DIAGNOSIS — Z888 Allergy status to other drugs, medicaments and biological substances status: Secondary | ICD-10-CM | POA: Insufficient documentation

## 2020-12-11 DIAGNOSIS — Z79899 Other long term (current) drug therapy: Secondary | ICD-10-CM | POA: Diagnosis not present

## 2020-12-11 DIAGNOSIS — Z882 Allergy status to sulfonamides status: Secondary | ICD-10-CM | POA: Insufficient documentation

## 2020-12-11 DIAGNOSIS — D509 Iron deficiency anemia, unspecified: Secondary | ICD-10-CM | POA: Diagnosis not present

## 2020-12-11 DIAGNOSIS — Z880 Allergy status to penicillin: Secondary | ICD-10-CM | POA: Diagnosis not present

## 2020-12-11 DIAGNOSIS — Z887 Allergy status to serum and vaccine status: Secondary | ICD-10-CM | POA: Insufficient documentation

## 2020-12-11 DIAGNOSIS — R5383 Other fatigue: Secondary | ICD-10-CM | POA: Insufficient documentation

## 2020-12-11 DIAGNOSIS — Z881 Allergy status to other antibiotic agents status: Secondary | ICD-10-CM | POA: Diagnosis not present

## 2020-12-11 DIAGNOSIS — Z885 Allergy status to narcotic agent status: Secondary | ICD-10-CM | POA: Diagnosis not present

## 2020-12-11 LAB — CBC WITH DIFFERENTIAL (CANCER CENTER ONLY)
Abs Immature Granulocytes: 0.01 10*3/uL (ref 0.00–0.07)
Basophils Absolute: 0.1 10*3/uL (ref 0.0–0.1)
Basophils Relative: 1 %
Eosinophils Absolute: 0.4 10*3/uL (ref 0.0–0.5)
Eosinophils Relative: 6 %
HCT: 39.5 % (ref 36.0–46.0)
Hemoglobin: 13 g/dL (ref 12.0–15.0)
Immature Granulocytes: 0 %
Lymphocytes Relative: 17 %
Lymphs Abs: 1 10*3/uL (ref 0.7–4.0)
MCH: 27.8 pg (ref 26.0–34.0)
MCHC: 32.9 g/dL (ref 30.0–36.0)
MCV: 84.4 fL (ref 80.0–100.0)
Monocytes Absolute: 0.8 10*3/uL (ref 0.1–1.0)
Monocytes Relative: 14 %
Neutro Abs: 3.4 10*3/uL (ref 1.7–7.7)
Neutrophils Relative %: 62 %
Platelet Count: 224 10*3/uL (ref 150–400)
RBC: 4.68 MIL/uL (ref 3.87–5.11)
RDW: 18.2 % — ABNORMAL HIGH (ref 11.5–15.5)
WBC Count: 5.6 10*3/uL (ref 4.0–10.5)
nRBC: 0 % (ref 0.0–0.2)

## 2020-12-11 LAB — RETICULOCYTES
Immature Retic Fract: 1.9 % — ABNORMAL LOW (ref 2.3–15.9)
RBC.: 4.65 MIL/uL (ref 3.87–5.11)
Retic Count, Absolute: 60.5 10*3/uL (ref 19.0–186.0)
Retic Ct Pct: 1.3 % (ref 0.4–3.1)

## 2020-12-11 NOTE — Progress Notes (Signed)
Hematology and Oncology Follow Up Visit  Angela Gibbs [id number] 10-18-1940 80 y.o. 12/11/2020   Principle Diagnosis:  Iron deficiency anemia   Current Therapy:        IV iron as indicated    Interim History:  Angela Gibbs is here today for follow-up. She is feeling so much better since receiving IV iron in September. Her Hgb is now 13.0 from 10.2. MCV is 84.  She notes that her fatigue has resolved and SOB with exertion is much improved She is hoping to plan a trip to [place] with her sister and their husbands.  She has not noted any blood loss. No bruising or petechiae.  No fever, chills, n/v, cough, rash, dizziness, chest pain, palpitations, abdominal pain or changes in bowel or bladder habits.  She has some mild swelling in her ankle that is stable.  She has joint swelling due to psoriatic arthritis.  No numbness or tingling in her extremities at this time.  No falls or syncope.  She has maintained a good appetite and is staying well hydrated. Her weight is stable at   176 lbs.   ECOG Performance Status: 0 - Asymptomatic  Medications:  Allergies as of 12/11/2020       Reactions   Cephalosporins Shortness Of Breath   Breathing difficulty   Ciprofloxacin Hives, Shortness Of Breath   Influenza Virus Vaccine Other (See Comments)   Numbness in legs   Keflex [cephalexin] Hives   Latex Hives, Itching, Rash   Levaquin [levofloxacin] Hives, Palpitations, Other (See Comments)   Chest pain   Nitrofuran Derivatives Hives, Itching, Swelling   Penicillins Hives   Sulfacetamide Sodium Hives   Xopenex [levalbuterol Hcl] Anxiety, Other (See Comments)   Tremors with violent body chattering and shaking   Zostavax [zoster Vaccine Live] Other (See Comments)   Pt has been told by specialists not to receive vaccine d/t h/o herpes in bloodstream and shingles.   Erythromycin Other (See Comments)   "stomach issues"   Hydrochlorothiazide Other (See Comments)   Migraine   Lisinopril Cough    Petrolatum Nausea Only   Pregabalin Other (See Comments)   Ropinirole Hcl Nausea And Vomiting   Shellfish Allergy Hives   Sulfa Antibiotics Hives   Aspirin Other (See Comments)   Stomach pain/burning   Avelox [moxifloxacin Hcl In Nacl] Nausea Only   Cefdinir Nausea And Vomiting   Chromium Other (See Comments)   Blisters   Eggs Or Egg-derived Products    Per allergy testing, no known reaction. Pt states she was told she could probably eat 2-3 eggs weekly w/o issue.   Iodinated Diagnostic Agents Other (See Comments)   unknown   Lactose Intolerance (gi) Other (See Comments)   "stomach issues"   Percodan [oxycodone-aspirin] Nausea And Vomiting   Prednisone Palpitations   No injections, can do the pills   Troleandomycin Other (See Comments)   Unknown reaction        Medication List        Accurate as of December 11, 2020  2:28 PM. If you have any questions, ask your nurse or doctor.          albuterol 108 (90 Base) MCG/ACT inhaler Commonly known as: VENTOLIN HFA Inhale 1-2 puffs into the lungs every 6 (six) hours as needed for wheezing or shortness of breath.   ALPRAZolam 0.25 MG tablet Commonly known as: XANAX Take 1 tablet (0.25 mg total) by mouth 2 (two) times daily as needed for anxiety.   atenolol  25 MG tablet Commonly known as: TENORMIN Take 1 tablet (25 mg total) by mouth daily.   butalbital-acetaminophen-caffeine 50-325-40 MG tablet Commonly known as: FIORICET Take by mouth 2 (two) times daily as needed for headache.   Cholecalciferol 125 MCG (5000 UT) Tabs daily at 0600.   clindamycin 150 MG capsule Commonly known as: CLEOCIN 450 mg 3 (three) times daily. 1 hour prior to dental procedures and 6 hours post dental procedure   clopidogrel 75 MG tablet Commonly known as: PLAVIX Take 1 tablet (75 mg total) by mouth daily.   co-enzyme Q-10 30 MG capsule Take 30 mg by mouth daily.   cyclobenzaprine 5 MG tablet Commonly known as: FLEXERIL Take 5 mg by  mouth 2 (two) times daily.   diclofenac Sodium 1 % Gel Commonly known as: VOLTAREN APPLY 2 GRAMS EXTERNALLY TO THE AFFECTED AREA FOUR TIMES DAILY   doxycycline 100 MG capsule Commonly known as: VIBRAMYCIN Take 1 capsule (100 mg total) by mouth 2 (two) times daily.   erythromycin ophthalmic ointment 1 application. As needed   FLUoxetine 20 MG tablet Commonly known as: PROZAC TAKE 1/2 TABLET BY MOUTH EVERY DAY FOR 10 DAYS, THE 1 TABLET EVERY DAY FOR 10 DAYS, THEN 1& 1/2 TABLETS EVERY DAY What changed:  how much to take how to take this when to take this additional instructions   fluticasone 50 MCG/ACT nasal spray Commonly known as: FLONASE Place 2 sprays into both nostrils daily.   furosemide 20 MG tablet Commonly known as: LASIX TAKE 1 TABLET BY MOUTH EVERY DAY ON OCCASION AS NEEDED FOR SWELLING   guaiFENesin-dextromethorphan 100-10 MG/5ML syrup Commonly known as: ROBITUSSIN DM Take 5 mLs by mouth 3 (three) times daily as needed for cough.   HYDROcodone-acetaminophen 5-325 MG tablet Commonly known as: NORCO/VICODIN Take 1 tablet by mouth every 6 (six) hours as needed.   lidocaine 5 % Commonly known as: LIDODERM Place 1 patch onto the skin.   LORazepam 1 MG tablet Commonly known as: ATIVAN SMARTSIG:2 Tablet(s) By Mouth   nebivolol 10 MG tablet Commonly known as: BYSTOLIC TAKE 1 HENIDP(82 MG) BY MOUTH DAILY   nystatin powder Generic drug: nystatin Apply topically 2 (two) times daily. As needed   omeprazole 20 MG tablet Commonly known as: PRILOSEC OTC Take 30 mg by mouth daily.   ondansetron 4 MG disintegrating tablet Commonly known as: ZOFRAN-ODT Take 2 mg by mouth every 6 (six) hours as needed.   Prednicarbate 0.1 % Crea as needed.   promethazine 25 MG tablet Commonly known as: PHENERGAN Take 1 tablet (25 mg total) by mouth every 6 (six) hours as needed for nausea or vomiting.   sodium chloride 0.65 % nasal spray Commonly known as: V-R NASAL SPRAY  SALINE Place 1 spray into the nose as needed. Use in both nostrils twice daily   triamcinolone cream 0.1 % Commonly known as: KENALOG Apply topically 2 (two) times daily. As needed   valsartan 160 MG tablet Commonly known as: DIOVAN TAKE 1 AND 1/2 TABLETS(240 MG) BY MOUTH DAILY   vitamin C 100 MG tablet Take 1,000 mg by mouth daily. Reported on 05/24/2015        Allergies:  Allergies  Allergen Reactions   Cephalosporins Shortness Of Breath    Breathing difficulty   Ciprofloxacin Hives and Shortness Of Breath   Influenza Virus Vaccine Other (See Comments)    Numbness in legs   Keflex [Cephalexin] Hives   Latex Hives, Itching and Rash   Levaquin [Levofloxacin] Hives,  Palpitations and Other (See Comments)    Chest pain   Nitrofuran Derivatives Hives, Itching and Swelling   Penicillins Hives   Sulfacetamide Sodium Hives   Xopenex [Levalbuterol Hcl] Anxiety and Other (See Comments)    Tremors with violent body chattering and shaking   Zostavax [Zoster Vaccine Live] Other (See Comments)    Pt has been told by specialists not to receive vaccine d/t h/o herpes in bloodstream and shingles.   Erythromycin Other (See Comments)    "stomach issues"   Hydrochlorothiazide Other (See Comments)    Migraine   Lisinopril Cough   Petrolatum Nausea Only   Pregabalin Other (See Comments)   Ropinirole Hcl Nausea And Vomiting   Shellfish Allergy Hives   Sulfa Antibiotics Hives   Aspirin Other (See Comments)    Stomach pain/burning   Avelox [Moxifloxacin Hcl In Nacl] Nausea Only   Cefdinir Nausea And Vomiting   Chromium Other (See Comments)    Blisters   Eggs Or Egg-Derived Products     Per allergy testing, no known reaction. Pt states she was told she could probably eat 2-3 eggs weekly w/o issue.   Iodinated Diagnostic Agents Other (See Comments)    unknown    Lactose Intolerance (Gi) Other (See Comments)    "stomach issues"   Percodan [Oxycodone-Aspirin] Nausea And Vomiting    Prednisone Palpitations    No injections, can do the pills   Troleandomycin Other (See Comments)    Unknown reaction    Past Medical History, Surgical history, Social history, and Family History were reviewed and updated.  Review of Systems: All other 10 point review of systems is negative.   Physical Exam:  weight is 176 lb (79.8 kg). Her oral temperature is 98.7 F (37.1 C). Her blood pressure is 151/70 (abnormal) and her pulse is 57 (abnormal). Her respiration is 18 and oxygen saturation is 96%.   Wt Readings from Last 3 Encounters:  12/11/20 176 lb (79.8 kg)  10/09/20 173 lb (78.5 kg)  06/12/20 177 lb (80.3 kg)    Ocular: Sclerae unicteric, pupils equal, round and reactive to light Ear-nose-throat: Oropharynx clear, dentition fair Lymphatic: No cervical or supraclavicular adenopathy Lungs no rales or rhonchi, good excursion bilaterally Heart regular rate and rhythm, no murmur appreciated Abd soft, nontender, positive bowel sounds MSK no focal spinal tenderness, no joint edema Neuro: non-focal, well-oriented, appropriate affect Breasts: Deferred   Lab Results  Component Value Date   WBC 5.6 12/11/2020   HGB 13.0 12/11/2020   HCT 39.5 12/11/2020   MCV 84.4 12/11/2020   PLT 224 12/11/2020   Lab Results  Component Value Date   FERRITIN 14 11/06/2020   IRON 21 (L) 11/06/2020   TIBC 441 11/06/2020   UIBC 420 (H) 11/06/2020   IRONPCTSAT 5 (L) 11/06/2020   Lab Results  Component Value Date   RETICCTPCT 1.3 12/11/2020   RBC 4.68 12/11/2020   RBC 4.65 12/11/2020   No results found for: KPAFRELGTCHN, LAMBDASER, KAPLAMBRATIO No results found for: IGGSERUM, IGA, IGMSERUM No results found for: Will Bonnet, GAMS, MSPIKE, SPEI   Chemistry      Component Value Date/Time   NA 130 (L) 11/06/2020 1427   NA 134 (A) 11/18/2019 0000   K 4.5 11/06/2020 1427   CL 97 (L) 11/06/2020 1427   CO2 26 11/06/2020 1427   BUN 17 11/06/2020  1427   BUN 17 11/18/2019 0000   CREATININE 0.71 11/06/2020 1427   CREATININE 0.72 01/10/2020 1307  CREATININE 0.76 12/07/2019 1413   GLU 80 11/18/2019 0000      Component Value Date/Time   CALCIUM 9.1 11/06/2020 1427   ALKPHOS 97 11/06/2020 1427   AST 21 11/06/2020 1427   AST 18 01/10/2020 1307   ALT 14 11/06/2020 1427   ALT 18 01/10/2020 1307   BILITOT 0.3 11/06/2020 1427   BILITOT 0.2 (L) 01/10/2020 1307       Impression and Plan: Ms. Naron is a very pleasant 80 yo caucasian female with history of iron deficiency anemia.  Iron studies are pending. We will replace if needed.  Follow-up in 6 months.  She can contact our office with any questions or concerns.   Lottie Dawson, NP 10/25/20222:28 PM

## 2020-12-12 ENCOUNTER — Telehealth: Payer: Self-pay | Admitting: *Deleted

## 2020-12-12 LAB — IRON AND TIBC
Iron: 68 ug/dL (ref 41–142)
Saturation Ratios: 19 % — ABNORMAL LOW (ref 21–57)
TIBC: 359 ug/dL (ref 236–444)
UIBC: 291 ug/dL (ref 120–384)

## 2020-12-12 LAB — FERRITIN: Ferritin: 144 ng/mL (ref 11–307)

## 2020-12-12 NOTE — Telephone Encounter (Signed)
Per 12/12/20 los - called and gave upcoming appointments - confirmed

## 2020-12-12 NOTE — Telephone Encounter (Signed)
Per scheduling message Judson Roch - Called and lvm for  call back to schedule appointment. (1) dose of IV Iron

## 2020-12-13 ENCOUNTER — Other Ambulatory Visit: Payer: Self-pay

## 2020-12-13 DIAGNOSIS — I10 Essential (primary) hypertension: Secondary | ICD-10-CM

## 2020-12-20 ENCOUNTER — Telehealth: Payer: Self-pay | Admitting: *Deleted

## 2020-12-20 NOTE — Chronic Care Management (AMB) (Signed)
  Chronic Care Management   Note  12/20/2020 Name: SHERI GATCHEL MRN: [id number] DOB: 03/24/1940  Oswaldo Conroy Kief is a 80 y.o. year old female who is a primary care patient of Copland, Gay Filler, MD. I reached out to [employer] by phone today in response to a referral sent by Ms. Azariah M Staley's PCP.  Ms. Unterreiner was given information about Chronic Care Management services today including:  CCM service includes personalized support from designated clinical staff supervised by her physician, including individualized plan of care and coordination with other care providers 24/7 contact phone numbers for assistance for urgent and routine care needs. Service will only be billed when office clinical staff spend 20 minutes or more in a month to coordinate care. Only one practitioner may furnish and bill the service in a calendar month. The patient may stop CCM services at any time (effective at the end of the month) by phone call to the office staff. The patient is responsible for co-pay (up to 20% after annual deductible is met) if co-pay is required by the individual health plan.   Patient agreed to services and verbal consent obtained.   Follow up plan: Telephone appointment with care management team member scheduled for: 12/27/2020  Julian Hy, [facility]  Direct Dial: [phone]

## 2020-12-21 ENCOUNTER — Other Ambulatory Visit: Payer: Self-pay

## 2020-12-21 ENCOUNTER — Inpatient Hospital Stay: Payer: Medicare Other | Attending: Hematology & Oncology

## 2020-12-21 VITALS — BP 140/82 | HR 57 | Temp 97.6°F | Resp 18

## 2020-12-21 DIAGNOSIS — Z79899 Other long term (current) drug therapy: Secondary | ICD-10-CM | POA: Insufficient documentation

## 2020-12-21 DIAGNOSIS — D509 Iron deficiency anemia, unspecified: Secondary | ICD-10-CM | POA: Diagnosis not present

## 2020-12-21 DIAGNOSIS — D500 Iron deficiency anemia secondary to blood loss (chronic): Secondary | ICD-10-CM

## 2020-12-21 MED ORDER — SODIUM CHLORIDE 0.9 % IV SOLN
510.0000 mg | Freq: Once | INTRAVENOUS | Status: AC
  Administered 2020-12-21: 510 mg via INTRAVENOUS
  Filled 2020-12-21: qty 17

## 2020-12-21 MED ORDER — SODIUM CHLORIDE 0.9 % IV SOLN: Freq: Once | INTRAVENOUS | Status: AC

## 2020-12-21 NOTE — Patient Instructions (Signed)

## 2020-12-22 ENCOUNTER — Other Ambulatory Visit: Payer: Self-pay | Admitting: Family Medicine

## 2020-12-22 DIAGNOSIS — I10 Essential (primary) hypertension: Secondary | ICD-10-CM

## 2020-12-27 ENCOUNTER — Ambulatory Visit (INDEPENDENT_AMBULATORY_CARE_PROVIDER_SITE_OTHER): Payer: Medicare Other

## 2020-12-27 DIAGNOSIS — K219 Gastro-esophageal reflux disease without esophagitis: Secondary | ICD-10-CM

## 2020-12-27 DIAGNOSIS — I10 Essential (primary) hypertension: Secondary | ICD-10-CM

## 2020-12-27 NOTE — Patient Instructions (Addendum)
Visit Information: Thank you for taking the time to speak with me today. See below goals discussed today.  Patient Goals/Self-Care Activities: Patient will self administer medications as prescribed Patient will attend all scheduled provider appointments Patient will call provider office for new concerns or questions Review education provided on hypertension. Plan to receive a call from clinical pharmacist for medication review  Hypertension, Adult Hypertension is another name for high blood pressure. High blood pressure forces your heart to work harder to pump blood. This can cause problems over time. There are two numbers in a blood pressure reading. There is a top number (systolic) over a bottom number (diastolic). It is best to have a blood pressure that is below 120/80. Healthy choices can help lower your blood pressure, or you may need medicine to help lower it. What are the causes? The cause of this condition is not known. Some conditions may be related to high blood pressure. What increases the risk? Smoking. Having type 2 diabetes mellitus, high cholesterol, or both. Not getting enough exercise or physical activity. Being overweight. Having too much fat, sugar, calories, or salt (sodium) in your diet. Drinking too much alcohol. Having long-term (chronic) kidney disease. Having a family history of high blood pressure. Age. Risk increases with age. Race. You may be at higher risk if you are African American. Gender. Men are at higher risk than women before age 25. After age 25, women are at higher risk than men. Having obstructive sleep apnea. Stress. What are the signs or symptoms? High blood pressure may not cause symptoms. Very high blood pressure (hypertensive crisis) may cause: Headache. Feelings of worry or nervousness (anxiety). Shortness of breath. Nosebleed. A feeling of being sick to your stomach (nausea). Throwing up (vomiting). Changes in how you see. Very bad  chest pain. Seizures. How is this treated? This condition is treated by making healthy lifestyle changes, such as: Eating healthy foods. Exercising more. Drinking less alcohol. Your health care provider may prescribe medicine if lifestyle changes are not enough to get your blood pressure under control, and if: Your top number is above 130. Your bottom number is above 80. Your personal target blood pressure may vary. Follow these instructions at home: Eating and drinking  If told, follow the DASH eating plan. To follow this plan: Fill one half of your plate at each meal with fruits and vegetables. Fill one fourth of your plate at each meal with whole grains. Whole grains include whole-wheat pasta, brown rice, and whole-grain bread. Eat or drink low-fat dairy products, such as skim milk or low-fat yogurt. Fill one fourth of your plate at each meal with low-fat (lean) proteins. Low-fat proteins include fish, chicken without skin, eggs, beans, and tofu. Avoid fatty meat, cured and processed meat, or chicken with skin. Avoid pre-made or processed food. Eat less than 1,500 mg of salt each day. Do not drink alcohol if: Your doctor tells you not to drink. You are pregnant, may be pregnant, or are planning to become pregnant. If you drink alcohol: Limit how much you use to: 0-1 drink a day for women. 0-2 drinks a day for men. Be aware of how much alcohol is in your drink. In the [place] one drink equals one 12 oz bottle of beer (355 mL), one 5 oz glass of wine (148 mL), or one 1 oz glass of hard liquor (44 mL). Lifestyle  Work with your doctor to stay at a healthy weight or to lose weight. Ask your doctor what  the best weight is for you. Get at least 30 minutes of exercise most days of the week. This may include walking, swimming, or biking. Get at least 30 minutes of exercise that strengthens your muscles (resistance exercise) at least 3 days a week. This may include lifting weights or doing  Pilates. Do not use any products that contain nicotine or tobacco, such as cigarettes, e-cigarettes, and chewing tobacco. If you need help quitting, ask your doctor. Check your blood pressure at home as told by your doctor. Keep all follow-up visits as told by your doctor. This is important. Medicines Take over-the-counter and prescription medicines only as told by your doctor. Follow directions carefully. Do not skip doses of blood pressure medicine. The medicine does not work as well if you skip doses. Skipping doses also puts you at risk for problems. Ask your doctor about side effects or reactions to medicines that you should watch for. Contact a doctor if you: Think you are having a reaction to the medicine you are taking. Have headaches that keep coming back (recurring). Feel dizzy. Have swelling in your ankles. Have trouble with your vision. Get help right away if you: Get a very bad headache. Start to feel mixed up (confused). Feel weak or numb. Feel faint. Have very bad pain in your: Chest. Belly (abdomen). Throw up more than once. Have trouble breathing. Summary Hypertension is another name for high blood pressure. High blood pressure forces your heart to work harder to pump blood. For most people, a normal blood pressure is less than 120/80. Making healthy choices can help lower blood pressure. If your blood pressure does not get lower with healthy choices, you may need to take medicine. This information is not intended to replace advice given to you by your health care provider. Make sure you discuss any questions you have with your health care provider. Document Revised: 10/14/2017 Document Reviewed: 10/14/2017 Elsevier Patient Education  2022 [place].  How to Take Your Blood Pressure Blood pressure measures how strongly your blood is pressing against the walls of your arteries. Arteries are blood vessels that carry blood from your heart throughout your body. You  can take your blood pressure at home with a machine. You may need to check your blood pressure at home: To check if you have high blood pressure (hypertension). To check your blood pressure over time. To make sure your blood pressure medicine is working. Supplies needed: Blood pressure machine, or monitor. Dining room chair to sit in. Table or desk. Small notebook. Pencil or pen. How to prepare Avoid these things for 30 minutes before checking your blood pressure: Having drinks with caffeine in them, such as coffee or tea. Drinking alcohol. Eating. Smoking. Exercising. Do these things five minutes before checking your blood pressure: Go to the bathroom and pee (urinate). Sit in a dining chair. Do not sit on a soft couch or an armchair. Be quiet. Do not talk. How to take your blood pressure Follow the instructions that came with your machine. If you have a digital blood pressure monitor, these may be the instructions: Sit up straight. Place your feet on the floor. Do not cross your ankles or legs. Rest your left arm at the level of your heart. You may rest it on a table, desk, or chair. Pull up your shirt sleeve. Wrap the blood pressure cuff around the upper part of your left arm. The cuff should be 1 inch (2.5 cm) above your elbow. It is best to  wrap the cuff around bare skin. Fit the cuff snugly around your arm. You should be able to place only one finger between the cuff and your arm. Place the cord so that it rests in the bend of your elbow. Press the power button. Sit quietly while the cuff fills with air and loses air. Write down the numbers on the screen. Wait 2-3 minutes and then repeat steps 1-10. What do the numbers mean? Two numbers make up your blood pressure. The first number is called systolic pressure. The second is called diastolic pressure. An example of a blood pressure reading is "120 over 80" (or 120/80). If you are an adult and do not have a medical condition,  use this guide to find out if your blood pressure is normal: Normal First number: below 120. Second number: below 80. Elevated First number: 120-129. Second number: below 80. Hypertension stage 1 First number: 130-139. Second number: 80-89. Hypertension stage 2 First number: 140 or above. Second number: 1 or above. Your blood pressure is above normal even if only the first or only the second number is above normal. Follow these instructions at home: Medicines Take over-the-counter and prescription medicines only as told by your doctor. Tell your doctor if your medicine is causing side effects. General instructions Check your blood pressure as often as your doctor tells you to. Check your blood pressure at the same time every day. Take your monitor to your next doctor's appointment. Your doctor will: Make sure you are using it correctly. Make sure it is working right. Understand what your blood pressure numbers should be. Keep all follow-up visits as told by your doctor. This is important. General tips You will need a blood pressure machine, or monitor. Your doctor can suggest a monitor. You can buy one at a drugstore or online. When choosing one: Choose one with an arm cuff. Choose one that wraps around your upper arm. Only one finger should fit between your arm and the cuff. Do not choose one that measures your blood pressure from your wrist or finger. Where to find more information American Heart Association: www.heart.org Contact a doctor if: Your blood pressure keeps being high. Your blood pressure is suddenly low. Get help right away if: Your first blood pressure number is higher than 180. Your second blood pressure number is higher than 120. Summary Check your blood pressure at the same time every day. Avoid caffeine, alcohol, smoking, and exercise for 30 minutes before checking your blood pressure. Make sure you understand what your blood pressure numbers should  be. This information is not intended to replace advice given to you by your health care provider. Make sure you discuss any questions you have with your health care provider. Document Revised: 12/14/2019 Document Reviewed: 01/28/2019 Elsevier Patient Education  2022 [place].    PATIENT GOALS/PLAN OF CARE:  Care Plan : RN Care Manager Plan of Care  Updates made by Luretha Rued, RN since 12/27/2020 12:00 AM     Problem: Chronic disease Managment education and/or care coordination needs (HTN,GERD, Chronic Pain)   Priority: High     Long-Range Goal: Development of plan of care for Chronic Disease Management (HTN, GERD, Chronic Pain)   Start Date: 12/27/2020  Expected End Date: 04/26/2021  Priority: High  Note:   Current Barriers: patient reports history of HTN, GERD, chronic low back pain. However she reports GERD is controlled. She reports her pain is managed and denies pain at this time. She reports she  attends Pulmonary Therapy at [facility] in which she is able to reach her 150 minutes/week or aerobic exercise.   Care Coordination needs related to medication reconciliation Providers not listed in [facility] Chronic Disease Management support and education needs related to HTN and GERD Polypharmacy- Patient states she is also taking B12 injections as needed and Benfotiamin. RNCM Clinical Goal(s):  Patient will verbalize understanding of plan for management of HTN, GERD, and Chronic low back pain take all medications exactly as prescribed and will call provider for medication related questions work with pharmacist to address medication management related toHTN, GERD, and Chronic low back pain  through collaboration with RN Care manager, provider, and care team.   Interventions: 1:1 collaboration with primary care provider regarding development and update of comprehensive plan of care as evidenced by provider attestation  and co-signature Inter-disciplinary care team collaboration (see longitudinal plan of care) Evaluation of current treatment plan related to  self management and patient's adherence to plan as established by provider Pharmacy referral for medication reconciliation.  Hypertension Interventions: Long Term: New Goal Last practice recorded BP readings:  BP Readings from Last 3 Encounters:  12/21/20 140/82  12/11/20 (!) 151/70  11/08/20 (!) 157/54  Most recent eGFR/CrCl: No results found for: EGFR  No components found for: CRCL  Evaluation of current treatment plan related to hypertension self management and patient's adherence to plan as established by provider; Provided education to patient re: stroke prevention, s/s of heart attack and stroke; Reviewed medications with patient and discussed importance of compliance; Discussed plans with patient for ongoing care management follow up and provided patient with direct contact information for care management team; Clinical pharmacist referral for medication review, polypharmacy.  GERD Interventions: Long Term: New goal.  Evaluation of current treatment plan related to GERD,  and  self-management and patient's adherence to plan as established by provider. Discussed plans with patient for ongoing care management follow up and provided patient with direct contact information for care management team Advised patient to continue to take medications as prescribed; Encouraged patient to attend provider visits as scheduled.  Chronic Low Back Pain Interventions: Long Term: New Goal Pain assessment performed Medications reviewed Reviewed provider established plan for pain management; Discussed importance of adherence to all scheduled medical appointments;   Patient Goals/Self-Care Activities: Patient will self administer medications as prescribed Patient will attend all scheduled provider appointments Patient will call provider office for new  concerns or questions Review education provided on hypertension. Plan to receive a call from clinical pharmacist for medication review     Consent to CCM Services: Ms. Polka was given information about Chronic Care Management services including:  CCM service includes personalized support from designated clinical staff supervised by her physician, including individualized plan of care and coordination with other care providers 24/7 contact phone numbers for assistance for urgent and routine care needs. Service will only be billed when office clinical staff spend 20 minutes or more in a month to coordinate care. Only one practitioner may furnish and bill the service in a calendar month. The patient may stop CCM services at any time (effective at the end of the month) by phone call to the office staff. The patient will be responsible for cost sharing (co-pay) of up to 20% of the service fee (after annual deductible is met).  Patient agreed to services and verbal consent obtained.   Patient verbalizes understanding of instructions provided today and agrees to view in [place].  Telephone follow up appointment with care management team member scheduled for: 01/25/21 The patient has been provided with contact information for the care management team and has been advised to call with any health related questions or concerns.   Thea Silversmith, RN, MSN, BSN, CCM Care Management Coordinator [facility] [phone]

## 2020-12-27 NOTE — Chronic Care Management (AMB) (Signed)
Chronic Care Management   CCM RN Visit Note  12/27/2020 Name: PATRICIANN BECHT MRN: [id number] DOB: 05-18-1940  Subjective: Lesia Sago is a 80 y.o. year old female who is a primary care patient of Copland, Gay Filler, MD. The care management team was consulted for assistance with disease management and care coordination needs.    Engaged with patient by telephone for initial visit in response to provider referral for case management and/or care coordination services.   Consent to Services:  The patient was given the following information about Chronic Care Management services today, agreed to services, and gave verbal consent: 1. CCM service includes personalized support from designated clinical staff supervised by the primary care provider, including individualized plan of care and coordination with other care providers 2. 24/7 contact phone numbers for assistance for urgent and routine care needs. 3. Service will only be billed when office clinical staff spend 20 minutes or more in a month to coordinate care. 4. Only one practitioner may furnish and bill the service in a calendar month. 5.The patient may stop CCM services at any time (effective at the end of the month) by phone call to the office staff. 6. The patient will be responsible for cost sharing (co-pay) of up to 20% of the service fee (after annual deductible is met). Patient agreed to services and consent obtained.  Patient agreed to services and verbal consent obtained.   Assessment: Review of patient past medical history, allergies, medications, health status, including review of consultants reports, laboratory and other test data, was performed as part of comprehensive evaluation and provision of chronic care management services.   SDOH (Social Determinants of Health) assessments and interventions performed:  SDOH Interventions    Flowsheet Row Most Recent Value  SDOH Interventions   Food Insecurity Interventions  Intervention Not Indicated  Transportation Interventions Intervention Not Indicated        CCM Care Plan  Allergies  Allergen Reactions   Cephalosporins Shortness Of Breath    Breathing difficulty   Ciprofloxacin Hives and Shortness Of Breath   Influenza Virus Vaccine Other (See Comments)    Numbness in legs   Keflex [Cephalexin] Hives   Latex Hives, Itching and Rash   Levaquin [Levofloxacin] Hives, Palpitations and Other (See Comments)    Chest pain   Nitrofuran Derivatives Hives, Itching and Swelling   Penicillins Hives   Sulfacetamide Sodium Hives   Xopenex [Levalbuterol Hcl] Anxiety and Other (See Comments)    Tremors with violent body chattering and shaking   Zostavax [Zoster Vaccine Live] Other (See Comments)    Pt has been told by specialists not to receive vaccine d/t h/o herpes in bloodstream and shingles.   Erythromycin Other (See Comments)    "stomach issues"   Hydrochlorothiazide Other (See Comments)    Migraine   Lisinopril Cough   Petrolatum Nausea Only   Pregabalin Other (See Comments)   Ropinirole Hcl Nausea And Vomiting   Shellfish Allergy Hives   Sulfa Antibiotics Hives   Aspirin Other (See Comments)    Stomach pain/burning   Avelox [Moxifloxacin Hcl In Nacl] Nausea Only   Cefdinir Nausea And Vomiting   Chromium Other (See Comments)    Blisters   Eggs Or Egg-Derived Products     Per allergy testing, no known reaction. Pt states she was told she could probably eat 2-3 eggs weekly w/o issue.   Iodinated Diagnostic Agents Other (See Comments)    unknown    Lactose Intolerance (Gi) Other (See  Comments)    "stomach issues"   Percodan [Oxycodone-Aspirin] Nausea And Vomiting   Prednisone Palpitations    No injections, can do the pills   Troleandomycin Other (See Comments)    Unknown reaction    Outpatient Encounter Medications as of 12/27/2020  Medication Sig Note   ALPRAZolam (XANAX) 0.25 MG tablet Take 1 tablet (0.25 mg total) by mouth 2 (two)  times daily as needed for anxiety.    Ascorbic Acid (VITAMIN C) 100 MG tablet Take 1,000 mg by mouth daily. Reported on 05/24/2015    butalbital-acetaminophen-caffeine (FIORICET, ESGIC) 50-325-40 MG per tablet Take by mouth 2 (two) times daily as needed for headache.    Cholecalciferol 125 MCG (5000 UT) TABS daily at 0600.    clindamycin (CLEOCIN) 150 MG capsule 450 mg 3 (three) times daily. 1 hour prior to dental procedures and 6 hours post dental procedure    co-enzyme Q-10 30 MG capsule Take 30 mg by mouth daily. 12/27/2020: Takes as needed.   cyclobenzaprine (FLEXERIL) 5 MG tablet Take 5 mg by mouth 2 (two) times daily.    diclofenac Sodium (VOLTAREN) 1 % GEL APPLY 2 GRAMS EXTERNALLY TO THE AFFECTED AREA FOUR TIMES DAILY    FLUoxetine (PROZAC) 20 MG tablet TAKE 1/2 TABLET BY MOUTH EVERY DAY FOR 10 DAYS, THE 1 TABLET EVERY DAY FOR 10 DAYS, THEN 1& 1/2 TABLETS EVERY DAY (Patient taking differently: Take 10 mg by mouth daily.)    fluticasone (FLONASE) 50 MCG/ACT nasal spray Place 2 sprays into both nostrils daily. 12/27/2020: Takes as needed   furosemide (LASIX) 20 MG tablet TAKE 1 TABLET BY MOUTH EVERY DAY ON OCCASION AS NEEDED FOR SWELLING    HYDROcodone-acetaminophen (NORCO/VICODIN) 5-325 MG tablet Take 1 tablet by mouth every 6 (six) hours as needed.    nebivolol (BYSTOLIC) 10 MG tablet TAKE 1 TABLET(10 MG) BY MOUTH DAILY. Pt needs a office visit for more refills.    NYSTATIN powder Apply topically 2 (two) times daily. As needed    omeprazole (PRILOSEC OTC) 20 MG tablet Take 30 mg by mouth daily.    Prednicarbate 0.1 % CREA as needed.    promethazine (PHENERGAN) 25 MG tablet Take 1 tablet (25 mg total) by mouth every 6 (six) hours as needed for nausea or vomiting.    sodium chloride (V-R NASAL SPRAY SALINE) 0.65 % nasal spray Place 1 spray into the nose as needed. Use in both nostrils twice daily    triamcinolone cream (KENALOG) 0.1 % Apply topically 2 (two) times daily. As needed    valsartan  (DIOVAN) 160 MG tablet TAKE 1 AND 1/2 TABLETS(240 MG) BY MOUTH DAILY    albuterol (VENTOLIN HFA) 108 (90 Base) MCG/ACT inhaler Inhale 1-2 puffs into the lungs every 6 (six) hours as needed for wheezing or shortness of breath. (Patient not taking: No sig reported)    atenolol (TENORMIN) 25 MG tablet Take 1 tablet (25 mg total) by mouth daily. (Patient not taking: No sig reported)    clopidogrel (PLAVIX) 75 MG tablet Take 1 tablet (75 mg total) by mouth daily. (Patient not taking: No sig reported)    doxycycline (VIBRAMYCIN) 100 MG capsule Take 1 capsule (100 mg total) by mouth 2 (two) times daily. (Patient not taking: No sig reported)    erythromycin ophthalmic ointment 1 application. As needed (Patient not taking: Reported on 12/27/2020)    guaiFENesin-dextromethorphan (ROBITUSSIN DM) 100-10 MG/5ML syrup Take 5 mLs by mouth 3 (three) times daily as needed for cough. (Patient not  taking: No sig reported)    lidocaine (LIDODERM) 5 % Place 1 patch onto the skin.  (Patient not taking: No sig reported)    LORazepam (ATIVAN) 1 MG tablet SMARTSIG:2 Tablet(s) By Mouth (Patient not taking: No sig reported)    ondansetron (ZOFRAN-ODT) 4 MG disintegrating tablet Take 2 mg by mouth every 6 (six) hours as needed. (Patient not taking: No sig reported)    No facility-administered encounter medications on file as of 12/27/2020.    Patient Active Problem List   Diagnosis Date Noted   IDA (iron deficiency anemia) 01/11/2020   Psoriatic arthritis ([place]) 12/07/2019   Psoriasis 12/07/2019   Osteoarthritis 12/07/2019   TIA (transient ischemic attack) 05/18/2019   Restrictive lung disease 05/13/2016   Moderate persistent asthma without complication 09/30/4816   Current use of beta blocker 04/11/2016   Other allergic rhinitis 04/11/2016   Gastroesophageal reflux disease without esophagitis 04/11/2016   Essential hypertension 02/20/2016   Abdominal pain, acute, right upper quadrant 07/29/2013    Conditions to be  addressed/monitored:HTN, GERD, and Chronic Low Back Pain  Care Plan : RN Care Manager Plan of Care  Updates made by Luretha Rued, RN since 12/27/2020 12:00 AM     Problem: Chronic disease Managment education and/or care coordination needs (HTN,GERD, Chronic Pain)   Priority: High     Long-Range Goal: Development of plan of care for Chronic Disease Management (HTN, GERD, Chronic Pain)   Start Date: 12/27/2020  Expected End Date: 04/26/2021  Priority: High  Note:   Current Barriers: patient reports history of HTN, GERD, chronic low back pain. However she reports GERD is controlled. She reports her pain is managed and denies pain at this time. She reports she attends Pulmonary Therapy at [facility] in which she is able to reach her 150 minutes/week or aerobic exercise.   Care Coordination needs related to medication reconciliation Providers not listed in [facility] Chronic Disease Management support and education needs related to HTN and GERD Polypharmacy- Patient states she is also taking B12 injections as needed and Benfotiamin. RNCM Clinical Goal(s):  Patient will verbalize understanding of plan for management of HTN, GERD, and Chronic low back pain take all medications exactly as prescribed and will call provider for medication related questions work with pharmacist to address medication management related toHTN, GERD, and Chronic low back pain  through collaboration with RN Care manager, provider, and care team.   Interventions: 1:1 collaboration with primary care provider regarding development and update of comprehensive plan of care as evidenced by provider attestation and co-signature Inter-disciplinary care team collaboration (see longitudinal plan of care) Evaluation of current treatment plan related to  self management and patient's adherence to plan as established by provider Pharmacy referral for medication  reconciliation.  Hypertension Interventions: Long Term: New Goal Last practice recorded BP readings:  BP Readings from Last 3 Encounters:  12/21/20 140/82  12/11/20 (!) 151/70  11/08/20 (!) 157/54  Most recent eGFR/CrCl: No results found for: EGFR  No components found for: CRCL  Evaluation of current treatment plan related to hypertension self management and patient's adherence to plan as established by provider; Provided education to patient re: stroke prevention, s/s of heart attack and stroke; Reviewed medications with patient and discussed importance of compliance; Discussed plans with patient for ongoing care management follow up and provided patient with direct contact information for care management team; Clinical pharmacist referral for medication review, polypharmacy.  GERD Interventions: Long Term: New goal.  Evaluation of current treatment plan related to GERD,  and  self-management and patient's adherence to plan as established by provider. Discussed plans with patient for ongoing care management follow up and provided patient with direct contact information for care management team Advised patient to continue to take medications as prescribed; Encouraged patient to attend provider visits as scheduled.  Chronic Low Back Pain Interventions: Long Term: New Goal Pain assessment performed Medications reviewed Reviewed provider established plan for pain management; Discussed importance of adherence to all scheduled medical appointments;   Patient Goals/Self-Care Activities: Patient will self administer medications as prescribed Patient will attend all scheduled provider appointments Patient will call provider office for new concerns or questions Review education provided on hypertension. Plan to receive a call from clinical pharmacist for medication review   Plan:Telephone follow up appointment with care management team member scheduled for:  next month The patient has been  provided with contact information for the care management team and has been advised to call with any health related questions or concerns.    Thea Silversmith, RN, MSN, BSN, CCM Care Management Coordinator [facility] [phone]

## 2021-01-01 ENCOUNTER — Telehealth: Payer: Self-pay | Admitting: *Deleted

## 2021-01-01 NOTE — Chronic Care Management (AMB) (Signed)
  Chronic Care Management   Note  01/01/2021 Name: COLLIN HENDLEY MRN: [id number] DOB: 1941-01-08  Oswaldo Conroy Hajduk is a 80 y.o. year old female who is a primary care patient of Copland, Gay Filler, MD. LINELL MELDRUM is currently enrolled in care management services. An additional referral for Pharm D was placed.   Follow up plan: Unsuccessful telephone outreach attempt made. A HIPAA compliant phone message was left for the patient providing contact information and requesting a return call.   Julian Hy, [place] Management  Direct Dial: [phone]

## 2021-01-09 NOTE — Chronic Care Management (AMB) (Signed)
  Chronic Care Management   Note  01/09/2021 Name: Angela Gibbs MRN: [id number] DOB: January 15, 1941  Angela Gibbs is a 80 y.o. year old female who is a primary care patient of Copland, Gay Filler, MD. Angela Gibbs is currently enrolled in care management services. An additional referral for Pharm D was placed.   Follow up plan: Telephone appointment with care management team member scheduled for: 01/25/2021  Julian Hy, [place] Management  Direct Dial: [phone]

## 2021-01-15 ENCOUNTER — Telehealth: Payer: Self-pay | Admitting: *Deleted

## 2021-01-15 NOTE — Chronic Care Management (AMB) (Signed)
  Care Management   Note  01/15/2021 Name: Angela Gibbs MRN: [id number] DOB: 1940-11-02  Angela Gibbs is a 80 y.o. year old female who is a primary care patient of Copland, Gay Filler, MD and is actively engaged with the care management team. I reached out to Lesia Sago by phone today to assist with re-scheduling a follow up visit with the RN Case Manager  Follow up plan: Unsuccessful telephone outreach attempt made. A HIPAA compliant phone message was left for the patient providing contact information and requesting a return call.   Julian Hy, [place] Management  Direct Dial: [phone]

## 2021-01-16 ENCOUNTER — Ambulatory Visit (INDEPENDENT_AMBULATORY_CARE_PROVIDER_SITE_OTHER): Payer: Medicare Other | Admitting: Pulmonary Disease

## 2021-01-16 ENCOUNTER — Encounter: Payer: Self-pay | Admitting: Pulmonary Disease

## 2021-01-16 ENCOUNTER — Other Ambulatory Visit: Payer: Self-pay

## 2021-01-16 DIAGNOSIS — J984 Other disorders of lung: Secondary | ICD-10-CM

## 2021-01-16 DIAGNOSIS — J4540 Moderate persistent asthma, uncomplicated: Secondary | ICD-10-CM | POA: Diagnosis not present

## 2021-01-16 NOTE — Patient Instructions (Signed)
Keep up with your exercise plan

## 2021-01-16 NOTE — Progress Notes (Signed)
   Subjective:    Patient ID: Angela Gibbs, female    DOB: 17-Apr-1940, 80 y.o.   MRN: [id number]  HPI  80 yo never smoker for FU  of restrictive lung disease  PMH - scoliosis  -moderate hiatal hernia -hyponatremia -Psoriatec arthritis   She reports an allergic reaction to penicillin 2013  and onset of asthma since then. She denies childhood history of asthma.     She presented with shortness of breath in 2018 which improved after she discontinued fentanyl patch. Dyspnea recurred in 2019 and she was referred to pulmonary rehab, had dramatic improvement in her shortness of breath and she was able to walk 2 blocks. After her last visit 09/2019 I referred her to pulmonary rehab again, she started at [facility] and has made good progress and has continued with their maintenance program.  Shortness of breath is manageable. She reports rare use of albuterol MDI and has not needed Xopenex nebs at all.  In the interim, she was diagnosed with psoriatic arthritis She had COVID infection in April 2022 but did not require hospitalization  We reviewed HRCT results today  Significant tests/ events reviewed   HRCT 09/2019 Moderate patchy air trapping in both lungs, indicative of small airways disease. mild cylindrical bronchiectasis in both lungs.   Spirometry 03/2016 showed a ratio of 81 with FEV1 of 55% and FVC of 51%  Spirometry 04/2016 and again showed a ratio of 89 with FEV1 of 54% and FVC of 45% again indicating moderate restriction Both these spirometry shows suboptimal efforts of 2-3 seconds  Review of Systems neg for any significant sore throat, dysphagia, itching, sneezing, nasal congestion or excess/ purulent secretions, fever, chills, sweats, unintended wt loss, pleuritic or exertional cp, hempoptysis, orthopnea pnd or change in chronic leg swelling. Also denies presyncope, palpitations, heartburn, abdominal pain, nausea, vomiting, diarrhea or change in bowel or urinary habits,  dysuria,hematuria, rash, arthralgias, visual complaints, headache, numbness weakness or ataxia.     Objective:   Physical Exam  Gen. Pleasant, obese, in no distress ENT - no lesions, no post nasal drip Neck: No JVD, no thyromegaly, no carotid bruits Lungs: no use of accessory muscles, no dullness to percussion, decreased without rales or rhonchi  Cardiovascular: Rhythm regular, heart sounds  normal, no murmurs or gallops, no peripheral edema Musculoskeletal: No deformities, no cyanosis or clubbing , no tremors, scoliosis       Assessment & Plan:

## 2021-01-16 NOTE — Assessment & Plan Note (Signed)
Restriction related to scoliosis.  She has benefited immensely from maintenance pulmonary rehab and plans to continue this indicating that there was only big element of deconditioning to her persistent dyspnea HRCT does not show any evidence of fibrotic ILD, mild air trapping is noted and mild bronchiectasis for which she does not appear to be symptomatic

## 2021-01-16 NOTE — Assessment & Plan Note (Signed)
Appears to be mild intermittent currently. She does not seem to require inhaled steroids

## 2021-01-18 ENCOUNTER — Telehealth: Payer: Self-pay | Admitting: Pharmacist

## 2021-01-18 NOTE — Chronic Care Management (AMB) (Signed)
Chronic Care Management Pharmacy Assistant   Name: Angela Gibbs  MRN: [id number] DOB: [birth year]  Angela Gibbs is an 80 y.o. year old female who presents for his initial CCM visit with the clinical pharmacist.  Recent office visits:  None noted  Recent consult visits:  01/16/21 (Pulmonology)- [name]. [name], MD. Follow up visit for restrictive lung disease. Follow up in 1 year. 12/11/20 (Oncology)- [name], NP. Follow up visit for iron deficiency anemia. Follow up in 6 months. 11/13/20 (Pain medicine) [name], ANP. Seen for back pain. Follow up in 3 months. 08/16/20 (Pain medicine) [name], ANP. Seen for leg pain. Follow up in 3 months.  Hospital visits:  None in previous 6 months  Medications: Outpatient Encounter Medications as of 01/18/2021  Medication Sig Note   albuterol (VENTOLIN HFA) 108 (90 Base) MCG/ACT inhaler Inhale 1-2 puffs into the lungs every 6 (six) hours as needed for wheezing or shortness of breath.    ALPRAZolam (XANAX) 0.25 MG tablet Take 1 tablet (0.25 mg total) by mouth 2 (two) times daily as needed for anxiety.    Ascorbic Acid (VITAMIN C) 100 MG tablet Take 1,000 mg by mouth daily. Reported on 05/24/2015    butalbital-acetaminophen-caffeine (FIORICET, ESGIC) 50-325-40 MG per tablet Take by mouth 2 (two) times daily as needed for headache.    Cholecalciferol 125 MCG (5000 UT) TABS daily at 0600.    clindamycin (CLEOCIN) 150 MG capsule 450 mg 3 (three) times daily. 1 hour prior to dental procedures and 6 hours post dental procedure (Patient not taking: Reported on 01/16/2021)    cyclobenzaprine (FLEXERIL) 5 MG tablet Take 5 mg by mouth 2 (two) times daily.    diclofenac Sodium (VOLTAREN) 1 % GEL APPLY 2 GRAMS EXTERNALLY TO THE AFFECTED AREA FOUR TIMES DAILY    FLUoxetine (PROZAC) 20 MG tablet TAKE 1/2 TABLET BY MOUTH EVERY DAY FOR 10 DAYS, THE 1 TABLET EVERY DAY FOR 10 DAYS, THEN 1& 1/2 TABLETS EVERY DAY (Patient taking  differently: Take 10 mg by mouth daily.)    fluticasone (FLONASE) 50 MCG/ACT nasal spray Place 2 sprays into both nostrils daily. 12/27/2020: Takes as needed   furosemide (LASIX) 20 MG tablet TAKE 1 TABLET BY MOUTH EVERY DAY ON OCCASION AS NEEDED FOR SWELLING    HYDROcodone-acetaminophen (NORCO/VICODIN) 5-325 MG tablet Take 1 tablet by mouth every 6 (six) hours as needed. (Patient not taking: Reported on 01/16/2021)    lidocaine (LIDODERM) 5 % Place 1 patch onto the skin.  (Patient not taking: Reported on 10/09/2020)    nebivolol (BYSTOLIC) 10 MG tablet TAKE 1 TABLET(10 MG) BY MOUTH DAILY. Pt needs a office visit for more refills.    NYSTATIN powder Apply topically 2 (two) times daily. As needed    omeprazole (PRILOSEC OTC) 20 MG tablet Take 30 mg by mouth daily.    Prednicarbate 0.1 % CREA as needed.    promethazine (PHENERGAN) 25 MG tablet Take 1 tablet (25 mg total) by mouth every 6 (six) hours as needed for nausea or vomiting.    sodium chloride (V-R NASAL SPRAY SALINE) 0.65 % nasal spray Place 1 spray into the nose as needed. Use in both nostrils twice daily    triamcinolone cream (KENALOG) 0.1 % Apply topically 2 (two) times daily. As needed (Patient not taking: Reported on 01/16/2021)    valsartan (DIOVAN) 160 MG tablet TAKE 1 AND 1/2 TABLETS(240 MG) BY MOUTH DAILY (Patient not taking: Reported on 01/16/2021)  No facility-administered encounter medications on file as of 01/18/2021.   Albuterol (VENTOLIN HFA) 108 (90 Base) MCG/ACT inhaler Last filled:09/25/20 25 DS ALPRAZolam (XANAX) 0.25 MG tablet Last filled:10/17/19 15 DS Ascorbic Acid (VITAMIN C) 100 MG tablet Last filled:None noted Butalbital-acetaminophen-caffeine (FIORICET, ESGIC) 50-325-40 MG per tablet Last filled:09/16/20 30 DS Cholecalciferol 125 MCG (5000 UT) TABS Last filled:None noted Clindamycin (CLEOCIN) 150 MG capsule Last filled:05/03/20 2 DS Cyclobenzaprine (FLEXERIL) 5 MG tablet Last filled:09/06/20 90 DS Diclofenac  Sodium (VOLTAREN) 1 % GEL Last filled:02/22/19 7 DS FLUoxetine (PROZAC) 20 MG tablet Last filled:08/28/20 90 DS Fluticasone (FLONASE) 50 MCG/ACT nasal spray Last filled:None noted Furosemide (LASIX) 20 MG tablet Last filled:12/07/19 90 DS HYDROcodone-acetaminophen (NORCO/VICODIN) 5-325 MG tablet Last filled:05/03/20 2 DS Lidocaine (LIDODERM) 5 % Last filled:05/23/19 30 DS Nebivolol (BYSTOLIC) 10 MG tablet Last filled:12/24/20 90 DS NYSTATIN powder Last filled:02/03/20 25 DS Omeprazole (PRILOSEC OTC) 20 MG tablet Last filled:None noted Prednicarbate 0.1 % CREA Last filled:None noted Promethazine (PHENERGAN) 25 MG tablet Last filled:None noted Sodium chloride (V-R NASAL SPRAY SALINE) 0.65 % nasal spray Last filled:None noted Triamcinolone cream (KENALOG) 0.1 % Last filled:08/27/20 30 DS Valsartan (DIOVAN) 160 MG tablet Last filled:10/23/20 90 DS   Care Gaps: Zoster Vaccines- Shingrix:Never done COVID-19 Vaccine:Overdue since 04/27/2019  Star Rating Drugs: Valsartan 10 mg Last filled:10/23/20 90 DS  [name], [place]

## 2021-01-21 ENCOUNTER — Other Ambulatory Visit: Payer: Self-pay | Admitting: Family Medicine

## 2021-01-21 DIAGNOSIS — I10 Essential (primary) hypertension: Secondary | ICD-10-CM

## 2021-01-22 NOTE — Chronic Care Management (AMB) (Signed)
  Care Management   Note  01/22/2021 Name: Angela Gibbs MRN: [id number] DOB: 26-May-1940  Angela Gibbs is a 80 y.o. year old female who is a primary care patient of Copland, Gay Filler, MD and is actively engaged with the care management team. I reached out to Lesia Sago by phone today to assist with re-scheduling a follow up visit with the RN Case Manager  Follow up plan: Successful contact was made however pt was busy and will return my call at a later time   Julian Hy, [place], [place] Management  Direct Dial: [phone]

## 2021-01-22 NOTE — Chronic Care Management (AMB) (Signed)
  Care Management   Note  01/22/2021 Name: KAILANI BRASS MRN: [id number] DOB: [birth year]  Oswaldo Conroy Carrell is a 80 y.o. year old female who is a primary care patient of Copland, Gay Filler, MD and is actively engaged with the care management team. I reached out to Lesia Sago by phone today to assist with re-scheduling a follow up visit with the RN Case Manager  Follow up plan: Telephone appointment with care management team member scheduled for: 01/28/2021  Julian Hy, [place], [place] Management  Direct Dial: [phone]

## 2021-01-25 ENCOUNTER — Ambulatory Visit: Payer: Medicare Other | Admitting: Pharmacist

## 2021-01-25 ENCOUNTER — Telehealth: Payer: Medicare Other

## 2021-01-25 DIAGNOSIS — D649 Anemia, unspecified: Secondary | ICD-10-CM

## 2021-01-25 DIAGNOSIS — F410 Panic disorder [episodic paroxysmal anxiety] without agoraphobia: Secondary | ICD-10-CM

## 2021-01-25 DIAGNOSIS — K219 Gastro-esophageal reflux disease without esophagitis: Secondary | ICD-10-CM

## 2021-01-25 DIAGNOSIS — F329 Major depressive disorder, single episode, unspecified: Secondary | ICD-10-CM

## 2021-01-25 DIAGNOSIS — E871 Hypo-osmolality and hyponatremia: Secondary | ICD-10-CM

## 2021-01-25 DIAGNOSIS — I10 Essential (primary) hypertension: Secondary | ICD-10-CM

## 2021-01-25 DIAGNOSIS — M1990 Unspecified osteoarthritis, unspecified site: Secondary | ICD-10-CM

## 2021-01-28 ENCOUNTER — Telehealth: Payer: Medicare Other

## 2021-01-28 NOTE — Chronic Care Management (AMB) (Signed)
Chronic Care Management Pharmacy Note  01/28/2021 Name:  Angela Gibbs MRN:  [id number] DOB:  [birth year]  Subjective: Angela Gibbs is an 80 y.o. year old female who is a primary patient of Copland, Gay Filler, MD.  The CCM team was consulted for assistance with disease management and care coordination needs.    Engaged with patient by telephone for initial visit in response to provider referral for pharmacy case management and/or care coordination services.   Consent to Services:  The patient was given the following information about Chronic Care Management services today, agreed to services, and gave verbal consent: 1. CCM service includes personalized support from designated clinical staff supervised by the primary care provider, including individualized plan of care and coordination with other care providers 2. 24/7 contact phone numbers for assistance for urgent and routine care needs. 3. Service will only be billed when office clinical staff spend 20 minutes or more in a month to coordinate care. 4. Only one practitioner may furnish and bill the service in a calendar month. 5.The patient may stop CCM services at any time (effective at the end of the month) by phone call to the office staff. 6. The patient will be responsible for cost sharing (co-pay) of up to 20% of the service fee (after annual deductible is met). Patient agreed to services and consent obtained.  Patient Care Team: Copland, Gay Filler, MD as PCP - General (Family Medicine) Luellen Pucker, MD as Consulting Physician (Obstetrics and Gynecology) Arta Bruce, OD as Consulting Physician (Ophthalmology) Sabra Heck Precious Haws, MD as Consulting Physician (Neurology) Sylvie Farrier, NP as Consulting Physician (Pain Medicine) Rolan Lipa, MD as Consulting Physician (Gastroenterology) Kerney Elbe, MD as Consulting Physician (Internal Medicine) Lucia Bitter., MD as Physician Assistant  (Pain Medicine) Luretha Rued, RN as Case Manager Cherre Robins, RPH-CPP (Pharmacist)  Recent office visits: 01/04/2020 - Fam Med (Dr Lorelei Pont) Video Visit for bronchitis.  Prescribed doxycycline 138m twice a day.   Recent consult visits: 01/16/2021 - Pulm (Dr AElsworth Soho F/u Moderate persistent asthma. Several meds removed from list due to patient not taking - atenolol, clopidogrel, lorazepam. No new medications started.  Spirometry 03/2016 showed a ratio of 81 with FEV1 of 55% and FVC of 51% Spirometry 04/2016 and again showed a ratio of 89 with FEV1 of 54% and FVC of 45% again indicating moderate restriction Both these spirometry shows suboptimal efforts of 2-3 seconds 12/11/2020 - Hematology (Eulas Post NP) seen for iron deficiancy anemia.  Labs checked; Will replace iron as indicated.  11/13/2020 - Pain Management ([facility], ANP - [facility]) see for chronic low back pian and post herpetic neuralgia.  07/13/2020 - psoriatic arthropathy (Dr HTrudie Reed   Hospital visits: None in previous 6 months  Objective:  Lab Results  Component Value Date   CREATININE 0.71 11/06/2020   CREATININE 0.72 01/10/2020   CREATININE 0.76 12/07/2019    Lab Results  Component Value Date   HGBA1C 6.1 08/05/2017   Last diabetic Eye exam: No results found for: HMDIABEYEEXA  Last diabetic Foot exam: No results found for: HMDIABFOOTEX      Component Value Date/Time   CHOL 194 07/19/2018 1527   TRIG 74.0 07/19/2018 1527   HDL 69.30 07/19/2018 1527   CHOLHDL 3 07/19/2018 1527   VLDL 14.8 07/19/2018 1527   LDLCALC 110 (H) 07/19/2018 1527    Hepatic Function Latest Ref Rng & Units 11/06/2020 01/10/2020 12/07/2019  Total Protein 6.5 - 8.1 g/dL 7.0 7.2 6.7  Albumin  3.5 - 5.0 g/dL 3.9 3.9 -  AST 15 - 41 U/L 21 18 14   ALT 0 - 44 U/L 14 18 15   Alk Phosphatase 38 - 126 U/L 97 66 -  Total Bilirubin 0.3 - 1.2 mg/dL 0.3 0.2(L) 0.3    Lab Results  Component Value Date/Time   TSH 2.93 05/26/2019 12:00 AM     CBC Latest Ref Rng & Units 12/11/2020 11/06/2020 06/12/2020  WBC 4.0 - 10.5 K/uL 5.6 6.0 4.7  Hemoglobin 12.0 - 15.0 g/dL 13.0 10.2(L) 12.4  Hematocrit 36.0 - 46.0 % 39.5 31.2(L) 36.3  Platelets 150 - 400 K/uL 224 289 235    No results found for: VD25OH  Clinical ASCVD: Yes  The ASCVD Risk score (Arnett DK, et al., 2019) failed to calculate for the following reasons:   The 2019 ASCVD risk score is only valid for ages 48 to 69    Other: (CHADS2VASc if Afib, PHQ9 if depression, MMRC or CAT for COPD, ACT, DEXA)  Social History   Tobacco Use  Smoking Status Never  Smokeless Tobacco Never   BP Readings from Last 3 Encounters:  01/16/21 110/80  12/21/20 140/82  12/11/20 (!) 151/70   Pulse Readings from Last 3 Encounters:  01/16/21 63  12/21/20 (!) 57  12/11/20 (!) 57   Wt Readings from Last 3 Encounters:  01/16/21 179 lb (81.2 kg)  12/11/20 176 lb (79.8 kg)  10/09/20 173 lb (78.5 kg)    Assessment: Review of patient past medical history, allergies, medications, health status, including review of consultants reports, laboratory and other test data, was performed as part of comprehensive evaluation and provision of chronic care management services.   SDOH:  (Social Determinants of Health) assessments and interventions performed:  SDOH Interventions    Flowsheet Row Most Recent Value  SDOH Interventions   Financial Strain Interventions Other (Comment)  [recommended she use OTC lidicaine 4%]       CCM Care Plan  Allergies  Allergen Reactions   Cephalosporins Shortness Of Breath    Breathing difficulty   Ciprofloxacin Hives and Shortness Of Breath   Influenza Virus Vaccine Other (See Comments)    Numbness in legs   Keflex [Cephalexin] Hives   Latex Hives, Itching and Rash   Levaquin [Levofloxacin] Hives, Palpitations and Other (See Comments)    Chest pain   Nitrofuran Derivatives Hives, Itching and Swelling   Penicillins Hives   Sulfacetamide Sodium Hives    Xopenex [Levalbuterol Hcl] Anxiety and Other (See Comments)    Tremors with violent body chattering and shaking   Zostavax [Zoster Vaccine Live] Other (See Comments)    Pt has been told by specialists not to receive vaccine d/t h/o herpes in bloodstream and shingles.   Erythromycin Other (See Comments)    "stomach issues"   Hydrochlorothiazide Other (See Comments)    Migraine   Lisinopril Cough   Petrolatum Nausea Only   Pregabalin Other (See Comments)   Ropinirole Hcl Nausea And Vomiting   Shellfish Allergy Hives   Sulfa Antibiotics Hives   Aspirin Other (See Comments)    Stomach pain/burning   Avelox [Moxifloxacin Hcl In Nacl] Nausea Only   Cefdinir Nausea And Vomiting   Chromium Other (See Comments)    Blisters   Eggs Or Egg-Derived Products     Per allergy testing, no known reaction. Pt states she was told she could probably eat 2-3 eggs weekly w/o issue.   Iodinated Diagnostic Agents Other (See Comments)    unknown  Lactose Intolerance (Gi) Other (See Comments)    "stomach issues"   Percodan [Oxycodone-Aspirin] Nausea And Vomiting   Prednisone Palpitations    No injections, can do the pills   Troleandomycin Other (See Comments)    Unknown reaction    Medications Reviewed Today     Reviewed by Cherre Robins, RPH-CPP (Pharmacist) on 01/25/21 at [place] List Status: <None>   Medication Order Taking? Sig Documenting Provider Last Dose Status Informant  ALPRAZolam (XANAX) 0.25 MG tablet [id number] Yes Take 1 tablet (0.25 mg total) by mouth 2 (two) times daily as needed for anxiety. Copland, Gay Filler, MD Taking Active   Ascorbic Acid (VITAMIN C) 100 MG tablet [id number] Yes Take 1,000 mg by mouth daily. Reported on 05/24/2015 [provider] Taking Active   butalbital-acetaminophen-caffeine (FIORICET, ESGIC) 50-325-40 MG per tablet [id number] Yes Take 2 tablets by mouth 2 (two) times daily as needed for headache. [provider] Taking Active   Cholecalciferol  125 MCG (5000 UT) TABS [id number] Yes daily at 0600. [provider] Taking Active            Med Note (CANTER, [facility] D   Wed Aug 31, 2019  9:03 AM)    clindamycin (CLEOCIN) 150 MG capsule [id number] Yes 450 mg 3 (three) times daily. 1 hour prior to dental procedures and 6 hours post dental procedure [provider] Taking Active   cyclobenzaprine (FLEXERIL) 5 MG tablet [id number] Yes Take 5 mg by mouth 2 (two) times daily as needed. [provider] Taking Active   diclofenac Sodium (VOLTAREN) 1 % GEL [id number] Yes APPLY 2 GRAMS EXTERNALLY TO THE AFFECTED AREA FOUR TIMES DAILY Mcarthur Rossetti, MD Taking Active   FLUoxetine (PROZAC) 20 MG tablet [id number] No TAKE 1/2 TABLET BY MOUTH EVERY DAY FOR 10 DAYS, THE 1 TABLET EVERY DAY FOR 10 DAYS, THEN 1& 1/2 TABLETS EVERY DAY  Patient not taking: Reported on 01/25/2021   Copland, Gay Filler, MD Not Taking Active   fluticasone (FLONASE) 50 MCG/ACT nasal spray [id number] Yes Place 2 sprays into both nostrils daily. Charlies Silvers, MD Taking Active            Med Note Delfino Lovett Dec 27, 2020 12:02 PM) Dewaine Conger as needed  furosemide (LASIX) 20 MG tablet [id number] Yes TAKE 1 TABLET BY MOUTH EVERY DAY ON OCCASION AS NEEDED FOR SWELLING Copland, Gay Filler, MD Taking Active   HYDROcodone-acetaminophen (NORCO/VICODIN) 5-325 MG tablet [id number] No Take 1 tablet by mouth every 6 (six) hours as needed.  Patient not taking: Reported on 01/16/2021   [provider] Not Taking Active   Lidocaine 4 % PTCH [id number] Yes Apply 1 patch topically daily. Apply for 12 hours, then removed for 12 hours. [provider] Taking Active   MAGNESIUM GLYCINATE PO [id number] Yes Take 50 mg by mouth daily. [provider] Taking Active   nebivolol (BYSTOLIC) 10 MG tablet [id number] Yes TAKE 1 TABLET(10 MG) BY MOUTH DAILY. Pt needs a office visit for more refills. Copland, Gay Filler, MD Taking Active   omeprazole  (PRILOSEC OTC) 20 MG tablet [id number] Yes Take 20 mg by mouth daily. [provider] Taking Active            Med Note (CANTER, KAYLYN D   Wed Aug 31, 2019  9:03 AM)    Potassium 99 MG TABS [id number] Yes Take 1 tablet by mouth as needed (for leg cramps). [provider] Taking Active  Prednicarbate 0.1 % CREA [id number] Yes as needed. [provider] Taking Active            Med Note (CANTER, KAYLYN D   Wed Aug 31, 2019  9:03 AM)    promethazine (PHENERGAN) 25 MG tablet [id number] Yes Take 1 tablet (25 mg total) by mouth every 6 (six) hours as needed for nausea or vomiting. Orpah Greek, MD Taking Active   sodium chloride (V-R NASAL SPRAY SALINE) 0.65 % nasal spray [id number] Yes Place 1 spray into the nose as needed. Use in both nostrils twice daily Copland, Gay Filler, MD Taking Active   triamcinolone cream (KENALOG) 0.1 % [id number] Yes Apply topically 2 (two) times daily. As needed [provider] Taking Active   valsartan (DIOVAN) 160 MG tablet [id number] Yes TAKE 1 AND 1/2 TABLETS(240 MG) BY MOUTH DAILY Copland, Gay Filler, MD Taking Active             Patient Active Problem List   Diagnosis Date Noted   IDA (iron deficiency anemia) 01/11/2020   Psoriatic arthritis ([place]) 12/07/2019   Psoriasis 12/07/2019   Osteoarthritis 12/07/2019   TIA (transient ischemic attack) 05/18/2019   Restrictive lung disease 05/13/2016   Moderate persistent asthma without complication 58/30/9407   Current use of beta blocker 04/11/2016   Other allergic rhinitis 04/11/2016   Gastroesophageal reflux disease without esophagitis 04/11/2016   Essential hypertension 02/20/2016   Abdominal pain, acute, right upper quadrant 07/29/2013    Immunization History  Administered Date(s) Administered   PFIZER(Purple Top)SARS-COV-2 Vaccination 03/09/2019, 03/30/2019   Pneumococcal Conjugate-13 11/10/2013, 02/20/2016   Pneumococcal Polysaccharide-23 01/24/2019   Td 06/11/2016    Tdap 02/17/2006    Conditions to be addressed/monitored: HTN, HLD, Anxiety, Depression, GERD, Asthma, and history of TIA; restrictive lung disease; psoriatic arthritis; osteoarthritis; chronic pain; anemia;   Care Plan : General Pharmacy (Adult)  Updates made by Cherre Robins, RPH-CPP since 01/28/2021 12:00 AM     Problem: Chronic pain; GERD; iron deficiency anemia; psoriatic arthritis; OA; moderate persistant asthma; HTN; HDL      Long-Range Goal: Provide education, support and care coordination for medication therapy and chronic conditions   Start Date: 01/25/2021  Priority: High  Note:   Current Barriers:  Unable to achieve control of hyperlipidemia    Medication administration timing not optimized to get maximum benefit from PPI  Pharmacist Clinical Goal(s):  Over the next 90 days, patient will achieve control of HDL as evidenced by LDL <100 maintain control of hypertension  as evidenced by blood pressure <140/90  Take PPI prior to a meal  through collaboration with PharmD and provider.   Interventions: 1:1 collaboration with Copland, Gay Filler, MD regarding development and update of comprehensive plan of care as evidenced by provider attestation and co-signature Inter-disciplinary care team collaboration (see longitudinal plan of care) Comprehensive medication review performed; medication list updated in electronic medical record  Hypertension: Controlled; blood pressure goal <140/90 Current treatment: Nebivolol 84m daily  Valsartan 1668m- take 1.5 tablets daily  Current home readings: only checks occasionally; patient did not have any home blood pressure readings to report Denies hypotensive/hypertensive symptoms Interventions:  Educated on on blood pressure goal Recommended continue current regimen for lowering blood pressure   Hyperlipidemia: Uncontrolled; LDL goal <70 Current treatment: none Appears patient likely had a TIA 05/18/2019 however she declined  work up at that time in ER and declined to start clopidogrel (against medical advice). Today she states "I did not have a TIA" Medications  previously tried: none - patient refused treatment for hyperlipidemia  Interventions:  Educated on LDL and other lipid goals Recommended trial of statin therapy - patient declined Continue physical activity 3 times a week (pulmonary rehab) and continue to follow up low fat / Mediterranean type diet.  Recommend recheck lipids with next labs  GERD / Iron Deficiency Anemia: GERD controlled most day; Goal: decrease symptoms of reflux and prevent GI bleed, as well as improve hemoglobin Anemia managed by Dr Martha Clan with Hematology Clinic Current therapy:  Omeprazole 68m tablets - takes 1 tablet daily in the evening after dinner Iron infusion therapy as needed based on hemoglobin Interventions:  Recommended taking omeprazole 30 minutes prior to a meal to get best acid reduction results. Continue to follow up with hematology clinic for anemia (next appointment in April 2023)  Chronic Pain: Controlled; Managed by CMaloneLocation: mostly right hip and leg Current therapy:  Cyclobenzaprine 562mup to twice a day if needed Fioricet - 1 or 2 tablets up to twice a day if needed Lidoderm Pathes over-the-counter 4% - applies for 12 hour if needed Benfotiamine 15044mf needed for neuropathy ("nervous feet") Interventions:  Updated Lidocaine patch on medical list from 5% to 4%. Patient purchases 4% strength over-the-counter and is just as effective as 5% however the 5% patch is prescription and not covered by her insurance so is over $100.  Continue to follow up with pain specialist  Depression / Anxiety Improved per patient Current therapy Fluoxetine 59m26mily (though current directions have a taper up to 1.5 tablets daily) Alprazolam 0.25mg13mto twice a day if needed Patient has been without fluoxetine for 1 day, though she reports that she  has reordered from pharmacy and will pick up today. Fluoxetine has a long half life so 1 or 2 missed doses is unlikely to show a change in symptoms but I did encourage her to restart ASAP.  Last serum sodium was low at 130 (11/06/2020). Fluoxetine can lower sodium however, hyponatremia was present prior to starting fluoxetine.  Appears to be chronic hyponatremia.  Interventions:  Discussed important of taking fluoxetine daily and requesting refill before she runs out.  Recommended follow up with PCP Recommend check CMP, vitamin D and B12 level with next labs  Health Maintenance:  Reviewed vaccination history and discussed benefits of vaccinations Patient has allergy to flu vaccine and has been recommended not to get shingles vaccine by physician in past She has received COVID vaccinations but has not gotten Bivalent vaccine. She is considering. Recommended patient make appointment with PCP - Dr CoplaLorelei Pont declined to allow me to make appointment today. ([facility] [place] - patient declined today  Medication management Pharmacist Clinical Goal(s): Over the next 90 days, patient will work with PharmD and providers to maintain optimal medication adherence Current pharmacy: [facility] Interventions Comprehensive medication review performed. Reviewed refill history and assessed adherence Continue current medication management strategy Patient self care activities - Over the next 90 days, patient will: Focus on medication adherence by filling medications appropriately  Take medications as prescribed Report any questions or concerns to PharmD and/or provider(s)  Patient Goals/Self-Care Activities Over the next 90 days, patient will:  take medications as prescribed,  focus on medication adherence by filling medication on time,  check blood pressure 1 to 2 times per week, document, and provide at future  appointments continue with pulmonary rehab 3 times per week;  Make  appointment to see Dr Lorelei Pont  Follow Up Plan: Patient will continue to follow up with Chronic Care Management nurse management but declined further pharmacy follow up at this time; She has contact information if she has questions regarding medications.       Medication Assistance: None required.  Patient affirms current coverage meets needs.  Patient's preferred pharmacy is:  [facility], [place] [phone] [place] [address] Phone: [phone] Fax: [id number] - [place], [place] [address] Phone: [phone] Fax: [phone]  [facility], [place] [address] [place] [address] Phone: [phone] Fax: [phone]   Follow Up:  Patient requests no follow-up at this time.  Plan:  No pharmacy follow up planned but patient has clinical pharmacists contact information if she has medication related questions or concerns.   Cherre Robins, PharmD Clinical Pharmacist [facility]

## 2021-01-28 NOTE — Patient Instructions (Signed)
Angela Gibbs,  It was a pleasure speaking with you today.  I have attached a summary of our visit today and information about your health goals.  I have not scheduled a follow up appointment per your request, however if you have any questions or concerns, please feel free to contact me either at the phone number below or with a MyChart message.   Cherre Robins, PharmD Clinical Pharmacist [facility] [phone] (direct line)  [phone] (main office number)   Care Plan:  Hypertension: Controlled; blood pressure goal <140/90  BP Readings from Last 3 Encounters:  01/16/21 110/80  12/21/20 140/82  12/11/20 (!) 151/70   Current treatment: Nebivolol 10mg  daily  Valsartan 160mg  - take 1.5 tablets daily  Interventions:  Educated on on blood pressure goal Recommended continue current regimen for lowering blood pressure  Check blood pressure 1 or 2 times per week, record for future appointments  Hyperlipidemia: Uncontrolled; LDL goal <70 Lab Results  Component Value Date   CHOL 194 07/19/2018   CHOL 205 (H) 08/05/2017   CHOL 201 (H) 02/20/2016   Lab Results  Component Value Date   HDL 69.30 07/19/2018   HDL 77.90 08/05/2017   HDL 75.20 02/20/2016   Lab Results  Component Value Date   LDLCALC 110 (H) 07/19/2018   LDLCALC 112 (H) 08/05/2017   LDLCALC 111 (H) 02/20/2016   Lab Results  Component Value Date   TRIG 74.0 07/19/2018   TRIG 72.0 08/05/2017   TRIG 72.0 02/20/2016   Current therpay: none Medications previously tried: none  Interventions:  Educated on LDL and other lipid goals Recommended trial of statin therapy - patient declined Continue physical activity 3 times a week (pulmonary rehab) and continue to follow low fat / Mediterranean type diet.  Recommend recheck lipids with next labs  Acid Reflux / Iron Deficiency Anemia: Current therapy:  Omeprazole 20mg  tablets - take 1 tablet daily in the evening after dinner Iron  infusion therapy as needed based on hemoglobin Interventions:  Recommend take omeprazole 30 minutes prior to a meal to get best acid reduction results. Continue to follow up with hematology clinic for anemia (next appointment in April 2023)  Chronic Pain: Managed by [facility] Current therapy:  Cyclobenzaprine 5mg  up to twice a day if needed Fioricet - 1 or 2 tablets up to twice a day if needed Lidoderm Pathes over-the-counter 4% - applies for 12 hour if needed Benfotiamine 150mg  if needed for neuropathy ("nervous feet") Interventions:  Updated Lidocaine patch on medical list from 5% to 4%.  Continue to follow up with pain specialist  Depression / Anxiety Current therapy Fluoxetine 20mg  daily (though current directions have a taper up to 1.5 tablets daily) Alprazolam 0.25mg  up to twice a day if needed  Interventions:  Discussed important of taking fluoxetine daily and requesting refill before she runs out.  Recommended follow up with Dr Lorelei Pont Recommend check sodium, vitamin D and B12 level with next labs  Health Maintenance:  Reviewed vaccination history and discussed benefits of vaccinations Patient has allergy to flu vaccine and has been recommended not to get shingles vaccine by physician in past She has received COVID vaccinations but has not gotten Bivalent vaccine. She is considering. Recommended patient make appointment with PCP - Dr Lorelei Pont.  Recommended repeat DEXA - bone density (last 2012)   Medication management Pharmacist Clinical Goal(s): Over the next 90 days, patient will work with PharmD and providers to maintain optimal medication adherence Current  pharmacy: [facility] Interventions Comprehensive medication review performed. Reviewed refill history and assessed adherence Continue current medication management strategy  Patient Goals/Self-Care Activities Over the next 90 days, patient will:  take medications as prescribed,  focus on  medication adherence by filling medication on time,  check blood pressure 1 to 2 times per week, document, and provide at future appointments continue with pulmonary rehab 3 times per week;  Make appointment to see Dr Lorelei Pont  Follow Up Plan: Patient will continue to follow up with Chronic Care Management nurse management but declined further pharmacy follow up at this time; She has contact information if she has questions regarding medications.

## 2021-01-31 ENCOUNTER — Encounter: Payer: Self-pay | Admitting: Family Medicine

## 2021-02-01 MED ORDER — FLUOXETINE HCL 20 MG PO TABS: ORAL_TABLET | ORAL | 3 refills | Status: DC

## 2021-02-07 ENCOUNTER — Telehealth: Payer: Self-pay | Admitting: *Deleted

## 2021-02-07 NOTE — Telephone Encounter (Signed)
Approvedtoday [id number]:[id number];Review Type:Prior Auth;Coverage Start Date:01/08/2021;Coverage End Date:02/07/2022;

## 2021-02-07 NOTE — Telephone Encounter (Signed)
Prior auth started via cover my meds.  Awaiting determination.  Key: [id number]

## 2021-02-25 ENCOUNTER — Telehealth: Payer: Self-pay | Admitting: *Deleted

## 2021-02-25 NOTE — Chronic Care Management (AMB) (Signed)
°  Care Management   Note  02/25/2021 Name: Angela Gibbs MRN: [id number] DOB: 17-Aug-1940  [name] is a 81 y.o. year old female who is a primary care patient of Copland, Gay Filler, MD and is actively engaged with the care management team. I reached out to Lesia Sago by phone today to assist with re-scheduling a follow up visit with the RN Case Manager  Follow up plan: Unsuccessful telephone outreach attempt made. A HIPAA compliant phone message was left for the patient providing contact information and requesting a return call.   Julian Hy, [place] Management  Direct Dial: [phone]

## 2021-02-25 NOTE — Progress Notes (Signed)
Opened in error

## 2021-02-27 ENCOUNTER — Ambulatory Visit (INDEPENDENT_AMBULATORY_CARE_PROVIDER_SITE_OTHER): Payer: Medicare Other | Admitting: Family Medicine

## 2021-02-27 VITALS — BP 152/80 | HR 61 | Temp 98.1°F | Resp 20 | Ht 62.5 in | Wt 179.8 lb

## 2021-02-27 DIAGNOSIS — R062 Wheezing: Secondary | ICD-10-CM

## 2021-02-27 DIAGNOSIS — J209 Acute bronchitis, unspecified: Secondary | ICD-10-CM

## 2021-02-27 MED ORDER — PREDNISONE 20 MG PO TABS: ORAL_TABLET | ORAL | 0 refills | Status: DC

## 2021-02-27 MED ORDER — DOXYCYCLINE HYCLATE 100 MG PO CAPS: 100.0000 mg | ORAL_CAPSULE | Freq: Two times a day (BID) | ORAL | 0 refills | Status: DC

## 2021-02-27 NOTE — Progress Notes (Signed)
[facility] [address], [place], [place] [address] [phone]  Date:  02/27/2021   Name:  Angela Gibbs   DOB:  August 02, 1940   MRN:  [id number]  PCP:  Darreld Mclean, MD    Chief Complaint: URI (X 5 days. Denies fever. Wheezing. Husband was sick last week. Unable to sleep.)   History of Present Illness:  Angela Gibbs is a 81 y.o. very pleasant female patient who presents with the following:  Pt here today with concern of illness-think she may have bronchitis She notes a ST, chest congestion and sinus congestion for about 6 days Tested for covid 4x and been negative No fever noted She is coughing up some yellow mucus  She is wheezing some- she is not using albuterol however as it makes her too shaky She did use a steroid inhaler in the past but it did not seem to work that well She does do a sterile water neb sometimes that seems to help   No stomach symptoms Otherwise she is doing okay Herself is in a very serious accident over the holidays, he was riding a bike and was apparently hit by motor vehicle in a hit-and-run accident.  Thankfully he is improving   Patient Active Problem List   Diagnosis Date Noted   IDA (iron deficiency anemia) 01/11/2020   Psoriatic arthritis ([place]) 12/07/2019   Psoriasis 12/07/2019   Osteoarthritis 12/07/2019   TIA (transient ischemic attack) 05/18/2019   Restrictive lung disease 05/13/2016   Moderate persistent asthma without complication 23/53/6144   Current use of beta blocker 04/11/2016   Other allergic rhinitis 04/11/2016   Gastroesophageal reflux disease without esophagitis 04/11/2016   Essential hypertension 02/20/2016   Abdominal pain, acute, right upper quadrant 07/29/2013    Past Medical History:  Diagnosis Date   Allergy    Concussion    Falls    Head injury    Herpes    Hypertension    Migraines    Nerve damage    Shingles     Past Surgical  History:  Procedure Laterality Date   BRAIN SURGERY     CRANIOTOMY FOR EXTRACRANIAL-INTRACRANIAL BYPASS     EYE SURGERY  4.4.17   milia excision   HIP SURGERY     intracranial surgery   NERVE SURGERY     ROOT CANAL     TONSILLECTOMY      Social History   Tobacco Use   Smoking status: Never   Smokeless tobacco: Never  Vaping Use   Vaping Use: Never used  Substance Use Topics   Alcohol use: Yes    Comment: social   Drug use: No    Family History  Problem Relation Age of Onset   Heart disease Father    Heart attack Father     Allergies  Allergen Reactions   Cephalosporins Shortness Of Breath    Breathing difficulty   Ciprofloxacin Hives and Shortness Of Breath   Influenza Virus Vaccine Other (See Comments)    Numbness in legs   Keflex [Cephalexin] Hives   Latex Hives, Itching and Rash   Levaquin [Levofloxacin] Hives, Palpitations and Other (See Comments)    Chest pain   Nitrofuran Derivatives Hives, Itching and Swelling   Penicillins Hives   Sulfacetamide Sodium Hives   Xopenex [Levalbuterol Hcl] Anxiety and Other (See Comments)    Tremors with violent body chattering and shaking   Zostavax [Zoster Vaccine  Live] Other (See Comments)    Pt has been told by specialists not to receive vaccine d/t h/o herpes in bloodstream and shingles.   Erythromycin Other (See Comments)    "stomach issues"   Hydrochlorothiazide Other (See Comments)    Migraine   Lisinopril Cough   Petrolatum Nausea Only   Pregabalin Other (See Comments)   Ropinirole Hcl Nausea And Vomiting   Shellfish Allergy Hives   Sulfa Antibiotics Hives   Aspirin Other (See Comments)    Stomach pain/burning   Avelox [Moxifloxacin Hcl In Nacl] Nausea Only   Cefdinir Nausea And Vomiting   Chromium Other (See Comments)    Blisters   Eggs Or Egg-Derived Products     Per allergy testing, no known reaction. Pt states she was told she could probably eat 2-3 eggs weekly w/o issue.   Iodinated Contrast Media  Other (See Comments)    unknown    Lactose Intolerance (Gi) Other (See Comments)    "stomach issues"   Percodan [Oxycodone-Aspirin] Nausea And Vomiting   Prednisone Palpitations    No injections, can do the pills   Troleandomycin Other (See Comments)    Unknown reaction    Medication list has been reviewed and updated.  Current Outpatient Medications on File Prior to Visit  Medication Sig Dispense Refill   ALPRAZolam (XANAX) 0.25 MG tablet Take 1 tablet (0.25 mg total) by mouth 2 (two) times daily as needed for anxiety. 30 tablet 1   Ascorbic Acid (VITAMIN C) 100 MG tablet Take 1,000 mg by mouth daily. Reported on 05/24/2015     butalbital-acetaminophen-caffeine (FIORICET, ESGIC) 50-325-40 MG per tablet Take 2 tablets by mouth 2 (two) times daily as needed for headache.     Cholecalciferol 125 MCG (5000 UT) TABS daily at 0600.     clindamycin (CLEOCIN) 150 MG capsule 450 mg 3 (three) times daily. 1 hour prior to dental procedures and 6 hours post dental procedure     Cyanocobalamin (B-12 COMPLIANCE INJECTION) 1000 MCG/ML KIT Inject as directed.     cyclobenzaprine (FLEXERIL) 5 MG tablet Take 5 mg by mouth 2 (two) times daily as needed.     diclofenac Sodium (VOLTAREN) 1 % GEL APPLY 2 GRAMS EXTERNALLY TO THE AFFECTED AREA FOUR TIMES DAILY 100 g 0   FLUoxetine (PROZAC) 20 MG tablet Take 1 tablet by mouth daily 90 tablet 3   fluticasone (FLONASE) 50 MCG/ACT nasal spray Place 2 sprays into both nostrils daily. 16 g 5   furosemide (LASIX) 20 MG tablet TAKE 1 TABLET BY MOUTH EVERY DAY ON OCCASION AS NEEDED FOR SWELLING 90 tablet 1   HYDROcodone-acetaminophen (NORCO/VICODIN) 5-325 MG tablet Take 1 tablet by mouth every 6 (six) hours as needed.     Lidocaine 4 % PTCH Apply 1 patch topically daily. Apply for 12 hours, then removed for 12 hours.     MAGNESIUM GLYCINATE PO Take 50 mg by mouth daily.     nebivolol (BYSTOLIC) 10 MG tablet TAKE 1 TABLET(10 MG) BY MOUTH DAILY. Pt needs a office visit  for more refills. 90 tablet 0   omeprazole (PRILOSEC OTC) 20 MG tablet Take 20 mg by mouth daily.     Potassium 99 MG TABS Take 1 tablet by mouth as needed (for leg cramps).     Prednicarbate 0.1 % CREA as needed.     promethazine (PHENERGAN) 25 MG tablet Take 1 tablet (25 mg total) by mouth every 6 (six) hours as needed for nausea or vomiting. 30 tablet  0   sodium chloride (V-R NASAL SPRAY SALINE) 0.65 % nasal spray Place 1 spray into the nose as needed. Use in both nostrils twice daily 30 mL 6   triamcinolone cream (KENALOG) 0.1 % Apply topically 2 (two) times daily. As needed     valsartan (DIOVAN) 160 MG tablet TAKE 1 AND 1/2 TABLETS(240 MG) BY MOUTH DAILY 135 tablet 0   No current facility-administered medications on file prior to visit.    Review of Systems:  As per HPI- otherwise negative.   Physical Examination: Vitals:   02/27/21 1425  BP: (!) 152/80  Pulse: 61  Resp: 20  Temp: 98.1 F (36.7 C)  SpO2: 98%   Vitals:   02/27/21 1425  Weight: 179 lb 12.8 oz (81.6 kg)  Height: 5' 2.5" (1.588 m)   Body mass index is 32.36 kg/m. Ideal Body Weight: Weight in (lb) to have BMI = 25: 138.6  GEN: no acute distress. Obese, looks well  HEENT: Atraumatic, Normocephalic.  Bilateral TM wnl, oropharynx normal.  PEERL,EOMI.   Ears and Nose: No external deformity. CV: RRR, No M/G/R. No JVD. No thrill. No extra heart sounds. PULM: CTA B, no wheezes, crackles, rhonchi. No retractions. No resp. distress. No accessory muscle use. ABD: S, NT, ND, +BS. No rebound. No HSM. EXTR: No c/c/e PSYCH: Normally interactive. Conversant.    Assessment and Plan: Wheezing - Plan: predniSONE (DELTASONE) 20 MG tablet  Acute bronchitis, unspecified organism - Plan: doxycycline (VIBRAMYCIN) 100 MG capsule  Patient seen today with concern of bronchitis.  She has noted coughing for a few days, chest congestion.  History of asthma She is able to tolerate oral prednisone pills.  Prescribed a short  prednisone taper and doxycycline I have asked her to let me know if not feeling better in the next few days, sooner if worse  Signed Lamar Blinks, MD

## 2021-02-27 NOTE — Patient Instructions (Signed)
It was great to see you again today- please let me know if you are not feeling better soon!    Please see me in a few months for a check up

## 2021-03-12 ENCOUNTER — Other Ambulatory Visit: Payer: Self-pay

## 2021-03-22 ENCOUNTER — Other Ambulatory Visit: Payer: Self-pay | Admitting: Family Medicine

## 2021-03-22 DIAGNOSIS — I10 Essential (primary) hypertension: Secondary | ICD-10-CM

## 2021-03-26 NOTE — Chronic Care Management (AMB) (Signed)
°  Care Management   Note  03/26/2021 Name: COLLETTE PESCADOR MRN: [id number] DOB: 04-30-1940  Oswaldo Conroy Knotek is a 81 y.o. year old female who is a primary care patient of Copland, Gay Filler, MD and is actively engaged with the care management team. I reached out to Lesia Sago by phone today to assist with re-scheduling a follow up visit with the RN Case Manager  Follow up plan: 2nd Unsuccessful telephone outreach attempt made. A HIPAA compliant phone message was left for the patient providing contact information and requesting a return call.   Julian Hy, [place] Management  Direct Dial: [phone]

## 2021-04-01 NOTE — Chronic Care Management (AMB) (Signed)
°  Care Management   Note  04/01/2021 Name: [name] MRN: [id number] DOB: 08-15-1940  Angela Gibbs is a 81 y.o. year old female who is a primary care patient of Copland, Gay Filler, MD and is actively engaged with the care management team. I reached out to Lesia Sago by phone today to assist with re-scheduling a follow up visit with the RN Case Manager  Follow up plan: Telephone appointment with care management team member scheduled for: 04/12/2021  Julian Hy, [place], [place] Management  Direct Dial: [phone]

## 2021-04-04 DIAGNOSIS — M62838 Other muscle spasm: Secondary | ICD-10-CM | POA: Diagnosis not present

## 2021-04-04 DIAGNOSIS — H524 Presbyopia: Secondary | ICD-10-CM | POA: Diagnosis not present

## 2021-04-04 DIAGNOSIS — G8929 Other chronic pain: Secondary | ICD-10-CM | POA: Diagnosis not present

## 2021-04-04 DIAGNOSIS — H52221 Regular astigmatism, right eye: Secondary | ICD-10-CM | POA: Diagnosis not present

## 2021-04-04 DIAGNOSIS — B0229 Other postherpetic nervous system involvement: Secondary | ICD-10-CM | POA: Diagnosis not present

## 2021-04-04 DIAGNOSIS — M545 Low back pain, unspecified: Secondary | ICD-10-CM | POA: Diagnosis not present

## 2021-04-04 DIAGNOSIS — G4489 Other headache syndrome: Secondary | ICD-10-CM | POA: Diagnosis not present

## 2021-04-04 DIAGNOSIS — H2513 Age-related nuclear cataract, bilateral: Secondary | ICD-10-CM | POA: Diagnosis not present

## 2021-04-04 DIAGNOSIS — H43813 Vitreous degeneration, bilateral: Secondary | ICD-10-CM | POA: Diagnosis not present

## 2021-04-04 DIAGNOSIS — H5213 Myopia, bilateral: Secondary | ICD-10-CM | POA: Diagnosis not present

## 2021-04-04 DIAGNOSIS — Z5181 Encounter for therapeutic drug level monitoring: Secondary | ICD-10-CM | POA: Diagnosis not present

## 2021-04-04 DIAGNOSIS — H59811 Chorioretinal scars after surgery for detachment, right eye: Secondary | ICD-10-CM | POA: Diagnosis not present

## 2021-04-04 DIAGNOSIS — Z79899 Other long term (current) drug therapy: Secondary | ICD-10-CM | POA: Diagnosis not present

## 2021-04-12 ENCOUNTER — Ambulatory Visit (INDEPENDENT_AMBULATORY_CARE_PROVIDER_SITE_OTHER): Payer: Medicare Other

## 2021-04-12 ENCOUNTER — Ambulatory Visit: Payer: Self-pay | Admitting: Pharmacist

## 2021-04-12 DIAGNOSIS — K219 Gastro-esophageal reflux disease without esophagitis: Secondary | ICD-10-CM

## 2021-04-12 DIAGNOSIS — I10 Essential (primary) hypertension: Secondary | ICD-10-CM

## 2021-04-12 NOTE — Chronic Care Management (AMB) (Signed)
Chronic Care Management   CCM RN Visit Note  04/12/2021 Name: Angela Gibbs MRN: [id number] DOB: 1940-03-14  Subjective: Angela Gibbs is a 81 y.o. year old female who is a primary care patient of Copland, Gay Filler, MD. The care management team was consulted for assistance with disease management and care coordination needs.    Engaged with patient by telephone for follow up visit in response to provider referral for case management and/or care coordination services.   Consent to Services:  The patient was given information about Chronic Care Management services, agreed to services, and gave verbal consent prior to initiation of services.  Please see initial visit note for detailed documentation.   Patient agreed to services and verbal consent obtained.   Assessment: Review of patient past medical history, allergies, medications, health status, including review of consultants reports, laboratory and other test data, was performed as part of comprehensive evaluation and provision of chronic care management services.   SDOH (Social Determinants of Health) assessments and interventions performed:    CCM Care Plan  Allergies  Allergen Reactions   Cephalosporins Shortness Of Breath    Breathing difficulty   Ciprofloxacin Hives and Shortness Of Breath   Influenza Virus Vaccine Other (See Comments)    Numbness in legs   Keflex [Cephalexin] Hives   Latex Hives, Itching and Rash   Levaquin [Levofloxacin] Hives, Palpitations and Other (See Comments)    Chest pain   Nitrofuran Derivatives Hives, Itching and Swelling   Penicillins Hives   Sulfacetamide Sodium Hives   Xopenex [Levalbuterol Hcl] Anxiety and Other (See Comments)    Tremors with violent body chattering and shaking   Zostavax [Zoster Vaccine Live] Other (See Comments)    Pt has been told by specialists not to receive vaccine d/t h/o herpes in bloodstream and shingles.   Erythromycin Other (See Comments)    "stomach  issues"   Hydrochlorothiazide Other (See Comments)    Migraine   Lisinopril Cough   Petrolatum Nausea Only   Pregabalin Other (See Comments)   Ropinirole Hcl Nausea And Vomiting   Shellfish Allergy Hives   Sulfa Antibiotics Hives   Aspirin Other (See Comments)    Stomach pain/burning   Avelox [Moxifloxacin Hcl In Nacl] Nausea Only   Cefdinir Nausea And Vomiting   Chromium Other (See Comments)    Blisters   Eggs Or Egg-Derived Products     Per allergy testing, no known reaction. Pt states she was told she could probably eat 2-3 eggs weekly w/o issue.   Iodinated Contrast Media Other (See Comments)    unknown    Lactose Intolerance (Gi) Other (See Comments)    "stomach issues"   Percodan [Oxycodone-Aspirin] Nausea And Vomiting   Prednisone Palpitations    No injections, can do the pills   Troleandomycin Other (See Comments)    Unknown reaction    Outpatient Encounter Medications as of 04/12/2021  Medication Sig Note   ALPRAZolam (XANAX) 0.25 MG tablet Take 1 tablet (0.25 mg total) by mouth 2 (two) times daily as needed for anxiety.    Ascorbic Acid (VITAMIN C) 100 MG tablet Take 1,000 mg by mouth daily. Reported on 05/24/2015    butalbital-acetaminophen-caffeine (FIORICET, ESGIC) 50-325-40 MG per tablet Take 2 tablets by mouth 2 (two) times daily as needed for headache.    Cholecalciferol 125 MCG (5000 UT) TABS daily at 0600.    clindamycin (CLEOCIN) 150 MG capsule 450 mg 3 (three) times daily. 1 hour prior to dental procedures  and 6 hours post dental procedure    Cyanocobalamin (B-12 COMPLIANCE INJECTION) 1000 MCG/ML KIT Inject as directed.    cyclobenzaprine (FLEXERIL) 5 MG tablet Take 5 mg by mouth 2 (two) times daily as needed.    diclofenac Sodium (VOLTAREN) 1 % GEL APPLY 2 GRAMS EXTERNALLY TO THE AFFECTED AREA FOUR TIMES DAILY    doxycycline (VIBRAMYCIN) 100 MG capsule Take 1 capsule (100 mg total) by mouth 2 (two) times daily.    FLUoxetine (PROZAC) 20 MG tablet Take 1  tablet by mouth daily    fluticasone (FLONASE) 50 MCG/ACT nasal spray Place 2 sprays into both nostrils daily. 12/27/2020: Takes as needed   furosemide (LASIX) 20 MG tablet TAKE 1 TABLET BY MOUTH EVERY DAY ON OCCASION AS NEEDED FOR SWELLING    HYDROcodone-acetaminophen (NORCO/VICODIN) 5-325 MG tablet Take 1 tablet by mouth every 6 (six) hours as needed.    Lidocaine 4 % PTCH Apply 1 patch topically daily. Apply for 12 hours, then removed for 12 hours.    MAGNESIUM GLYCINATE PO Take 50 mg by mouth daily.    nebivolol (BYSTOLIC) 10 MG tablet TAKE 1 TABLET(10 MG) BY MOUTH DAILY    omeprazole (PRILOSEC OTC) 20 MG tablet Take 20 mg by mouth daily.    Potassium 99 MG TABS Take 1 tablet by mouth as needed (for leg cramps).    Prednicarbate 0.1 % CREA as needed.    predniSONE (DELTASONE) 20 MG tablet Take 40 mg daily for 3 days then 20 mg daily for 3 days    promethazine (PHENERGAN) 25 MG tablet Take 1 tablet (25 mg total) by mouth every 6 (six) hours as needed for nausea or vomiting.    sodium chloride (V-R NASAL SPRAY SALINE) 0.65 % nasal spray Place 1 spray into the nose as needed. Use in both nostrils twice daily    triamcinolone cream (KENALOG) 0.1 % Apply topically 2 (two) times daily. As needed    valsartan (DIOVAN) 160 MG tablet TAKE 1 AND 1/2 TABLETS(240 MG) BY MOUTH DAILY    No facility-administered encounter medications on file as of 04/12/2021.    Patient Active Problem List   Diagnosis Date Noted   IDA (iron deficiency anemia) 01/11/2020   Psoriatic arthritis ([place]) 12/07/2019   Psoriasis 12/07/2019   Osteoarthritis 12/07/2019   TIA (transient ischemic attack) 05/18/2019   Restrictive lung disease 05/13/2016   Moderate persistent asthma without complication 76/73/4193   Current use of beta blocker 04/11/2016   Other allergic rhinitis 04/11/2016   Gastroesophageal reflux disease without esophagitis 04/11/2016   Essential hypertension 02/20/2016   Abdominal pain, acute, right upper  quadrant 07/29/2013    Conditions to be addressed/monitored:HTN and GERD  Care Plan : RN Care Manager Plan of Care  Updates made by Luretha Rued, RN since 04/12/2021 12:00 AM  Completed 04/12/2021   Problem: Chronic disease Managment education and/or care coordination needs (HTN,GERD, Chronic Pain) Resolved 04/12/2021  Priority: High     Long-Range Goal: Development of plan of care for Chronic Disease Management (HTN, GERD, Chronic Pain) Completed 04/12/2021  Start Date: 12/27/2020  Expected End Date: 04/26/2021  Priority: High  Note:   Current Barriers: Ms. Beaupre reports she is doing well. Last office visit on 02/27/21 treated for bronchitis. Ms. Mcclatchey reports she is much better. She reports she has all her medications; declines to go over medication during telephone assessment. She denies any questions/concerns about her medications. She reports she continues to attend pulmonary rehab three days a week  and states her blood pressure readings are always checked at that time. She reports she is very active and has supportive sister who is a nurse who helps her stay on top of her health. She does not want to participate in the CCM program anymore, including pharmacist services. She is with no questions or concerns.    Care Coordination needs related to medication reconciliation Providers not listed in [place] Chronic Disease Management support and education needs related to HTN and GERD Polypharmacy- Patient states she is also taking B12 injections as needed and Benfotiamin. RNCM Clinical Goal(s):  Patient will verbalize understanding of plan for management of HTN, GERD, and Chronic low back pain take all medications exactly as prescribed and will call provider for medication related questions work with pharmacist to address medication management related toHTN, GERD, and Chronic low back pain  through collaboration with RN Care  manager, provider, and care team.   Interventions: 1:1 collaboration with primary care provider regarding development and update of comprehensive plan of care as evidenced by provider attestation and co-signature Inter-disciplinary care team collaboration (see longitudinal plan of care) Evaluation of current treatment plan related to  self management and patient's adherence to plan as established by provider Pharmacy referral for medication reconciliation.  Hypertension Interventions: Long Term: Goal on track: Yes She reports blood pressure checked 3 times/week at pulmonary rehab Last practice recorded BP readings:  BP Readings from Last 3 Encounters:  02/27/21 (!) 152/80  01/16/21 110/80  12/21/20 140/82  Most recent eGFR/CrCl: No results found for: EGFR  No components found for: CRCL  Encouraged to continue to attend provider visit as scheduled/recommended Encouraged to continue to keep track of blood pressure  GERD Interventions: Long Term: Goal on track:  Yes.  Advised patient to continue to take medications as prescribed; Encouraged patient to attend provider visits as scheduled.  Chronic Low Back Pain Interventions: Long Term: Goal on track: Yes. Reviewed provider established plan for pain management; Discussed importance of adherence to all scheduled medical appointments;   Patient Goals/Self-Care Activities: Patient will self administer medications as prescribed Patient will attend all scheduled provider appointments Patient will call provider office for new concerns or questions Review education provided on hypertension. Plan to receive a call from clinical pharmacist for medication review   Plan: Ms. Risinger expresses, she does not want to continue participation in CCM services. RNCM will update clinical pharmacist. No further follow up required.    Thea Silversmith, RN, MSN, BSN, CCM Care Management Coordinator [facility] [phone]

## 2021-04-12 NOTE — Patient Instructions (Signed)
Visit Information  Thank you for allowing me to share the care management and care coordination services that are available to you as part of your health plan and services through your primary care provider and medical home. Please reach out to me at [phone] if the care management/care coordination team may be of assistance to you in the future.   Keltin Baird, RN, MSN, BSN, CCM Care Management Coordinator [facility] [phone]  

## 2021-04-12 NOTE — Chronic Care Management (AMB) (Signed)
° °  04/12/2021  Angela Gibbs 1940/03/15 [id number]  Pre previous Chronic Care Management note from [place]. No further follow up needed with [name] regarding medication.  Completed goals and removed CLINICAL PHARMACIST PRACTITIONER from care team list.   Cherre Robins, PharmD Clinical Pharmacist [facility]

## 2021-04-30 ENCOUNTER — Other Ambulatory Visit: Payer: Self-pay | Admitting: Family Medicine

## 2021-04-30 ENCOUNTER — Telehealth: Payer: Self-pay

## 2021-04-30 DIAGNOSIS — I10 Essential (primary) hypertension: Secondary | ICD-10-CM

## 2021-04-30 DIAGNOSIS — D500 Iron deficiency anemia secondary to blood loss (chronic): Secondary | ICD-10-CM

## 2021-04-30 NOTE — Telephone Encounter (Signed)
Pt called inquiring if she can have her labs checked to see if she needs an iron infusion as she has been SOB and tired. Called pt back and asked if she could come in tomorrow morning for labs. She said she can come tomorrow afternoon. Schedling message sent and orders placed. ?

## 2021-05-01 ENCOUNTER — Inpatient Hospital Stay: Payer: Medicare Other | Attending: Hematology & Oncology

## 2021-05-01 ENCOUNTER — Other Ambulatory Visit: Payer: Self-pay

## 2021-05-01 DIAGNOSIS — D509 Iron deficiency anemia, unspecified: Secondary | ICD-10-CM | POA: Diagnosis not present

## 2021-05-01 DIAGNOSIS — D500 Iron deficiency anemia secondary to blood loss (chronic): Secondary | ICD-10-CM

## 2021-05-01 DIAGNOSIS — Z79899 Other long term (current) drug therapy: Secondary | ICD-10-CM | POA: Insufficient documentation

## 2021-05-01 LAB — CBC WITH DIFFERENTIAL (CANCER CENTER ONLY)
Abs Immature Granulocytes: 0.01 10*3/uL (ref 0.00–0.07)
Basophils Absolute: 0 10*3/uL (ref 0.0–0.1)
Basophils Relative: 1 %
Eosinophils Absolute: 0.4 10*3/uL (ref 0.0–0.5)
Eosinophils Relative: 7 %
HCT: 37.3 % (ref 36.0–46.0)
Hemoglobin: 12.6 g/dL (ref 12.0–15.0)
Immature Granulocytes: 0 %
Lymphocytes Relative: 18 %
Lymphs Abs: 1 10*3/uL (ref 0.7–4.0)
MCH: 31 pg (ref 26.0–34.0)
MCHC: 33.8 g/dL (ref 30.0–36.0)
MCV: 91.9 fL (ref 80.0–100.0)
Monocytes Absolute: 0.6 10*3/uL (ref 0.1–1.0)
Monocytes Relative: 11 %
Neutro Abs: 3.4 10*3/uL (ref 1.7–7.7)
Neutrophils Relative %: 63 %
Platelet Count: 220 10*3/uL (ref 150–400)
RBC: 4.06 MIL/uL (ref 3.87–5.11)
RDW: 11.9 % (ref 11.5–15.5)
WBC Count: 5.5 10*3/uL (ref 4.0–10.5)
nRBC: 0 % (ref 0.0–0.2)

## 2021-05-01 LAB — CMP (CANCER CENTER ONLY)
ALT: 16 U/L (ref 0–44)
AST: 18 U/L (ref 15–41)
Albumin: 4 g/dL (ref 3.5–5.0)
Alkaline Phosphatase: 78 U/L (ref 38–126)
Anion gap: 6 (ref 5–15)
BUN: 18 mg/dL (ref 8–23)
CO2: 28 mmol/L (ref 22–32)
Calcium: 9.4 mg/dL (ref 8.9–10.3)
Chloride: 97 mmol/L — ABNORMAL LOW (ref 98–111)
Creatinine: 0.7 mg/dL (ref 0.44–1.00)
GFR, Estimated: >60 mL/min (ref >60)
Glucose, Bld: 88 mg/dL (ref 70–99)
Potassium: 4.5 mmol/L (ref 3.5–5.1)
Sodium: 131 mmol/L — ABNORMAL LOW (ref 135–145)
Total Bilirubin: 0.4 mg/dL (ref 0.3–1.2)
Total Protein: 6.3 g/dL — ABNORMAL LOW (ref 6.5–8.1)

## 2021-05-02 LAB — IRON AND IRON BINDING CAPACITY (CC-WL,HP ONLY)
Iron: 53 ug/dL (ref 28–170)
Saturation Ratios: 14 % (ref 10.4–31.8)
TIBC: 393 ug/dL (ref 250–450)
UIBC: 340 ug/dL (ref 148–442)

## 2021-05-02 LAB — FERRITIN: Ferritin: 56 ng/mL (ref 11–307)

## 2021-05-08 ENCOUNTER — Inpatient Hospital Stay: Payer: Medicare Other

## 2021-05-08 ENCOUNTER — Other Ambulatory Visit: Payer: Self-pay

## 2021-05-08 VITALS — BP 172/60 | HR 56 | Temp 98.0°F | Resp 18

## 2021-05-08 DIAGNOSIS — D509 Iron deficiency anemia, unspecified: Secondary | ICD-10-CM | POA: Diagnosis not present

## 2021-05-08 DIAGNOSIS — D500 Iron deficiency anemia secondary to blood loss (chronic): Secondary | ICD-10-CM

## 2021-05-08 DIAGNOSIS — Z79899 Other long term (current) drug therapy: Secondary | ICD-10-CM | POA: Diagnosis not present

## 2021-05-08 MED ORDER — SODIUM CHLORIDE 0.9 % IV SOLN: Freq: Once | INTRAVENOUS | Status: AC

## 2021-05-08 MED ORDER — SODIUM CHLORIDE 0.9 % IV SOLN
510.0000 mg | Freq: Once | INTRAVENOUS | Status: AC
  Administered 2021-05-08: 510 mg via INTRAVENOUS
  Filled 2021-05-08: qty 510

## 2021-05-08 NOTE — Patient Instructions (Signed)

## 2021-05-15 ENCOUNTER — Inpatient Hospital Stay: Payer: Medicare Other

## 2021-05-15 VITALS — BP 154/70 | HR 51 | Temp 98.2°F | Resp 20

## 2021-05-15 DIAGNOSIS — D509 Iron deficiency anemia, unspecified: Secondary | ICD-10-CM | POA: Diagnosis not present

## 2021-05-15 DIAGNOSIS — Z79899 Other long term (current) drug therapy: Secondary | ICD-10-CM | POA: Diagnosis not present

## 2021-05-15 DIAGNOSIS — D500 Iron deficiency anemia secondary to blood loss (chronic): Secondary | ICD-10-CM

## 2021-05-15 MED ORDER — SODIUM CHLORIDE 0.9 % IV SOLN
510.0000 mg | Freq: Once | INTRAVENOUS | Status: AC
  Administered 2021-05-15: 510 mg via INTRAVENOUS
  Filled 2021-05-15: qty 510

## 2021-05-15 MED ORDER — SODIUM CHLORIDE 0.9 % IV SOLN: Freq: Once | INTRAVENOUS | Status: AC

## 2021-05-15 NOTE — Patient Instructions (Signed)

## 2021-06-11 ENCOUNTER — Inpatient Hospital Stay: Payer: Medicare Other | Attending: Hematology & Oncology

## 2021-06-11 ENCOUNTER — Encounter: Payer: Self-pay | Admitting: Family

## 2021-06-11 ENCOUNTER — Inpatient Hospital Stay (HOSPITAL_BASED_OUTPATIENT_CLINIC_OR_DEPARTMENT_OTHER): Payer: Medicare Other | Admitting: Family

## 2021-06-11 VITALS — BP 157/55 | HR 62 | Temp 98.3°F | Resp 18 | Wt 180.1 lb

## 2021-06-11 DIAGNOSIS — D500 Iron deficiency anemia secondary to blood loss (chronic): Secondary | ICD-10-CM

## 2021-06-11 DIAGNOSIS — Z881 Allergy status to other antibiotic agents status: Secondary | ICD-10-CM | POA: Diagnosis not present

## 2021-06-11 DIAGNOSIS — Z886 Allergy status to analgesic agent status: Secondary | ICD-10-CM | POA: Diagnosis not present

## 2021-06-11 DIAGNOSIS — Z882 Allergy status to sulfonamides status: Secondary | ICD-10-CM | POA: Insufficient documentation

## 2021-06-11 DIAGNOSIS — Z887 Allergy status to serum and vaccine status: Secondary | ICD-10-CM | POA: Diagnosis not present

## 2021-06-11 DIAGNOSIS — Z880 Allergy status to penicillin: Secondary | ICD-10-CM | POA: Insufficient documentation

## 2021-06-11 DIAGNOSIS — Z79899 Other long term (current) drug therapy: Secondary | ICD-10-CM | POA: Diagnosis not present

## 2021-06-11 DIAGNOSIS — D509 Iron deficiency anemia, unspecified: Secondary | ICD-10-CM | POA: Insufficient documentation

## 2021-06-11 DIAGNOSIS — Z888 Allergy status to other drugs, medicaments and biological substances status: Secondary | ICD-10-CM | POA: Insufficient documentation

## 2021-06-11 DIAGNOSIS — Z885 Allergy status to narcotic agent status: Secondary | ICD-10-CM | POA: Diagnosis not present

## 2021-06-11 LAB — CBC WITH DIFFERENTIAL (CANCER CENTER ONLY)
Abs Immature Granulocytes: 0.07 10*3/uL (ref 0.00–0.07)
Basophils Absolute: 0 10*3/uL (ref 0.0–0.1)
Basophils Relative: 1 %
Eosinophils Absolute: 0.4 10*3/uL (ref 0.0–0.5)
Eosinophils Relative: 7 %
HCT: 36.5 % (ref 36.0–46.0)
Hemoglobin: 12.6 g/dL (ref 12.0–15.0)
Immature Granulocytes: 1 %
Lymphocytes Relative: 16 %
Lymphs Abs: 0.9 10*3/uL (ref 0.7–4.0)
MCH: 31.7 pg (ref 26.0–34.0)
MCHC: 34.5 g/dL (ref 30.0–36.0)
MCV: 91.7 fL (ref 80.0–100.0)
Monocytes Absolute: 0.7 10*3/uL (ref 0.1–1.0)
Monocytes Relative: 13 %
Neutro Abs: 3.4 10*3/uL (ref 1.7–7.7)
Neutrophils Relative %: 62 %
Platelet Count: 214 10*3/uL (ref 150–400)
RBC: 3.98 MIL/uL (ref 3.87–5.11)
RDW: 12.8 % (ref 11.5–15.5)
WBC Count: 5.4 10*3/uL (ref 4.0–10.5)
nRBC: 0 % (ref 0.0–0.2)

## 2021-06-11 LAB — RETICULOCYTES
Immature Retic Fract: 4.8 % (ref 2.3–15.9)
RBC.: 3.96 MIL/uL (ref 3.87–5.11)
Retic Count, Absolute: 97.8 10*3/uL (ref 19.0–186.0)
Retic Ct Pct: 2.5 % (ref 0.4–3.1)

## 2021-06-11 LAB — IRON AND IRON BINDING CAPACITY (CC-WL,HP ONLY)
Iron: 127 ug/dL (ref 28–170)
Saturation Ratios: 39 % — ABNORMAL HIGH (ref 10.4–31.8)
TIBC: 322 ug/dL (ref 250–450)
UIBC: 195 ug/dL (ref 148–442)

## 2021-06-11 LAB — FERRITIN: Ferritin: 581 ng/mL — ABNORMAL HIGH (ref 11–307)

## 2021-06-11 NOTE — Progress Notes (Signed)
?Hematology and Oncology Follow Up Visit ? ?VIRGINIE JOSTEN ?[id number] ?1940-11-08 81 y.o. ?06/11/2021 ? ? ?Principle Diagnosis:  ?Iron deficiency anemia ?  ?Current Therapy:        ?IV iron as indicated  ?  ?Interim History:  Ms. Cottingham is here today for follow-up. She is doing well and has no complaints at this time.  ?No blood loss noted. No bruising or petechiae.  ?No fever, chills, n/v, cough, rash, dizziness, SOB, chest pain, palpitations, abdominal pain or changes in bowel or bladder habits.  ?No swelling, tenderness, numbness or tingling in her extremities at this time.  ?No falls or syncope reported.  ?She has maintained a good appetite and is staying well hydrated. Her weight is stable at 180 lbs.  ? ?ECOG Performance Status: 0 - Asymptomatic ? ?Medications:  ?Allergies as of 06/11/2021   ? ?   Reactions  ? Cephalosporins Shortness Of Breath  ? Breathing difficulty  ? Ciprofloxacin Hives, Shortness Of Breath  ? Influenza Virus Vaccine Other (See Comments)  ? Numbness in legs  ? Keflex [cephalexin] Hives  ? Latex Hives, Itching, Rash  ? Levaquin [levofloxacin] Hives, Palpitations, Other (See Comments)  ? Chest pain  ? Nitrofuran Derivatives Hives, Itching, Swelling  ? Penicillins Hives  ? Sulfacetamide Sodium Hives  ? Xopenex [levalbuterol Hcl] Anxiety, Other (See Comments)  ? Tremors with violent body chattering and shaking  ? Zostavax [zoster Vaccine Live] Other (See Comments)  ? Pt has been told by specialists not to receive vaccine d/t h/o herpes in bloodstream and shingles.  ? Erythromycin Other (See Comments)  ? "stomach issues"  ? Hydrochlorothiazide Other (See Comments)  ? Migraine  ? Lisinopril Cough  ? Petrolatum Nausea Only  ? Pregabalin Other (See Comments)  ? Ropinirole Hcl Nausea And Vomiting  ? Shellfish Allergy Hives  ? Sulfa Antibiotics Hives  ? Aspirin Other (See Comments)  ? Stomach pain/burning  ? Avelox [moxifloxacin Hcl In Nacl] Nausea Only  ? Cefdinir Nausea And Vomiting  ? Chromium  Other (See Comments)  ? Blisters  ? Eggs Or Egg-derived Products   ? Per allergy testing, no known reaction. Pt states she was told she could probably eat 2-3 eggs weekly w/o issue.  ? Iodinated Contrast Media Other (See Comments)  ? unknown  ? Lactose Intolerance (gi) Other (See Comments)  ? "stomach issues"  ? Percodan [oxycodone-aspirin] Nausea And Vomiting  ? Prednisone Palpitations  ? No injections, can do the pills  ? Troleandomycin Other (See Comments)  ? Unknown reaction  ? ?  ? ?  ?Medication List  ?  ? ?  ? Accurate as of June 11, 2021  1:21 PM. If you have any questions, ask your nurse or doctor.  ?  ?  ? ?  ? ?ALPRAZolam 0.25 MG tablet ?Commonly known as: Duanne Moron ?Take 1 tablet (0.25 mg total) by mouth 2 (two) times daily as needed for anxiety. ?  ?B-12 Compliance Injection 1000 MCG/ML Kit ?Generic drug: Cyanocobalamin ?Inject as directed. ?  ?butalbital-acetaminophen-caffeine 50-325-40 MG tablet ?Commonly known as: FIORICET ?Take 2 tablets by mouth 2 (two) times daily as needed for headache. ?  ?Cholecalciferol 125 MCG (5000 UT) Tabs ?daily at 0600. ?  ?clindamycin 150 MG capsule ?Commonly known as: CLEOCIN ?450 mg 3 (three) times daily. 1 hour prior to dental procedures and 6 hours post dental procedure ?  ?cyclobenzaprine 5 MG tablet ?Commonly known as: FLEXERIL ?Take 5 mg by mouth 2 (two) times daily as needed. ?  ?  diclofenac Sodium 1 % Gel ?Commonly known as: VOLTAREN ?APPLY 2 GRAMS EXTERNALLY TO THE AFFECTED AREA FOUR TIMES DAILY ?  ?doxycycline 100 MG capsule ?Commonly known as: VIBRAMYCIN ?Take 1 capsule (100 mg total) by mouth 2 (two) times daily. ?  ?FLUoxetine 20 MG tablet ?Commonly known as: PROZAC ?Take 1 tablet by mouth daily ?  ?fluticasone 50 MCG/ACT nasal spray ?Commonly known as: FLONASE ?Place 2 sprays into both nostrils daily. ?  ?furosemide 20 MG tablet ?Commonly known as: LASIX ?TAKE 1 TABLET BY MOUTH EVERY DAY ON OCCASION AS NEEDED FOR SWELLING ?  ?HYDROcodone-acetaminophen 5-325 MG  tablet ?Commonly known as: NORCO/VICODIN ?Take 1 tablet by mouth every 6 (six) hours as needed. ?  ?lidocaine 4 % ?Apply 1 patch topically daily. Apply for 12 hours, then removed for 12 hours. ?  ?MAGNESIUM GLYCINATE PO ?Take 50 mg by mouth daily. ?  ?nebivolol 10 MG tablet ?Commonly known as: BYSTOLIC ?TAKE 1 TABLET(10 MG) BY MOUTH DAILY ?  ?omeprazole 20 MG tablet ?Commonly known as: PRILOSEC OTC ?Take 20 mg by mouth daily. ?  ?Potassium 99 MG Tabs ?Take 1 tablet by mouth as needed (for leg cramps). ?  ?Prednicarbate 0.1 % Crea ?as needed. ?  ?predniSONE 20 MG tablet ?Commonly known as: DELTASONE ?Take 40 mg daily for 3 days then 20 mg daily for 3 days ?  ?promethazine 25 MG tablet ?Commonly known as: PHENERGAN ?Take 1 tablet (25 mg total) by mouth every 6 (six) hours as needed for nausea or vomiting. ?  ?sodium chloride 0.65 % nasal spray ?Commonly known as: V-R NASAL SPRAY SALINE ?Place 1 spray into the nose as needed. Use in both nostrils twice daily ?  ?triamcinolone cream 0.1 % ?Commonly known as: KENALOG ?Apply topically 2 (two) times daily. As needed ?  ?valsartan 160 MG tablet ?Commonly known as: DIOVAN ?TAKE 1 AND 1/2 TABLETS(240 MG) BY MOUTH DAILY ?  ?vitamin C 100 MG tablet ?Take 1,000 mg by mouth daily. Reported on 05/24/2015 ?  ? ?  ? ? ?Allergies:  ?Allergies  ?Allergen Reactions  ? Cephalosporins Shortness Of Breath  ?  Breathing difficulty  ? Ciprofloxacin Hives and Shortness Of Breath  ? Influenza Virus Vaccine Other (See Comments)  ?  Numbness in legs  ? Keflex [Cephalexin] Hives  ? Latex Hives, Itching and Rash  ? Levaquin [Levofloxacin] Hives, Palpitations and Other (See Comments)  ?  Chest pain  ? Nitrofuran Derivatives Hives, Itching and Swelling  ? Penicillins Hives  ? Sulfacetamide Sodium Hives  ? Xopenex [Levalbuterol Hcl] Anxiety and Other (See Comments)  ?  Tremors with violent body chattering and shaking  ? Zostavax [Zoster Vaccine Live] Other (See Comments)  ?  Pt has been told by  specialists not to receive vaccine d/t h/o herpes in bloodstream and shingles.  ? Erythromycin Other (See Comments)  ?  "stomach issues"  ? Hydrochlorothiazide Other (See Comments)  ?  Migraine  ? Lisinopril Cough  ? Petrolatum Nausea Only  ? Pregabalin Other (See Comments)  ? Ropinirole Hcl Nausea And Vomiting  ? Shellfish Allergy Hives  ? Sulfa Antibiotics Hives  ? Aspirin Other (See Comments)  ?  Stomach pain/burning  ? Avelox [Moxifloxacin Hcl In Nacl] Nausea Only  ? Cefdinir Nausea And Vomiting  ? Chromium Other (See Comments)  ?  Blisters  ? Eggs Or Egg-Derived Products   ?  Per allergy testing, no known reaction. Pt states she was told she could probably eat 2-3 eggs weekly  w/o issue.  ? Iodinated Contrast Media Other (See Comments)  ?  unknown ?  ? Lactose Intolerance (Gi) Other (See Comments)  ?  "stomach issues"  ? Percodan [Oxycodone-Aspirin] Nausea And Vomiting  ? Prednisone Palpitations  ?  No injections, can do the pills  ? Troleandomycin Other (See Comments)  ?  Unknown reaction  ? ? ?Past Medical History, Surgical history, Social history, and Family History were reviewed and updated. ? ?Review of Systems: ?All other 10 point review of systems is negative.  ? ?Physical Exam: ? weight is 180 lb 1.9 oz (81.7 kg). Her oral temperature is 98.3 ?F (36.8 ?C). Her blood pressure is 157/55 (abnormal) and her pulse is 62. Her respiration is 18 and oxygen saturation is 99%.  ? ?Wt Readings from Last 3 Encounters:  ?06/11/21 180 lb 1.9 oz (81.7 kg)  ?02/27/21 179 lb 12.8 oz (81.6 kg)  ?01/16/21 179 lb (81.2 kg)  ? ? ?Ocular: Sclerae unicteric, pupils equal, round and reactive to light ?Ear-nose-throat: Oropharynx clear, dentition fair ?Lymphatic: No cervical or supraclavicular adenopathy ?Lungs no rales or rhonchi, good excursion bilaterally ?Heart regular rate and rhythm, no murmur appreciated ?Abd soft, nontender, positive bowel sounds ?MSK no focal spinal tenderness, no joint edema ?Neuro: non-focal,  well-oriented, appropriate affect ?Breasts: Deferred  ? ?Lab Results  ?Component Value Date  ? WBC 5.4 06/11/2021  ? HGB 12.6 06/11/2021  ? HCT 36.5 06/11/2021  ? MCV 91.7 06/11/2021  ? PLT 214 06/11/2021  ? ?Lab Res

## 2021-06-20 ENCOUNTER — Telehealth: Payer: Self-pay | Admitting: *Deleted

## 2021-06-20 ENCOUNTER — Other Ambulatory Visit: Payer: Self-pay | Admitting: Family Medicine

## 2021-06-20 DIAGNOSIS — I10 Essential (primary) hypertension: Secondary | ICD-10-CM

## 2021-06-20 NOTE — Telephone Encounter (Signed)
Per 06/12/21 los - gave upcoming appointments - confirmed ?

## 2021-07-29 ENCOUNTER — Other Ambulatory Visit: Payer: Self-pay | Admitting: Family Medicine

## 2021-07-29 DIAGNOSIS — I10 Essential (primary) hypertension: Secondary | ICD-10-CM

## 2021-08-28 ENCOUNTER — Other Ambulatory Visit: Payer: Self-pay | Admitting: Family Medicine

## 2021-08-28 DIAGNOSIS — I10 Essential (primary) hypertension: Secondary | ICD-10-CM

## 2021-09-10 ENCOUNTER — Inpatient Hospital Stay: Payer: Medicare Other | Attending: Hematology & Oncology

## 2021-09-10 DIAGNOSIS — D500 Iron deficiency anemia secondary to blood loss (chronic): Secondary | ICD-10-CM

## 2021-09-10 DIAGNOSIS — R350 Frequency of micturition: Secondary | ICD-10-CM | POA: Diagnosis not present

## 2021-09-10 DIAGNOSIS — D509 Iron deficiency anemia, unspecified: Secondary | ICD-10-CM | POA: Diagnosis not present

## 2021-09-10 DIAGNOSIS — Z01411 Encounter for gynecological examination (general) (routine) with abnormal findings: Secondary | ICD-10-CM | POA: Diagnosis not present

## 2021-09-10 LAB — CBC WITH DIFFERENTIAL (CANCER CENTER ONLY)
Abs Immature Granulocytes: 0.07 10*3/uL (ref 0.00–0.07)
Basophils Absolute: 0.1 10*3/uL (ref 0.0–0.1)
Basophils Relative: 1 %
Eosinophils Absolute: 0.2 10*3/uL (ref 0.0–0.5)
Eosinophils Relative: 5 %
HCT: 36 % (ref 36.0–46.0)
Hemoglobin: 12.2 g/dL (ref 12.0–15.0)
Immature Granulocytes: 1 %
Lymphocytes Relative: 16 %
Lymphs Abs: 0.8 10*3/uL (ref 0.7–4.0)
MCH: 31.4 pg (ref 26.0–34.0)
MCHC: 33.9 g/dL (ref 30.0–36.0)
MCV: 92.8 fL (ref 80.0–100.0)
Monocytes Absolute: 0.6 10*3/uL (ref 0.1–1.0)
Monocytes Relative: 12 %
Neutro Abs: 3.2 10*3/uL (ref 1.7–7.7)
Neutrophils Relative %: 65 %
Platelet Count: 203 10*3/uL (ref 150–400)
RBC: 3.88 MIL/uL (ref 3.87–5.11)
RDW: 11.5 % (ref 11.5–15.5)
WBC Count: 5 10*3/uL (ref 4.0–10.5)
nRBC: 0 % (ref 0.0–0.2)

## 2021-09-10 LAB — RETICULOCYTES
Immature Retic Fract: 6.3 % (ref 2.3–15.9)
RBC.: 3.88 MIL/uL (ref 3.87–5.11)
Retic Count, Absolute: 70.6 10*3/uL (ref 19.0–186.0)
Retic Ct Pct: 1.8 % (ref 0.4–3.1)

## 2021-09-10 LAB — FERRITIN: Ferritin: 63 ng/mL (ref 11–307)

## 2021-09-10 LAB — IRON AND IRON BINDING CAPACITY (CC-WL,HP ONLY)
Iron: 62 ug/dL (ref 28–170)
Saturation Ratios: 18 % (ref 10.4–31.8)
TIBC: 354 ug/dL (ref 250–450)
UIBC: 292 ug/dL (ref 148–442)

## 2021-09-11 DIAGNOSIS — R350 Frequency of micturition: Secondary | ICD-10-CM | POA: Diagnosis not present

## 2021-09-13 ENCOUNTER — Telehealth: Payer: Self-pay

## 2021-09-13 NOTE — Telephone Encounter (Signed)
Caller Name Audryna Wendt Phone Number [phone] Patient Name Angela Gibbs Patient DOB [birth year] Call Type Message Only Information Provided Reason for Call Request to Schedule Office Appointment Initial Comment Caller states she wants to schedule an appointment. Patient request to speak to RN No Disp. Time Disposition Final User 09/12/2021 6:39:40 PM General Information Provided Yes Janice Norrie Call Closed By: Janice Norrie Transaction Date/Time: 09/12/2021 6:37:21 PM (ET)

## 2021-09-17 NOTE — Telephone Encounter (Signed)
Pt has been scheduled.  °

## 2021-09-18 ENCOUNTER — Other Ambulatory Visit: Payer: Self-pay | Admitting: Family Medicine

## 2021-09-18 DIAGNOSIS — I10 Essential (primary) hypertension: Secondary | ICD-10-CM

## 2021-09-18 NOTE — Patient Instructions (Addendum)
It was good to see you again today!  Please consider getting the shingles vaccine series and COVID booster at your pharmacy if not done already  Let's try increasing your valsartan to 2 of the 160 mg= 320 mg daily for a couple of weeks and let me know how your BP responds   Assuming all is well, please see me in about 6 months

## 2021-09-18 NOTE — Progress Notes (Addendum)
[place] [place], [place], [place] [address] [phone]  Date:  09/23/2021   Name:  Angela Gibbs   DOB:  April 28, 1940   MRN:  [id number]  PCP:  Darreld Mclean, MD    Chief Complaint: Follow-up (Concerns/ questions: BP has been elevated. Lovette Cliche: )   History of Present Illness:  Angela Gibbs is a 81 y.o. very pleasant female patient who presents with the following:  Patient seen today for periodic follow-up-history of TIA, restrictive lung disease due to scoliosis and hiatal hernia, psoriatic arthritis, hypertension, iron deficiency anemia Most recent visit with myself was in January  She was seen by oncology in April to follow-up iron deficiency anemia-lab work at that time showed hyponatremia which we can follow-up today  Fluoxetine 20 Lasix as needed swelling Bystolic 10 mg daily Valsartan 240 daily Prilosec  A family member recently died from an MI- she thinks this may have elevated her BP, she has been stressed out  She continues to take labetalol and valsartan BP Readings from Last 3 Encounters:  09/23/21 (!) 154/98  06/11/21 (!) 157/55  05/15/21 (!) 154/70   She notes she has gained a bit of weight She is not using the furosemide often She does try to wear her compression but they are hard to get on She is doing pulmonary rehab 3x a week  She reports her therapist wonders if there could be some fluid   Wt Readings from Last 3 Encounters:  09/23/21 182 lb 12.8 oz (82.9 kg)  06/11/21 180 lb 1.9 oz (81.7 kg)  02/27/21 179 lb 12.8 oz (81.6 kg)    Patient Active Problem List   Diagnosis Date Noted   IDA (iron deficiency anemia) 01/11/2020   Psoriatic arthritis ([place]) 12/07/2019   Psoriasis 12/07/2019   Osteoarthritis 12/07/2019   TIA (transient ischemic attack) 05/18/2019   Restrictive lung disease 05/13/2016   Moderate persistent asthma without complication 69/62/9528   Current  use of beta blocker 04/11/2016   Other allergic rhinitis 04/11/2016   Gastroesophageal reflux disease without esophagitis 04/11/2016   Essential hypertension 02/20/2016   Abdominal pain, acute, right upper quadrant 07/29/2013    Past Medical History:  Diagnosis Date   Allergy    Concussion    Falls    Head injury    Herpes    Hypertension    Migraines    Nerve damage    Shingles     Past Surgical History:  Procedure Laterality Date   BRAIN SURGERY     CRANIOTOMY FOR EXTRACRANIAL-INTRACRANIAL BYPASS     EYE SURGERY  4.4.17   milia excision   HIP SURGERY     intracranial surgery   NERVE SURGERY     ROOT CANAL     TONSILLECTOMY      Social History   Tobacco Use   Smoking status: Never   Smokeless tobacco: Never  Vaping Use   Vaping Use: Never used  Substance Use Topics   Alcohol use: Yes    Comment: social   Drug use: No    Family History  Problem Relation Age of Onset   Heart disease Father    Heart attack Father     Allergies  Allergen Reactions   Cephalosporins Shortness Of Breath    Breathing difficulty   Ciprofloxacin Hives and Shortness Of Breath   Influenza Virus Vaccine Other (See Comments)    Numbness in legs  Keflex [Cephalexin] Hives   Latex Hives, Itching and Rash   Levaquin [Levofloxacin] Hives, Palpitations and Other (See Comments)    Chest pain   Nitrofuran Derivatives Hives, Itching and Swelling   Penicillins Hives   Sulfacetamide Sodium Hives   Xopenex [Levalbuterol Hcl] Anxiety and Other (See Comments)    Tremors with violent body chattering and shaking   Zostavax [Zoster Vaccine Live] Other (See Comments)    Pt has been told by specialists not to receive vaccine d/t h/o herpes in bloodstream and shingles.   Erythromycin Other (See Comments)    "stomach issues"   Hydrochlorothiazide Other (See Comments)    Migraine   Lisinopril Cough   Petrolatum Nausea Only   Pregabalin Other (See Comments)   Ropinirole Hcl Nausea And  Vomiting   Shellfish Allergy Hives   Sulfa Antibiotics Hives   Aspirin Other (See Comments)    Stomach pain/burning   Avelox [Moxifloxacin Hcl In Nacl] Nausea Only   Cefdinir Nausea And Vomiting   Chromium Other (See Comments)    Blisters   Eggs Or Egg-Derived Products     Per allergy testing, no known reaction. Pt states she was told she could probably eat 2-3 eggs weekly w/o issue.   Iodinated Contrast Media Other (See Comments)    unknown    Lactose Intolerance (Gi) Other (See Comments)    "stomach issues"   Percodan [Oxycodone-Aspirin] Nausea And Vomiting   Prednisone Palpitations    No injections, can do the pills   Troleandomycin Other (See Comments)    Unknown reaction    Medication list has been reviewed and updated.  Current Outpatient Medications on File Prior to Visit  Medication Sig Dispense Refill   ALPRAZolam (XANAX) 0.25 MG tablet Take 1 tablet (0.25 mg total) by mouth 2 (two) times daily as needed for anxiety. 30 tablet 1   Ascorbic Acid (VITAMIN C) 100 MG tablet Take 1,000 mg by mouth daily. Reported on 05/24/2015     butalbital-acetaminophen-caffeine (FIORICET, ESGIC) 50-325-40 MG per tablet Take 2 tablets by mouth 2 (two) times daily as needed for headache.     Cholecalciferol 125 MCG (5000 UT) TABS daily at 0600.     clindamycin (CLEOCIN) 150 MG capsule 450 mg 3 (three) times daily. 1 hour prior to dental procedures and 6 hours post dental procedure     Cyanocobalamin (B-12 COMPLIANCE INJECTION) 1000 MCG/ML KIT Inject as directed.     cyclobenzaprine (FLEXERIL) 5 MG tablet Take 5 mg by mouth 2 (two) times daily as needed.     diclofenac Sodium (VOLTAREN) 1 % GEL APPLY 2 GRAMS EXTERNALLY TO THE AFFECTED AREA FOUR TIMES DAILY 100 g 0   FLUoxetine (PROZAC) 20 MG tablet Take 1 tablet by mouth daily 90 tablet 3   fluticasone (FLONASE) 50 MCG/ACT nasal spray Place 2 sprays into both nostrils daily. 16 g 5   furosemide (LASIX) 20 MG tablet TAKE 1 TABLET BY MOUTH EVERY  DAY ON OCCASION AS NEEDED FOR SWELLING 90 tablet 1   HYDROcodone-acetaminophen (NORCO/VICODIN) 5-325 MG tablet Take 1 tablet by mouth every 6 (six) hours as needed.     Lidocaine 4 % PTCH Apply 1 patch topically daily. Apply for 12 hours, then removed for 12 hours.     MAGNESIUM GLYCINATE PO Take 50 mg by mouth daily.     nebivolol (BYSTOLIC) 10 MG tablet TAKE 1 TABLET(10 MG) BY MOUTH DAILY 90 tablet 0   omeprazole (PRILOSEC OTC) 20 MG tablet Take 20 mg by  mouth daily.     Potassium 99 MG TABS Take 1 tablet by mouth as needed (for leg cramps).     Prednicarbate 0.1 % CREA as needed.     promethazine (PHENERGAN) 25 MG tablet Take 1 tablet (25 mg total) by mouth every 6 (six) hours as needed for nausea or vomiting. 30 tablet 0   sodium chloride (V-R NASAL SPRAY SALINE) 0.65 % nasal spray Place 1 spray into the nose as needed. Use in both nostrils twice daily 30 mL 6   triamcinolone cream (KENALOG) 0.1 % Apply topically 2 (two) times daily. As needed     valsartan (DIOVAN) 160 MG tablet TAKE 1 AND 1/2 TABLETS(240 MG) BY MOUTH DAILY 45 tablet 0   No current facility-administered medications on file prior to visit.    Review of Systems:  As per HPI- otherwise negative.   Physical Examination: Vitals:   09/23/21 1353  BP: (!) 154/98  Pulse: 72  Resp: 18  Temp: 98.2 F (36.8 C)  SpO2: 98%   Vitals:   09/23/21 1353  Weight: 182 lb 12.8 oz (82.9 kg)  Height: 5' 2.5" (1.588 m)   Body mass index is 32.9 kg/m. Ideal Body Weight: Weight in (lb) to have BMI = 25: 138.6  GEN: no acute distress.  Mild obesity, looks well HEENT: Atraumatic, Normocephalic.  Bilateral TM wnl, oropharynx normal.  PEERL,EOMI.   Ears and Nose: No external deformity. CV: RRR, No M/G/R. No JVD. No thrill. No extra heart sounds. PULM: CTA B, no wheezes, crackles, rhonchi. No retractions. No resp. distress. No accessory muscle use. ABD: S, NT, ND. EXTR: No c/c/e PSYCH: Normally interactive. Conversant.     Assessment and Plan: Essential hypertension - Plan: Basic metabolic panel  Gastroesophageal reflux disease without esophagitis  Hyponatremia - Plan: Basic metabolic panel  Reactive depression  Screening for hyperlipidemia - Plan: Lipid panel  Seen today for follow-up.  Her blood pressure is elevated and has been up since March.  We decided to increase her valsartan to 320 mg with what she has on hand, she will let me know how she responds Doing well with Prilosec Depression has been well-controlled with current dose of fluoxetine Follow-up on hyponatremia and recheck lipids today Plan for 97-monthfollow-up assuming all is well  Signed JLamar Blinks MD  Addendum 8/8, received labs as below.  Message to patient  Results for orders placed or performed in visit on 094/17/40 Basic metabolic panel  Result Value Ref Range   Sodium 133 (L) 135 - 145 mEq/L   Potassium 4.6 3.5 - 5.1 mEq/L   Chloride 97 96 - 112 mEq/L   CO2 28 19 - 32 mEq/L   Glucose, Bld 91 70 - 99 mg/dL   BUN 14 6 - 23 mg/dL   Creatinine, Ser 0.63 0.40 - 1.20 mg/dL   GFR 83.40 >60.00 mL/min   Calcium 9.4 8.4 - 10.5 mg/dL  Lipid panel  Result Value Ref Range   Cholesterol 211 (H) 0 - 200 mg/dL   Triglycerides 86.0 0.0 - 149.0 mg/dL   HDL 69.60 >39.00 mg/dL   VLDL 17.2 0.0 - 40.0 mg/dL   LDL Cholesterol 124 (H) 0 - 99 mg/dL   Total CHOL/HDL Ratio 3    NonHDL 141.43

## 2021-09-23 ENCOUNTER — Encounter: Payer: Self-pay | Admitting: Family Medicine

## 2021-09-23 ENCOUNTER — Ambulatory Visit (INDEPENDENT_AMBULATORY_CARE_PROVIDER_SITE_OTHER): Payer: Medicare Other | Admitting: Family Medicine

## 2021-09-23 VITALS — BP 150/95 | HR 72 | Temp 98.2°F | Resp 18 | Ht 62.5 in | Wt 182.8 lb

## 2021-09-23 DIAGNOSIS — E871 Hypo-osmolality and hyponatremia: Secondary | ICD-10-CM

## 2021-09-23 DIAGNOSIS — Z13220 Encounter for screening for lipoid disorders: Secondary | ICD-10-CM

## 2021-09-23 DIAGNOSIS — F329 Major depressive disorder, single episode, unspecified: Secondary | ICD-10-CM

## 2021-09-23 DIAGNOSIS — I10 Essential (primary) hypertension: Secondary | ICD-10-CM | POA: Diagnosis not present

## 2021-09-23 DIAGNOSIS — K219 Gastro-esophageal reflux disease without esophagitis: Secondary | ICD-10-CM

## 2021-09-24 ENCOUNTER — Encounter: Payer: Self-pay | Admitting: Family Medicine

## 2021-09-24 LAB — LIPID PANEL
Cholesterol: 211 mg/dL — ABNORMAL HIGH (ref 0–200)
HDL: 69.6 mg/dL (ref >39.00)
LDL Cholesterol: 124 mg/dL — ABNORMAL HIGH (ref 0–99)
NonHDL: 141.43
Total CHOL/HDL Ratio: 3
Triglycerides: 86 mg/dL (ref 0.0–149.0)
VLDL: 17.2 mg/dL (ref 0.0–40.0)

## 2021-09-24 LAB — BASIC METABOLIC PANEL
BUN: 14 mg/dL (ref 6–23)
CO2: 28 mEq/L (ref 19–32)
Calcium: 9.4 mg/dL (ref 8.4–10.5)
Chloride: 97 mEq/L (ref 96–112)
Creatinine, Ser: 0.63 mg/dL (ref 0.40–1.20)
GFR: 83.4 mL/min (ref >60.00)
Glucose, Bld: 91 mg/dL (ref 70–99)
Potassium: 4.6 mEq/L (ref 3.5–5.1)
Sodium: 133 mEq/L — ABNORMAL LOW (ref 135–145)

## 2021-09-25 DIAGNOSIS — M62838 Other muscle spasm: Secondary | ICD-10-CM | POA: Diagnosis not present

## 2021-09-25 DIAGNOSIS — B0229 Other postherpetic nervous system involvement: Secondary | ICD-10-CM | POA: Diagnosis not present

## 2021-09-25 DIAGNOSIS — G8929 Other chronic pain: Secondary | ICD-10-CM | POA: Diagnosis not present

## 2021-09-25 DIAGNOSIS — Z79899 Other long term (current) drug therapy: Secondary | ICD-10-CM | POA: Diagnosis not present

## 2021-09-25 DIAGNOSIS — G4489 Other headache syndrome: Secondary | ICD-10-CM | POA: Diagnosis not present

## 2021-09-25 DIAGNOSIS — M545 Low back pain, unspecified: Secondary | ICD-10-CM | POA: Diagnosis not present

## 2021-09-27 ENCOUNTER — Other Ambulatory Visit: Payer: Self-pay | Admitting: Family Medicine

## 2021-09-27 DIAGNOSIS — I10 Essential (primary) hypertension: Secondary | ICD-10-CM

## 2021-10-04 ENCOUNTER — Telehealth: Payer: Self-pay | Admitting: Family Medicine

## 2021-10-04 DIAGNOSIS — F410 Panic disorder [episodic paroxysmal anxiety] without agoraphobia: Secondary | ICD-10-CM

## 2021-10-04 MED ORDER — ALPRAZOLAM 0.25 MG PO TABS: 0.2500 mg | ORAL_TABLET | Freq: Two times a day (BID) | ORAL | 0 refills | Status: DC | PRN

## 2021-10-04 NOTE — Telephone Encounter (Signed)
Medication:   ALPRAZolam (XANAX) 0.25 MG tablet [[id number]]  Has the patient contacted their pharmacy? No. (If no, request that the patient contact the pharmacy for the refill.) (If yes, when and what did the pharmacy advise?)  Preferred Pharmacy (with phone number or street name):   [id number] - [place], [address]  [place], [place] [address]  Phone:  [phone]  Fax:  [phone]   Agent: Please be advised that RX refills may take up to 3 business days. We ask that you follow-up with your pharmacy.

## 2021-10-08 DIAGNOSIS — Z1231 Encounter for screening mammogram for malignant neoplasm of breast: Secondary | ICD-10-CM | POA: Diagnosis not present

## 2021-10-09 ENCOUNTER — Telehealth: Payer: Self-pay | Admitting: Family Medicine

## 2021-10-09 NOTE — Telephone Encounter (Signed)
Left message for patient to call back and schedule Medicare Annual Wellness Visit (AWV).   Please offer to do virtually or by telephone.  Left office number and my jabber [phone].  Last AWV:10/09/2020  Please schedule at anytime with Nurse Health Advisor.

## 2021-10-11 DIAGNOSIS — N6489 Other specified disorders of breast: Secondary | ICD-10-CM | POA: Diagnosis not present

## 2021-10-11 DIAGNOSIS — R928 Other abnormal and inconclusive findings on diagnostic imaging of breast: Secondary | ICD-10-CM | POA: Diagnosis not present

## 2021-10-11 DIAGNOSIS — N6314 Unspecified lump in the right breast, lower inner quadrant: Secondary | ICD-10-CM | POA: Diagnosis not present

## 2021-10-23 MED ORDER — VALSARTAN 160 MG PO TABS: ORAL_TABLET | ORAL | 1 refills | Status: DC

## 2021-10-24 ENCOUNTER — Other Ambulatory Visit: Payer: Self-pay | Admitting: Family Medicine

## 2021-10-26 NOTE — Patient Instructions (Incomplete)
It was great to see you again today If not done already, consider getting the shingles vaccine series/Shingrix at your pharmacy I would also recommend a COVID booster this fall if needed  I will refer you to see cardiology and also to have an echocardiogram to check on your heart! Let's add furosemide 20 mg daily

## 2021-10-26 NOTE — Progress Notes (Unsigned)
[place] [facility] [address] [place], [place] [address] [phone]  Date:  10/28/2021   Name:  Angela Gibbs   DOB:  01/23/1941   MRN:  [id number]  PCP:  Darreld Mclean, MD    Chief Complaint: No chief complaint on file.   History of Present Illness:  Angela Gibbs is a 81 y.o. very pleasant female patient who presents with the following:  Patient seen today for follow-up- history of TIA, restrictive lung disease due to scoliosis and hiatal hernia, psoriatic arthritis, hypertension, iron deficiency anemia  Most recent visit with myself was about 1 month ago to discuss blood pressure-at that time her pressures have been mildly elevated, running 150s over 50-90 At last visit we increased her dose of valsartan to 320 mg with what she had on hand  At her last visit she was participating in pulmonary rehab 3 times a week She has history of intolerance to flu vaccine Patient Active Problem List   Diagnosis Date Noted   IDA (iron deficiency anemia) 01/11/2020   Psoriatic arthritis ([place]) 12/07/2019   Psoriasis 12/07/2019   Osteoarthritis 12/07/2019   TIA (transient ischemic attack) 05/18/2019   Restrictive lung disease 05/13/2016   Moderate persistent asthma without complication 37/05/8887   Current use of beta blocker 04/11/2016   Other allergic rhinitis 04/11/2016   Gastroesophageal reflux disease without esophagitis 04/11/2016   Essential hypertension 02/20/2016   Abdominal pain, acute, right upper quadrant 07/29/2013    Past Medical History:  Diagnosis Date   Allergy    Concussion    Falls    Head injury    Herpes    Hypertension    Migraines    Nerve damage    Shingles     Past Surgical History:  Procedure Laterality Date   BRAIN SURGERY     CRANIOTOMY FOR EXTRACRANIAL-INTRACRANIAL BYPASS     EYE SURGERY  4.4.17   milia excision   HIP SURGERY     intracranial surgery   NERVE SURGERY      ROOT CANAL     TONSILLECTOMY      Social History   Tobacco Use   Smoking status: Never   Smokeless tobacco: Never  Vaping Use   Vaping Use: Never used  Substance Use Topics   Alcohol use: Yes    Comment: social   Drug use: No    Family History  Problem Relation Age of Onset   Heart disease Father    Heart attack Father     Allergies  Allergen Reactions   Cephalosporins Shortness Of Breath    Breathing difficulty   Ciprofloxacin Hives and Shortness Of Breath   Influenza Virus Vaccine Other (See Comments)    Numbness in legs   Keflex [Cephalexin] Hives   Latex Hives, Itching and Rash   Levaquin [Levofloxacin] Hives, Palpitations and Other (See Comments)    Chest pain   Nitrofuran Derivatives Hives, Itching and Swelling   Penicillins Hives   Sulfacetamide Sodium Hives   Xopenex [Levalbuterol Hcl] Anxiety and Other (See Comments)    Tremors with violent body chattering and shaking   Zostavax [Zoster Vaccine Live] Other (See Comments)    Pt has been told by specialists not to receive vaccine d/t h/o herpes in bloodstream and shingles.   Erythromycin Other (See Comments)    "stomach issues"   Hydrochlorothiazide Other (See Comments)    Migraine   Lisinopril Cough  Petrolatum Nausea Only   Pregabalin Other (See Comments)   Ropinirole Hcl Nausea And Vomiting   Shellfish Allergy Hives   Sulfa Antibiotics Hives   Aspirin Other (See Comments)    Stomach pain/burning   Avelox [Moxifloxacin Hcl In Nacl] Nausea Only   Cefdinir Nausea And Vomiting   Chromium Other (See Comments)    Blisters   Eggs Or Egg-Derived Products     Per allergy testing, no known reaction. Pt states she was told she could probably eat 2-3 eggs weekly w/o issue.   Iodinated Contrast Media Other (See Comments)    unknown    Lactose Intolerance (Gi) Other (See Comments)    "stomach issues"   Percodan [Oxycodone-Aspirin] Nausea And Vomiting   Prednisone Palpitations    No injections, can do the  pills   Troleandomycin Other (See Comments)    Unknown reaction    Medication list has been reviewed and updated.  Current Outpatient Medications on File Prior to Visit  Medication Sig Dispense Refill   ALPRAZolam (XANAX) 0.25 MG tablet Take 1 tablet (0.25 mg total) by mouth 2 (two) times daily as needed for anxiety. 30 tablet 0   Ascorbic Acid (VITAMIN C) 100 MG tablet Take 1,000 mg by mouth daily. Reported on 05/24/2015     butalbital-acetaminophen-caffeine (FIORICET, ESGIC) 50-325-40 MG per tablet Take 2 tablets by mouth 2 (two) times daily as needed for headache.     Cholecalciferol 125 MCG (5000 UT) TABS daily at 0600.     clindamycin (CLEOCIN) 150 MG capsule 450 mg 3 (three) times daily. 1 hour prior to dental procedures and 6 hours post dental procedure     Cyanocobalamin (B-12 COMPLIANCE INJECTION) 1000 MCG/ML KIT Inject as directed.     cyclobenzaprine (FLEXERIL) 5 MG tablet Take 5 mg by mouth 2 (two) times daily as needed.     diclofenac Sodium (VOLTAREN) 1 % GEL APPLY 2 GRAMS EXTERNALLY TO THE AFFECTED AREA FOUR TIMES DAILY 100 g 0   FLUoxetine (PROZAC) 20 MG tablet Take 1 tablet by mouth daily 90 tablet 3   fluticasone (FLONASE) 50 MCG/ACT nasal spray Place 2 sprays into both nostrils daily. 16 g 5   furosemide (LASIX) 20 MG tablet TAKE 1 TABLET BY MOUTH EVERY DAY ON OCCASION AS NEEDED FOR SWELLING 90 tablet 1   HYDROcodone-acetaminophen (NORCO/VICODIN) 5-325 MG tablet Take 1 tablet by mouth every 6 (six) hours as needed.     Lidocaine 4 % PTCH Apply 1 patch topically daily. Apply for 12 hours, then removed for 12 hours.     MAGNESIUM GLYCINATE PO Take 50 mg by mouth daily.     nebivolol (BYSTOLIC) 10 MG tablet TAKE 1 TABLET(10 MG) BY MOUTH DAILY 90 tablet 0   omeprazole (PRILOSEC OTC) 20 MG tablet Take 20 mg by mouth daily.     Potassium 99 MG TABS Take 1 tablet by mouth as needed (for leg cramps).     Prednicarbate 0.1 % CREA as needed.     promethazine (PHENERGAN) 25 MG tablet  Take 1 tablet (25 mg total) by mouth every 6 (six) hours as needed for nausea or vomiting. 30 tablet 0   sodium chloride (V-R NASAL SPRAY SALINE) 0.65 % nasal spray Place 1 spray into the nose as needed. Use in both nostrils twice daily 30 mL 6   triamcinolone cream (KENALOG) 0.1 % Apply topically 2 (two) times daily. As needed     valsartan (DIOVAN) 160 MG tablet Take 1.5 tablets by mouth  daily 135 tablet 1   No current facility-administered medications on file prior to visit.    Review of Systems:  As per HPI- otherwise negative.   Physical Examination: There were no vitals filed for this visit. There were no vitals filed for this visit. There is no height or weight on file to calculate BMI. Ideal Body Weight:    GEN: no acute distress. HEENT: Atraumatic, Normocephalic.  Ears and Nose: No external deformity. CV: RRR, No M/G/R. No JVD. No thrill. No extra heart sounds. PULM: CTA B, no wheezes, crackles, rhonchi. No retractions. No resp. distress. No accessory muscle use. ABD: S, NT, ND, +BS. No rebound. No HSM. EXTR: No c/c/e PSYCH: Normally interactive. Conversant.    Assessment and Plan: ***  Signed Lamar Blinks, MD

## 2021-10-28 ENCOUNTER — Ambulatory Visit (INDEPENDENT_AMBULATORY_CARE_PROVIDER_SITE_OTHER): Payer: Medicare Other | Admitting: Family Medicine

## 2021-10-28 VITALS — BP 160/90 | HR 76 | Temp 97.8°F | Resp 20 | Ht 62.5 in | Wt 184.0 lb

## 2021-10-28 DIAGNOSIS — F410 Panic disorder [episodic paroxysmal anxiety] without agoraphobia: Secondary | ICD-10-CM | POA: Diagnosis not present

## 2021-10-28 DIAGNOSIS — I10 Essential (primary) hypertension: Secondary | ICD-10-CM | POA: Diagnosis not present

## 2021-10-28 MED ORDER — ALPRAZOLAM 0.25 MG PO TABS: 0.2500 mg | ORAL_TABLET | Freq: Two times a day (BID) | ORAL | 1 refills | Status: DC | PRN

## 2021-10-28 MED ORDER — FUROSEMIDE 20 MG PO TABS: 20.0000 mg | ORAL_TABLET | Freq: Every day | ORAL | 3 refills | Status: DC

## 2021-10-28 MED ORDER — VALSARTAN 320 MG PO TABS: 320.0000 mg | ORAL_TABLET | Freq: Every day | ORAL | 3 refills | Status: DC

## 2021-10-29 ENCOUNTER — Ambulatory Visit (HOSPITAL_COMMUNITY)
Admission: RE | Admit: 2021-10-29 | Discharge: 2021-10-29 | Disposition: A | Discharge status: Home / Self Care | Payer: Medicare Other | Source: Ambulatory Visit | Attending: Family Medicine | Admitting: Family Medicine

## 2021-10-29 DIAGNOSIS — R609 Edema, unspecified: Secondary | ICD-10-CM | POA: Diagnosis not present

## 2021-10-29 DIAGNOSIS — R0602 Shortness of breath: Secondary | ICD-10-CM | POA: Diagnosis not present

## 2021-10-29 DIAGNOSIS — J984 Other disorders of lung: Secondary | ICD-10-CM | POA: Diagnosis not present

## 2021-10-29 DIAGNOSIS — Z8249 Family history of ischemic heart disease and other diseases of the circulatory system: Secondary | ICD-10-CM | POA: Insufficient documentation

## 2021-10-29 DIAGNOSIS — G459 Transient cerebral ischemic attack, unspecified: Secondary | ICD-10-CM | POA: Diagnosis not present

## 2021-10-29 DIAGNOSIS — I10 Essential (primary) hypertension: Secondary | ICD-10-CM | POA: Insufficient documentation

## 2021-10-29 DIAGNOSIS — I350 Nonrheumatic aortic (valve) stenosis: Secondary | ICD-10-CM | POA: Diagnosis not present

## 2021-10-29 DIAGNOSIS — R9431 Abnormal electrocardiogram [ECG] [EKG]: Secondary | ICD-10-CM | POA: Insufficient documentation

## 2021-10-29 LAB — ECHOCARDIOGRAM COMPLETE
Area-P 1/2: 2.4 cm2
S' Lateral: 2.2 cm

## 2021-11-03 ENCOUNTER — Telehealth: Payer: Self-pay | Admitting: Family Medicine

## 2021-11-03 ENCOUNTER — Encounter: Payer: Self-pay | Admitting: Family Medicine

## 2021-11-03 DIAGNOSIS — I10 Essential (primary) hypertension: Secondary | ICD-10-CM

## 2021-11-03 MED ORDER — SPIRONOLACTONE 25 MG PO TABS: 12.5000 mg | ORAL_TABLET | Freq: Every day | ORAL | 3 refills | Status: DC

## 2021-11-03 NOTE — Telephone Encounter (Signed)
Left message on patient machine regarding adding spironolactone, MyChart message sent

## 2021-11-06 ENCOUNTER — Telehealth: Payer: Self-pay | Admitting: Family Medicine

## 2021-11-06 NOTE — Telephone Encounter (Signed)
Patient would like to know what the results of her echocardiogram. She hasn't heard anything yet.

## 2021-11-06 NOTE — Telephone Encounter (Signed)
See below: I see where a MyCart message was sent to the pt, but she did not read it. Was this in regard to her Echo?

## 2021-11-06 NOTE — Telephone Encounter (Signed)
Called patient and went over her recent echo report, apologize that I had not realized a copy was not sent to her yet.  Overall echo looks good, some increase in pulmonary artery pressure.  I have already touched base with cardiology, they are seeing her in November

## 2021-11-21 ENCOUNTER — Ambulatory Visit (INDEPENDENT_AMBULATORY_CARE_PROVIDER_SITE_OTHER): Payer: Medicare Other | Admitting: *Deleted

## 2021-11-21 DIAGNOSIS — Z0000 Encounter for general adult medical examination without abnormal findings: Secondary | ICD-10-CM | POA: Diagnosis not present

## 2021-11-21 NOTE — Patient Instructions (Signed)
[name] , Thank you for taking time to come for your Medicare Wellness Visit. I appreciate your ongoing commitment to your health goals. Please review the following plan we discussed and let me know if I can assist you in the future.   These are the goals we discussed:  Goals      Lose 20lb     Follow weight watchers or other healthy diet     Patient Stated     Maintain current health        This is a list of the screening recommended for you and due dates:  Health Maintenance  Topic Date Due   Zoster (Shingles) Vaccine (1 of 2) Never done   COVID-19 Vaccine (3 - Pfizer risk series) 04/27/2019   Tetanus Vaccine  06/12/2026   Pneumonia Vaccine  Completed   DEXA scan (bone density measurement)  Completed   HPV Vaccine  Aged Out     Next appointment: Follow up in one year for your annual wellness visit    Preventive Care 11 Years and Older, Female Preventive care refers to lifestyle choices and visits with your health care provider that can promote health and wellness. What does preventive care include? A yearly physical exam. This is also called an annual well check. Dental exams once or twice a year. Routine eye exams. Ask your health care provider how often you should have your eyes checked. Personal lifestyle choices, including: Daily care of your teeth and gums. Regular physical activity. Eating a healthy diet. Avoiding tobacco and drug use. Limiting alcohol use. Practicing safe sex. Taking low-dose aspirin every day. Taking vitamin and mineral supplements as recommended by your health care provider. What happens during an annual well check? The services and screenings done by your health care provider during your annual well check will depend on your age, overall health, lifestyle risk factors, and family history of disease. Counseling  Your health care provider may ask you questions about your: Alcohol use. Tobacco use. Drug use. Emotional well-being. Home  and relationship well-being. Sexual activity. Eating habits. History of falls. Memory and ability to understand (cognition). Work and work Statistician. Reproductive health. Screening  You may have the following tests or measurements: Height, weight, and BMI. Blood pressure. Lipid and cholesterol levels. These may be checked every 5 years, or more frequently if you are over 48 years old. Skin check. Lung cancer screening. You may have this screening every year starting at age 50 if you have a 30-pack-year history of smoking and currently smoke or have quit within the past 15 years. Fecal occult blood test (FOBT) of the stool. You may have this test every year starting at age 73. Flexible sigmoidoscopy or colonoscopy. You may have a sigmoidoscopy every 5 years or a colonoscopy every 10 years starting at age 33. Hepatitis C blood test. Hepatitis B blood test. Sexually transmitted disease (STD) testing. Diabetes screening. This is done by checking your blood sugar (glucose) after you have not eaten for a while (fasting). You may have this done every 1-3 years. Bone density scan. This is done to screen for osteoporosis. You may have this done starting at age 15. Mammogram. This may be done every 1-2 years. Talk to your health care provider about how often you should have regular mammograms. Talk with your health care provider about your test results, treatment options, and if necessary, the need for more tests. Vaccines  Your health care provider may recommend certain vaccines, such as: Influenza vaccine.  This is recommended every year. Tetanus, diphtheria, and acellular pertussis (Tdap, Td) vaccine. You may need a Td booster every 10 years. Zoster vaccine. You may need this after age 12. Pneumococcal 13-valent conjugate (PCV13) vaccine. One dose is recommended after age 66. Pneumococcal polysaccharide (PPSV23) vaccine. One dose is recommended after age 59. Talk to your health care provider  about which screenings and vaccines you need and how often you need them. This information is not intended to replace advice given to you by your health care provider. Make sure you discuss any questions you have with your health care provider. Document Released: 03/02/2015 Document Revised: 10/24/2015 Document Reviewed: 12/05/2014 Elsevier Interactive Patient Education  2017 [place] Prevention in the Home Falls can cause injuries. They can happen to people of all ages. There are many things you can do to make your home safe and to help prevent falls. What can I do on the outside of my home? Regularly fix the edges of walkways and driveways and fix any cracks. Remove anything that might make you trip as you walk through a door, such as a raised step or threshold. Trim any bushes or trees on the path to your home. Use bright outdoor lighting. Clear any walking paths of anything that might make someone trip, such as rocks or tools. Regularly check to see if handrails are loose or broken. Make sure that both sides of any steps have handrails. Any raised decks and porches should have guardrails on the edges. Have any leaves, snow, or ice cleared regularly. Use sand or salt on walking paths during winter. Clean up any spills in your garage right away. This includes oil or grease spills. What can I do in the bathroom? Use night lights. Install grab bars by the toilet and in the tub and shower. Do not use towel bars as grab bars. Use non-skid mats or decals in the tub or shower. If you need to sit down in the shower, use a plastic, non-slip stool. Keep the floor dry. Clean up any water that spills on the floor as soon as it happens. Remove soap buildup in the tub or shower regularly. Attach bath mats securely with double-sided non-slip rug tape. Do not have throw rugs and other things on the floor that can make you trip. What can I do in the bedroom? Use night lights. Make sure  that you have a light by your bed that is easy to reach. Do not use any sheets or blankets that are too big for your bed. They should not hang down onto the floor. Have a firm chair that has side arms. You can use this for support while you get dressed. Do not have throw rugs and other things on the floor that can make you trip. What can I do in the kitchen? Clean up any spills right away. Avoid walking on wet floors. Keep items that you use a lot in easy-to-reach places. If you need to reach something above you, use a strong step stool that has a grab bar. Keep electrical cords out of the way. Do not use floor polish or wax that makes floors slippery. If you must use wax, use non-skid floor wax. Do not have throw rugs and other things on the floor that can make you trip. What can I do with my stairs? Do not leave any items on the stairs. Make sure that there are handrails on both sides of the stairs and use them. Fix handrails  that are broken or loose. Make sure that handrails are as long as the stairways. Check any carpeting to make sure that it is firmly attached to the stairs. Fix any carpet that is loose or worn. Avoid having throw rugs at the top or bottom of the stairs. If you do have throw rugs, attach them to the floor with carpet tape. Make sure that you have a light switch at the top of the stairs and the bottom of the stairs. If you do not have them, ask someone to add them for you. What else can I do to help prevent falls? Wear shoes that: Do not have high heels. Have rubber bottoms. Are comfortable and fit you well. Are closed at the toe. Do not wear sandals. If you use a stepladder: Make sure that it is fully opened. Do not climb a closed stepladder. Make sure that both sides of the stepladder are locked into place. Ask someone to hold it for you, if possible. Clearly mark and make sure that you can see: Any grab bars or handrails. First and last steps. Where the edge of  each step is. Use tools that help you move around (mobility aids) if they are needed. These include: Canes. Walkers. Scooters. Crutches. Turn on the lights when you go into a dark area. Replace any light bulbs as soon as they burn out. Set up your furniture so you have a clear path. Avoid moving your furniture around. If any of your floors are uneven, fix them. If there are any pets around you, be aware of where they are. Review your medicines with your doctor. Some medicines can make you feel dizzy. This can increase your chance of falling. Ask your doctor what other things that you can do to help prevent falls. This information is not intended to replace advice given to you by your health care provider. Make sure you discuss any questions you have with your health care provider. Document Released: 11/30/2008 Document Revised: 07/12/2015 Document Reviewed: 03/10/2014 Elsevier Interactive Patient Education  2017 [employer].

## 2021-11-21 NOTE — Progress Notes (Signed)
Subjective:   Angela Gibbs is a 81 y.o. female who presents for Medicare Annual (Subsequent) preventive examination.  I connected with  Kasumi M Fede on 11/21/21 by a audio enabled telemedicine application and verified that I am speaking with the correct person using two identifiers.  Patient Location: Home  Provider Location: Office/Clinic  I discussed the limitations of evaluation and management by telemedicine. The patient expressed understanding and agreed to proceed.   Review of Systems    Defer to PCP Cardiac Risk Factors include: hypertension;advanced age (>23mn, >>49women)     Objective:    There were no vitals filed for this visit. There is no height or weight on file to calculate BMI.     11/21/2021    2:28 PM 12/11/2020    1:44 PM 03/13/2020    2:18 PM 01/10/2020    1:48 PM 01/03/2020   11:42 AM 05/18/2019    7:32 PM 12/30/2018    3:40 PM  Advanced Directives  Does Patient Have a Medical Advance Directive? Yes Yes Yes Yes Yes No Yes  Type of AParamedicof [place]Living will [place]Living will [place] will H[place]Living will H[place]Living will  [place] CityLiving will  Does patient want to make changes to medical advance directive? No - Patient declined  No - Patient declined No - Patient declined   No - Patient declined  Copy of HNev[place] Cityin Chart? No - copy requested No - copy requested No - copy requested No - copy requested   No - copy requested    Current Medications (verified) Outpatient Encounter Medications as of 11/21/2021  Medication Sig   ALPRAZolam (XANAX) 0.25 MG tablet Take 1 tablet (0.25 mg total) by mouth 2 (two) times daily as needed for anxiety.   Ascorbic Acid (VITAMIN C) 100 MG tablet Take 1,000 mg by mouth daily. Reported on 05/24/2015   butalbital-acetaminophen-caffeine (FIORICET,  ESGIC) 50-325-40 MG per tablet Take 2 tablets by mouth 2 (two) times daily as needed for headache.   Cholecalciferol 125 MCG (5000 UT) TABS daily at 0600.   clindamycin (CLEOCIN) 150 MG capsule 450 mg 3 (three) times daily. 1 hour prior to dental procedures and 6 hours post dental procedure   Cyanocobalamin (B-12 COMPLIANCE INJECTION) 1000 MCG/ML KIT Inject as directed.   cyclobenzaprine (FLEXERIL) 5 MG tablet Take 5 mg by mouth 2 (two) times daily as needed.   diclofenac Sodium (VOLTAREN) 1 % GEL APPLY 2 GRAMS EXTERNALLY TO THE AFFECTED AREA FOUR TIMES DAILY   FLUoxetine (PROZAC) 20 MG tablet Take 1 tablet by mouth daily   fluticasone (FLONASE) 50 MCG/ACT nasal spray Place 2 sprays into both nostrils daily.   furosemide (LASIX) 20 MG tablet Take 1 tablet (20 mg total) by mouth daily.   HYDROcodone-acetaminophen (NORCO/VICODIN) 5-325 MG tablet Take 1 tablet by mouth every 6 (six) hours as needed.   Lidocaine 4 % PTCH Apply 1 patch topically daily. Apply for 12 hours, then removed for 12 hours.   MAGNESIUM GLYCINATE PO Take 50 mg by mouth daily.   nebivolol (BYSTOLIC) 10 MG tablet TAKE 1 TABLET(10 MG) BY MOUTH DAILY   omeprazole (PRILOSEC OTC) 20 MG tablet Take 20 mg by mouth daily.   Prednicarbate 0.1 % CREA as needed.   promethazine (PHENERGAN) 25 MG tablet Take 1 tablet (25 mg total) by mouth every 6 (six) hours as needed for nausea or  vomiting.   sodium chloride (V-R NASAL SPRAY SALINE) 0.65 % nasal spray Place 1 spray into the nose as needed. Use in both nostrils twice daily   spironolactone (ALDACTONE) 25 MG tablet Take 0.5 tablets (12.5 mg total) by mouth daily.   triamcinolone cream (KENALOG) 0.1 % Apply topically 2 (two) times daily. As needed   valsartan (DIOVAN) 320 MG tablet Take 1 tablet (320 mg total) by mouth daily.   No facility-administered encounter medications on file as of 11/21/2021.    Allergies (verified) Cephalosporins, Ciprofloxacin, Influenza virus vaccine, Keflex  [cephalexin], Latex, Levaquin [levofloxacin], Nitrofuran derivatives, Penicillins, Sulfacetamide sodium, Xopenex [levalbuterol hcl], Zostavax [zoster vaccine live], Erythromycin, Hydrochlorothiazide, Lisinopril, Petrolatum, Pregabalin, Ropinirole hcl, Shellfish allergy, Sulfa antibiotics, Aspirin, Avelox [moxifloxacin hcl in nacl], Cefdinir, Chromium, Eggs or egg-derived products, Iodinated contrast media, Lactose intolerance (gi), Percodan [oxycodone-aspirin], Prednisone, and Troleandomycin   History: Past Medical History:  Diagnosis Date   Allergy    Concussion    Falls    Head injury    Herpes    Hypertension    Migraines    Nerve damage    Shingles    Past Surgical History:  Procedure Laterality Date   BRAIN SURGERY     CRANIOTOMY FOR EXTRACRANIAL-INTRACRANIAL BYPASS     EYE SURGERY  4.4.17   milia excision   HIP SURGERY     intracranial surgery   NERVE SURGERY     ROOT CANAL     TONSILLECTOMY     Family History  Problem Relation Age of Onset   Heart disease Father    Heart attack Father    Social History   Socioeconomic History   Marital status: Married    Spouse name: Not on file   Number of children: Not on file   Years of education: Not on file   Highest education level: Not on file  Occupational History   Not on file  Tobacco Use   Smoking status: Never   Smokeless tobacco: Never  Vaping Use   Vaping Use: Never used  Substance and Sexual Activity   Alcohol use: Yes    Comment: social   Drug use: No   Sexual activity: Yes  Other Topics Concern   Not on file  Social History Narrative   Not on file   Social Determinants of Health   Financial Resource Strain: Low Risk  (01/25/2021)   Overall Financial Resource Strain (CARDIA)    Difficulty of Paying Living Expenses: Not hard at all  Food Insecurity: No Food Insecurity (12/27/2020)   Hunger Vital Sign    Worried About Estate manager/land agent of Food in the Last Year: Never true    Ran Out of Food in the Last  Year: Never true  Transportation Needs: No Transportation Needs (12/27/2020)   PRAPARE - Hydrologist (Medical): No    Lack of Transportation (Non-Medical): No  Physical Activity: Sufficiently Active (10/09/2020)   Exercise Vital Sign    Days of Exercise per Week: 3 days    Minutes of Exercise per Session: 60 min  Stress: No Stress Concern Present (11/21/2021)   [place]    Feeling of Stress : Not at all  Social Connections: [place] (11/21/2021)   Social Connection and Isolation Panel [NHANES]    Frequency of Communication with Friends and Family: More than three times a week    Frequency of Social Gatherings with Friends and Family: More than three  times a week    Attends Religious Services: More than 4 times per year    Active Member of Clubs or Organizations: Yes    Attends Music therapist: More than 4 times per year    Marital Status: Married    Tobacco Counseling Counseling given: Not Answered   Clinical Intake:  Pre-visit preparation completed: Yes  Pain : No/denies pain  How often do you need to have someone help you when you read instructions, pamphlets, or other written materials from your doctor or pharmacy?: 1 - Never  Diabetic? No   Activities of Daily Living    11/21/2021    2:42 PM  In your present state of health, do you have any difficulty performing the following activities:  Hearing? 0  Vision? 0  Difficulty concentrating or making decisions? 0  Walking or climbing stairs? 0  Dressing or bathing? 0  Doing errands, shopping? 0  Preparing Food and eating ? N  Using the Toilet? N  In the past six months, have you accidently leaked urine? Y  Do you have problems with loss of bowel control? N  Managing your Medications? N  Managing your Finances? N  Housekeeping or managing your Housekeeping? N    Patient Care Team: Copland,  Gay Filler, MD as PCP - General (Family Medicine) Luellen Pucker, MD as Consulting Physician (Obstetrics and Gynecology) Arta Bruce, OD as Consulting Physician (Ophthalmology) Sabra Heck Precious Haws, MD as Consulting Physician (Neurology) Sylvie Farrier, NP as Consulting Physician (Pain Medicine) Rolan Lipa, MD as Consulting Physician (Gastroenterology) Kerney Elbe, MD as Consulting Physician (Internal Medicine) Lucia Bitter., MD as Physician Assistant (Pain Medicine)  Indicate any recent Medical Services you may have received from other than [facility] providers in the past year (date may be approximate).     Assessment:   This is a routine wellness examination for [place].  Hearing/Vision screen No results found.  Dietary issues and exercise activities discussed: Current Exercise Habits: The patient does not participate in regular exercise at present, Exercise limited by: respiratory conditions(s)   Goals Addressed   None    Depression Screen    11/21/2021    2:38 PM 09/23/2021    2:03 PM 10/09/2020    3:26 PM 08/31/2019    9:38 AM 03/06/2017    3:53 PM 05/24/2015    2:43 PM 05/24/2015    2:41 PM  PHQ 2/9 Scores  PHQ - 2 Score 0 0 0 6 0 0   PHQ- 9 Score  0  21     Exception Documentation      Patient refusal Patient refusal    Fall Risk    11/21/2021    2:38 PM 10/09/2020    3:25 PM 01/12/2019   10:46 AM 03/06/2017    3:53 PM 05/24/2015    2:43 PM  Fall Risk   Falls in the past year? 0 0 0 Yes No  Comment   Emmi Telephone Survey: data to providers prior to load    Number falls in past yr: 0 0  1   Injury with Fall? 0 0  No   Risk for fall due to : No Fall Risks      Follow up Falls evaluation completed Falls prevention discussed  Education provided;Falls prevention discussed     FALL RISK PREVENTION PERTAINING TO THE HOME:  Any stairs in or around the home? Yes  If so, are there any without handrails? No  Home free of loose  throw rugs in  walkways, pet beds, electrical cords, etc? Yes  Adequate lighting in your home to reduce risk of falls? Yes   ASSISTIVE DEVICES UTILIZED TO PREVENT FALLS:  Life alert? No  Use of a cane, walker or w/c? No  Grab bars in the bathroom? Yes  Shower chair or bench in shower? Yes  Elevated toilet seat or a handicapped toilet? Yes   TIMED UP AND GO:  Was the test performed? No . Audio visit   Cognitive Function:        11/21/2021    2:56 PM  6CIT Screen  What Year? 0 points  What month? 0 points  What time? 0 points  Count back from 20 0 points  Months in reverse 2 points  Repeat phrase 0 points  Total Score 2 points    Immunizations Immunization History  Administered Date(s) Administered   PFIZER(Purple Top)SARS-COV-2 Vaccination 03/09/2019, 03/30/2019   Pneumococcal Conjugate-13 11/10/2013, 02/20/2016   Pneumococcal Polysaccharide-23 01/24/2019   Td 06/11/2016   Tdap 02/17/2006    TDAP status: Up to date  Flu Vaccine status: Declined, Education has been provided regarding the importance of this vaccine but patient still declined. Advised may receive this vaccine at local pharmacy or Health Dept. Aware to provide a copy of the vaccination record if obtained from local pharmacy or Health Dept. Verbalized acceptance and understanding.  Pneumococcal vaccine status: Up to date  Covid-19 vaccine status: Information provided on how to obtain vaccines.   Qualifies for Shingles Vaccine? Yes   Zostavax completed No   Shingrix Completed?: No.    Education has been provided regarding the importance of this vaccine. Patient has been advised to call insurance company to determine out of pocket expense if they have not yet received this vaccine. Advised may also receive vaccine at local pharmacy or Health Dept. Verbalized acceptance and understanding.  Screening Tests Health Maintenance  Topic Date Due   Zoster Vaccines- Shingrix (1 of 2) Never done   COVID-19 Vaccine (3 - Pfizer  risk series) 04/27/2019   TETANUS/TDAP  06/12/2026   Pneumonia Vaccine 70+ Years old  Completed   DEXA SCAN  Completed   HPV VACCINES  Aged Out    Health Maintenance  Health Maintenance Due  Topic Date Due   Zoster Vaccines- Shingrix (1 of 2) Never done   COVID-19 Vaccine (3 - Pfizer risk series) 04/27/2019    Colorectal cancer screening: No longer required.   Mammogram status: Completed 10/08/21. Repeat every year  Lung Cancer Screening: (Low Dose CT Chest recommended if Age 70-80 years, 30 pack-year currently smoking OR have quit w/in 15years.) does not qualify.   Lung Cancer Screening Referral: N/a  Additional Screening:  Hepatitis C Screening: does not qualify; Completed N/a  Vision Screening: Recommended annual ophthalmology exams for early detection of glaucoma and other disorders of the eye. Is the patient up to date with their annual eye exam?  Yes  Who is the provider or what is the name of the office in which the patient attends annual eye exams? Dr. Antionette Fairy If pt is not established with a provider, would they like to be referred to a provider to establish care? No .   Dental Screening: Recommended annual dental exams for proper oral hygiene  Community Resource Referral / Chronic Care Management: CRR required this visit?  No   CCM required this visit?  No      Plan:     I have personally reviewed and noted the  following in the patient's chart:   Medical and social history Use of alcohol, tobacco or illicit drugs  Current medications and supplements including opioid prescriptions. Patient is currently taking opioid prescriptions. Information provided to patient regarding non-opioid alternatives. Patient advised to discuss non-opioid treatment plan with their provider. Functional ability and status Nutritional status Physical activity Advanced directives List of other physicians Hospitalizations, surgeries, and ER visits in previous 12  months Vitals Screenings to include cognitive, depression, and falls Referrals and appointments  In addition, I have reviewed and discussed with patient certain preventive protocols, quality metrics, and best practice recommendations. A written personalized care plan for preventive services as well as general preventive health recommendations were provided to patient.   Due to this being a telephonic visit, the after visit summary with patients personalized plan was offered to patient via mail or my-chart. Patient would like to access on my-chart.  Beatris Ship, [place]   11/21/2021   Nurse Notes: None

## 2021-12-04 ENCOUNTER — Other Ambulatory Visit: Payer: Self-pay | Admitting: Family Medicine

## 2021-12-04 DIAGNOSIS — I10 Essential (primary) hypertension: Secondary | ICD-10-CM

## 2021-12-10 ENCOUNTER — Other Ambulatory Visit: Payer: Self-pay | Admitting: Family

## 2021-12-10 DIAGNOSIS — D500 Iron deficiency anemia secondary to blood loss (chronic): Secondary | ICD-10-CM

## 2021-12-11 ENCOUNTER — Inpatient Hospital Stay (HOSPITAL_BASED_OUTPATIENT_CLINIC_OR_DEPARTMENT_OTHER): Payer: Medicare Other | Admitting: Family

## 2021-12-11 ENCOUNTER — Inpatient Hospital Stay: Payer: Medicare Other | Attending: Hematology & Oncology

## 2021-12-11 ENCOUNTER — Encounter: Payer: Self-pay | Admitting: Family

## 2021-12-11 VITALS — BP 155/59 | HR 55 | Temp 97.8°F | Resp 18 | Wt 182.1 lb

## 2021-12-11 DIAGNOSIS — Z881 Allergy status to other antibiotic agents status: Secondary | ICD-10-CM | POA: Insufficient documentation

## 2021-12-11 DIAGNOSIS — D500 Iron deficiency anemia secondary to blood loss (chronic): Secondary | ICD-10-CM

## 2021-12-11 DIAGNOSIS — Z882 Allergy status to sulfonamides status: Secondary | ICD-10-CM | POA: Insufficient documentation

## 2021-12-11 DIAGNOSIS — D509 Iron deficiency anemia, unspecified: Secondary | ICD-10-CM | POA: Diagnosis not present

## 2021-12-11 DIAGNOSIS — R0602 Shortness of breath: Secondary | ICD-10-CM | POA: Insufficient documentation

## 2021-12-11 DIAGNOSIS — Z887 Allergy status to serum and vaccine status: Secondary | ICD-10-CM | POA: Insufficient documentation

## 2021-12-11 DIAGNOSIS — Z886 Allergy status to analgesic agent status: Secondary | ICD-10-CM | POA: Diagnosis not present

## 2021-12-11 DIAGNOSIS — Z885 Allergy status to narcotic agent status: Secondary | ICD-10-CM | POA: Insufficient documentation

## 2021-12-11 DIAGNOSIS — Z888 Allergy status to other drugs, medicaments and biological substances status: Secondary | ICD-10-CM | POA: Insufficient documentation

## 2021-12-11 DIAGNOSIS — Z880 Allergy status to penicillin: Secondary | ICD-10-CM | POA: Insufficient documentation

## 2021-12-11 LAB — CBC WITH DIFFERENTIAL (CANCER CENTER ONLY)
Abs Immature Granulocytes: 0.01 10*3/uL (ref 0.00–0.07)
Basophils Absolute: 0.1 10*3/uL (ref 0.0–0.1)
Basophils Relative: 1 %
Eosinophils Absolute: 0.3 10*3/uL (ref 0.0–0.5)
Eosinophils Relative: 6 %
HCT: 35.5 % — ABNORMAL LOW (ref 36.0–46.0)
Hemoglobin: 11.8 g/dL — ABNORMAL LOW (ref 12.0–15.0)
Immature Granulocytes: 0 %
Lymphocytes Relative: 21 %
Lymphs Abs: 1.1 10*3/uL (ref 0.7–4.0)
MCH: 29.8 pg (ref 26.0–34.0)
MCHC: 33.2 g/dL (ref 30.0–36.0)
MCV: 89.6 fL (ref 80.0–100.0)
Monocytes Absolute: 0.6 10*3/uL (ref 0.1–1.0)
Monocytes Relative: 11 %
Neutro Abs: 3.2 10*3/uL (ref 1.7–7.7)
Neutrophils Relative %: 61 %
Platelet Count: 232 10*3/uL (ref 150–400)
RBC: 3.96 MIL/uL (ref 3.87–5.11)
RDW: 12.3 % (ref 11.5–15.5)
WBC Count: 5.3 10*3/uL (ref 4.0–10.5)
nRBC: 0 % (ref 0.0–0.2)

## 2021-12-11 LAB — FERRITIN: Ferritin: 16 ng/mL (ref 11–307)

## 2021-12-11 LAB — RETICULOCYTES
Immature Retic Fract: 7.6 % (ref 2.3–15.9)
RBC.: 3.95 MIL/uL (ref 3.87–5.11)
Retic Count, Absolute: 67.9 10*3/uL (ref 19.0–186.0)
Retic Ct Pct: 1.7 % (ref 0.4–3.1)

## 2021-12-11 NOTE — Progress Notes (Signed)
Hematology and Oncology Follow Up Visit  Angela Gibbs [id number] 17-Mar-1940 81 y.o. 12/11/2021   Principle Diagnosis:   Iron deficiency anemia   Current Therapy:        IV iron as indicated    Interim History:  Angela Gibbs is here today for follow-up. She is doing well and denies fatigue.  She notes her same mild SOB with over exertion unchanged from baseline. She is followed by pulmonology.  No fever, chills, n/v, cough, rash, dizziness, SOB, chest pain, palpitations, abdominal pain or changes in bowel or bladder habits.  No blood loss noted. No bruising or petechiae.  No swelling, tenderness, numbness or tingling in her extremities.  No falls or syncope reported.  Appetite and hydration are good. Weight is stable at 182 lbs.   ECOG Performance Status: 1 - Symptomatic but completely ambulatory  Medications:  Allergies as of 12/11/2021       Reactions   Cephalosporins Shortness Of Breath   Breathing difficulty   Ciprofloxacin Hives, Shortness Of Breath   Influenza Virus Vaccine Other (See Comments)   Numbness in legs   Keflex [cephalexin] Hives   Latex Hives, Itching, Rash   Levaquin [levofloxacin] Hives, Palpitations, Other (See Comments)   Chest pain   Nitrofuran Derivatives Hives, Itching, Swelling   Penicillins Hives   Sulfacetamide Sodium Hives   Xopenex [levalbuterol Hcl] Anxiety, Other (See Comments)   Tremors with violent body chattering and shaking   Zostavax [zoster Vaccine Live] Other (See Comments)   Pt has been told by specialists not to receive vaccine d/t h/o herpes in bloodstream and shingles.   Erythromycin Other (See Comments)   "stomach issues"   Hydrochlorothiazide Other (See Comments)   Migraine   Lisinopril Cough   Petrolatum Nausea Only   Pregabalin Other (See Comments)   Ropinirole Hcl Nausea And Vomiting   Shellfish Allergy Hives   Sulfa Antibiotics Hives   Aspirin Other (See Comments)   Stomach pain/burning   Avelox [moxifloxacin Hcl  In Nacl] Nausea Only   Cefdinir Nausea And Vomiting   Chromium Other (See Comments)   Blisters   Eggs Or Egg-derived Products    Per allergy testing, no known reaction. Pt states she was told she could probably eat 2-3 eggs weekly w/o issue.   Iodinated Contrast Media Other (See Comments)   unknown   Lactose Intolerance (gi) Other (See Comments)   "stomach issues"   Percodan [oxycodone-aspirin] Nausea And Vomiting   Prednisone Palpitations   No injections, can do the pills   Troleandomycin Other (See Comments)   Unknown reaction        Medication List        Accurate as of December 11, 2021  1:40 PM. If you have any questions, ask your nurse or doctor.          ALPRAZolam 0.25 MG tablet Commonly known as: XANAX Take 1 tablet (0.25 mg total) by mouth 2 (two) times daily as needed for anxiety.   B-12 Compliance Injection 1000 MCG/ML Kit Generic drug: Cyanocobalamin Inject as directed.   butalbital-acetaminophen-caffeine 50-325-40 MG tablet Commonly known as: FIORICET Take 2 tablets by mouth 2 (two) times daily as needed for headache.   Cholecalciferol 125 MCG (5000 UT) Tabs daily at 0600.   clindamycin 150 MG capsule Commonly known as: CLEOCIN 450 mg 3 (three) times daily. 1 hour prior to dental procedures and 6 hours post dental procedure   cyclobenzaprine 5 MG tablet Commonly known as: FLEXERIL Take 5 mg  by mouth 2 (two) times daily as needed.   diclofenac Sodium 1 % Gel Commonly known as: VOLTAREN APPLY 2 GRAMS EXTERNALLY TO THE AFFECTED AREA FOUR TIMES DAILY   FLUoxetine 20 MG tablet Commonly known as: PROZAC Take 1 tablet by mouth daily   fluticasone 50 MCG/ACT nasal spray Commonly known as: FLONASE Place 2 sprays into both nostrils daily.   furosemide 20 MG tablet Commonly known as: LASIX Take 1 tablet (20 mg total) by mouth daily.   HYDROcodone-acetaminophen 5-325 MG tablet Commonly known as: NORCO/VICODIN Take 1 tablet by mouth every 6 (six)  hours as needed.   lidocaine 4 % Apply 1 patch topically daily. Apply for 12 hours, then removed for 12 hours.   MAGNESIUM GLYCINATE PO Take 50 mg by mouth daily.   nebivolol 10 MG tablet Commonly known as: BYSTOLIC Take 1 tablet (10 mg total) by mouth daily.   omeprazole 20 MG tablet Commonly known as: PRILOSEC OTC Take 20 mg by mouth daily.   Prednicarbate 0.1 % Crea as needed.   promethazine 25 MG tablet Commonly known as: PHENERGAN Take 1 tablet (25 mg total) by mouth every 6 (six) hours as needed for nausea or vomiting.   sodium chloride 0.65 % nasal spray Commonly known as: V-R NASAL SPRAY SALINE Place 1 spray into the nose as needed. Use in both nostrils twice daily   spironolactone 25 MG tablet Commonly known as: ALDACTONE Take 0.5 tablets (12.5 mg total) by mouth daily.   triamcinolone cream 0.1 % Commonly known as: KENALOG Apply topically 2 (two) times daily. As needed   valsartan 320 MG tablet Commonly known as: DIOVAN Take 1 tablet (320 mg total) by mouth daily.   vitamin C 100 MG tablet Take 1,000 mg by mouth daily. Reported on 05/24/2015        Allergies:  Allergies  Allergen Reactions   Cephalosporins Shortness Of Breath    Breathing difficulty   Ciprofloxacin Hives and Shortness Of Breath   Influenza Virus Vaccine Other (See Comments)    Numbness in legs   Keflex [Cephalexin] Hives   Latex Hives, Itching and Rash   Levaquin [Levofloxacin] Hives, Palpitations and Other (See Comments)    Chest pain   Nitrofuran Derivatives Hives, Itching and Swelling   Penicillins Hives   Sulfacetamide Sodium Hives   Xopenex [Levalbuterol Hcl] Anxiety and Other (See Comments)    Tremors with violent body chattering and shaking   Zostavax [Zoster Vaccine Live] Other (See Comments)    Pt has been told by specialists not to receive vaccine d/t h/o herpes in bloodstream and shingles.   Erythromycin Other (See Comments)    "stomach issues"    Hydrochlorothiazide Other (See Comments)    Migraine   Lisinopril Cough   Petrolatum Nausea Only   Pregabalin Other (See Comments)   Ropinirole Hcl Nausea And Vomiting   Shellfish Allergy Hives   Sulfa Antibiotics Hives   Aspirin Other (See Comments)    Stomach pain/burning   Avelox [Moxifloxacin Hcl In Nacl] Nausea Only   Cefdinir Nausea And Vomiting   Chromium Other (See Comments)    Blisters   Eggs Or Egg-Derived Products     Per allergy testing, no known reaction. Pt states she was told she could probably eat 2-3 eggs weekly w/o issue.   Iodinated Contrast Media Other (See Comments)    unknown    Lactose Intolerance (Gi) Other (See Comments)    "stomach issues"   Percodan [Oxycodone-Aspirin] Nausea And Vomiting  Prednisone Palpitations    No injections, can do the pills   Troleandomycin Other (See Comments)    Unknown reaction    Past Medical History, Surgical history, Social history, and Family History were reviewed and updated.  Review of Systems: All other 10 point review of systems is negative.   Physical Exam:  weight is 182 lb 1.9 oz (82.6 kg). Her oral temperature is 97.8 F (36.6 C). Her blood pressure is 155/59 (abnormal) and her pulse is 55 (abnormal). Her respiration is 18 and oxygen saturation is 98%.   Wt Readings from Last 3 Encounters:  12/11/21 182 lb 1.9 oz (82.6 kg)  10/28/21 184 lb (83.5 kg)  09/23/21 182 lb 12.8 oz (82.9 kg)    Ocular: Sclerae unicteric, pupils equal, round and reactive to light Ear-nose-throat: Oropharynx clear, dentition fair Lymphatic: No cervical or supraclavicular adenopathy Lungs no rales or rhonchi, good excursion bilaterally Heart regular rate and rhythm, no murmur appreciated Abd soft, nontender, positive bowel sounds MSK no focal spinal tenderness, no joint edema Neuro: non-focal, well-oriented, appropriate affect Breasts: Deferred   Lab Results  Component Value Date   WBC 5.3 12/11/2021   HGB 11.8 (L)  12/11/2021   HCT 35.5 (L) 12/11/2021   MCV 89.6 12/11/2021   PLT 232 12/11/2021   Lab Results  Component Value Date   FERRITIN 63 09/10/2021   IRON 62 09/10/2021   TIBC 354 09/10/2021   UIBC 292 09/10/2021   IRONPCTSAT 18 09/10/2021   Lab Results  Component Value Date   RETICCTPCT 1.7 12/11/2021   RBC 3.96 12/11/2021   RBC 3.95 12/11/2021   No results found for: "KPAFRELGTCHN", "LAMBDASER", "KAPLAMBRATIO" No results found for: "IGGSERUM", "IGA", "IGMSERUM" No results found for: "TOTALPROTELP", "ALBUMINELP", "A1GS", "A2GS", "BETS", "BETA2SER", "GAMS", "MSPIKE", "SPEI"   Chemistry      Component Value Date/Time   NA 133 (L) 09/23/2021 1425   NA 134 (A) 11/18/2019 0000   K 4.6 09/23/2021 1425   CL 97 09/23/2021 1425   CO2 28 09/23/2021 1425   BUN 14 09/23/2021 1425   BUN 17 11/18/2019 0000   CREATININE 0.63 09/23/2021 1425   CREATININE 0.70 05/01/2021 1430   CREATININE 0.76 12/07/2019 1413   GLU 80 11/18/2019 0000      Component Value Date/Time   CALCIUM 9.4 09/23/2021 1425   ALKPHOS 78 05/01/2021 1430   AST 18 05/01/2021 1430   ALT 16 05/01/2021 1430   BILITOT 0.4 05/01/2021 1430       Impression and Plan: Angela Gibbs is a very pleasant 81 yo caucasian female with history of iron deficiency anemia.  Iron studies pending.  Lab only in 3 months and follow-up in 6 months.   Lottie Dawson, NP 10/25/20231:40 PM

## 2021-12-12 ENCOUNTER — Telehealth: Payer: Self-pay | Admitting: *Deleted

## 2021-12-12 LAB — IRON AND IRON BINDING CAPACITY (CC-WL,HP ONLY)
Iron: 61 ug/dL (ref 28–170)
Saturation Ratios: 13 % (ref 10.4–31.8)
TIBC: 462 ug/dL — ABNORMAL HIGH (ref 250–450)
UIBC: 401 ug/dL (ref 148–442)

## 2021-12-12 NOTE — Telephone Encounter (Signed)
Per 12/11/21 los - called and gave upcoming appointments - confirmed

## 2021-12-13 ENCOUNTER — Other Ambulatory Visit: Payer: Self-pay

## 2021-12-13 DIAGNOSIS — T7840XA Allergy, unspecified, initial encounter: Secondary | ICD-10-CM | POA: Insufficient documentation

## 2021-12-13 DIAGNOSIS — G43909 Migraine, unspecified, not intractable, without status migrainosus: Secondary | ICD-10-CM | POA: Insufficient documentation

## 2021-12-13 DIAGNOSIS — S060XAA Concussion with loss of consciousness status unknown, initial encounter: Secondary | ICD-10-CM | POA: Insufficient documentation

## 2021-12-13 DIAGNOSIS — W19XXXA Unspecified fall, initial encounter: Secondary | ICD-10-CM | POA: Insufficient documentation

## 2021-12-13 DIAGNOSIS — B029 Zoster without complications: Secondary | ICD-10-CM | POA: Insufficient documentation

## 2021-12-13 DIAGNOSIS — T148XXA Other injury of unspecified body region, initial encounter: Secondary | ICD-10-CM | POA: Insufficient documentation

## 2021-12-13 DIAGNOSIS — B009 Herpesviral infection, unspecified: Secondary | ICD-10-CM | POA: Insufficient documentation

## 2021-12-13 DIAGNOSIS — Z6831 Body mass index (BMI) 31.0-31.9, adult: Secondary | ICD-10-CM | POA: Insufficient documentation

## 2021-12-13 DIAGNOSIS — S0990XA Unspecified injury of head, initial encounter: Secondary | ICD-10-CM | POA: Insufficient documentation

## 2021-12-13 HISTORY — DX: Body mass index (BMI) 31.0-31.9, adult: Z68.31

## 2021-12-17 ENCOUNTER — Encounter: Payer: Self-pay | Admitting: Family Medicine

## 2021-12-17 DIAGNOSIS — I10 Essential (primary) hypertension: Secondary | ICD-10-CM

## 2021-12-18 MED ORDER — VALSARTAN 320 MG PO TABS: 320.0000 mg | ORAL_TABLET | Freq: Every day | ORAL | 3 refills | Status: DC

## 2021-12-25 ENCOUNTER — Encounter: Payer: Self-pay | Admitting: Cardiology

## 2021-12-25 ENCOUNTER — Ambulatory Visit: Payer: Medicare Other | Attending: Cardiology | Admitting: Cardiology

## 2021-12-25 VITALS — BP 160/78 | HR 59 | Ht 63.0 in | Wt 182.0 lb

## 2021-12-25 DIAGNOSIS — E669 Obesity, unspecified: Secondary | ICD-10-CM | POA: Diagnosis not present

## 2021-12-25 DIAGNOSIS — R0609 Other forms of dyspnea: Secondary | ICD-10-CM | POA: Diagnosis not present

## 2021-12-25 DIAGNOSIS — E66811 Obesity, class 1: Secondary | ICD-10-CM

## 2021-12-25 DIAGNOSIS — R072 Precordial pain: Secondary | ICD-10-CM

## 2021-12-25 DIAGNOSIS — I10 Essential (primary) hypertension: Secondary | ICD-10-CM | POA: Diagnosis not present

## 2021-12-25 HISTORY — DX: Obesity, class 1: E66.811

## 2021-12-25 HISTORY — DX: Other forms of dyspnea: R06.09

## 2021-12-25 MED ORDER — PREDNISONE 50 MG PO TABS: ORAL_TABLET | ORAL | 0 refills | Status: DC

## 2021-12-25 NOTE — Patient Instructions (Signed)
Medication Instructions:  Your physician recommends that you continue on your current medications as directed. Please refer to the Current Medication list given to you today.   *If you need a refill on your cardiac medications before your next appointment, please call your pharmacy*   Lab Work: Your physician recommends that you have labs done 1 week before your CT scan.  [address] in [place]. They also close daily for lunch for 12-1.  or [place] [address] 2nd floor M-W 8-11:30 and 1-4:30 and Thursday and Friday 8-11:30.   If you have labs (blood work) drawn today and your tests are completely normal, you will receive your results only by: [place] (if you have MyChart) OR A paper copy in the mail If you have any lab test that is abnormal or we need to change your treatment, we will call you to review the results.   Testing/Procedures:   Your cardiac CT will be scheduled at one of the below locations:   [facility] [address] [place], [place] [address] [phone]   At [facility], please arrive at the [facility]'s Entrance (Entrance C2) of [facility] 30 minutes prior to test start time. You can use the FREE valet parking offered at entrance C (encouraged to control the heart rate for the test)  Proceed to the [facility] Radiology Department (first floor) to check-in and test prep.  All radiology patients and guests should use entrance C2 at [facility], accessed from [facility], even though the hospital's physical address listed is [address].      Please follow these instructions carefully (unless otherwise directed):   On the Night Before the Test: Be sure to Drink plenty of water. Do not consume any caffeinated/decaffeinated beverages or chocolate 12 hours prior to your test. Do not take any antihistamines 12 hours prior to your test. If the  patient has contrast allergy: Patient will need a prescription for Prednisone and very clear instructions (as follows): Prednisone 50 mg - take 13 hours prior to test Take another Prednisone 50 mg 7 hours prior to test Take another Prednisone 50 mg 1 hour prior to test Take Benadryl 50 mg 1 hour prior to test Patient must complete all four doses of above prophylactic medications. Patient will need a ride after test due to Benadryl.  On the Day of the Test: Drink plenty of water until 1 hour prior to the test. Do not eat any food 4 hours prior to the test. You may take your regular medications prior to the test.  Take nebivolol (Bystolic) two hours prior to test. HOLD Furosemide morning of the test. FEMALES- please wear underwire-free bra if available, avoid dresses & tight clothing      After the Test: Drink plenty of water. After receiving IV contrast, you may experience a mild flushed feeling. This is normal. On occasion, you may experience a mild rash up to 24 hours after the test. This is not dangerous. If this occurs, you can take Benadryl 25 mg and increase your fluid intake. If you experience trouble breathing, this can be serious. If it is severe call 911 IMMEDIATELY. If it is mild, please call our office. If you take any of these medications: Glipizide/Metformin, Avandament, Glucavance, please do not take 48 hours after completing test unless otherwise instructed.  We will call to schedule your test 2-4 weeks out understanding that some insurance  companies will need an authorization prior to the service being performed.   For non-scheduling related questions, please contact the cardiac imaging nurse navigator should you have any questions/concerns: Marchia Bond, Cardiac Imaging Nurse Navigator Gordy Clement, Cardiac Imaging Nurse Navigator [facility] Direct Office Dial: [phone]   For scheduling needs, including cancellations and rescheduling,  please call [place], [phone].    Follow-Up: At [facility], you and your health needs are our priority.  As part of our continuing mission to provide you with exceptional heart care, we have created designated Provider Care Teams.  These Care Teams include your primary Cardiologist (physician) and Advanced Practice Providers (APPs -  Physician Assistants and Nurse Practitioners) who all work together to provide you with the care you need, when you need it.  We recommend signing up for the patient portal called "MyChart".  Sign up information is provided on this After Visit Summary.  MyChart is used to connect with patients for Virtual Visits (Telemedicine).  Patients are able to view lab/test results, encounter notes, upcoming appointments, etc.  Non-urgent messages can be sent to your provider as well.   To learn more about what you can do with MyChart, go to [email or web address].    Your next appointment:   9 month(s)  The format for your next appointment:   In Person  Provider:   Jyl Heinz, MD   Other Instructions Cardiac CT Angiogram A cardiac CT angiogram is a procedure to look at the heart and the area around the heart. It may be done to help find the cause of chest pains or other symptoms of heart disease. During this procedure, a substance called contrast dye is injected into the blood vessels in the area to be checked. A large X-ray machine, called a CT scanner, then takes detailed pictures of the heart and the surrounding area. The procedure is also sometimes called a coronary CT angiogram, coronary artery scanning, or CTA. A cardiac CT angiogram allows the health care provider to see how well blood is flowing to and from the heart. The health care provider will be able to see if there are any problems, such as: Blockage or narrowing of the coronary arteries in the heart. Fluid around the heart. Signs of weakness or disease in the muscles, valves, and tissues of  the heart. Tell a health care provider about: Any allergies you have. This is especially important if you have had a previous allergic reaction to contrast dye. All medicines you are taking, including vitamins, herbs, eye drops, creams, and over-the-counter medicines. Any blood disorders you have. Any surgeries you have had. Any medical conditions you have. Whether you are pregnant or may be pregnant. Any anxiety disorders, chronic pain, or other conditions you have that may increase your stress or prevent you from lying still. What are the risks? Generally, this is a safe procedure. However, problems may occur, including: Bleeding. Infection. Allergic reactions to medicines or dyes. Damage to other structures or organs. Kidney damage from the contrast dye that is used. Increased risk of cancer from radiation exposure. This risk is low. Talk with your health care provider about: The risks and benefits of testing. How you can receive the lowest dose of radiation. What happens before the procedure? Wear comfortable clothing and remove any jewelry, glasses, dentures, and hearing aids. Follow instructions from your health care provider about eating and drinking. This may include: For 12 hours before the procedure -- avoid caffeine. This includes tea, coffee,  soda, energy drinks, and diet pills. Drink plenty of water or other fluids that do not have caffeine in them. Being well hydrated can prevent complications. For 4-6 hours before the procedure -- stop eating and drinking. The contrast dye can cause nausea, but this is less likely if your stomach is empty. Ask your health care provider about changing or stopping your regular medicines. This is especially important if you are taking diabetes medicines, blood thinners, or medicines to treat problems with erections (erectile dysfunction). What happens during the procedure?  Hair on your chest may need to be removed so that small sticky patches  called electrodes can be placed on your chest. These will transmit information that helps to monitor your heart during the procedure. An IV will be inserted into one of your veins. You might be given a medicine to control your heart rate during the procedure. This will help to ensure that good images are obtained. You will be asked to lie on an exam table. This table will slide in and out of the CT machine during the procedure. Contrast dye will be injected into the IV. You might feel warm, or you may get a metallic taste in your mouth. You will be given a medicine called nitroglycerin. This will relax or dilate the arteries in your heart. The table that you are lying on will move into the CT machine tunnel for the scan. The person running the machine will give you instructions while the scans are being done. You may be asked to: Keep your arms above your head. Hold your breath. Stay very still, even if the table is moving. When the scanning is complete, you will be moved out of the machine. The IV will be removed. The procedure may vary among health care providers and hospitals. What can I expect after the procedure? After your procedure, it is common to have: A metallic taste in your mouth from the contrast dye. A feeling of warmth. A headache from the nitroglycerin. Follow these instructions at home: Take over-the-counter and prescription medicines only as told by your health care provider. If you are told, drink enough fluid to keep your urine pale yellow. This will help to flush the contrast dye out of your body. Most people can return to their normal activities right after the procedure. Ask your health care provider what activities are safe for you. It is up to you to get the results of your procedure. Ask your health care provider, or the department that is doing the procedure, when your results will be ready. Keep all follow-up visits as told by your health care provider. This is  important. Contact a health care provider if: You have any symptoms of allergy to the contrast dye. These include: Shortness of breath. Rash or hives. A racing heartbeat. Summary A cardiac CT angiogram is a procedure to look at the heart and the area around the heart. It may be done to help find the cause of chest pains or other symptoms of heart disease. During this procedure, a large X-ray machine, called a CT scanner, takes detailed pictures of the heart and the surrounding area after a contrast dye has been injected into blood vessels in the area. Ask your health care provider about changing or stopping your regular medicines before the procedure. This is especially important if you are taking diabetes medicines, blood thinners, or medicines to treat erectile dysfunction. If you are told, drink enough fluid to keep your urine pale yellow. This  will help to flush the contrast dye out of your body. This information is not intended to replace advice given to you by your health care provider. Make sure you discuss any questions you have with your health care provider. Document Revised: 09/29/2018 Document Reviewed: 09/29/2018 Elsevier Patient Education  [place].

## 2021-12-25 NOTE — Progress Notes (Signed)
Cardiology Office Note:    Date:  12/25/2021   ID:  [name], DOB 1940-04-26, MRN [id number]  PCP:  Angela Mclean, MD  Cardiologist:  Angela Lindau, MD   Referring MD: Angela Mclean, MD    ASSESSMENT:    1. Essential hypertension   2. Dyspnea on exertion   3. Obesity (BMI 30.0-34.9)    PLAN:    In order of problems listed above:  Primary prevention stressed with the patient.  Importance of compliance with diet medication stressed and she vocalized understanding. Dyspnea on exertion: This is concerning and her symptoms are worsening gradually.  In view of risk factors we will do a CT FFR.  I discussed with the multiple modalities and she prefer this one.  This will help [place] assess for any coronary artery disease. Essential hypertension: Blood pressure stable and diet is emphasized.  She checks her blood pressure at home and she tells me that it was fine.  Diet was emphasized.  Lifestyle modification urged. Mixed dyslipidemia and obesity: Diet was emphasized and I discussed this extensively with her and she promises to do better.  Risks of obesity explained.  She plans to comply. Patient will be seen in follow-up appointment in 6 months or earlier if the patient has any concerns    Medication Adjustments/Labs and Tests Ordered: Current medicines are reviewed at length with the patient today.  Concerns regarding medicines are outlined above.  No orders of the defined types were placed in this encounter.  No orders of the defined types were placed in this encounter.    History of Present Illness:    Angela Gibbs is a 81 y.o. female who is being seen today for the evaluation of dyspnea on exertion at the request of Angela Gibbs, Angela Filler, MD. patient is a pleasant 81 year old female.  She has past medical history of essential hypertension, mild dyslipidemia, obesity.  She mentions to me that she is noticing increasing shortness of breath on exertion and this is  concerning to her.  She tries to exercise at the exercise program at [facility].  No orthopnea or PND.  No tightness in the chest or chest pain.  At the time of my evaluation, the patient is alert awake oriented and in no distress.  Past Medical History:  Diagnosis Date   Abdominal pain, acute, right upper quadrant 07/29/2013   Allergy    Body mass index (BMI) 31.0-31.9, adult 12/13/2021   Concussion    Current use of beta blocker 04/11/2016   Essential hypertension 02/20/2016   Falls    Gastroesophageal reflux disease without esophagitis 04/11/2016   Head injury    Herpes    IDA (iron deficiency anemia) 01/11/2020   Iron deficiency anemia due to chronic blood loss 12/28/2019   Migraines    Moderate persistent asthma without complication 2/54/2706   Nerve damage    Osteoarthritis 12/07/2019   Other allergic rhinitis 04/11/2016   Positive FIT (fecal immunochemical test) 12/28/2019   Psoriasis 12/07/2019   Psoriatic arthritis ([place]) 12/07/2019   Managed by rheumatology   Restrictive lung disease 05/13/2016   Related to scoliosis and hiatal hernia   Shingles    TIA (transient ischemic attack) 05/18/2019    Past Surgical History:  Procedure Laterality Date   BRAIN SURGERY     CRANIOTOMY FOR EXTRACRANIAL-INTRACRANIAL BYPASS     EYE SURGERY  4.4.17   milia excision   HIP SURGERY     intracranial surgery  NERVE SURGERY     ROOT CANAL     TONSILLECTOMY      Current Medications: Current Meds  Medication Sig   ALPRAZolam (XANAX) 0.25 MG tablet Take 1 tablet (0.25 mg total) by mouth 2 (two) times daily as needed for anxiety.   Ascorbic Acid (VITAMIN C) 1000 MG tablet Take 1,000 mg by mouth daily.   Cholecalciferol 125 MCG (5000 UT) TABS Take 5,000 Units by mouth daily.   clindamycin (CLEOCIN) 150 MG capsule Take 450 mg by mouth 3 (three) times daily. 1 hour prior to dental procedures and 6 hours post dental procedure   Cyanocobalamin (B-12 COMPLIANCE INJECTION) 1000 MCG/ML KIT  Inject 1,000 mcg as directed every 30 (thirty) days.   cyclobenzaprine (FLEXERIL) 5 MG tablet Take 5 mg by mouth 2 (two) times daily as needed for muscle spasms.   diclofenac Sodium (VOLTAREN) 1 % GEL APPLY 2 GRAMS EXTERNALLY TO THE AFFECTED AREA FOUR TIMES DAILY   FLUoxetine (PROZAC) 20 MG tablet Take 1 tablet by mouth daily   fluticasone (FLONASE) 50 MCG/ACT nasal spray Place 2 sprays into both nostrils daily.   furosemide (LASIX) 20 MG tablet Take 1 tablet (20 mg total) by mouth daily. (Patient taking differently: Take 20 mg by mouth every other day.)   HYDROcodone-acetaminophen (NORCO/VICODIN) 5-325 MG tablet Take 1 tablet by mouth every 6 (six) hours as needed for pain.   MAGNESIUM GLYCINATE PO Take 50 mg by mouth daily.   nebivolol (BYSTOLIC) 10 MG tablet Take 1 tablet (10 mg total) by mouth daily.   omeprazole (PRILOSEC OTC) 20 MG tablet Take 20 mg by mouth daily.   Prednicarbate 0.1 % CREA Apply 1 Application topically as needed for itching.   promethazine (PHENERGAN) 25 MG tablet Take 1 tablet (25 mg total) by mouth every 6 (six) hours as needed for nausea or vomiting.   sodium chloride (V-R NASAL SPRAY SALINE) 0.65 % nasal spray Place 1 spray into the nose as needed. Use in both nostrils twice daily   spironolactone (ALDACTONE) 25 MG tablet Take 0.5 tablets (12.5 mg total) by mouth daily.   thyroid (ARMOUR) 30 MG tablet Take 30 mg by mouth daily.   valsartan (DIOVAN) 320 MG tablet Take 1 tablet (320 mg total) by mouth daily.     Allergies:   Cephalosporins, Ciprofloxacin, Influenza virus vaccine, Keflex [cephalexin], Latex, Levaquin [levofloxacin], Nitrofuran derivatives, Penicillins, Sulfacetamide sodium, Xopenex [levalbuterol hcl], Zostavax [zoster vaccine live], Erythromycin, Hydrochlorothiazide, Lisinopril, Petrolatum, Pregabalin, Ropinirole hcl, Shellfish allergy, Sulfa antibiotics, Golimumab, Ibuprofen, Aspirin, Avelox [moxifloxacin hcl in nacl], Cefdinir, Chromium, Eggs or  egg-derived products, Iodinated contrast media, Lactose intolerance (gi), Percodan [oxycodone-aspirin], Prednisone, and Troleandomycin   Social History   Socioeconomic History   Marital status: Married    Spouse name: Not on file   Number of children: Not on file   Years of education: Not on file   Highest education level: Not on file  Occupational History   Not on file  Tobacco Use   Smoking status: Never   Smokeless tobacco: Never  Vaping Use   Vaping Use: Never used  Substance and Sexual Activity   Alcohol use: Yes    Comment: social   Drug use: No   Sexual activity: Yes  Other Topics Concern   Not on file  Social History Narrative   Not on file   Social Determinants of Health   Financial Resource Strain: Low Risk  (01/25/2021)   Overall Financial Resource Strain (CARDIA)    Difficulty  of Paying Living Expenses: Not hard at all  Food Insecurity: No Food Insecurity (12/27/2020)   Hunger Vital Sign    Worried About Running Out of Food in the Last Year: Never true    Ran Out of Food in the Last Year: Never true  Transportation Needs: No Transportation Needs (12/27/2020)   PRAPARE - Hydrologist (Medical): No    Lack of Transportation (Non-Medical): No  Physical Activity: Sufficiently Active (10/09/2020)   Exercise Vital Sign    Days of Exercise per Week: 3 days    Minutes of Exercise per Session: 60 min  Stress: No Stress Concern Present (11/21/2021)   [place]    Feeling of Stress : Not at all  Social Connections: [place] (11/21/2021)   Social Connection and Isolation Panel [NHANES]    Frequency of Communication with Friends and Family: More than three times a week    Frequency of Social Gatherings with Friends and Family: More than three times a week    Attends Religious Services: More than 4 times per year    Active Member of [employer] or Organizations: Yes     Attends Music therapist: More than 4 times per year    Marital Status: Married     Family History: The patient's family history includes Heart attack in her father; Heart disease in her father.  ROS:   Please see the history of present illness.    All other systems reviewed and are negative.  EKGs/Labs/Other Studies Reviewed:    The following studies were reviewed today: EKG reveals sinus rhythm and nonspecific ST-T changes   Recent Labs: 05/01/2021: ALT 16 09/23/2021: BUN 14; Creatinine, Ser 0.63; Potassium 4.6; Sodium 133 12/11/2021: Hemoglobin 11.8; Platelet Count 232  Recent Lipid Panel    Component Value Date/Time   CHOL 211 (H) 09/23/2021 1425   TRIG 86.0 09/23/2021 1425   HDL 69.60 09/23/2021 1425   CHOLHDL 3 09/23/2021 1425   VLDL 17.2 09/23/2021 1425   LDLCALC 124 (H) 09/23/2021 1425    Physical Exam:    VS:  BP (!) 160/78   Pulse (!) 59   Ht _0  (1.6 m)   Wt 182 lb (82.6 kg)   SpO2 96%   BMI 32.24 kg/m     Wt Readings from Last 3 Encounters:  12/25/21 182 lb (82.6 kg)  12/11/21 182 lb 1.9 oz (82.6 kg)  10/28/21 184 lb (83.5 kg)     GEN: Patient is in no acute distress HEENT: Normal NECK: No JVD; No carotid bruits LYMPHATICS: No lymphadenopathy CARDIAC: S1 S2 regular, 2/6 systolic murmur at the apex. RESPIRATORY:  Clear to auscultation without rales, wheezing or rhonchi  ABDOMEN: Soft, non-tender, non-distended MUSCULOSKELETAL:  No edema; No deformity  SKIN: Warm and dry NEUROLOGIC:  Alert and oriented x 3 PSYCHIATRIC:  Normal affect    Signed, Angela Lindau, MD  12/25/2021 4:15 PM    [facility]

## 2022-01-02 ENCOUNTER — Telehealth: Payer: Self-pay | Admitting: Cardiology

## 2022-01-02 NOTE — Telephone Encounter (Signed)
Pt informed that the order has been released and ready for her to have it drawn.

## 2022-01-02 NOTE — Telephone Encounter (Signed)
Patient is going to have her lab work done at [facility] in [place], she called [facility] and they told her the lab orders need to be released so they can do the labs for her.

## 2022-01-03 DIAGNOSIS — R0609 Other forms of dyspnea: Secondary | ICD-10-CM | POA: Diagnosis not present

## 2022-01-04 LAB — BASIC METABOLIC PANEL
BUN/Creatinine Ratio: 20 (ref 12–28)
BUN: 14 mg/dL (ref 8–27)
CO2: 22 mmol/L (ref 20–29)
Calcium: 9.2 mg/dL (ref 8.7–10.3)
Chloride: 100 mmol/L (ref 96–106)
Creatinine, Ser: 0.7 mg/dL (ref 0.57–1.00)
Glucose: 93 mg/dL (ref 70–99)
Potassium: 4.4 mmol/L (ref 3.5–5.2)
Sodium: 136 mmol/L (ref 134–144)
eGFR: 87 mL/min/{1.73_m2} (ref >59)

## 2022-01-06 ENCOUNTER — Telehealth: Payer: Self-pay

## 2022-01-06 NOTE — Telephone Encounter (Signed)
-----   Message from Melony Overly sent at 01/06/2022 11:36 AM EST ----- Regarding: update Hey,  Mrs. Bensinger called and cancel her ct scan today. I asked if she would like to reschedule. She refused to. she want to make an appointment and speak to Dr. Geraldo Pitter about it first.   Thanks, [place]

## 2022-01-06 NOTE — Telephone Encounter (Signed)
Spoke with pt who states that she is waiting to wait to have her cardiac CT until after Thanksgiving.

## 2022-01-07 ENCOUNTER — Telehealth (HOSPITAL_COMMUNITY): Payer: Self-pay | Admitting: Emergency Medicine

## 2022-01-07 NOTE — Telephone Encounter (Signed)
Pt states "too much going on right now to have test done" Angela Bond RN Navigator Cardiac Imaging [facility] [phone] Office  [phone] Cell

## 2022-01-08 ENCOUNTER — Ambulatory Visit (HOSPITAL_COMMUNITY): Payer: Medicare Other

## 2022-01-16 ENCOUNTER — Telehealth: Payer: Self-pay | Admitting: *Deleted

## 2022-01-16 NOTE — Telephone Encounter (Signed)
Message received from patient stating that she has been feeling tired and would like to come in to have her iron studies checked.  Message sent to scheduling.

## 2022-01-24 ENCOUNTER — Inpatient Hospital Stay: Payer: Medicare Other | Attending: Hematology & Oncology

## 2022-01-24 DIAGNOSIS — D509 Iron deficiency anemia, unspecified: Secondary | ICD-10-CM | POA: Insufficient documentation

## 2022-01-24 DIAGNOSIS — D500 Iron deficiency anemia secondary to blood loss (chronic): Secondary | ICD-10-CM

## 2022-01-24 DIAGNOSIS — Z79899 Other long term (current) drug therapy: Secondary | ICD-10-CM | POA: Insufficient documentation

## 2022-01-24 LAB — CBC WITH DIFFERENTIAL (CANCER CENTER ONLY)
Abs Immature Granulocytes: 0.07 10*3/uL (ref 0.00–0.07)
Basophils Absolute: 0 10*3/uL (ref 0.0–0.1)
Basophils Relative: 1 %
Eosinophils Absolute: 0.4 10*3/uL (ref 0.0–0.5)
Eosinophils Relative: 7 %
HCT: 28.2 % — ABNORMAL LOW (ref 36.0–46.0)
Hemoglobin: 8.8 g/dL — ABNORMAL LOW (ref 12.0–15.0)
Immature Granulocytes: 1 %
Lymphocytes Relative: 20 %
Lymphs Abs: 1 10*3/uL (ref 0.7–4.0)
MCH: 27.7 pg (ref 26.0–34.0)
MCHC: 31.2 g/dL (ref 30.0–36.0)
MCV: 88.7 fL (ref 80.0–100.0)
Monocytes Absolute: 0.7 10*3/uL (ref 0.1–1.0)
Monocytes Relative: 13 %
Neutro Abs: 3 10*3/uL (ref 1.7–7.7)
Neutrophils Relative %: 58 %
Platelet Count: 244 10*3/uL (ref 150–400)
RBC: 3.18 MIL/uL — ABNORMAL LOW (ref 3.87–5.11)
RDW: 12.3 % (ref 11.5–15.5)
WBC Count: 5.2 10*3/uL (ref 4.0–10.5)
nRBC: 0 % (ref 0.0–0.2)

## 2022-01-24 LAB — FERRITIN: Ferritin: 9 ng/mL — ABNORMAL LOW (ref 11–307)

## 2022-01-24 LAB — IRON AND IRON BINDING CAPACITY (CC-WL,HP ONLY)
Iron: 8 ug/dL — ABNORMAL LOW (ref 28–170)
Saturation Ratios: 2 % — ABNORMAL LOW (ref 10.4–31.8)
TIBC: 487 ug/dL — ABNORMAL HIGH (ref 250–450)
UIBC: 479 ug/dL — ABNORMAL HIGH (ref 148–442)

## 2022-01-24 LAB — RETICULOCYTES
Immature Retic Fract: 15.4 % (ref 2.3–15.9)
RBC.: 3.14 MIL/uL — ABNORMAL LOW (ref 3.87–5.11)
Retic Count, Absolute: 75.7 10*3/uL (ref 19.0–186.0)
Retic Ct Pct: 2.4 % (ref 0.4–3.1)

## 2022-01-27 ENCOUNTER — Encounter: Payer: Self-pay | Admitting: Family

## 2022-01-29 ENCOUNTER — Inpatient Hospital Stay: Payer: Medicare Other | Admitting: Licensed Clinical Social Worker

## 2022-01-29 ENCOUNTER — Inpatient Hospital Stay: Payer: Medicare Other

## 2022-01-29 VITALS — BP 133/48 | HR 57 | Temp 98.2°F | Resp 18

## 2022-01-29 DIAGNOSIS — D509 Iron deficiency anemia, unspecified: Secondary | ICD-10-CM | POA: Diagnosis not present

## 2022-01-29 DIAGNOSIS — D500 Iron deficiency anemia secondary to blood loss (chronic): Secondary | ICD-10-CM

## 2022-01-29 DIAGNOSIS — Z79899 Other long term (current) drug therapy: Secondary | ICD-10-CM | POA: Diagnosis not present

## 2022-01-29 MED ORDER — SODIUM CHLORIDE 0.9 % IV SOLN
510.0000 mg | Freq: Once | INTRAVENOUS | Status: AC
  Administered 2022-01-29: 510 mg via INTRAVENOUS
  Filled 2022-01-29: qty 510

## 2022-01-29 MED ORDER — SODIUM CHLORIDE 0.9 % IV SOLN: Freq: Once | INTRAVENOUS | Status: AC

## 2022-01-29 NOTE — Patient Instructions (Signed)

## 2022-01-29 NOTE — Progress Notes (Signed)
[place] CSW Progress Note  Holiday representative met with patient while doing rounds in infusion.  She was receiving iron and recognized the symptoms when they occurred.  She said she gets very short of breath.  She engaged in conversation regarding friends that have died recently.  CSW provided active listening and supportive counseling.    Rodman Pickle Shanya Ferriss, LCSW

## 2022-02-02 ENCOUNTER — Other Ambulatory Visit: Payer: Self-pay | Admitting: Family Medicine

## 2022-02-05 ENCOUNTER — Inpatient Hospital Stay: Payer: Medicare Other

## 2022-02-05 VITALS — BP 125/68 | HR 55 | Temp 98.0°F | Resp 17

## 2022-02-05 DIAGNOSIS — D500 Iron deficiency anemia secondary to blood loss (chronic): Secondary | ICD-10-CM

## 2022-02-05 DIAGNOSIS — D509 Iron deficiency anemia, unspecified: Secondary | ICD-10-CM | POA: Diagnosis not present

## 2022-02-05 DIAGNOSIS — Z79899 Other long term (current) drug therapy: Secondary | ICD-10-CM | POA: Diagnosis not present

## 2022-02-05 MED ORDER — SODIUM CHLORIDE 0.9 % IV SOLN
510.0000 mg | Freq: Once | INTRAVENOUS | Status: AC
  Administered 2022-02-05: 510 mg via INTRAVENOUS
  Filled 2022-02-05: qty 510

## 2022-02-05 MED ORDER — SODIUM CHLORIDE 0.9 % IV SOLN: Freq: Once | INTRAVENOUS | Status: AC

## 2022-02-05 NOTE — Patient Instructions (Signed)

## 2022-02-20 ENCOUNTER — Encounter (INDEPENDENT_AMBULATORY_CARE_PROVIDER_SITE_OTHER): Payer: Medicare Other | Admitting: Family Medicine

## 2022-02-20 ENCOUNTER — Telehealth: Payer: Self-pay

## 2022-02-20 DIAGNOSIS — R051 Acute cough: Secondary | ICD-10-CM

## 2022-02-20 NOTE — Telephone Encounter (Signed)
Spouse calling saying his wife was a patient of Dr. Shaune Leeks. Spouse saying his wife couldn't get in with her pulmonologist today if we could call in some montelukast. I told the spouse that we haven't seen his wife for almost 6 years. I told the spouse that we could schedule her a new patient appointment. He declined and he will called the pulmonologist tomorrow. End of phone call.

## 2022-02-21 DIAGNOSIS — R051 Acute cough: Secondary | ICD-10-CM

## 2022-02-21 MED ORDER — OSELTAMIVIR PHOSPHATE 75 MG PO CAPS: 75.0000 mg | ORAL_CAPSULE | Freq: Two times a day (BID) | ORAL | 0 refills | Status: DC

## 2022-02-21 MED ORDER — DOXYCYCLINE HYCLATE 100 MG PO TABS: 100.0000 mg | ORAL_TABLET | Freq: Two times a day (BID) | ORAL | 0 refills | Status: DC

## 2022-02-21 MED ORDER — MONTELUKAST SODIUM 10 MG PO TABS: 10.0000 mg | ORAL_TABLET | Freq: Every day | ORAL | 5 refills | Status: DC

## 2022-02-21 NOTE — Telephone Encounter (Signed)
I called patient back.  Unfortunately we have no same-day openings at any [facility] location.  Discussed having her go to an urgent care or the ER.  Right now she does not feel this is necessary, she prefers not to go to one of these care options.  She has tested at home for COVID and been negative.  Discussed likely diagnosis of flu, also consider bacterial infection.  Since we are not able to test her I will cover her for both flu and bacterial bronchitis or pneumonia with Tamiflu and doxycycline  She will monitor her condition carefully, if she is getting worse she will seek further care directly  Please see the MyChart message reply(ies) for my assessment and plan.  The patient gave consent for this Medical Advice Message and is aware that it may result in a bill to their insurance company as well as the possibility that this may result in a co-payment or deductible. They are an established patient, but are not seeking medical advice exclusively about a problem treated during an in person or video visit in the last 7 days. I did not recommend an in person or video visit within 7 days of my reply.  I spent a total of 10 minutes cumulative time within 7 days through [place] Lamar Blinks, MD

## 2022-02-21 NOTE — Addendum Note (Signed)
Addended by: Lamar Blinks C on: 02/21/2022 02:00 PM   Modules accepted: Orders

## 2022-03-03 DIAGNOSIS — M545 Low back pain, unspecified: Secondary | ICD-10-CM | POA: Diagnosis not present

## 2022-03-03 DIAGNOSIS — Z5181 Encounter for therapeutic drug level monitoring: Secondary | ICD-10-CM | POA: Diagnosis not present

## 2022-03-03 DIAGNOSIS — G894 Chronic pain syndrome: Secondary | ICD-10-CM | POA: Diagnosis not present

## 2022-03-03 DIAGNOSIS — M62838 Other muscle spasm: Secondary | ICD-10-CM | POA: Diagnosis not present

## 2022-03-03 DIAGNOSIS — Z79899 Other long term (current) drug therapy: Secondary | ICD-10-CM | POA: Diagnosis not present

## 2022-03-03 DIAGNOSIS — G4489 Other headache syndrome: Secondary | ICD-10-CM | POA: Diagnosis not present

## 2022-03-03 DIAGNOSIS — B0229 Other postherpetic nervous system involvement: Secondary | ICD-10-CM | POA: Diagnosis not present

## 2022-03-13 ENCOUNTER — Inpatient Hospital Stay: Payer: Medicare Other

## 2022-03-17 ENCOUNTER — Other Ambulatory Visit: Payer: Self-pay | Admitting: Family Medicine

## 2022-03-17 DIAGNOSIS — Z1231 Encounter for screening mammogram for malignant neoplasm of breast: Secondary | ICD-10-CM

## 2022-03-20 ENCOUNTER — Inpatient Hospital Stay: Payer: Medicare Other | Attending: Hematology & Oncology

## 2022-03-20 ENCOUNTER — Other Ambulatory Visit: Payer: Self-pay | Admitting: Family Medicine

## 2022-03-20 DIAGNOSIS — Z79899 Other long term (current) drug therapy: Secondary | ICD-10-CM | POA: Diagnosis not present

## 2022-03-20 DIAGNOSIS — D509 Iron deficiency anemia, unspecified: Secondary | ICD-10-CM | POA: Insufficient documentation

## 2022-03-20 DIAGNOSIS — I10 Essential (primary) hypertension: Secondary | ICD-10-CM

## 2022-03-20 DIAGNOSIS — D500 Iron deficiency anemia secondary to blood loss (chronic): Secondary | ICD-10-CM

## 2022-03-20 LAB — CBC WITH DIFFERENTIAL (CANCER CENTER ONLY)
Abs Immature Granulocytes: 0.02 10*3/uL (ref 0.00–0.07)
Basophils Absolute: 0.1 10*3/uL (ref 0.0–0.1)
Basophils Relative: 1 %
Eosinophils Absolute: 0.3 10*3/uL (ref 0.0–0.5)
Eosinophils Relative: 5 %
HCT: 39.1 % (ref 36.0–46.0)
Hemoglobin: 12.8 g/dL (ref 12.0–15.0)
Immature Granulocytes: 0 %
Lymphocytes Relative: 21 %
Lymphs Abs: 1.1 10*3/uL (ref 0.7–4.0)
MCH: 29.7 pg (ref 26.0–34.0)
MCHC: 32.7 g/dL (ref 30.0–36.0)
MCV: 90.7 fL (ref 80.0–100.0)
Monocytes Absolute: 0.7 10*3/uL (ref 0.1–1.0)
Monocytes Relative: 13 %
Neutro Abs: 3.2 10*3/uL (ref 1.7–7.7)
Neutrophils Relative %: 60 %
Platelet Count: 225 10*3/uL (ref 150–400)
RBC: 4.31 MIL/uL (ref 3.87–5.11)
RDW: 16.8 % — ABNORMAL HIGH (ref 11.5–15.5)
WBC Count: 5.3 10*3/uL (ref 4.0–10.5)
nRBC: 0 % (ref 0.0–0.2)

## 2022-03-20 LAB — RETICULOCYTES
Immature Retic Fract: 7.2 % (ref 2.3–15.9)
RBC.: 4.27 MIL/uL (ref 3.87–5.11)
Retic Count, Absolute: 64 10*3/uL (ref 19.0–186.0)
Retic Ct Pct: 1.5 % (ref 0.4–3.1)

## 2022-03-20 LAB — FERRITIN: Ferritin: 94 ng/mL (ref 11–307)

## 2022-03-20 LAB — IRON AND IRON BINDING CAPACITY (CC-WL,HP ONLY)
Iron: 66 ug/dL (ref 28–170)
Saturation Ratios: 18 % (ref 10.4–31.8)
TIBC: 358 ug/dL (ref 250–450)
UIBC: 292 ug/dL (ref 148–442)

## 2022-04-04 ENCOUNTER — Other Ambulatory Visit: Payer: Self-pay | Admitting: Family Medicine

## 2022-04-04 DIAGNOSIS — I10 Essential (primary) hypertension: Secondary | ICD-10-CM

## 2022-04-08 ENCOUNTER — Telehealth: Payer: Self-pay

## 2022-04-08 NOTE — Telephone Encounter (Signed)
Received fax from [facility], Pt requesting a refill on "Dr Tera Helper nasal spray", when googled medication is Ipratroprium Bromide Nasal Solution, 0.06%Nasal Spray. Please advise?

## 2022-04-08 NOTE — Telephone Encounter (Signed)
Called and gave VO for her spray

## 2022-04-10 DIAGNOSIS — H43813 Vitreous degeneration, bilateral: Secondary | ICD-10-CM | POA: Diagnosis not present

## 2022-04-10 DIAGNOSIS — D3131 Benign neoplasm of right choroid: Secondary | ICD-10-CM | POA: Diagnosis not present

## 2022-04-10 DIAGNOSIS — H52203 Unspecified astigmatism, bilateral: Secondary | ICD-10-CM | POA: Diagnosis not present

## 2022-04-10 DIAGNOSIS — H59811 Chorioretinal scars after surgery for detachment, right eye: Secondary | ICD-10-CM | POA: Diagnosis not present

## 2022-04-10 DIAGNOSIS — H2513 Age-related nuclear cataract, bilateral: Secondary | ICD-10-CM | POA: Diagnosis not present

## 2022-04-10 DIAGNOSIS — H5213 Myopia, bilateral: Secondary | ICD-10-CM | POA: Diagnosis not present

## 2022-04-10 DIAGNOSIS — H524 Presbyopia: Secondary | ICD-10-CM | POA: Diagnosis not present

## 2022-04-30 ENCOUNTER — Telehealth: Payer: Self-pay | Admitting: *Deleted

## 2022-04-30 ENCOUNTER — Inpatient Hospital Stay: Payer: Medicare Other | Attending: Hematology & Oncology

## 2022-04-30 ENCOUNTER — Other Ambulatory Visit: Payer: Self-pay | Admitting: *Deleted

## 2022-04-30 DIAGNOSIS — Z79899 Other long term (current) drug therapy: Secondary | ICD-10-CM | POA: Insufficient documentation

## 2022-04-30 DIAGNOSIS — D509 Iron deficiency anemia, unspecified: Secondary | ICD-10-CM | POA: Insufficient documentation

## 2022-04-30 DIAGNOSIS — D500 Iron deficiency anemia secondary to blood loss (chronic): Secondary | ICD-10-CM

## 2022-04-30 LAB — CBC WITH DIFFERENTIAL (CANCER CENTER ONLY)
Abs Immature Granulocytes: 0.02 10*3/uL (ref 0.00–0.07)
Basophils Absolute: 0.1 10*3/uL (ref 0.0–0.1)
Basophils Relative: 1 %
Eosinophils Absolute: 0.2 10*3/uL (ref 0.0–0.5)
Eosinophils Relative: 4 %
HCT: 32.5 % — ABNORMAL LOW (ref 36.0–46.0)
Hemoglobin: 10.9 g/dL — ABNORMAL LOW (ref 12.0–15.0)
Immature Granulocytes: 0 %
Lymphocytes Relative: 18 %
Lymphs Abs: 1 10*3/uL (ref 0.7–4.0)
MCH: 30.1 pg (ref 26.0–34.0)
MCHC: 33.5 g/dL (ref 30.0–36.0)
MCV: 89.8 fL (ref 80.0–100.0)
Monocytes Absolute: 0.6 10*3/uL (ref 0.1–1.0)
Monocytes Relative: 10 %
Neutro Abs: 3.6 10*3/uL (ref 1.7–7.7)
Neutrophils Relative %: 67 %
Platelet Count: 244 10*3/uL (ref 150–400)
RBC: 3.62 MIL/uL — ABNORMAL LOW (ref 3.87–5.11)
RDW: 14.8 % (ref 11.5–15.5)
WBC Count: 5.4 10*3/uL (ref 4.0–10.5)
nRBC: 0 % (ref 0.0–0.2)

## 2022-04-30 LAB — FERRITIN: Ferritin: 22 ng/mL (ref 11–307)

## 2022-04-30 LAB — RETICULOCYTES
Immature Retic Fract: 9.8 % (ref 2.3–15.9)
RBC.: 3.63 MIL/uL — ABNORMAL LOW (ref 3.87–5.11)
Retic Count, Absolute: 78 10*3/uL (ref 19.0–186.0)
Retic Ct Pct: 2.2 % (ref 0.4–3.1)

## 2022-04-30 NOTE — Telephone Encounter (Signed)
Call received from patient stating that she is not feeling well and would like to know if she can come in for lab work.  Pt states that she can be here by 2:30PM today.  Pt instructed to come in today for lab work at 2:30PM.  Message sent to scheduling.

## 2022-05-01 ENCOUNTER — Other Ambulatory Visit: Payer: Self-pay | Admitting: Hematology & Oncology

## 2022-05-01 LAB — IRON AND IRON BINDING CAPACITY (CC-WL,HP ONLY)
Iron: 26 ug/dL — ABNORMAL LOW (ref 28–170)
Saturation Ratios: 6 % — ABNORMAL LOW (ref 10.4–31.8)
TIBC: 435 ug/dL (ref 250–450)
UIBC: 409 ug/dL (ref 148–442)

## 2022-05-02 ENCOUNTER — Encounter: Payer: Self-pay | Admitting: Family

## 2022-05-07 ENCOUNTER — Encounter: Payer: Self-pay | Admitting: Family Medicine

## 2022-05-07 ENCOUNTER — Ambulatory Visit (INDEPENDENT_AMBULATORY_CARE_PROVIDER_SITE_OTHER): Payer: Medicare Other | Admitting: Family Medicine

## 2022-05-07 VITALS — BP 144/86 | HR 59 | Resp 18 | Ht 63.0 in | Wt 183.0 lb

## 2022-05-07 DIAGNOSIS — J029 Acute pharyngitis, unspecified: Secondary | ICD-10-CM

## 2022-05-07 LAB — POCT RAPID STREP A (OFFICE): Rapid Strep A Screen: NEGATIVE

## 2022-05-07 NOTE — Progress Notes (Signed)
   Acute Office Visit  Subjective:     Patient ID: DEMETRIANA CACCIATORE, female    DOB: 1940/04/14, 82 y.o.   MRN: [id number]  Chief Complaint  Patient presents with   Sore Throat    Onset 05/02/2022 Hurts a little more everyday      Patient is in today for sore throat   Sore Throat: Patient complains of sore throat. Onset of symptoms was 4 days ago, gradually worsening since that time. She is drinking plenty of fluids. She has not had recent close exposure to someone with proven streptococcal pharyngitis. She has been around crowds some last week, but known sickness.  No congestion, rhinorrhea, cough, headaches/sinus pressure, fevers, chills, nausea, vomiting, diarrhea.  COVID test negative at home.   ROS All review of systems negative except what is listed in the HPI      Objective:    BP (!) 144/86 (BP Location: Right Arm, Patient Position: Sitting, Cuff Size: Normal)   Pulse (!) 59   Resp 18   Ht 5\' 3"  (1.6 m)   Wt 183 lb (83 kg)   SpO2 99%   BMI 32.42 kg/m    Physical Exam Vitals reviewed.  Constitutional:      Appearance: She is well-developed.  HENT:     Head: Normocephalic and atraumatic.     Right Ear: Tympanic membrane normal.     Left Ear: Tympanic membrane normal.     Nose: No congestion or rhinorrhea.     Mouth/Throat:     Pharynx: Posterior oropharyngeal erythema present. No pharyngeal swelling or oropharyngeal exudate.  Cardiovascular:     Rate and Rhythm: Normal rate and regular rhythm.  Pulmonary:     Effort: Pulmonary effort is normal.     Breath sounds: Normal breath sounds.  Lymphadenopathy:     Cervical: No cervical adenopathy.  Neurological:     Mental Status: She is alert.     Results for orders placed or performed in visit on 05/07/22  POCT rapid strep A  Result Value Ref Range   Rapid Strep A Screen Negative Negative        Assessment & Plan:   Problem List Items Addressed This Visit   None Visit Diagnoses     Sore  throat    -  Primary Strep test is negative.  Your throat irritation could be viral or allergy related. Recommend warm liquids with honey and lemon, over-the-counter analgesics and cough/cold/allergy medicines. Make sure you are staying well hydrated.  Patient aware of signs/symptoms requiring further/urgent evaluation.    Relevant Orders   POCT rapid strep A (Completed)       No orders of the defined types were placed in this encounter.   Return in about 2 weeks (around 05/21/2022) for BP check with nurse.  Terrilyn Saver, NP

## 2022-05-07 NOTE — Patient Instructions (Addendum)
Strep test is negative.  Your throat irritation could be viral or allergy related. Recommend warm liquids with honey and lemon, over-the-counter analgesics and cough/cold/allergy medicines. Make sure you are staying well hydrated.   Please contact office for follow-up if symptoms do not improve or worsen. Seek emergency care if symptoms become severe.

## 2022-05-08 ENCOUNTER — Inpatient Hospital Stay: Payer: Medicare Other

## 2022-05-08 VITALS — BP 129/66 | HR 54 | Temp 97.8°F | Resp 17

## 2022-05-08 DIAGNOSIS — D500 Iron deficiency anemia secondary to blood loss (chronic): Secondary | ICD-10-CM

## 2022-05-08 DIAGNOSIS — D509 Iron deficiency anemia, unspecified: Secondary | ICD-10-CM | POA: Diagnosis not present

## 2022-05-08 DIAGNOSIS — Z79899 Other long term (current) drug therapy: Secondary | ICD-10-CM | POA: Diagnosis not present

## 2022-05-08 MED ORDER — SODIUM CHLORIDE 0.9 % IV SOLN
510.0000 mg | Freq: Once | INTRAVENOUS | Status: AC
  Administered 2022-05-08: 510 mg via INTRAVENOUS
  Filled 2022-05-08: qty 510

## 2022-05-08 MED ORDER — SODIUM CHLORIDE 0.9 % IV SOLN: Freq: Once | INTRAVENOUS | Status: AC

## 2022-05-08 NOTE — Patient Instructions (Signed)

## 2022-05-15 ENCOUNTER — Inpatient Hospital Stay: Payer: Medicare Other

## 2022-05-15 VITALS — BP 142/52 | HR 60 | Temp 98.6°F | Resp 17

## 2022-05-15 DIAGNOSIS — D500 Iron deficiency anemia secondary to blood loss (chronic): Secondary | ICD-10-CM

## 2022-05-15 DIAGNOSIS — D509 Iron deficiency anemia, unspecified: Secondary | ICD-10-CM | POA: Diagnosis not present

## 2022-05-15 DIAGNOSIS — Z79899 Other long term (current) drug therapy: Secondary | ICD-10-CM | POA: Diagnosis not present

## 2022-05-15 MED ORDER — SODIUM CHLORIDE 0.9 % IV SOLN: Freq: Once | INTRAVENOUS | Status: AC

## 2022-05-15 MED ORDER — SODIUM CHLORIDE 0.9 % IV SOLN
510.0000 mg | Freq: Once | INTRAVENOUS | Status: AC
  Administered 2022-05-15: 510 mg via INTRAVENOUS
  Filled 2022-05-15: qty 510

## 2022-05-15 NOTE — Patient Instructions (Signed)

## 2022-05-15 NOTE — Progress Notes (Signed)
Pt declined to stay for post infusion observation period. Pt stated she has tolerated medication multiple times prior without difficulty. Pt aware to call clinic with any questions or concerns. Pt verbalized understanding and had no further questions.  ? ?

## 2022-05-16 ENCOUNTER — Other Ambulatory Visit: Payer: Self-pay | Admitting: Family Medicine

## 2022-05-16 DIAGNOSIS — F410 Panic disorder [episodic paroxysmal anxiety] without agoraphobia: Secondary | ICD-10-CM

## 2022-06-06 ENCOUNTER — Other Ambulatory Visit: Payer: Self-pay | Admitting: Family Medicine

## 2022-06-06 ENCOUNTER — Encounter: Payer: Self-pay | Admitting: Family Medicine

## 2022-06-06 DIAGNOSIS — F410 Panic disorder [episodic paroxysmal anxiety] without agoraphobia: Secondary | ICD-10-CM

## 2022-06-13 ENCOUNTER — Encounter: Payer: Self-pay | Admitting: Family

## 2022-06-13 ENCOUNTER — Other Ambulatory Visit: Payer: Self-pay | Admitting: Family

## 2022-06-13 ENCOUNTER — Inpatient Hospital Stay (HOSPITAL_BASED_OUTPATIENT_CLINIC_OR_DEPARTMENT_OTHER): Payer: Medicare Other | Admitting: Family

## 2022-06-13 ENCOUNTER — Inpatient Hospital Stay: Payer: Medicare Other | Attending: Hematology & Oncology

## 2022-06-13 ENCOUNTER — Other Ambulatory Visit: Payer: Self-pay

## 2022-06-13 VITALS — BP 159/66 | HR 55 | Temp 97.7°F | Resp 17 | Wt 185.0 lb

## 2022-06-13 DIAGNOSIS — Z881 Allergy status to other antibiotic agents status: Secondary | ICD-10-CM | POA: Diagnosis not present

## 2022-06-13 DIAGNOSIS — Z887 Allergy status to serum and vaccine status: Secondary | ICD-10-CM | POA: Diagnosis not present

## 2022-06-13 DIAGNOSIS — D500 Iron deficiency anemia secondary to blood loss (chronic): Secondary | ICD-10-CM

## 2022-06-13 DIAGNOSIS — M25471 Effusion, right ankle: Secondary | ICD-10-CM | POA: Diagnosis not present

## 2022-06-13 DIAGNOSIS — M25472 Effusion, left ankle: Secondary | ICD-10-CM | POA: Diagnosis not present

## 2022-06-13 DIAGNOSIS — Z888 Allergy status to other drugs, medicaments and biological substances status: Secondary | ICD-10-CM | POA: Diagnosis not present

## 2022-06-13 DIAGNOSIS — Z882 Allergy status to sulfonamides status: Secondary | ICD-10-CM | POA: Insufficient documentation

## 2022-06-13 DIAGNOSIS — R0602 Shortness of breath: Secondary | ICD-10-CM | POA: Diagnosis not present

## 2022-06-13 DIAGNOSIS — Z886 Allergy status to analgesic agent status: Secondary | ICD-10-CM | POA: Diagnosis not present

## 2022-06-13 DIAGNOSIS — D509 Iron deficiency anemia, unspecified: Secondary | ICD-10-CM | POA: Insufficient documentation

## 2022-06-13 DIAGNOSIS — Z885 Allergy status to narcotic agent status: Secondary | ICD-10-CM | POA: Diagnosis not present

## 2022-06-13 DIAGNOSIS — Z79899 Other long term (current) drug therapy: Secondary | ICD-10-CM | POA: Diagnosis not present

## 2022-06-13 DIAGNOSIS — Z880 Allergy status to penicillin: Secondary | ICD-10-CM | POA: Insufficient documentation

## 2022-06-13 DIAGNOSIS — R5383 Other fatigue: Secondary | ICD-10-CM | POA: Diagnosis not present

## 2022-06-13 LAB — RETICULOCYTES
Immature Retic Fract: 7.5 % (ref 2.3–15.9)
RBC.: 3.85 MIL/uL — ABNORMAL LOW (ref 3.87–5.11)
Retic Count, Absolute: 108.2 10*3/uL (ref 19.0–186.0)
Retic Ct Pct: 2.8 % (ref 0.4–3.1)

## 2022-06-13 LAB — IRON AND IRON BINDING CAPACITY (CC-WL,HP ONLY)
Iron: 78 ug/dL (ref 28–170)
Saturation Ratios: 24 % (ref 10.4–31.8)
TIBC: 321 ug/dL (ref 250–450)
UIBC: 243 ug/dL (ref 148–442)

## 2022-06-13 LAB — CBC WITH DIFFERENTIAL (CANCER CENTER ONLY)
Abs Immature Granulocytes: 0.02 10*3/uL (ref 0.00–0.07)
Basophils Absolute: 0.1 10*3/uL (ref 0.0–0.1)
Basophils Relative: 1 %
Eosinophils Absolute: 0.4 10*3/uL (ref 0.0–0.5)
Eosinophils Relative: 6 %
HCT: 35.8 % — ABNORMAL LOW (ref 36.0–46.0)
Hemoglobin: 12.2 g/dL (ref 12.0–15.0)
Immature Granulocytes: 0 %
Lymphocytes Relative: 18 %
Lymphs Abs: 1 10*3/uL (ref 0.7–4.0)
MCH: 31.4 pg (ref 26.0–34.0)
MCHC: 34.1 g/dL (ref 30.0–36.0)
MCV: 92 fL (ref 80.0–100.0)
Monocytes Absolute: 0.7 10*3/uL (ref 0.1–1.0)
Monocytes Relative: 11 %
Neutro Abs: 3.8 10*3/uL (ref 1.7–7.7)
Neutrophils Relative %: 64 %
Platelet Count: 214 10*3/uL (ref 150–400)
RBC: 3.89 MIL/uL (ref 3.87–5.11)
RDW: 14.1 % (ref 11.5–15.5)
WBC Count: 5.9 10*3/uL (ref 4.0–10.5)
nRBC: 0 % (ref 0.0–0.2)

## 2022-06-13 LAB — FERRITIN: Ferritin: 278 ng/mL (ref 11–307)

## 2022-06-13 NOTE — Progress Notes (Signed)
Hematology and Oncology Follow Up Visit  Angela Gibbs [id number] 1940-12-29 82 y.o. 06/13/2022   Principle Diagnosis:  Iron deficiency anemia   Current Therapy:        IV iron as indicated    Interim History:  Angela Gibbs is here today for follow-up. She is fatigued at times.  No obvious blood loss noted. No bruising or petechiae.  SOB with asthma unchanged from baseline.  No fever, chills, n/v, cough, rash, dizziness, chest pain, palpitations, abdominal pain or changes in bowel or bladder habits.  No tenderness, numbness or tingling in her extremities at this time.  Swelling mild in her feet and ankles. She is wearing compression stockings daily for added support.  No falls or syncope.  Appetite and hydration are good. Weight is stable at 185 lbs.   ECOG Performance Status: 1 - Symptomatic but completely ambulatory  Medications:  Allergies as of 06/13/2022       Reactions   Cephalosporins Shortness Of Breath   Breathing difficulty   Ciprofloxacin Hives, Shortness Of Breath   Influenza Virus Vaccine Other (See Comments)   Numbness in legs   Keflex [cephalexin] Hives   Latex Hives, Itching, Rash   Levaquin [levofloxacin] Hives, Palpitations, Other (See Comments)   Chest pain   Nitrofuran Derivatives Hives, Itching, Swelling   Penicillins Hives   Sulfacetamide Sodium Hives   Xopenex [levalbuterol Hcl] Anxiety, Other (See Comments)   Tremors with violent body chattering and shaking   Zostavax [zoster Vaccine Live] Other (See Comments)   Pt has been told by specialists not to receive vaccine d/t h/o herpes in bloodstream and shingles.   Erythromycin Other (See Comments)   "stomach issues"   Hydrochlorothiazide Other (See Comments)   Migraine   Lisinopril Cough   Petrolatum Nausea Only   Pregabalin Other (See Comments)   Ropinirole Hcl Nausea And Vomiting   Shellfish Allergy Hives   Sulfa Antibiotics Hives   Golimumab    Other reaction(s): ?felt poorly   Ibuprofen     Other reaction(s): stomach   Aspirin Other (See Comments)   Stomach pain/burning   Avelox [moxifloxacin Hcl In Nacl] Nausea Only   Cefdinir Nausea And Vomiting   Chromium Other (See Comments)   Blisters   Egg-derived Products    Per allergy testing, no known reaction. Pt states she was told she could probably eat 2-3 eggs weekly w/o issue.   Iodinated Contrast Media Other (See Comments)   unknown   Lactose Intolerance (gi) Other (See Comments)   "stomach issues"   Percodan [oxycodone-aspirin] Nausea And Vomiting   Prednisone Palpitations   No injections, can do the pills   Troleandomycin Other (See Comments)   Unknown reaction        Medication List        Accurate as of June 13, 2022  1:12 PM. If you have any questions, ask your nurse or doctor.          ALPRAZolam 0.25 MG tablet Commonly known as: XANAX TAKE 1 TABLET(0.25 MG) BY MOUTH TWICE DAILY AS NEEDED FOR ANXIETY   B-12 Compliance Injection 1000 MCG/ML Kit Generic drug: Cyanocobalamin Inject 1,000 mcg as directed every 30 (thirty) days.   Cholecalciferol 125 MCG (5000 UT) Tabs Take 5,000 Units by mouth daily.   clindamycin 150 MG capsule Commonly known as: CLEOCIN Take 450 mg by mouth 3 (three) times daily. 1 hour prior to dental procedures and 6 hours post dental procedure   cyclobenzaprine 5 MG tablet Commonly  known as: FLEXERIL Take 5 mg by mouth 2 (two) times daily as needed for muscle spasms.   diclofenac Sodium 1 % Gel Commonly known as: VOLTAREN APPLY 2 GRAMS EXTERNALLY TO THE AFFECTED AREA FOUR TIMES DAILY   doxycycline 100 MG tablet Commonly known as: VIBRA-TABS Take 1 tablet (100 mg total) by mouth 2 (two) times daily.   FLUoxetine 20 MG capsule Commonly known as: PROZAC Take 1 capsule (20 mg total) by mouth daily.   fluticasone 50 MCG/ACT nasal spray Commonly known as: FLONASE Place 2 sprays into both nostrils daily.   furosemide 20 MG tablet Commonly known as: LASIX Take 1  tablet (20 mg total) by mouth daily. What changed: when to take this   HYDROcodone-acetaminophen 5-325 MG tablet Commonly known as: NORCO/VICODIN Take 1 tablet by mouth every 6 (six) hours as needed for pain.   MAGNESIUM GLYCINATE PO Take 50 mg by mouth daily.   montelukast 10 MG tablet Commonly known as: SINGULAIR Take 1 tablet (10 mg total) by mouth at bedtime.   nebivolol 10 MG tablet Commonly known as: BYSTOLIC TAKE 1 TABLET(10 MG) BY MOUTH DAILY   omeprazole 20 MG tablet Commonly known as: PRILOSEC OTC Take 20 mg by mouth daily.   oseltamivir 75 MG capsule Commonly known as: Tamiflu Take 1 capsule (75 mg total) by mouth 2 (two) times daily.   Prednicarbate 0.1 % Crea Apply 1 Application topically as needed for itching.   predniSONE 50 MG tablet Commonly known as: DELTASONE Prednisone 50 mg - take 13 hours prior to test Take another Prednisone 50 mg 7 hours prior to test Take another Prednisone 50 mg 1 hour prior to test Take Benadryl 50 mg 1 hour prior to test Patient must complete all four doses of above prophylactic medications. Patient will need a ride after test due to Benadryl.   promethazine 25 MG tablet Commonly known as: PHENERGAN Take 1 tablet (25 mg total) by mouth every 6 (six) hours as needed for nausea or vomiting.   sodium chloride 0.65 % nasal spray Commonly known as: V-R NASAL SPRAY SALINE Place 1 spray into the nose as needed. Use in both nostrils twice daily   spironolactone 25 MG tablet Commonly known as: ALDACTONE Take 0.5 tablets (12.5 mg total) by mouth daily.   thyroid 30 MG tablet Commonly known as: ARMOUR Take 30 mg by mouth daily.   valsartan 320 MG tablet Commonly known as: DIOVAN Take 1 tablet (320 mg total) by mouth daily.   vitamin C 1000 MG tablet Take 1,000 mg by mouth daily.        Allergies:  Allergies  Allergen Reactions   Cephalosporins Shortness Of Breath    Breathing difficulty   Ciprofloxacin Hives and  Shortness Of Breath   Influenza Virus Vaccine Other (See Comments)    Numbness in legs   Keflex [Cephalexin] Hives   Latex Hives, Itching and Rash   Levaquin [Levofloxacin] Hives, Palpitations and Other (See Comments)    Chest pain   Nitrofuran Derivatives Hives, Itching and Swelling   Penicillins Hives   Sulfacetamide Sodium Hives   Xopenex [Levalbuterol Hcl] Anxiety and Other (See Comments)    Tremors with violent body chattering and shaking   Zostavax [Zoster Vaccine Live] Other (See Comments)    Pt has been told by specialists not to receive vaccine d/t h/o herpes in bloodstream and shingles.   Erythromycin Other (See Comments)    "stomach issues"   Hydrochlorothiazide Other (See Comments)  Migraine   Lisinopril Cough   Petrolatum Nausea Only   Pregabalin Other (See Comments)   Ropinirole Hcl Nausea And Vomiting   Shellfish Allergy Hives   Sulfa Antibiotics Hives   Golimumab     Other reaction(s): ?felt poorly   Ibuprofen     Other reaction(s): stomach   Aspirin Other (See Comments)    Stomach pain/burning   Avelox [Moxifloxacin Hcl In Nacl] Nausea Only   Cefdinir Nausea And Vomiting   Chromium Other (See Comments)    Blisters   Egg-Derived Products     Per allergy testing, no known reaction. Pt states she was told she could probably eat 2-3 eggs weekly w/o issue.   Iodinated Contrast Media Other (See Comments)    unknown    Lactose Intolerance (Gi) Other (See Comments)    "stomach issues"   Percodan [Oxycodone-Aspirin] Nausea And Vomiting   Prednisone Palpitations    No injections, can do the pills   Troleandomycin Other (See Comments)    Unknown reaction    Past Medical History, Surgical history, Social history, and Family History were reviewed and updated.  Review of Systems: All other 10 point review of systems is negative.   Physical Exam:  vitals were not taken for this visit.   Wt Readings from Last 3 Encounters:  05/07/22 183 lb (83 kg)   12/25/21 182 lb (82.6 kg)  12/11/21 182 lb 1.9 oz (82.6 kg)    Ocular: Sclerae unicteric, pupils equal, round and reactive to light Ear-nose-throat: Oropharynx clear, dentition fair Lymphatic: No cervical or supraclavicular adenopathy Lungs no rales or rhonchi, good excursion bilaterally Heart regular rate and rhythm, no murmur appreciated Abd soft, nontender, positive bowel sounds MSK no focal spinal tenderness, no joint edema Neuro: non-focal, well-oriented, appropriate affect Breasts: Deferred   Lab Results  Component Value Date   WBC 5.4 04/30/2022   HGB 10.9 (L) 04/30/2022   HCT 32.5 (L) 04/30/2022   MCV 89.8 04/30/2022   PLT 244 04/30/2022   Lab Results  Component Value Date   FERRITIN 22 04/30/2022   IRON 26 (L) 04/30/2022   TIBC 435 04/30/2022   UIBC 409 04/30/2022   IRONPCTSAT 6 (L) 04/30/2022   Lab Results  Component Value Date   RETICCTPCT 2.2 04/30/2022   RBC 3.62 (L) 04/30/2022   RBC 3.63 (L) 04/30/2022   No results found for: "KPAFRELGTCHN", "LAMBDASER", "KAPLAMBRATIO" No results found for: "IGGSERUM", "IGA", "IGMSERUM" No results found for: "TOTALPROTELP", "ALBUMINELP", "A1GS", "A2GS", "BETS", "BETA2SER", "GAMS", "MSPIKE", "SPEI"   Chemistry      Component Value Date/Time   NA 136 01/03/2022 0905   K 4.4 01/03/2022 0905   CL 100 01/03/2022 0905   CO2 22 01/03/2022 0905   BUN 14 01/03/2022 0905   CREATININE 0.70 01/03/2022 0905   CREATININE 0.70 05/01/2021 1430   CREATININE 0.76 12/07/2019 1413   GLU 80 11/18/2019 0000      Component Value Date/Time   CALCIUM 9.2 01/03/2022 0905   ALKPHOS 78 05/01/2021 1430   AST 18 05/01/2021 1430   ALT 16 05/01/2021 1430   BILITOT 0.4 05/01/2021 1430       Impression and Plan: Ms. Hodapp is a very pleasant 82 yo caucasian female with history of iron deficiency anemia.  Iron studies pending.  Lab only in 3 months and follow-up in 6 months.  Eileen Stanford, NP 4/26/20241:12 PM

## 2022-06-18 ENCOUNTER — Encounter: Payer: Self-pay | Admitting: *Deleted

## 2022-06-18 ENCOUNTER — Other Ambulatory Visit: Payer: Self-pay | Admitting: Family Medicine

## 2022-06-18 DIAGNOSIS — I10 Essential (primary) hypertension: Secondary | ICD-10-CM

## 2022-06-30 ENCOUNTER — Other Ambulatory Visit: Payer: Self-pay | Admitting: Family Medicine

## 2022-06-30 DIAGNOSIS — I10 Essential (primary) hypertension: Secondary | ICD-10-CM

## 2022-07-04 ENCOUNTER — Encounter: Payer: Self-pay | Admitting: *Deleted

## 2022-07-07 DIAGNOSIS — Z79899 Other long term (current) drug therapy: Secondary | ICD-10-CM | POA: Diagnosis not present

## 2022-07-07 DIAGNOSIS — M62838 Other muscle spasm: Secondary | ICD-10-CM | POA: Diagnosis not present

## 2022-07-07 DIAGNOSIS — G4489 Other headache syndrome: Secondary | ICD-10-CM | POA: Diagnosis not present

## 2022-07-07 DIAGNOSIS — Z5181 Encounter for therapeutic drug level monitoring: Secondary | ICD-10-CM | POA: Diagnosis not present

## 2022-07-07 DIAGNOSIS — B0229 Other postherpetic nervous system involvement: Secondary | ICD-10-CM | POA: Diagnosis not present

## 2022-07-30 ENCOUNTER — Ambulatory Visit (INDEPENDENT_AMBULATORY_CARE_PROVIDER_SITE_OTHER): Payer: Medicare Other | Admitting: Family Medicine

## 2022-07-30 VITALS — BP 132/80 | HR 55 | Temp 97.9°F | Resp 18 | Ht 63.0 in | Wt 182.8 lb

## 2022-07-30 DIAGNOSIS — Z13220 Encounter for screening for lipoid disorders: Secondary | ICD-10-CM | POA: Diagnosis not present

## 2022-07-30 DIAGNOSIS — E871 Hypo-osmolality and hyponatremia: Secondary | ICD-10-CM | POA: Diagnosis not present

## 2022-07-30 DIAGNOSIS — E039 Hypothyroidism, unspecified: Secondary | ICD-10-CM

## 2022-07-30 DIAGNOSIS — I10 Essential (primary) hypertension: Secondary | ICD-10-CM | POA: Diagnosis not present

## 2022-07-30 DIAGNOSIS — F329 Major depressive disorder, single episode, unspecified: Secondary | ICD-10-CM | POA: Diagnosis not present

## 2022-07-30 DIAGNOSIS — R7303 Prediabetes: Secondary | ICD-10-CM | POA: Diagnosis not present

## 2022-07-30 DIAGNOSIS — E2839 Other primary ovarian failure: Secondary | ICD-10-CM | POA: Diagnosis not present

## 2022-07-30 NOTE — Progress Notes (Addendum)
[facility] [address], [address] [place], [place] [address] [phone]  Date:  07/30/2022   Name:  Angela Gibbs   DOB:  13-Jun-1940   MRN:  [id number]  PCP:  Pearline Cables, MD    Chief Complaint: Follow-up (Concerns/ questions: none/)   History of Present Illness:  [name] is a 82 y.o. very pleasant female patient who presents with the following:  Patient seen today for periodic follow-up Most recent visit with myself was in September 2023- history of TIA, restrictive lung disease due to scoliosis and hiatal hernia, psoriatic arthritis, hypertension, iron deficiency anemia   Notes from last visit:  Patient seen today with concern of persistently elevated blood pressures.  She has been checking her blood pressures at home and notes they continue to run high We will continue valsartan and Bystolic, add Lasix 20 mg daily She is able to check her blood pressure at home, will let me know how she responds Given increasingly difficult to control hypertension, placed referral to cardiology and also ordered echocardiogram Refilled her alprazolam that she uses as needed for panic attack  She is not checking her BP at home - we note her BP looks good today  She is using furosemide maybe every other day and this seems to work well for her  Mammo done 8/23 Will order a bone density for her   Wt Readings from Last 3 Encounters:  07/30/22 182 lb 12.8 oz (82.9 kg)  06/13/22 185 lb (83.9 kg)  05/07/22 183 lb (83 kg)   BP Readings from Last 3 Encounters:  07/30/22 132/80  06/13/22 (!) 159/66  05/15/22 (!) 142/52   She continues to follow-up with oncology  Patient Active Problem List   Diagnosis Date Noted   Dyspnea on exertion 12/25/2021   Obesity (BMI 30.0-34.9) 12/25/2021   Allergy 12/13/2021   Concussion 12/13/2021   Falls 12/13/2021   Head injury 12/13/2021   Herpes 12/13/2021   Migraines 12/13/2021    Nerve damage 12/13/2021   Shingles 12/13/2021   Body mass index (BMI) 31.0-31.9, adult 12/13/2021   IDA (iron deficiency anemia) 01/11/2020   Positive FIT (fecal immunochemical test) 12/28/2019   Iron deficiency anemia due to chronic blood loss 12/28/2019   Psoriatic arthritis (HCC) 12/07/2019   Psoriasis 12/07/2019   Osteoarthritis 12/07/2019   TIA (transient ischemic attack) 05/18/2019   Restrictive lung disease 05/13/2016   Moderate persistent asthma without complication 05/13/2016   Current use of beta blocker 04/11/2016   Other allergic rhinitis 04/11/2016   Gastroesophageal reflux disease without esophagitis 04/11/2016   Essential hypertension 02/20/2016   Abdominal pain, acute, right upper quadrant 07/29/2013    Past Medical History:  Diagnosis Date   Abdominal pain, acute, right upper quadrant 07/29/2013   Allergy    Body mass index (BMI) 31.0-31.9, adult 12/13/2021   Concussion    Current use of beta blocker 04/11/2016   Essential hypertension 02/20/2016   Falls    Gastroesophageal reflux disease without esophagitis 04/11/2016   Head injury    Herpes    IDA (iron deficiency anemia) 01/11/2020   Iron deficiency anemia due to chronic blood loss 12/28/2019   Migraines    Moderate persistent asthma without complication 05/13/2016   Nerve damage    Osteoarthritis 12/07/2019   Other allergic rhinitis 04/11/2016   Positive FIT (fecal immunochemical test) 12/28/2019   Psoriasis 12/07/2019   Psoriatic arthritis (HCC) 12/07/2019  Managed by rheumatology   Restrictive lung disease 05/13/2016   Related to scoliosis and hiatal hernia   Shingles    TIA (transient ischemic attack) 05/18/2019    Past Surgical History:  Procedure Laterality Date   BRAIN SURGERY     CRANIOTOMY FOR EXTRACRANIAL-INTRACRANIAL BYPASS     EYE SURGERY  4.4.17   milia excision   HIP SURGERY     intracranial surgery   NERVE SURGERY     ROOT CANAL     TONSILLECTOMY      Social History   Tobacco  Use   Smoking status: Never   Smokeless tobacco: Never  Vaping Use   Vaping Use: Never used  Substance Use Topics   Alcohol use: Yes    Comment: social   Drug use: No    Family History  Problem Relation Age of Onset   Heart disease Father    Heart attack Father     Allergies  Allergen Reactions   Cephalosporins Shortness Of Breath    Breathing difficulty   Ciprofloxacin Hives and Shortness Of Breath   Influenza Virus Vaccine Other (See Comments)    Numbness in legs   Keflex [Cephalexin] Hives   Latex Hives, Itching and Rash   Levaquin [Levofloxacin] Hives, Palpitations and Other (See Comments)    Chest pain   Nitrofuran Derivatives Hives, Itching and Swelling   Penicillins Hives   Sulfacetamide Sodium Hives   Xopenex [Levalbuterol Hcl] Anxiety and Other (See Comments)    Tremors with violent body chattering and shaking   Zostavax [Zoster Vaccine Live] Other (See Comments)    Pt has been told by specialists not to receive vaccine d/t h/o herpes in bloodstream and shingles.   Erythromycin Other (See Comments)    "stomach issues"   Hydrochlorothiazide Other (See Comments)    Migraine   Lisinopril Cough   Petrolatum Nausea Only   Pregabalin Other (See Comments)   Ropinirole Hcl Nausea And Vomiting   Shellfish Allergy Hives   Sulfa Antibiotics Hives   Golimumab     Other reaction(s): ?felt poorly   Ibuprofen     Other reaction(s): stomach   Aspirin Other (See Comments)    Stomach pain/burning   Avelox [Moxifloxacin Hcl In Nacl] Nausea Only   Cefdinir Nausea And Vomiting   Chromium Other (See Comments)    Blisters   Egg-Derived Products     Per allergy testing, no known reaction. Pt states she was told she could probably eat 2-3 eggs weekly w/o issue.   Iodinated Contrast Media Other (See Comments)    unknown    Lactose Intolerance (Gi) Other (See Comments)    "stomach issues"   Percodan [Oxycodone-Aspirin] Nausea And Vomiting   Prednisone Palpitations    No  injections, can do the pills   Troleandomycin Other (See Comments)    Unknown reaction    Medication list has been reviewed and updated.  Current Outpatient Medications on File Prior to Visit  Medication Sig Dispense Refill   ALPRAZolam (XANAX) 0.25 MG tablet TAKE 1 TABLET(0.25 MG) BY MOUTH TWICE DAILY AS NEEDED FOR ANXIETY 30 tablet 0   Ascorbic Acid (VITAMIN C) 1000 MG tablet Take 1,000 mg by mouth daily.     Cholecalciferol 125 MCG (5000 UT) TABS Take 5,000 Units by mouth daily.     clindamycin (CLEOCIN) 150 MG capsule Take 450 mg by mouth 3 (three) times daily. 1 hour prior to dental procedures and 6 hours post dental procedure  Cyanocobalamin (B-12 COMPLIANCE INJECTION) 1000 MCG/ML KIT Inject 1,000 mcg as directed every 30 (thirty) days.     cyclobenzaprine (FLEXERIL) 5 MG tablet Take 5 mg by mouth 2 (two) times daily as needed for muscle spasms.     diclofenac Sodium (VOLTAREN) 1 % GEL APPLY 2 GRAMS EXTERNALLY TO THE AFFECTED AREA FOUR TIMES DAILY 100 g 0   FLUoxetine (PROZAC) 20 MG capsule Take 1 capsule (20 mg total) by mouth daily. 90 capsule 1   fluticasone (FLONASE) 50 MCG/ACT nasal spray Place 2 sprays into both nostrils daily. 16 g 5   furosemide (LASIX) 20 MG tablet Take 1 tablet (20 mg total) by mouth daily. (Patient taking differently: Take 20 mg by mouth every other day.) 90 tablet 3   HYDROcodone-acetaminophen (NORCO/VICODIN) 5-325 MG tablet Take 1 tablet by mouth every 6 (six) hours as needed for pain.     MAGNESIUM GLYCINATE PO Take 50 mg by mouth daily.     montelukast (SINGULAIR) 10 MG tablet TAKE 1 TABLET(10 MG) BY MOUTH AT BEDTIME 30 tablet 2   nebivolol (BYSTOLIC) 10 MG tablet TAKE 1 ZOXWRU(04 MG) BY MOUTH DAILY 90 tablet 0   omeprazole (PRILOSEC OTC) 20 MG tablet Take 20 mg by mouth daily.     Prednicarbate 0.1 % CREA Apply 1 Application topically as needed for itching.     sodium chloride (V-R NASAL SPRAY SALINE) 0.65 % nasal spray Place 1 spray into the nose as  needed. Use in both nostrils twice daily 30 mL 6   spironolactone (ALDACTONE) 25 MG tablet TAKE 1/2 TABLET(12.5 MG) BY MOUTH DAILY 30 tablet 3   thyroid (ARMOUR) 30 MG tablet Take 30 mg by mouth daily.     valsartan (DIOVAN) 320 MG tablet Take 1 tablet (320 mg total) by mouth daily. 90 tablet 3   oseltamivir (TAMIFLU) 75 MG capsule Take 1 capsule (75 mg total) by mouth 2 (two) times daily. (Patient not taking: Reported on 07/30/2022) 10 capsule 0   No current facility-administered medications on file prior to visit.    Review of Systems:  As per HPI- otherwise negative. Pulse Readings from Last 3 Encounters:  07/30/22 (!) 55  06/13/22 (!) 55  05/15/22 60     Physical Examination: Vitals:   07/30/22 1311  BP: 132/80  Pulse: (!) 55  Resp: 18  Temp: 97.9 F (36.6 C)  SpO2: 98%   Vitals:   07/30/22 1311  Weight: 182 lb 12.8 oz (82.9 kg)  Height: 5\' 3"  (1.6 m)   Body mass index is 32.38 kg/m. Ideal Body Weight: Weight in (lb) to have BMI = 25: 140.8  GEN: no acute distress.  Obese, looks well HEENT: Atraumatic, Normocephalic.  Bilateral TM wnl, oropharynx normal.  PEERL,EOMI.   Ears and Nose: No external deformity. CV: RRR, No M/G/R. No JVD. No thrill. No extra heart sounds. PULM: CTA B, no wheezes, crackles, rhonchi. No retractions. No resp. distress. No accessory muscle use. ABD: S, NT, ND, +BS. No rebound. No HSM. EXTR: No c/c/e PSYCH: Normally interactive. Conversant.    Assessment and Plan: Hyponatremia - Plan: Comprehensive metabolic panel  Screening for hyperlipidemia - Plan: Lipid panel  Essential hypertension - Plan: CBC, Comprehensive metabolic panel  Reactive depression  Hypothyroidism, unspecified type - Plan: TSH  Estrogen deficiency - Plan: DG Bone Density  Prediabetes - Plan: Hemoglobin A1c  Patient seen today for follow-up.  Will check on her hyponatremia, screen for hyperlipidemia, hypothyroidism.  Follow-up on A1c for history of  prediabetes She does not currently need a refill of her alprazolam but I can do this when it is needed Ordered bone density Will plan further follow- up pending labs.  Signed [name], MD  Addendum 6/13, received labs as below.  Message to patient Results for orders placed or performed in visit on 07/30/22  CBC  Result Value Ref Range   WBC 6.6 4.0 - 10.5 K/uL   RBC 4.02 3.87 - 5.11 Mil/uL   Platelets 275.0 150.0 - 400.0 K/uL   Hemoglobin 12.6 12.0 - 15.0 g/dL   HCT 10.2 72.5 - 36.6 %   MCV 93.2 78.0 - 100.0 fl   MCHC 33.5 30.0 - 36.0 g/dL   RDW 44.0 34.7 - 42.5 %  Comprehensive metabolic panel  Result Value Ref Range   Sodium 133 (L) 135 - 145 mEq/L   Potassium 4.5 3.5 - 5.1 mEq/L   Chloride 98 96 - 112 mEq/L   CO2 27 19 - 32 mEq/L   Glucose, Bld 87 70 - 99 mg/dL   BUN 15 6 - 23 mg/dL   Creatinine, Ser 9.56 0.40 - 1.20 mg/dL   Total Bilirubin 0.5 0.2 - 1.2 mg/dL   Alkaline Phosphatase 85 39 - 117 U/L   AST 18 0 - 37 U/L   ALT 18 0 - 35 U/L   Total Protein 6.9 6.0 - 8.3 g/dL   Albumin 4.0 3.5 - 5.2 g/dL   GFR 38.75 >64.33 mL/min   Calcium 9.3 8.4 - 10.5 mg/dL  Hemoglobin I9J  Result Value Ref Range   Hgb A1c MFr Bld 5.1 4.6 - 6.5 %  Lipid panel  Result Value Ref Range   Cholesterol 193 0 - 200 mg/dL   Triglycerides 188.4 0.0 - 149.0 mg/dL   HDL 16.60 >63.01 mg/dL   VLDL 60.1 0.0 - 09.3 mg/dL   LDL Cholesterol 235 (H) 0 - 99 mg/dL   Total CHOL/HDL Ratio 3    NonHDL 136.03   TSH  Result Value Ref Range   TSH 2.43 0.35 - 5.50 uIU/mL

## 2022-07-30 NOTE — Patient Instructions (Signed)
It was good to see you again today- I will be in touch with your results asap Please go to lab and then imaging on the ground floor for bone density Let me know when you need refills!  Assuming all is well please see me in about 6 months

## 2022-07-31 ENCOUNTER — Encounter: Payer: Self-pay | Admitting: Family Medicine

## 2022-07-31 LAB — LIPID PANEL
Cholesterol: 193 mg/dL (ref 0–200)
HDL: 56.6 mg/dL (ref >39.00)
LDL Cholesterol: 116 mg/dL — ABNORMAL HIGH (ref 0–99)
NonHDL: 136.03
Total CHOL/HDL Ratio: 3
Triglycerides: 102 mg/dL (ref 0.0–149.0)
VLDL: 20.4 mg/dL (ref 0.0–40.0)

## 2022-07-31 LAB — COMPREHENSIVE METABOLIC PANEL
ALT: 18 U/L (ref 0–35)
AST: 18 U/L (ref 0–37)
Albumin: 4 g/dL (ref 3.5–5.2)
Alkaline Phosphatase: 85 U/L (ref 39–117)
BUN: 15 mg/dL (ref 6–23)
CO2: 27 mEq/L (ref 19–32)
Calcium: 9.3 mg/dL (ref 8.4–10.5)
Chloride: 98 mEq/L (ref 96–112)
Creatinine, Ser: 0.66 mg/dL (ref 0.40–1.20)
GFR: 81.98 mL/min (ref >60.00)
Glucose, Bld: 87 mg/dL (ref 70–99)
Potassium: 4.5 mEq/L (ref 3.5–5.1)
Sodium: 133 mEq/L — ABNORMAL LOW (ref 135–145)
Total Bilirubin: 0.5 mg/dL (ref 0.2–1.2)
Total Protein: 6.9 g/dL (ref 6.0–8.3)

## 2022-07-31 LAB — CBC
HCT: 37.5 % (ref 36.0–46.0)
Hemoglobin: 12.6 g/dL (ref 12.0–15.0)
MCHC: 33.5 g/dL (ref 30.0–36.0)
MCV: 93.2 fl (ref 78.0–100.0)
Platelets: 275 10*3/uL (ref 150.0–400.0)
RBC: 4.02 Mil/uL (ref 3.87–5.11)
RDW: 14.1 % (ref 11.5–15.5)
WBC: 6.6 10*3/uL (ref 4.0–10.5)

## 2022-07-31 LAB — HEMOGLOBIN A1C: Hgb A1c MFr Bld: 5.1 % (ref 4.6–6.5)

## 2022-07-31 LAB — TSH: TSH: 2.43 u[IU]/mL (ref 0.35–5.50)

## 2022-08-11 ENCOUNTER — Ambulatory Visit (HOSPITAL_BASED_OUTPATIENT_CLINIC_OR_DEPARTMENT_OTHER)
Admission: RE | Admit: 2022-08-11 | Discharge: 2022-08-11 | Disposition: A | Discharge status: Home / Self Care | Payer: Medicare Other | Source: Ambulatory Visit | Attending: Family Medicine | Admitting: Family Medicine

## 2022-08-11 DIAGNOSIS — M8589 Other specified disorders of bone density and structure, multiple sites: Secondary | ICD-10-CM | POA: Diagnosis not present

## 2022-08-11 DIAGNOSIS — E2839 Other primary ovarian failure: Secondary | ICD-10-CM | POA: Insufficient documentation

## 2022-08-11 DIAGNOSIS — Z780 Asymptomatic menopausal state: Secondary | ICD-10-CM | POA: Diagnosis not present

## 2022-08-12 ENCOUNTER — Encounter: Payer: Self-pay | Admitting: Family Medicine

## 2022-08-12 DIAGNOSIS — M8580 Other specified disorders of bone density and structure, unspecified site: Secondary | ICD-10-CM

## 2022-08-12 HISTORY — DX: Other specified disorders of bone density and structure, unspecified site: M85.80

## 2022-10-27 IMAGING — DX DG CHEST 1V PORT
1 series · 1 of 1 positions shown · non-contrast
Comparison: Chest CT 10/17/2019

CLINICAL DATA: Cough and sore throat since last evening.

EXAM:
PORTABLE CHEST 1 VIEW

[chest ap]
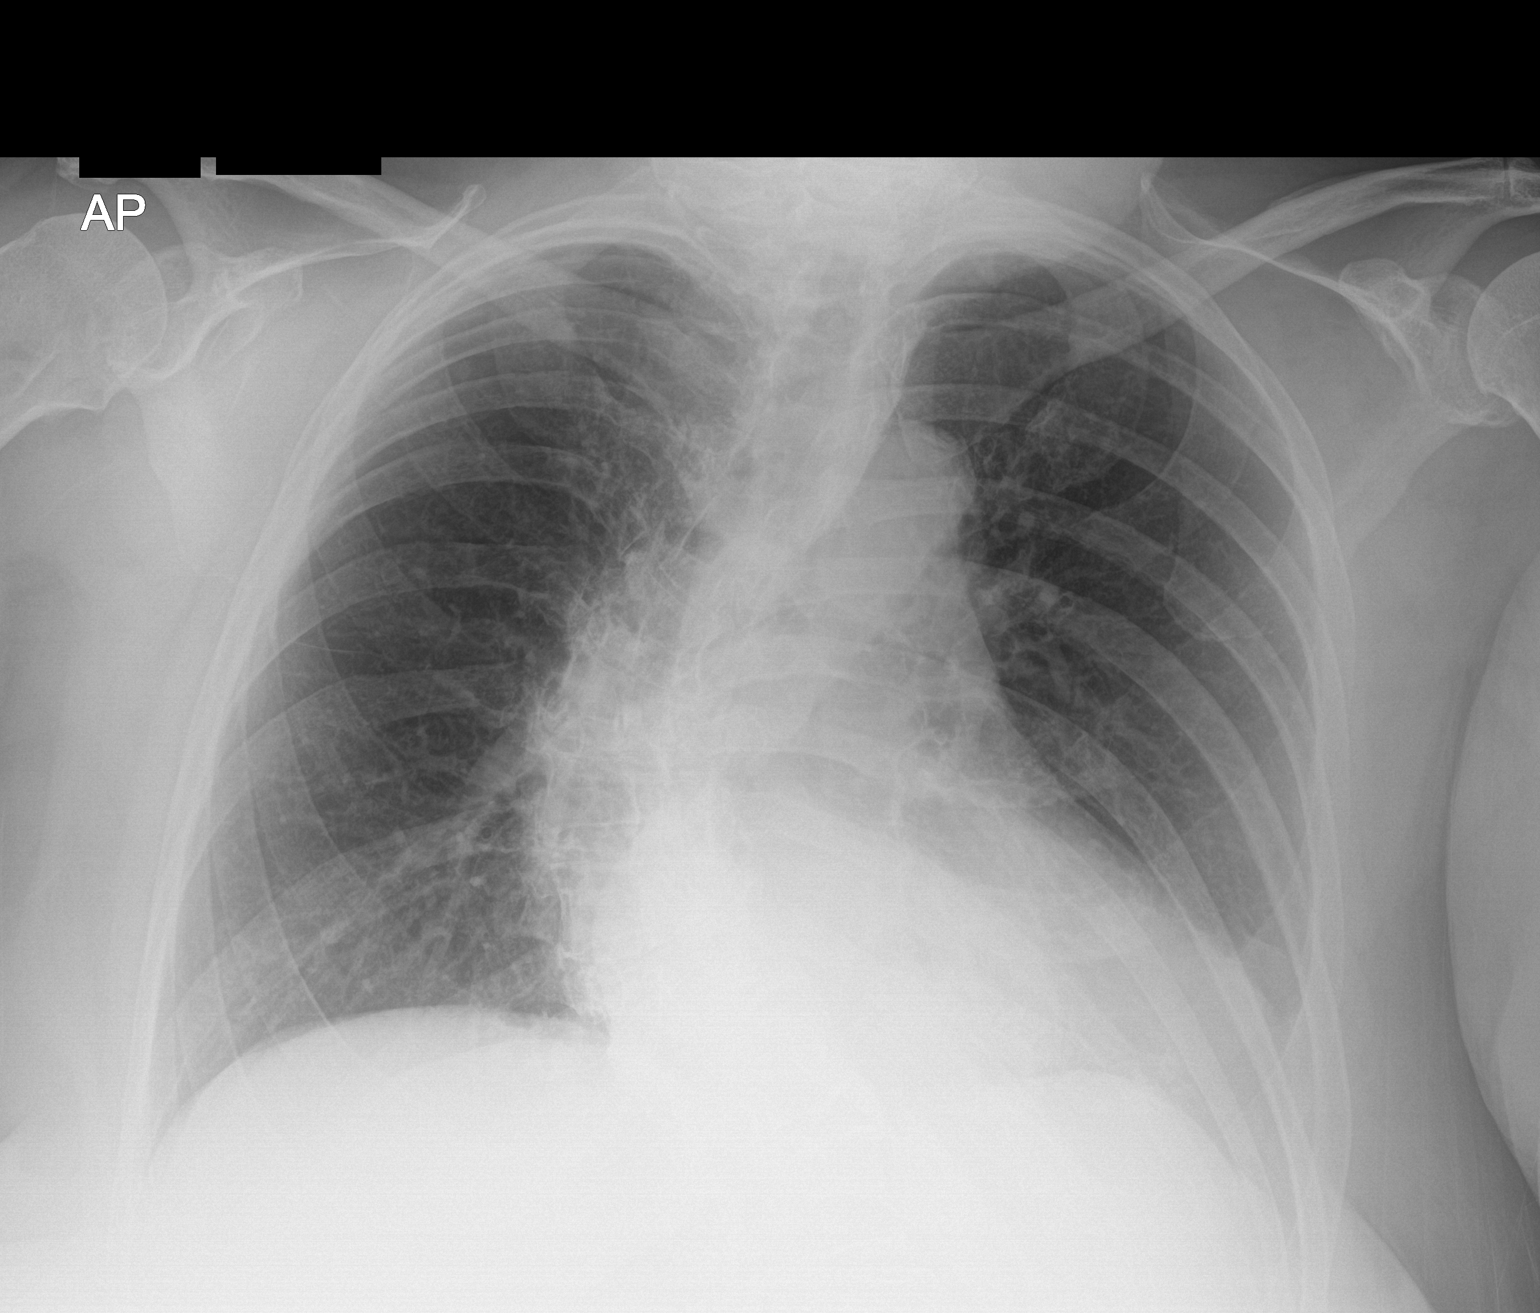

[1 of 1 positions shown; findings below may reference images not displayed]

FINDINGS: The cardiac silhouette, mediastinal and hilar contours are within
normal limits given the patient's age and severe scoliosis.

Large hiatal hernia again demonstrated.

No acute pulmonary findings.
IMPRESSION: No acute cardiopulmonary findings.

## 2022-10-29 ENCOUNTER — Encounter: Payer: Self-pay | Admitting: Family Medicine

## 2022-10-29 ENCOUNTER — Inpatient Hospital Stay: Payer: Medicare Other | Attending: Hematology & Oncology

## 2022-10-29 DIAGNOSIS — Z79899 Other long term (current) drug therapy: Secondary | ICD-10-CM | POA: Insufficient documentation

## 2022-10-29 DIAGNOSIS — D509 Iron deficiency anemia, unspecified: Secondary | ICD-10-CM | POA: Diagnosis not present

## 2022-10-29 DIAGNOSIS — D500 Iron deficiency anemia secondary to blood loss (chronic): Secondary | ICD-10-CM

## 2022-10-29 LAB — CBC WITH DIFFERENTIAL (CANCER CENTER ONLY)
Abs Immature Granulocytes: 0.01 10*3/uL (ref 0.00–0.07)
Basophils Absolute: 0.1 10*3/uL (ref 0.0–0.1)
Basophils Relative: 1 %
Eosinophils Absolute: 0.3 10*3/uL (ref 0.0–0.5)
Eosinophils Relative: 5 %
HCT: 36.5 % (ref 36.0–46.0)
Hemoglobin: 12.2 g/dL (ref 12.0–15.0)
Immature Granulocytes: 0 %
Lymphocytes Relative: 17 %
Lymphs Abs: 0.9 10*3/uL (ref 0.7–4.0)
MCH: 29.8 pg (ref 26.0–34.0)
MCHC: 33.4 g/dL (ref 30.0–36.0)
MCV: 89 fL (ref 80.0–100.0)
Monocytes Absolute: 0.6 10*3/uL (ref 0.1–1.0)
Monocytes Relative: 11 %
Neutro Abs: 3.4 10*3/uL (ref 1.7–7.7)
Neutrophils Relative %: 66 %
Platelet Count: 226 10*3/uL (ref 150–400)
RBC: 4.1 MIL/uL (ref 3.87–5.11)
RDW: 12.3 % (ref 11.5–15.5)
WBC Count: 5.2 10*3/uL (ref 4.0–10.5)
nRBC: 0 % (ref 0.0–0.2)

## 2022-10-29 LAB — RETICULOCYTES
Immature Retic Fract: 10.1 % (ref 2.3–15.9)
RBC.: 4.11 MIL/uL (ref 3.87–5.11)
Retic Count, Absolute: 59.2 10*3/uL (ref 19.0–186.0)
Retic Ct Pct: 1.4 % (ref 0.4–3.1)

## 2022-10-29 LAB — IRON AND IRON BINDING CAPACITY (CC-WL,HP ONLY)
Iron: 60 ug/dL (ref 28–170)
Saturation Ratios: 12 % (ref 10.4–31.8)
TIBC: 487 ug/dL — ABNORMAL HIGH (ref 250–450)
UIBC: 427 ug/dL (ref 148–442)

## 2022-10-29 LAB — FERRITIN: Ferritin: 15 ng/mL (ref 11–307)

## 2022-11-04 ENCOUNTER — Ambulatory Visit: Payer: Medicare Other | Admitting: Family

## 2022-11-04 ENCOUNTER — Inpatient Hospital Stay: Payer: Medicare Other

## 2022-12-04 ENCOUNTER — Encounter: Payer: Self-pay | Admitting: Family Medicine

## 2022-12-09 ENCOUNTER — Other Ambulatory Visit: Payer: Self-pay | Admitting: Family Medicine

## 2022-12-09 DIAGNOSIS — I10 Essential (primary) hypertension: Secondary | ICD-10-CM

## 2022-12-11 ENCOUNTER — Ambulatory Visit (INDEPENDENT_AMBULATORY_CARE_PROVIDER_SITE_OTHER): Payer: Medicare Other

## 2022-12-11 DIAGNOSIS — Z0000 Encounter for general adult medical examination without abnormal findings: Secondary | ICD-10-CM | POA: Diagnosis not present

## 2022-12-11 NOTE — Progress Notes (Signed)
Subjective:   Angela Gibbs is a 82 y.o. female who presents for Medicare Annual (Subsequent) preventive examination.  Visit Complete: Virtual I connected with  Joley M Hoge on 12/11/22 by a audio enabled telemedicine application and verified that I am speaking with the correct person using two identifiers.  Patient Location: Home  Provider Location: Office/Clinic  I discussed the limitations of evaluation and management by telemedicine. The patient expressed understanding and agreed to proceed.  Vital Signs: Because this visit was a virtual/telehealth visit, some criteria may be missing or patient reported. Any vitals not documented were not able to be obtained and vitals that have been documented are patient reported.   Cardiac Risk Factors include: advanced age (>81men, >26 women);hypertension;obesity (BMI >30kg/m2)     Objective:    Today's Vitals   12/11/22 1131  PainSc: 3    There is no height or weight on file to calculate BMI.     12/11/2022   11:41 AM 06/13/2022    1:26 PM 12/11/2021    1:29 PM 11/21/2021    2:28 PM 12/11/2020    1:44 PM 03/13/2020    2:18 PM 01/10/2020    1:48 PM  Advanced Directives  Does Patient Have a Medical Advance Directive? No No Yes Yes Yes Yes Yes  Type of Surveyor, minerals;Living will Healthcare Power of [place];Living will Healthcare Power of [place];Living will Healthcare Power of [place];Living will Healthcare Power of [place];Living will  Does patient want to make changes to medical advance directive?    No - Patient declined  No - Patient declined No - Patient declined  Copy of Healthcare Power of Attorney in Chart?   No - copy requested No - copy requested No - copy requested No - copy requested No - copy requested  Would patient like information on creating a medical advance directive? No - Patient declined          Current Medications (verified) Outpatient Encounter Medications as of  12/11/2022  Medication Sig   ALPRAZolam (XANAX) 0.25 MG tablet TAKE 1 TABLET(0.25 MG) BY MOUTH TWICE DAILY AS NEEDED FOR ANXIETY   Ascorbic Acid (VITAMIN C) 1000 MG tablet Take 1,000 mg by mouth daily.   Cholecalciferol 125 MCG (5000 UT) TABS Take 5,000 Units by mouth daily.   Cyanocobalamin (B-12 COMPLIANCE INJECTION) 1000 MCG/ML KIT Inject 1,000 mcg as directed every 30 (thirty) days.   cyclobenzaprine (FLEXERIL) 5 MG tablet Take 5 mg by mouth 2 (two) times daily as needed for muscle spasms.   diclofenac Sodium (VOLTAREN) 1 % GEL APPLY 2 GRAMS EXTERNALLY TO THE AFFECTED AREA FOUR TIMES DAILY   FLUoxetine (PROZAC) 20 MG capsule Take 1 capsule (20 mg total) by mouth daily.   fluticasone (FLONASE) 50 MCG/ACT nasal spray Place 2 sprays into both nostrils daily.   furosemide (LASIX) 20 MG tablet Take 1 tablet (20 mg total) by mouth daily. (Patient taking differently: Take 20 mg by mouth every other day.)   HYDROcodone-acetaminophen (NORCO/VICODIN) 5-325 MG tablet Take 1 tablet by mouth every 6 (six) hours as needed for pain.   MAGNESIUM GLYCINATE PO Take 50 mg by mouth daily.   montelukast (SINGULAIR) 10 MG tablet TAKE 1 TABLET(10 MG) BY MOUTH AT BEDTIME   nebivolol (BYSTOLIC) 10 MG tablet Take 1 tablet (10 mg total) by mouth daily.   omeprazole (PRILOSEC OTC) 20 MG tablet Take 20 mg by mouth daily.   Prednicarbate 0.1 % CREA Apply 1 Application topically  as needed for itching.   sodium chloride (V-R NASAL SPRAY SALINE) 0.65 % nasal spray Place 1 spray into the nose as needed. Use in both nostrils twice daily   spironolactone (ALDACTONE) 25 MG tablet TAKE 1/2 TABLET(12.5 MG) BY MOUTH DAILY   thyroid (ARMOUR) 30 MG tablet Take 30 mg by mouth daily.   valsartan (DIOVAN) 320 MG tablet Take 1 tablet (320 mg total) by mouth daily.   clindamycin (CLEOCIN) 150 MG capsule Take 450 mg by mouth 3 (three) times daily. 1 hour prior to dental procedures and 6 hours post dental procedure (Patient not taking:  Reported on 12/11/2022)   oseltamivir (TAMIFLU) 75 MG capsule Take 1 capsule (75 mg total) by mouth 2 (two) times daily. (Patient not taking: Reported on 07/30/2022)   No facility-administered encounter medications on file as of 12/11/2022.    Allergies (verified) Cephalosporins; Ciprofloxacin; Influenza virus vaccine; Keflex [cephalexin]; Latex; Levaquin [levofloxacin]; Nitrofuran derivatives; Penicillins; Sulfacetamide sodium; Xopenex [levalbuterol hcl]; Zostavax [zoster vaccine live]; Erythromycin; Hydrochlorothiazide; Lisinopril; Petrolatum; Pregabalin; Ropinirole hcl; Shellfish allergy; Sulfa antibiotics; Golimumab; Ibuprofen; Lactose; Aspirin; Avelox [moxifloxacin hcl in nacl]; Cefdinir; Chromium; Egg solids, whole; Egg-derived products; Iodinated contrast media; Lactose intolerance (gi); Percodan [oxycodone-aspirin]; Prednisone; and Troleandomycin   History: Past Medical History:  Diagnosis Date   Abdominal pain, acute, right upper quadrant 07/29/2013   Allergy    Body mass index (BMI) 31.0-31.9, adult 12/13/2021   Concussion    Current use of beta blocker 04/11/2016   Essential hypertension 02/20/2016   Falls    Gastroesophageal reflux disease without esophagitis 04/11/2016   Head injury    Herpes    IDA (iron deficiency anemia) 01/11/2020   Iron deficiency anemia due to chronic blood loss 12/28/2019   Migraines    Moderate persistent asthma without complication 05/13/2016   Nerve damage    Osteoarthritis 12/07/2019   Other allergic rhinitis 04/11/2016   Positive FIT (fecal immunochemical test) 12/28/2019   Psoriasis 12/07/2019   Psoriatic arthritis (HCC) 12/07/2019   Managed by rheumatology   Restrictive lung disease 05/13/2016   Related to scoliosis and hiatal hernia   Shingles    TIA (transient ischemic attack) 05/18/2019   Past Surgical History:  Procedure Laterality Date   BRAIN SURGERY     CRANIOTOMY FOR EXTRACRANIAL-INTRACRANIAL BYPASS     EYE SURGERY  4.4.17   milia  excision   HIP SURGERY     intracranial surgery   NERVE SURGERY     ROOT CANAL     TONSILLECTOMY     Family History  Problem Relation Age of Onset   Heart disease Father    Heart attack Father    Social History   Socioeconomic History   Marital status: Married    Spouse name: Not on file   Number of children: Not on file   Years of education: Not on file   Highest education level: Not on file  Occupational History   Not on file  Tobacco Use   Smoking status: Never   Smokeless tobacco: Never  Vaping Use   Vaping status: Never Used  Substance and Sexual Activity   Alcohol use: Yes    Comment: social   Drug use: No   Sexual activity: Yes  Other Topics Concern   Not on file  Social History Narrative   Not on file   Social Determinants of Health   Financial Resource Strain: Low Risk  (12/11/2022)   Overall Financial Resource Strain (CARDIA)    Difficulty of Paying  Living Expenses: Not hard at all  Food Insecurity: No Food Insecurity (12/11/2022)   Hunger Vital Sign    Worried About Running Out of Food in the Last Year: Never true    Ran Out of Food in the Last Year: Never true  Transportation Needs: No Transportation Needs (12/11/2022)   PRAPARE - Administrator, Civil Service (Medical): No    Lack of Transportation (Non-Medical): No  Physical Activity: Sufficiently Active (12/11/2022)   Exercise Vital Sign    Days of Exercise per Week: 3 days    Minutes of Exercise per Session: 60 min  Stress: No Stress Concern Present (12/11/2022)   [employer] of Occupational Health - Occupational Stress Questionnaire    Feeling of Stress : Not at all  Social Connections: Socially Integrated (12/11/2022)   Social Connection and Isolation Panel [NHANES]    Frequency of Communication with Friends and Family: More than three times a week    Frequency of Social Gatherings with Friends and Family: More than three times a week    Attends Religious Services: More  than 4 times per year    Active Member of [employer] or Organizations: Yes    Attends Engineer, structural: More than 4 times per year    Marital Status: Married    Tobacco Counseling Counseling given: Not Answered   Clinical Intake:  Pre-visit preparation completed: Yes  Pain : 0-10 Pain Score: 3  Pain Type: Chronic pain Pain Location: Hip Pain Orientation: Right Pain Descriptors / Indicators: Aching Pain Onset: More than a month ago Pain Frequency: Intermittent     Nutritional Status: BMI > 30  Obese Nutritional Risks: None Diabetes: No  How often do you need to have someone help you when you read instructions, pamphlets, or other written materials from your doctor or pharmacy?: 1 - Never  Interpreter Needed?: No  Information entered by :: Kennedy Bucker, LPN   Activities of Daily Living    12/11/2022   11:46 AM  In your present state of health, do you have any difficulty performing the following activities:  Hearing? 0  Vision? 0  Difficulty concentrating or making decisions? 0  Walking or climbing stairs? 1  Comment SHOB  Dressing or bathing? 0  Doing errands, shopping? 0  Preparing Food and eating ? N  Using the Toilet? N  In the past six months, have you accidently leaked urine? N  Do you have problems with loss of bowel control? N  Managing your Medications? N  Managing your Finances? N  Housekeeping or managing your Housekeeping? N    Patient Care Team: Copland, Gwenlyn Found, MD as PCP - General (Family Medicine) Blondell Reveal, MD as Consulting Physician (Obstetrics and Gynecology) Saintclair Halsted, OD as Consulting Physician (Ophthalmology) Hyacinth Meeker Cleda Mccreedy, MD as Consulting Physician (Neurology) Judie Bonus, NP as Consulting Physician (Pain Medicine) Vashti Hey, MD as Consulting Physician (Gastroenterology) Jeanann Lewandowsky, MD as Consulting Physician (Internal Medicine) Lenor Coffin., MD as Physician  Assistant (Pain Medicine)  Indicate any recent Medical Services you may have received from other than [facility] providers in the past year (date may be approximate).     Assessment:   This is a routine wellness examination for Angela Gibbs.  Hearing/Vision screen Hearing Screening - Comments:: No aids Vision Screening - Comments:: Wears glasses- Dr.Julia Ochenko   Goals Addressed             This Visit's Progress    DIET - EAT MORE  FRUITS AND VEGETABLES         Depression Screen    12/11/2022   11:38 AM 11/21/2021    2:38 PM 09/23/2021    2:03 PM 10/09/2020    3:26 PM 08/31/2019    9:38 AM 03/06/2017    3:53 PM 05/24/2015    2:43 PM  PHQ 2/9 Scores  PHQ - 2 Score 0 0 0 0 6 0 0  PHQ- 9 Score 0  0  21    Exception Documentation       Patient refusal    Fall Risk    12/11/2022   11:46 AM 11/21/2021    2:38 PM 10/09/2020    3:25 PM 01/12/2019   10:46 AM 03/06/2017    3:53 PM  Fall Risk   Falls in the past year? 0 0 0 0 Yes  Comment    Emmi Telephone Survey: data to providers prior to load   Number falls in past yr: 0 0 0  1  Injury with Fall? 0 0 0  No  Risk for fall due to : No Fall Risks No Fall Risks     Follow up Falls prevention discussed;Falls evaluation completed Falls evaluation completed Falls prevention discussed  Education provided;Falls prevention discussed    MEDICARE RISK AT HOME:    TIMED UP AND GO:  Was the test performed?  No    Cognitive Function:        11/21/2021    2:56 PM  6CIT Screen  What Year? 0 points  What month? 0 points  What time? 0 points  Count back from 20 0 points  Months in reverse 2 points  Repeat phrase 0 points  Total Score 2 points    Immunizations Immunization History  Administered Date(s) Administered   PFIZER(Purple Top)SARS-COV-2 Vaccination 03/09/2019, 03/30/2019   Pneumococcal Conjugate-13 11/10/2013, 02/20/2016   Pneumococcal Polysaccharide-23 01/24/2019   Td 06/11/2016   Tdap 02/17/2006    TDAP status: Up to  date  Flu Vaccine status: Declined, Education has been provided regarding the importance of this vaccine but patient still declined. Advised may receive this vaccine at local pharmacy or Health Dept. Aware to provide a copy of the vaccination record if obtained from local pharmacy or Health Dept. Verbalized acceptance and understanding.  Pneumococcal vaccine status: Up to date  Covid-19 vaccine status: Completed vaccines  Qualifies for Shingles Vaccine? Yes   Zostavax completed No   Shingrix Completed?: No.    Education has been provided regarding the importance of this vaccine. Patient has been advised to call insurance company to determine out of pocket expense if they have not yet received this vaccine. Advised may also receive vaccine at local pharmacy or Health Dept. Verbalized acceptance and understanding.  Screening Tests Health Maintenance  Topic Date Due   Zoster Vaccines- Shingrix (1 of 2) Never done   COVID-19 Vaccine (3 - Pfizer risk series) 04/27/2019   Medicare Annual Wellness (AWV)  12/11/2023   DTaP/Tdap/Td (3 - Td or Tdap) 06/12/2026   Pneumonia Vaccine 74+ Years old  Completed   DEXA SCAN  Completed   HPV VACCINES  Aged Out   Hepatitis C Screening  Discontinued    Health Maintenance  Health Maintenance Due  Topic Date Due   Zoster Vaccines- Shingrix (1 of 2) Never done   COVID-19 Vaccine (3 - Pfizer risk series) 04/27/2019    Colorectal cancer screening: No longer required.   Mammogram status: No longer required due to age.  Bone  Density status: Completed 08/11/22. Results reflect: Bone density results: NORMAL. Repeat every 5 years.  Lung Cancer Screening: (Low Dose CT Chest recommended if Age 88-80 years, 20 pack-year currently smoking OR have quit w/in 15years.) does not qualify.    Additional Screening:  Hepatitis C Screening: does not qualify; Completed 05/26/19  Vision Screening: Recommended annual ophthalmology exams for early detection of glaucoma  and other disorders of the eye. Is the patient up to date with their annual eye exam?  Yes  Who is the provider or what is the name of the office in which the patient attends annual eye exams? Dr.Ochenko If pt is not established with a provider, would they like to be referred to a provider to establish care? No .   Dental Screening: Recommended annual dental exams for proper oral hygiene    Community Resource Referral / Chronic Care Management: CRR required this visit?  No   CCM required this visit?  No     Plan:     I have personally reviewed and noted the following in the patient's chart:   Medical and social history Use of alcohol, tobacco or illicit drugs  Current medications and supplements including opioid prescriptions. Patient is not currently taking opioid prescriptions. Functional ability and status Nutritional status Physical activity Advanced directives List of other physicians Hospitalizations, surgeries, and ER visits in previous 12 months Vitals Screenings to include cognitive, depression, and falls Referrals and appointments  In addition, I have reviewed and discussed with patient certain preventive protocols, quality metrics, and best practice recommendations. A written personalized care plan for preventive services as well as general preventive health recommendations were provided to patient.     Hal Hope, LPN   [id number]   After Visit Summary: (MyChart) Due to this being a telephonic visit, the after visit summary with patients personalized plan was offered to patient via MyChart   Nurse Notes: none

## 2022-12-11 NOTE — Patient Instructions (Addendum)
Angela Gibbs , Thank you for taking time to come for your Medicare Wellness Visit. I appreciate your ongoing commitment to your health goals. Please review the following plan we discussed and let me know if I can assist you in the future.   Referrals/Orders/Follow-Ups/Clinician Recommendations: none  This is a list of the screening recommended for you and due dates:  Health Maintenance  Topic Date Due   Zoster (Shingles) Vaccine (1 of 2) Never done   COVID-19 Vaccine (3 - Pfizer risk series) 04/27/2019   Medicare Annual Wellness Visit  12/11/2023   DTaP/Tdap/Td vaccine (3 - Td or Tdap) 06/12/2026   Pneumonia Vaccine  Completed   DEXA scan (bone density measurement)  Completed   HPV Vaccine  Aged Out   Hepatitis C Screening  Discontinued    Advanced directives: (ACP Link)Information on Advanced Care Planning can be found at [facility] [place] Advance Health Care Directives Advance Health Care Directives ([email or web address]   Next Medicare Annual Wellness Visit scheduled for next year: Yes   12/15/23 @ 11:00 am by phone

## 2022-12-13 NOTE — Progress Notes (Unsigned)
[facility] [address] [place], [place] [address] [phone]  Date:  12/15/2022   Name:  Angela Gibbs   DOB:  [birth year]   MRN:  [id number]  PCP:  Pearline Cables, MD    Chief Complaint: No chief complaint on file.   History of Present Illness:  Angela Gibbs is a 82 y.o. very pleasant female patient who presents with the following:  Patient seen today with concern of "pulmonary issues" Most recent visit with myself was in June: history of TIA, restrictive lung disease due to scoliosis and hiatal hernia, psoriatic arthritis, hypertension, iron deficiency anemia, hypothyroidism  She recently sent me a MyChart message: Dr. Dallas Schimke, I am having a terrible time with my stamina. My oxygen level is normally 98% yet if I walk on level ground for 50 feet I'm exhausted, labored breathing etc. Really need some help. I need an appointment with a great pulmonologist.   Flu shot COVID booster Shingrix    Patient Active Problem List   Diagnosis Date Noted   Osteopenia 08/12/2022   Dyspnea on exertion 12/25/2021   Obesity (BMI 30.0-34.9) 12/25/2021   Allergy 12/13/2021   Concussion 12/13/2021   Falls 12/13/2021   Head injury 12/13/2021   Herpes 12/13/2021   Migraines 12/13/2021   Nerve damage 12/13/2021   Shingles 12/13/2021   Body mass index (BMI) 31.0-31.9, adult 12/13/2021   IDA (iron deficiency anemia) 01/11/2020   Positive FIT (fecal immunochemical test) 12/28/2019   Iron deficiency anemia due to chronic blood loss 12/28/2019   Psoriatic arthritis (HCC) 12/07/2019   Psoriasis 12/07/2019   Osteoarthritis 12/07/2019   TIA (transient ischemic attack) 05/18/2019   Restrictive lung disease 05/13/2016   Moderate persistent asthma without complication 05/13/2016   Current use of beta blocker 04/11/2016   Other allergic rhinitis 04/11/2016   Gastroesophageal reflux disease without esophagitis 04/11/2016    Essential hypertension 02/20/2016   Abdominal pain, acute, right upper quadrant 07/29/2013    Past Medical History:  Diagnosis Date   Abdominal pain, acute, right upper quadrant 07/29/2013   Allergy    Body mass index (BMI) 31.0-31.9, adult 12/13/2021   Concussion    Current use of beta blocker 04/11/2016   Essential hypertension 02/20/2016   Falls    Gastroesophageal reflux disease without esophagitis 04/11/2016   Head injury    Herpes    IDA (iron deficiency anemia) 01/11/2020   Iron deficiency anemia due to chronic blood loss 12/28/2019   Migraines    Moderate persistent asthma without complication 05/13/2016   Nerve damage    Osteoarthritis 12/07/2019   Other allergic rhinitis 04/11/2016   Positive FIT (fecal immunochemical test) 12/28/2019   Psoriasis 12/07/2019   Psoriatic arthritis (HCC) 12/07/2019   Managed by rheumatology   Restrictive lung disease 05/13/2016   Related to scoliosis and hiatal hernia   Shingles    TIA (transient ischemic attack) 05/18/2019    Past Surgical History:  Procedure Laterality Date   BRAIN SURGERY     CRANIOTOMY FOR EXTRACRANIAL-INTRACRANIAL BYPASS     EYE SURGERY  4.4.17   milia excision   HIP SURGERY     intracranial surgery   NERVE SURGERY     ROOT CANAL     TONSILLECTOMY      Social History   Tobacco Use   Smoking status: Never   Smokeless tobacco: Never  Vaping Use   Vaping status: Never Used  Substance  Use Topics   Alcohol use: Yes    Comment: social   Drug use: No    Family History  Problem Relation Age of Onset   Heart disease Father    Heart attack Father     Allergies  Allergen Reactions   Cephalosporins Shortness Of Breath    Breathing difficulty   Ciprofloxacin Hives and Shortness Of Breath   Influenza Virus Vaccine Other (See Comments)    Numbness in legs   Keflex [Cephalexin] Hives   Latex Hives, Itching and Rash   Levaquin [Levofloxacin] Hives, Palpitations and Other (See Comments)    Chest pain    Nitrofuran Derivatives Hives, Itching and Swelling   Penicillins Hives   Sulfacetamide Sodium Hives   Xopenex [Levalbuterol Hcl] Anxiety and Other (See Comments)    Tremors with violent body chattering and shaking   Zostavax [Zoster Vaccine Live] Other (See Comments)    Pt has been told by specialists not to receive vaccine d/t h/o herpes in bloodstream and shingles.   Erythromycin Other (See Comments)    "stomach issues"   Hydrochlorothiazide Other (See Comments)    Migraine   Lisinopril Cough   Petrolatum Nausea Only   Pregabalin Other (See Comments)   Ropinirole Hcl Nausea And Vomiting   Shellfish Allergy Hives   Sulfa Antibiotics Hives   Golimumab     Other reaction(s): ?felt poorly   Ibuprofen     Other reaction(s): stomach   Lactose Other (See Comments)    "stomach issues"  Other Reaction(s): GI Intolerance   Aspirin Other (See Comments)    Stomach pain/burning   Avelox [Moxifloxacin Hcl In Nacl] Nausea Only   Cefdinir Nausea And Vomiting   Chromium Other (See Comments)    Blisters   Egg Solids, Whole Other (See Comments)    Per allergy testing, no known reaction. Pt states she was told she could probably eat 2-3 eggs weekly w/o issue.   Egg-Derived Products     Per allergy testing, no known reaction. Pt states she was told she could probably eat 2-3 eggs weekly w/o issue.   Iodinated Contrast Media Other (See Comments)    unknown    Lactose Intolerance (Gi) Other (See Comments)    "stomach issues"   Percodan [Oxycodone-Aspirin] Nausea And Vomiting   Prednisone Palpitations    No injections, can do the pills   Troleandomycin Other (See Comments)    Unknown reaction    Medication list has been reviewed and updated.  Current Outpatient Medications on File Prior to Visit  Medication Sig Dispense Refill   ALPRAZolam (XANAX) 0.25 MG tablet TAKE 1 TABLET(0.25 MG) BY MOUTH TWICE DAILY AS NEEDED FOR ANXIETY 30 tablet 0   Ascorbic Acid (VITAMIN C) 1000 MG tablet Take  1,000 mg by mouth daily.     Cholecalciferol 125 MCG (5000 UT) TABS Take 5,000 Units by mouth daily.     clindamycin (CLEOCIN) 150 MG capsule Take 450 mg by mouth 3 (three) times daily. 1 hour prior to dental procedures and 6 hours post dental procedure (Patient not taking: Reported on 12/11/2022)     Cyanocobalamin (B-12 COMPLIANCE INJECTION) 1000 MCG/ML KIT Inject 1,000 mcg as directed every 30 (thirty) days.     cyclobenzaprine (FLEXERIL) 5 MG tablet Take 5 mg by mouth 2 (two) times daily as needed for muscle spasms.     diclofenac Sodium (VOLTAREN) 1 % GEL APPLY 2 GRAMS EXTERNALLY TO THE AFFECTED AREA FOUR TIMES DAILY 100 g 0   FLUoxetine (  PROZAC) 20 MG capsule Take 1 capsule (20 mg total) by mouth daily. 90 capsule 1   fluticasone (FLONASE) 50 MCG/ACT nasal spray Place 2 sprays into both nostrils daily. 16 g 5   furosemide (LASIX) 20 MG tablet Take 1 tablet (20 mg total) by mouth daily. (Patient taking differently: Take 20 mg by mouth every other day.) 90 tablet 3   HYDROcodone-acetaminophen (NORCO/VICODIN) 5-325 MG tablet Take 1 tablet by mouth every 6 (six) hours as needed for pain.     MAGNESIUM GLYCINATE PO Take 50 mg by mouth daily.     montelukast (SINGULAIR) 10 MG tablet TAKE 1 TABLET(10 MG) BY MOUTH AT BEDTIME 30 tablet 2   nebivolol (BYSTOLIC) 10 MG tablet Take 1 tablet (10 mg total) by mouth daily. 90 tablet 0   omeprazole (PRILOSEC OTC) 20 MG tablet Take 20 mg by mouth daily.     oseltamivir (TAMIFLU) 75 MG capsule Take 1 capsule (75 mg total) by mouth 2 (two) times daily. (Patient not taking: Reported on 07/30/2022) 10 capsule 0   Prednicarbate 0.1 % CREA Apply 1 Application topically as needed for itching.     sodium chloride (V-R NASAL SPRAY SALINE) 0.65 % nasal spray Place 1 spray into the nose as needed. Use in both nostrils twice daily 30 mL 6   spironolactone (ALDACTONE) 25 MG tablet TAKE 1/2 TABLET(12.5 MG) BY MOUTH DAILY 30 tablet 3   thyroid (ARMOUR) 30 MG tablet Take 30 mg  by mouth daily.     valsartan (DIOVAN) 320 MG tablet Take 1 tablet (320 mg total) by mouth daily. 90 tablet 3   No current facility-administered medications on file prior to visit.    Review of Systems:  As per HPI- otherwise negative.   Physical Examination: There were no vitals filed for this visit. There were no vitals filed for this visit. There is no height or weight on file to calculate BMI. Ideal Body Weight:    GEN: no acute distress. HEENT: Atraumatic, Normocephalic.  Ears and Nose: No external deformity. CV: RRR, No M/G/R. No JVD. No thrill. No extra heart sounds. PULM: CTA B, no wheezes, crackles, rhonchi. No retractions. No resp. distress. No accessory muscle use. ABD: S, NT, ND, +BS. No rebound. No HSM. EXTR: No c/c/e PSYCH: Normally interactive. Conversant.    Assessment and Plan: ***  Signed [name], MD

## 2022-12-15 ENCOUNTER — Ambulatory Visit (INDEPENDENT_AMBULATORY_CARE_PROVIDER_SITE_OTHER): Payer: Medicare Other | Admitting: Family Medicine

## 2022-12-15 ENCOUNTER — Encounter: Payer: Self-pay | Admitting: Family Medicine

## 2022-12-15 VITALS — BP 142/80 | HR 60 | Resp 18 | Ht 63.0 in | Wt 181.4 lb

## 2022-12-15 DIAGNOSIS — M419 Scoliosis, unspecified: Secondary | ICD-10-CM

## 2022-12-15 DIAGNOSIS — R0602 Shortness of breath: Secondary | ICD-10-CM | POA: Diagnosis not present

## 2022-12-16 ENCOUNTER — Telehealth: Payer: Self-pay | Admitting: Pulmonary Disease

## 2022-12-16 NOTE — Telephone Encounter (Signed)
Can we please offer her next available appointment with me or APP please?

## 2022-12-16 NOTE — Telephone Encounter (Signed)
-----   Message from [place] Copland sent at 12/15/2022  4:37 PM EDT ----- Hi Drs-I saw Angela Gibbs in follow-up today.  She notes worsening of her shortness of breath over the last 6 months but has fallen behind following up with you both  Aedan Geimer-would you be so kind as to bring her in for follow-up?  I believe her last visit was about 2 years ago  Rajan-of course we are also concerned shortness of breath could be due to her heart.  It looks like you ordered CT FFR coronary evaluation last year but she never did this due to concern about a dye allergy.  I told her I would touch base with you about any alternative testing that might be appropriate  Thanks to you both!    Jess

## 2022-12-21 ENCOUNTER — Other Ambulatory Visit: Payer: Self-pay | Admitting: Family Medicine

## 2022-12-21 DIAGNOSIS — I10 Essential (primary) hypertension: Secondary | ICD-10-CM

## 2022-12-24 DIAGNOSIS — M2550 Pain in unspecified joint: Secondary | ICD-10-CM | POA: Insufficient documentation

## 2022-12-24 DIAGNOSIS — R5383 Other fatigue: Secondary | ICD-10-CM | POA: Insufficient documentation

## 2022-12-24 DIAGNOSIS — M3500 Sicca syndrome, unspecified: Secondary | ICD-10-CM | POA: Insufficient documentation

## 2022-12-24 DIAGNOSIS — I890 Lymphedema, not elsewhere classified: Secondary | ICD-10-CM | POA: Insufficient documentation

## 2022-12-24 DIAGNOSIS — M064 Inflammatory polyarthropathy: Secondary | ICD-10-CM

## 2022-12-24 DIAGNOSIS — L309 Dermatitis, unspecified: Secondary | ICD-10-CM | POA: Insufficient documentation

## 2022-12-24 HISTORY — DX: Other fatigue: R53.83

## 2022-12-24 HISTORY — DX: Pain in unspecified joint: M25.50

## 2022-12-24 HISTORY — DX: Sjogren syndrome, unspecified: M35.00

## 2022-12-24 HISTORY — DX: Dermatitis, unspecified: L30.9

## 2022-12-24 HISTORY — DX: Lymphedema, not elsewhere classified: I89.0

## 2022-12-24 HISTORY — DX: Inflammatory polyarthropathy: M06.4

## 2022-12-25 ENCOUNTER — Encounter: Payer: Self-pay | Admitting: Cardiology

## 2022-12-25 ENCOUNTER — Ambulatory Visit: Payer: Medicare Other | Attending: Cardiology | Admitting: Cardiology

## 2022-12-25 VITALS — BP 162/70 | HR 55 | Ht 62.6 in | Wt 182.0 lb

## 2022-12-25 DIAGNOSIS — R0609 Other forms of dyspnea: Secondary | ICD-10-CM | POA: Insufficient documentation

## 2022-12-25 DIAGNOSIS — I10 Essential (primary) hypertension: Secondary | ICD-10-CM | POA: Diagnosis not present

## 2022-12-25 DIAGNOSIS — E66811 Obesity, class 1: Secondary | ICD-10-CM | POA: Diagnosis not present

## 2022-12-25 DIAGNOSIS — R072 Precordial pain: Secondary | ICD-10-CM | POA: Insufficient documentation

## 2022-12-25 HISTORY — DX: Other forms of dyspnea: R06.09

## 2022-12-25 MED ORDER — PREDNISONE 50 MG PO TABS: ORAL_TABLET | ORAL | 0 refills | Status: DC

## 2022-12-25 NOTE — Patient Instructions (Signed)
Medication Instructions:  Your physician recommends that you continue on your current medications as directed. Please refer to the Current Medication list given to you today.   *If you need a refill on your cardiac medications before your next appointment, please call your pharmacy*   Lab Work: Your physician recommends that you have a BMP today in the office.   If you have labs (blood work) drawn today and your tests are completely normal, you will receive your results only by: MyChart Message (if you have MyChart) OR A paper copy in the mail If you have any lab test that is abnormal or we need to change your treatment, we will call you to review the results.   Testing/Procedures:   Your cardiac CT will be scheduled at one of the below locations:   [facility] at [facility] [address], [address] [place],  [place]  [address]  Main: [phone]   Please follow these instructions carefully (unless otherwise directed):  On the Night Before the Test: Be sure to Drink plenty of water. Do not consume any caffeinated/decaffeinated beverages or chocolate 12 hours prior to your test. Do not take any antihistamines 12 hours prior to your test. If the patient has contrast allergy: Patient will need a prescription for Prednisone and very clear instructions (as follows): Prednisone 50 mg - take 13 hours prior to test Take another Prednisone 50 mg 7 hours prior to test Take another Prednisone 50 mg 1 hour prior to test Take Benadryl 50 mg 1 hour prior to test Patient must complete all four doses of above prophylactic medications. Patient will need a ride after test due to Benadryl.  On the Day of the Test: Drink plenty of water until 1 hour prior to the test. Do not eat any food 1 hour prior to test. You may take your regular medications prior to the test.  HOLD Furosemide/Hydrochlorothiazide morning of the test. FEMALES- please wear  underwire-free bra if available, avoid dresses & tight clothing      After the Test: Drink plenty of water. After receiving IV contrast, you may experience a mild flushed feeling. This is normal. On occasion, you may experience a mild rash up to 24 hours after the test. This is not dangerous. If this occurs, you can take Benadryl 25 mg and increase your fluid intake. If you experience trouble breathing, this can be serious. If it is severe call 911 IMMEDIATELY. If it is mild, please call our office. If you take any of these medications: Glipizide/Metformin, Avandament, Glucavance, please do not take 48 hours after completing test unless otherwise instructed.  We will call to schedule your test 2-4 weeks out understanding that some insurance companies will need an authorization prior to the service being performed.   For non-scheduling related questions, please contact the cardiac imaging nurse navigator should you have any questions/concerns: Rockwell Alexandria, Cardiac Imaging Nurse Navigator Larey Brick, Cardiac Imaging Nurse Navigator [facility] Direct Office Dial: [phone]   For scheduling needs, including cancellations and rescheduling, please call [place], [phone].   Your next appointment:   6 month(s)  The format for your next appointment:   In Person  Provider:   Belva Crome, MD   Other Instructions Cardiac CT Angiogram A cardiac CT angiogram is a procedure to look at the heart and the area around the heart. It may be done to help find the cause of chest pains or other symptoms  of heart disease. During this procedure, a substance called contrast dye is injected into the blood vessels in the area to be checked. A large X-ray machine, called a CT scanner, then takes detailed pictures of the heart and the surrounding area. The procedure is also sometimes called a coronary CT angiogram, coronary artery scanning, or CTA. A cardiac CT angiogram  allows the health care provider to see how well blood is flowing to and from the heart. The health care provider will be able to see if there are any problems, such as: Blockage or narrowing of the coronary arteries in the heart. Fluid around the heart. Signs of weakness or disease in the muscles, valves, and tissues of the heart. Tell a health care provider about: Any allergies you have. This is especially important if you have had a previous allergic reaction to contrast dye. All medicines you are taking, including vitamins, herbs, eye drops, creams, and over-the-counter medicines. Any blood disorders you have. Any surgeries you have had. Any medical conditions you have. Whether you are pregnant or may be pregnant. Any anxiety disorders, chronic pain, or other conditions you have that may increase your stress or prevent you from lying still. What are the risks? Generally, this is a safe procedure. However, problems may occur, including: Bleeding. Infection. Allergic reactions to medicines or dyes. Damage to other structures or organs. Kidney damage from the contrast dye that is used. Increased risk of cancer from radiation exposure. This risk is low. Talk with your health care provider about: The risks and benefits of testing. How you can receive the lowest dose of radiation. What happens before the procedure? Wear comfortable clothing and remove any jewelry, glasses, dentures, and hearing aids. Follow instructions from your health care provider about eating and drinking. This may include: For 12 hours before the procedure -- avoid caffeine. This includes tea, coffee, soda, energy drinks, and diet pills. Drink plenty of water or other fluids that do not have caffeine in them. Being well hydrated can prevent complications. For 4-6 hours before the procedure -- stop eating and drinking. The contrast dye can cause nausea, but this is less likely if your stomach is empty. Ask your health  care provider about changing or stopping your regular medicines. This is especially important if you are taking diabetes medicines, blood thinners, or medicines to treat problems with erections (erectile dysfunction). What happens during the procedure?  Hair on your chest may need to be removed so that small sticky patches called electrodes can be placed on your chest. These will transmit information that helps to monitor your heart during the procedure. An IV will be inserted into one of your veins. You might be given a medicine to control your heart rate during the procedure. This will help to ensure that good images are obtained. You will be asked to lie on an exam table. This table will slide in and out of the CT machine during the procedure. Contrast dye will be injected into the IV. You might feel warm, or you may get a metallic taste in your mouth. You will be given a medicine called nitroglycerin. This will relax or dilate the arteries in your heart. The table that you are lying on will move into the CT machine tunnel for the scan. The person running the machine will give you instructions while the scans are being done. You may be asked to: Keep your arms above your head. Hold your breath. Stay very still, even if the table  is moving. When the scanning is complete, you will be moved out of the machine. The IV will be removed. The procedure may vary among health care providers and hospitals. What can I expect after the procedure? After your procedure, it is common to have: A metallic taste in your mouth from the contrast dye. A feeling of warmth. A headache from the nitroglycerin. Follow these instructions at home: Take over-the-counter and prescription medicines only as told by your health care provider. If you are told, drink enough fluid to keep your urine pale yellow. This will help to flush the contrast dye out of your body. Most people can return to their normal activities right  after the procedure. Ask your health care provider what activities are safe for you. It is up to you to get the results of your procedure. Ask your health care provider, or the department that is doing the procedure, when your results will be ready. Keep all follow-up visits as told by your health care provider. This is important. Contact a health care provider if: You have any symptoms of allergy to the contrast dye. These include: Shortness of breath. Rash or hives. A racing heartbeat. Summary A cardiac CT angiogram is a procedure to look at the heart and the area around the heart. It may be done to help find the cause of chest pains or other symptoms of heart disease. During this procedure, a large X-ray machine, called a CT scanner, takes detailed pictures of the heart and the surrounding area after a contrast dye has been injected into blood vessels in the area. Ask your health care provider about changing or stopping your regular medicines before the procedure. This is especially important if you are taking diabetes medicines, blood thinners, or medicines to treat erectile dysfunction. If you are told, drink enough fluid to keep your urine pale yellow. This will help to flush the contrast dye out of your body. This information is not intended to replace advice given to you by your health care provider. Make sure you discuss any questions you have with your health care provider. Document Revised: 09/29/2018 Document Reviewed: 09/29/2018 Elsevier Patient Education  2020 [employer].

## 2022-12-25 NOTE — Progress Notes (Signed)
Cardiology Office Note:    Date:  12/25/2022   ID:  [name], DOB 11/07/1940, MRN [id number]  PCP:  Pearline Cables, MD  Cardiologist:  Garwin Brothers, MD   Referring MD: Pearline Cables, MD    ASSESSMENT:    1. Essential hypertension   2. DOE (dyspnea on exertion)   3. Obesity (BMI 30.0-34.9)    PLAN:    In order of problems listed above:  Primary prevention stressed with the patient.  Importance of compliance with diet medication stressed and patient verbalized standing. Dyspnea on exertion: This is very concerning and getting worse so I recommended again CT coronary angiography with FFR.  I discussed the allergy protocol and benefits and risks and she vocalized understanding and is agreeable.  She also has history of asthma which could be a factor in dyspnea but we want to rule out any ischemic substrate. Essential hypertension: Blood pressure stable and diet was emphasized.  I rechecked her blood pressure and it was 142/70.  The patient mentions to me that her blood pressure is fine at home and she has an element of whitecoat hypertension.  Lifestyle modification and diet and salt intake issues were discussed. Obesity: Weight reduction stressed diet emphasized and she promises to do better. Patient will be seen in follow-up appointment in 6 months or earlier if the patient has any concerns.    Medication Adjustments/Labs and Tests Ordered: Current medicines are reviewed at length with the patient today.  Concerns regarding medicines are outlined above.  Orders Placed This Encounter  Procedures   EKG 12-Lead   No orders of the defined types were placed in this encounter.    No chief complaint on file.    History of Present Illness:    Angela Gibbs is a 82 y.o. female.  Patient has past medical history of essential hypertension and dyspnea on exertion.  She mentions to me that her dyspnea on exertion is of significant concern to her.  She did not  undergo CT coronary angiography in the past because of her concern about dye allergy.  She has no chest pain orthopnea or PND.  But her exertion is limited by dyspnea.  At the time of my evaluation, the patient is alert awake oriented and in no distress.  Past Medical History:  Diagnosis Date   Abdominal pain, acute, right upper quadrant 07/29/2013   Allergy    Body mass index (BMI) 31.0-31.9, adult 12/13/2021   Cataracts, bilateral 06/06/2013   Chalazion of right upper eyelid 06/06/2014   Concussion    Current use of beta blocker 04/11/2016   Dyspnea on exertion 12/25/2021   Eczema 12/24/2022   Essential hypertension 02/20/2016   Eyelid lesion 06/06/2013   Falls    Fatigue 12/24/2022   Gastroesophageal reflux disease without esophagitis 04/11/2016   Head injury    Herpes    IDA (iron deficiency anemia) 01/11/2020   Inflammatory polyarthropathy (HCC) 12/24/2022   Iron deficiency anemia due to chronic blood loss 12/28/2019   Joint pain 12/24/2022   Lymphedema 12/24/2022   Migraines    Moderate persistent asthma without complication 05/13/2016   Nerve damage    Nuclear sclerosis of both eyes 06/06/2014   Obesity (BMI 30.0-34.9) 12/25/2021   Osteoarthritis 12/07/2019   Osteopenia 08/12/2022   Other allergic rhinitis 04/11/2016   Positive FIT (fecal immunochemical test) 12/28/2019   Psoriasis 12/07/2019   Psoriatic arthritis (HCC) 12/07/2019   Managed by rheumatology   PVD (posterior vitreous  detachment), both eyes 03/19/2014   Restrictive lung disease 05/13/2016   Related to scoliosis and hiatal hernia   Retinal tear of right eye 02/13/2014   Shingles    Sjogren syndrome, unspecified (HCC) 12/24/2022   TIA (transient ischemic attack) 05/18/2019    Past Surgical History:  Procedure Laterality Date   BRAIN SURGERY     CRANIOTOMY FOR EXTRACRANIAL-INTRACRANIAL BYPASS     EYE SURGERY  4.4.17   milia excision   HIP SURGERY     intracranial surgery   NERVE SURGERY      ROOT CANAL     TONSILLECTOMY      Current Medications: Current Meds  Medication Sig   ALPRAZolam (XANAX) 0.25 MG tablet TAKE 1 TABLET(0.25 MG) BY MOUTH TWICE DAILY AS NEEDED FOR ANXIETY   Ascorbic Acid (VITAMIN C) 1000 MG tablet Take 1,000 mg by mouth daily.   Cholecalciferol 125 MCG (5000 UT) TABS Take 5,000 Units by mouth daily.   clindamycin (CLEOCIN) 150 MG capsule Take 450 mg by mouth 3 (three) times daily. 1 hour prior to dental procedures and 6 hours post dental procedure   diclofenac Sodium (VOLTAREN) 1 % GEL APPLY 2 GRAMS EXTERNALLY TO THE AFFECTED AREA FOUR TIMES DAILY   FLUoxetine (PROZAC) 20 MG capsule Take 1 capsule (20 mg total) by mouth daily.   MAGNESIUM GLYCINATE PO Take 50 mg by mouth daily.   montelukast (SINGULAIR) 10 MG tablet TAKE 1 TABLET(10 MG) BY MOUTH AT BEDTIME   nebivolol (BYSTOLIC) 10 MG tablet Take 1 tablet (10 mg total) by mouth daily.   omeprazole (PRILOSEC OTC) 20 MG tablet Take 20 mg by mouth daily.   Prednicarbate 0.1 % CREA Apply 1 Application topically as needed for itching.   sodium chloride (V-R NASAL SPRAY SALINE) 0.65 % nasal spray Place 1 spray into the nose as needed. Use in both nostrils twice daily   spironolactone (ALDACTONE) 25 MG tablet TAKE 1/2 TABLET(12.5 MG) BY MOUTH DAILY   thyroid (ARMOUR) 30 MG tablet Take 30 mg by mouth daily.   valsartan (DIOVAN) 320 MG tablet TAKE 1 TABLET(320 MG) BY MOUTH DAILY     Allergies:   Cephalosporins; Ciprofloxacin; Influenza virus vaccine; Keflex [cephalexin]; Latex; Levaquin [levofloxacin]; Nitrofuran derivatives; Penicillins; Sulfacetamide sodium; Xopenex [levalbuterol hcl]; Zostavax [zoster vaccine live]; Erythromycin; Hydrochlorothiazide; Lisinopril; Petrolatum; Pregabalin; Ropinirole hcl; Shellfish allergy; Sulfa antibiotics; Golimumab; Ibuprofen; Lactose; Aspirin; Avelox [moxifloxacin hcl in nacl]; Cefdinir; Chromium; Egg solids, whole; Egg-derived products; Iodinated contrast media; Lactose intolerance  (gi); Percodan [oxycodone-aspirin]; Prednisone; and Troleandomycin   Social History   Socioeconomic History   Marital status: Married    Spouse name: Not on file   Number of children: Not on file   Years of education: Not on file   Highest education level: Not on file  Occupational History   Not on file  Tobacco Use   Smoking status: Never   Smokeless tobacco: Never  Vaping Use   Vaping status: Never Used  Substance and Sexual Activity   Alcohol use: Yes    Comment: social   Drug use: No   Sexual activity: Yes  Other Topics Concern   Not on file  Social History Narrative   Not on file   Social Determinants of Health   Financial Resource Strain: Low Risk  (12/11/2022)   Overall Financial Resource Strain (CARDIA)    Difficulty of Paying Living Expenses: Not hard at all  Food Insecurity: No Food Insecurity (12/11/2022)   Hunger Vital Sign    Worried  About Running Out of Food in the Last Year: Never true    Ran Out of Food in the Last Year: Never true  Transportation Needs: No Transportation Needs (12/11/2022)   PRAPARE - Administrator, Civil Service (Medical): No    Lack of Transportation (Non-Medical): No  Physical Activity: Sufficiently Active (12/11/2022)   Exercise Vital Sign    Days of Exercise per Week: 3 days    Minutes of Exercise per Session: 60 min  Stress: No Stress Concern Present (12/11/2022)   [employer] of Occupational Health - Occupational Stress Questionnaire    Feeling of Stress : Not at all  Social Connections: Socially Integrated (12/11/2022)   Social Connection and Isolation Panel [NHANES]    Frequency of Communication with Friends and Family: More than three times a week    Frequency of Social Gatherings with Friends and Family: More than three times a week    Attends Religious Services: More than 4 times per year    Active Member of [employer] or Organizations: Yes    Attends Engineer, structural: More than 4 times per  year    Marital Status: Married     Family History: The patient's family history includes Heart attack in her father; Heart disease in her father.  ROS:   Please see the history of present illness.    All other systems reviewed and are negative.  EKGs/Labs/Other Studies Reviewed:    The following studies were reviewed today: .Marland KitchenEKG Interpretation Date/Time:  Thursday December 25 2022 13:02:23 EST Ventricular Rate:  55 PR Interval:  160 QRS Duration:  78 QT Interval:  408 QTC Calculation: 390 R Axis:   84  Text Interpretation: Sinus bradycardia Nonspecific ST abnormality When compared with ECG of 18-May-2019 19:57, PREVIOUS ECG IS PRESENT Confirmed by Belva Crome [phone]) on 12/25/2022 1:15:06 PM     Recent Labs: 07/30/2022: ALT 18; BUN 15; Creatinine, Ser 0.66; Potassium 4.5; Sodium 133; TSH 2.43 10/29/2022: Hemoglobin 12.2; Platelet Count 226  Recent Lipid Panel    Component Value Date/Time   CHOL 193 07/30/2022 1342   TRIG 102.0 07/30/2022 1342   HDL 56.60 07/30/2022 1342   CHOLHDL 3 07/30/2022 1342   VLDL 20.4 07/30/2022 1342   LDLCALC 116 (H) 07/30/2022 1342    Physical Exam:    VS:  BP (!) 162/70   Pulse (!) 55   Ht 5' 2.6" (1.59 m)   Wt 182 lb 0.6 oz (82.6 kg)   SpO2 97%   BMI 32.66 kg/m     Wt Readings from Last 3 Encounters:  12/25/22 182 lb 0.6 oz (82.6 kg)  12/15/22 181 lb 6.4 oz (82.3 kg)  07/30/22 182 lb 12.8 oz (82.9 kg)     GEN: Patient is in no acute distress HEENT: Normal NECK: No JVD; No carotid bruits LYMPHATICS: No lymphadenopathy CARDIAC: Hear sounds regular, 2/6 systolic murmur at the apex. RESPIRATORY:  Clear to auscultation without rales, wheezing or rhonchi  ABDOMEN: Soft, non-tender, non-distended MUSCULOSKELETAL:  No edema; No deformity  SKIN: Warm and dry NEUROLOGIC:  Alert and oriented x 3 PSYCHIATRIC:  Normal affect   Signed, Garwin Brothers, MD  12/25/2022 1:32 PM    [facility]

## 2023-01-06 DIAGNOSIS — Z79899 Other long term (current) drug therapy: Secondary | ICD-10-CM | POA: Diagnosis not present

## 2023-01-06 DIAGNOSIS — Z5181 Encounter for therapeutic drug level monitoring: Secondary | ICD-10-CM | POA: Diagnosis not present

## 2023-01-19 ENCOUNTER — Telehealth (HOSPITAL_COMMUNITY): Payer: Self-pay | Admitting: Emergency Medicine

## 2023-01-19 DIAGNOSIS — R0609 Other forms of dyspnea: Secondary | ICD-10-CM | POA: Diagnosis not present

## 2023-01-19 NOTE — Telephone Encounter (Signed)
Attempted to call patient regarding upcoming cardiac CT appointment. °Left message on voicemail with name and callback number °Dwan Fennel RN Navigator Cardiac Imaging °[facility] °[phone] Office °[phone] Cell ° °

## 2023-01-20 ENCOUNTER — Ambulatory Visit (HOSPITAL_COMMUNITY)
Admission: RE | Admit: 2023-01-20 | Discharge: 2023-01-20 | Disposition: A | Discharge status: Home / Self Care | Payer: Medicare Other | Source: Ambulatory Visit | Attending: Cardiology | Admitting: Cardiology

## 2023-01-20 DIAGNOSIS — R072 Precordial pain: Secondary | ICD-10-CM | POA: Insufficient documentation

## 2023-01-20 DIAGNOSIS — I700 Atherosclerosis of aorta: Secondary | ICD-10-CM

## 2023-01-20 DIAGNOSIS — R0609 Other forms of dyspnea: Secondary | ICD-10-CM | POA: Insufficient documentation

## 2023-01-20 LAB — BASIC METABOLIC PANEL
BUN/Creatinine Ratio: 21 (ref 12–28)
BUN: 15 mg/dL (ref 8–27)
CO2: 20 mmol/L (ref 20–29)
Calcium: 9 mg/dL (ref 8.7–10.3)
Chloride: 98 mmol/L (ref 96–106)
Creatinine, Ser: 0.73 mg/dL (ref 0.57–1.00)
Glucose: 96 mg/dL (ref 70–99)
Potassium: 4.6 mmol/L (ref 3.5–5.2)
Sodium: 135 mmol/L (ref 134–144)
eGFR: 82 mL/min/{1.73_m2} (ref >59)

## 2023-01-20 MED ORDER — IOHEXOL 350 MG/ML SOLN
95.0000 mL | Freq: Once | INTRAVENOUS | Status: AC | PRN
  Administered 2023-01-20: 95 mL via INTRAVENOUS

## 2023-01-20 MED ORDER — NITROGLYCERIN 0.4 MG SL SUBL
SUBLINGUAL_TABLET | SUBLINGUAL | Status: AC
  Filled 2023-01-20: qty 2

## 2023-01-20 MED ORDER — NITROGLYCERIN 0.4 MG SL SUBL
0.8000 mg | SUBLINGUAL_TABLET | Freq: Once | SUBLINGUAL | Status: AC
  Administered 2023-01-20: 0.8 mg via SUBLINGUAL

## 2023-01-21 ENCOUNTER — Telehealth: Payer: Self-pay

## 2023-01-21 ENCOUNTER — Encounter: Payer: Self-pay | Admitting: Cardiology

## 2023-01-21 DIAGNOSIS — I700 Atherosclerosis of aorta: Secondary | ICD-10-CM | POA: Insufficient documentation

## 2023-01-21 HISTORY — DX: Atherosclerosis of aorta: I70.0

## 2023-01-21 NOTE — Telephone Encounter (Signed)
-----   Message from Milan R Revankar sent at 01/21/2023  2:57 PM EST ----- Calcium score is 0 but she has aortic atherosclerosis.  Please bring her back for Chem-7 liver lipid check.  She needs to be initiated on statin.  She needs to call us back in 2 weeks if she has not heard from [place] about the extracardiac radiology report.  Copy primary Garwin Brothers, MD 01/21/2023 2:56 PM

## 2023-01-21 NOTE — Telephone Encounter (Signed)
MyChart message

## 2023-01-23 ENCOUNTER — Other Ambulatory Visit: Payer: Self-pay | Admitting: Family Medicine

## 2023-01-23 ENCOUNTER — Telehealth: Payer: Self-pay

## 2023-01-23 ENCOUNTER — Encounter: Payer: Self-pay | Admitting: Cardiology

## 2023-01-23 DIAGNOSIS — I700 Atherosclerosis of aorta: Secondary | ICD-10-CM

## 2023-01-23 LAB — HEPATIC FUNCTION PANEL
ALT: 18 [IU]/L (ref 0–32)
AST: 20 [IU]/L (ref 0–40)
Albumin: 3.9 g/dL (ref 3.7–4.7)
Alkaline Phosphatase: 86 [IU]/L (ref 44–121)
Bilirubin Total: 0.3 mg/dL (ref 0.0–1.2)
Bilirubin, Direct: 0.13 mg/dL (ref 0.00–0.40)
Total Protein: 6.5 g/dL (ref 6.0–8.5)

## 2023-01-23 LAB — LIPID PANEL
Chol/HDL Ratio: 2.5 {ratio} (ref 0.0–4.4)
Cholesterol, Total: 196 mg/dL (ref 100–199)
HDL: 78 mg/dL (ref >39)
LDL Chol Calc (NIH): 107 mg/dL — ABNORMAL HIGH (ref 0–99)
Triglycerides: 58 mg/dL (ref 0–149)
VLDL Cholesterol Cal: 11 mg/dL (ref 5–40)

## 2023-01-23 MED ORDER — ATORVASTATIN CALCIUM 10 MG PO TABS: 10.0000 mg | ORAL_TABLET | Freq: Every day | ORAL | 3 refills | Status: AC

## 2023-01-23 NOTE — Telephone Encounter (Signed)
Results reviewed with pt as per Dr. Revankar's note.  Pt verbalized understanding and had no additional questions. Routed to PCP.  

## 2023-01-23 NOTE — Telephone Encounter (Signed)
-----   Message from Dell Revankar sent at 01/23/2023  8:12 AM EST ----- Because of aortic atherosclerosis, better to start atorvastatin 10 mg daily and liver lipid check in 6 weeks if the patient is agreeable.  Copy primary Garwin Brothers, MD 01/23/2023 8:12 AM

## 2023-01-27 ENCOUNTER — Other Ambulatory Visit: Payer: Self-pay

## 2023-01-28 ENCOUNTER — Ambulatory Visit: Payer: Medicare Other | Attending: Cardiology | Admitting: Cardiology

## 2023-01-28 ENCOUNTER — Encounter: Payer: Self-pay | Admitting: Cardiology

## 2023-01-28 VITALS — BP 142/64 | HR 70 | Ht 62.6 in | Wt 184.0 lb

## 2023-01-28 DIAGNOSIS — I700 Atherosclerosis of aorta: Secondary | ICD-10-CM | POA: Diagnosis not present

## 2023-01-28 DIAGNOSIS — E66811 Obesity, class 1: Secondary | ICD-10-CM | POA: Insufficient documentation

## 2023-01-28 DIAGNOSIS — E782 Mixed hyperlipidemia: Secondary | ICD-10-CM | POA: Diagnosis not present

## 2023-01-28 DIAGNOSIS — G459 Transient cerebral ischemic attack, unspecified: Secondary | ICD-10-CM | POA: Diagnosis not present

## 2023-01-28 DIAGNOSIS — R0602 Shortness of breath: Secondary | ICD-10-CM | POA: Diagnosis not present

## 2023-01-28 DIAGNOSIS — I10 Essential (primary) hypertension: Secondary | ICD-10-CM | POA: Diagnosis not present

## 2023-01-28 NOTE — Progress Notes (Signed)
Cardiology Office Note:    Date:  01/28/2023   ID:  Angela Gibbs, DOB 26-Nov-1940, MRN [id number]  PCP:  Pearline Cables, MD  Cardiologist:  Garwin Brothers, MD   Referring MD: Pearline Cables, MD    ASSESSMENT:    1. Aortic atherosclerosis (HCC)   2. Essential hypertension   3. TIA (transient ischemic attack)   4. Obesity (BMI 30.0-34.9)   5. Mixed dyslipidemia    PLAN:    In order of problems listed above:  Dyspnea on exertion: Unclear etiology.  CT coronary angiography was unremarkable and she is happy to know the results.  She is seeing a pulmonologist tomorrow morning.  I would like to get a Chem-7 and a D-dimer to see if there are any issues concerning pulmonary thromboembolism.  She is agreeable. Mixed dyslipidemia: On lipid-lowering medications.  She is going to initiate this now.  Aortic atherosclerosis makes goal LDL to be less than 60. Aortic atherosclerosis: I discussed findings of CT scan with her at length and questions were answered to her satisfaction. Obesity: Weight reduction stressed.  Diet emphasized and she promises to do better.  She will be seen in follow-up appointment on annual basis.   Medication Adjustments/Labs and Tests Ordered: Current medicines are reviewed at length with the patient today.  Concerns regarding medicines are outlined above.  No orders of the defined types were placed in this encounter.  No orders of the defined types were placed in this encounter.    No chief complaint on file.    History of Present Illness:    Angela Gibbs is a 81 y.o. female.  Patient has past medical history of aortic atherosclerosis and a calcium score of 0, mixed dyslipidemia and obesity.  She leads a sedentary lifestyle.  She gives history of shortness of breath on exertion which is significant she tells me that this is happening since the past 4 years.  As I mentioned coronary angiography with CT scan was unremarkable.  She is here for  follow-up.  At the time of my evaluation, the patient is alert awake oriented and in no distress.  Past Medical History:  Diagnosis Date   Abdominal pain, acute, right upper quadrant 07/29/2013   Allergy    Aortic atherosclerosis (HCC) 01/21/2023   Body mass index (BMI) 31.0-31.9, adult 12/13/2021   Cataracts, bilateral 06/06/2013   Chalazion of right upper eyelid 06/06/2014   Concussion    Current use of beta blocker 04/11/2016   DOE (dyspnea on exertion) 12/25/2022   Dyspnea on exertion 12/25/2021   Eczema 12/24/2022   Essential hypertension 02/20/2016   Eyelid lesion 06/06/2013   Falls    Fatigue 12/24/2022   Gastroesophageal reflux disease without esophagitis 04/11/2016   Head injury    Herpes    IDA (iron deficiency anemia) 01/11/2020   Inflammatory polyarthropathy (HCC) 12/24/2022   Iron deficiency anemia due to chronic blood loss 12/28/2019   Joint pain 12/24/2022   Lymphedema 12/24/2022   Migraines    Moderate persistent asthma without complication 05/13/2016   Nerve damage    Nuclear sclerosis of both eyes 06/06/2014   Obesity (BMI 30.0-34.9) 12/25/2021   Osteoarthritis 12/07/2019   Osteopenia 08/12/2022   Other allergic rhinitis 04/11/2016   Positive FIT (fecal immunochemical test) 12/28/2019   Psoriasis 12/07/2019   Psoriatic arthritis (HCC) 12/07/2019   Managed by rheumatology   PVD (posterior vitreous detachment), both eyes 03/19/2014   Restrictive lung disease 05/13/2016   Related to  scoliosis and hiatal hernia   Retinal tear of right eye 02/13/2014   Shingles    Sjogren syndrome, unspecified (HCC) 12/24/2022   TIA (transient ischemic attack) 05/18/2019    Past Surgical History:  Procedure Laterality Date   BRAIN SURGERY     CRANIOTOMY FOR EXTRACRANIAL-INTRACRANIAL BYPASS     EYE SURGERY  4.4.17   milia excision   HIP SURGERY     intracranial surgery   NERVE SURGERY     ROOT CANAL     TONSILLECTOMY      Current Medications: Current Meds   Medication Sig   ALPRAZolam (XANAX) 0.25 MG tablet TAKE 1 TABLET(0.25 MG) BY MOUTH TWICE DAILY AS NEEDED FOR ANXIETY   Ascorbic Acid (VITAMIN C) 1000 MG tablet Take 1,000 mg by mouth daily.   atorvastatin (LIPITOR) 10 MG tablet Take 1 tablet (10 mg total) by mouth daily.   Cholecalciferol 125 MCG (5000 UT) TABS Take 5,000 Units by mouth daily.   clindamycin (CLEOCIN) 150 MG capsule Take 450 mg by mouth 3 (three) times daily. 1 hour prior to dental procedures and 6 hours post dental procedure   diclofenac Sodium (VOLTAREN) 1 % GEL APPLY 2 GRAMS EXTERNALLY TO THE AFFECTED AREA FOUR TIMES DAILY   FLUoxetine (PROZAC) 20 MG capsule TAKE 1 CAPSULE(20 MG) BY MOUTH DAILY   furosemide (LASIX) 20 MG tablet Take 20 mg by mouth daily.   MAGNESIUM GLYCINATE PO Take 50 mg by mouth daily.   montelukast (SINGULAIR) 10 MG tablet TAKE 1 TABLET(10 MG) BY MOUTH AT BEDTIME   nebivolol (BYSTOLIC) 10 MG tablet Take 1 tablet (10 mg total) by mouth daily.   omeprazole (PRILOSEC OTC) 20 MG tablet Take 20 mg by mouth daily.   Prednicarbate 0.1 % CREA Apply 1 Application topically as needed for itching.   sodium chloride (V-R NASAL SPRAY SALINE) 0.65 % nasal spray Place 1 spray into the nose as needed. Use in both nostrils twice daily   spironolactone (ALDACTONE) 25 MG tablet TAKE 1/2 TABLET(12.5 MG) BY MOUTH DAILY   thyroid (ARMOUR) 30 MG tablet Take 30 mg by mouth daily.   valsartan (DIOVAN) 320 MG tablet TAKE 1 TABLET(320 MG) BY MOUTH DAILY     Allergies:   Cephalosporins; Ciprofloxacin; Influenza virus vaccine; Keflex [cephalexin]; Latex; Levaquin [levofloxacin]; Nitrofuran derivatives; Penicillins; Sulfacetamide sodium; Xopenex [levalbuterol hcl]; Zostavax [zoster vaccine live]; Erythromycin; Hydrochlorothiazide; Lisinopril; Petrolatum; Pregabalin; Ropinirole hcl; Shellfish allergy; Sulfa antibiotics; Golimumab; Ibuprofen; Lactose; Aspirin; Avelox [moxifloxacin hcl in nacl]; Cefdinir; Chromium; Egg solids, whole;  Egg-derived products; Iodinated contrast media; Lactose intolerance (gi); Percodan [oxycodone-aspirin]; Prednisone; and Troleandomycin   Social History   Socioeconomic History   Marital status: Married    Spouse name: Not on file   Number of children: Not on file   Years of education: Not on file   Highest education level: Not on file  Occupational History   Not on file  Tobacco Use   Smoking status: Never   Smokeless tobacco: Never  Vaping Use   Vaping status: Never Used  Substance and Sexual Activity   Alcohol use: Yes    Comment: social   Drug use: No   Sexual activity: Yes  Other Topics Concern   Not on file  Social History Narrative   Not on file   Social Determinants of Health   Financial Resource Strain: Low Risk  (12/11/2022)   Overall Financial Resource Strain (CARDIA)    Difficulty of Paying Living Expenses: Not hard at all  Food Insecurity:  No Food Insecurity (12/11/2022)   Hunger Vital Sign    Worried About Running Out of Food in the Last Year: Never true    Ran Out of Food in the Last Year: Never true  Transportation Needs: No Transportation Needs (12/11/2022)   PRAPARE - Administrator, Civil Service (Medical): No    Lack of Transportation (Non-Medical): No  Physical Activity: Sufficiently Active (12/11/2022)   Exercise Vital Sign    Days of Exercise per Week: 3 days    Minutes of Exercise per Session: 60 min  Stress: No Stress Concern Present (12/11/2022)   [employer] of Occupational Health - Occupational Stress Questionnaire    Feeling of Stress : Not at all  Social Connections: Socially Integrated (12/11/2022)   Social Connection and Isolation Panel [NHANES]    Frequency of Communication with Friends and Family: More than three times a week    Frequency of Social Gatherings with Friends and Family: More than three times a week    Attends Religious Services: More than 4 times per year    Active Member of [employer] or Organizations: Yes     Attends Engineer, structural: More than 4 times per year    Marital Status: Married     Family History: The patient's family history includes Heart attack in her father; Heart disease in her father.  ROS:   Please see the history of present illness.    All other systems reviewed and are negative.  EKGs/Labs/Other Studies Reviewed:    The following studies were reviewed today: I discussed my findings with the patient at length.   Recent Labs: 07/30/2022: TSH 2.43 10/29/2022: Hemoglobin 12.2; Platelet Count 226 01/19/2023: BUN 15; Creatinine, Ser 0.73; Potassium 4.6; Sodium 135 01/22/2023: ALT 18  Recent Lipid Panel    Component Value Date/Time   CHOL 196 01/22/2023 1602   TRIG 58 01/22/2023 1602   HDL 78 01/22/2023 1602   CHOLHDL 2.5 01/22/2023 1602   CHOLHDL 3 07/30/2022 1342   VLDL 20.4 07/30/2022 1342   LDLCALC 107 (H) 01/22/2023 1602    Physical Exam:    VS:  BP (!) 142/64   Pulse 70   Ht 5' 2.6" (1.59 m)   Wt 184 lb (83.5 kg)   SpO2 97%   BMI 33.01 kg/m     Wt Readings from Last 3 Encounters:  01/28/23 184 lb (83.5 kg)  12/25/22 182 lb 0.6 oz (82.6 kg)  12/15/22 181 lb 6.4 oz (82.3 kg)     GEN: Patient is in no acute distress HEENT: Normal NECK: No JVD; No carotid bruits LYMPHATICS: No lymphadenopathy CARDIAC: Hear sounds regular, 2/6 systolic murmur at the apex. RESPIRATORY:  Clear to auscultation without rales, wheezing or rhonchi  ABDOMEN: Soft, non-tender, non-distended MUSCULOSKELETAL:  No edema; No deformity  SKIN: Warm and dry NEUROLOGIC:  Alert and oriented x 3 PSYCHIATRIC:  Normal affect   Signed, Garwin Brothers, MD  01/28/2023 2:38 PM    [facility]

## 2023-01-28 NOTE — Patient Instructions (Addendum)
Medication Instructions:  Your physician recommends that you continue on your current medications as directed. Please refer to the Current Medication list given to you today.  *If you need a refill on your cardiac medications before your next appointment, please call your pharmacy*   Lab Work: Your physician recommends that you have a BMP and D-Dimer today in the office.  If you have labs (blood work) drawn today and your tests are completely normal, you will receive your results only by: MyChart Message (if you have MyChart) OR A paper copy in the mail If you have any lab test that is abnormal or we need to change your treatment, we will call you to review the results.   Testing/Procedures: None ordered   Follow-Up: At [facility], you and your health needs are our priority.  As part of our continuing mission to provide you with exceptional heart care, we have created designated Provider Care Teams.  These Care Teams include your primary Cardiologist (physician) and Advanced Practice Providers (APPs -  Physician Assistants and Nurse Practitioners) who all work together to provide you with the care you need, when you need it.  We recommend signing up for the patient portal called "MyChart".  Sign up information is provided on this After Visit Summary.  MyChart is used to connect with patients for Virtual Visits (Telemedicine).  Patients are able to view lab/test results, encounter notes, upcoming appointments, etc.  Non-urgent messages can be sent to your provider as well.   To learn more about what you can do with MyChart, go to [email or web address].    Your next appointment:   12 month(s)  The format for your next appointment:   In Person  Provider:   Belva Crome, MD    Other Instructions none  Important Information About Sugar

## 2023-01-29 ENCOUNTER — Telehealth: Payer: Self-pay

## 2023-01-29 ENCOUNTER — Telehealth: Payer: Self-pay | Admitting: Pulmonary Disease

## 2023-01-29 ENCOUNTER — Ambulatory Visit (HOSPITAL_BASED_OUTPATIENT_CLINIC_OR_DEPARTMENT_OTHER): Payer: Medicare Other | Admitting: Pulmonary Disease

## 2023-01-29 DIAGNOSIS — E875 Hyperkalemia: Secondary | ICD-10-CM

## 2023-01-29 LAB — D-DIMER, QUANTITATIVE: D-DIMER: 0.26 mg{FEU}/L (ref 0.00–0.49)

## 2023-01-29 LAB — BASIC METABOLIC PANEL
BUN/Creatinine Ratio: 23 (ref 12–28)
BUN: 17 mg/dL (ref 8–27)
CO2: 24 mmol/L (ref 20–29)
Calcium: 9.1 mg/dL (ref 8.7–10.3)
Chloride: 98 mmol/L (ref 96–106)
Creatinine, Ser: 0.75 mg/dL (ref 0.57–1.00)
Glucose: 95 mg/dL (ref 70–99)
Potassium: 5.6 mmol/L — ABNORMAL HIGH (ref 3.5–5.2)
Sodium: 133 mmol/L — ABNORMAL LOW (ref 134–144)
eGFR: 79 mL/min/{1.73_m2} (ref >59)

## 2023-01-29 MED ORDER — SODIUM POLYSTYRENE SULFONATE PO POWD
Freq: Once | ORAL | 0 refills | Status: AC
Start: 2023-01-29 — End: 2023-01-29

## 2023-01-29 NOTE — Telephone Encounter (Signed)
Patient will not be able to make her appt today she fell and will need to be seen ASAP due to her having SOB

## 2023-01-29 NOTE — Telephone Encounter (Addendum)
Spoke with Greggory Stallion per DPR. Results reviewed with Greggory Stallion as per Dr. Kem Parkinson note.  Greggory Stallion verbalized understanding and had no additional questions. Routed to PCP. ----- Message from Aundra Dubin Revankar sent at 01/29/2023  8:16 AM EST ----- Kayexalate 15 g.  One-time dose.  Recheck in 1 week.  Copy primary care. Garwin Brothers, MD 01/29/2023 8:16 AM

## 2023-01-29 NOTE — Telephone Encounter (Signed)
Spoke with patients husband who asked for appt Monday. She has been moved and they have been notified to new time and [facility] location.

## 2023-01-31 ENCOUNTER — Encounter (INDEPENDENT_AMBULATORY_CARE_PROVIDER_SITE_OTHER): Payer: Medicare Other | Admitting: Family Medicine

## 2023-01-31 DIAGNOSIS — E875 Hyperkalemia: Secondary | ICD-10-CM

## 2023-02-01 DIAGNOSIS — E875 Hyperkalemia: Secondary | ICD-10-CM

## 2023-02-01 NOTE — Telephone Encounter (Signed)

## 2023-02-02 ENCOUNTER — Ambulatory Visit (HOSPITAL_BASED_OUTPATIENT_CLINIC_OR_DEPARTMENT_OTHER): Payer: Medicare Other | Admitting: Pulmonary Disease

## 2023-02-02 ENCOUNTER — Encounter (HOSPITAL_BASED_OUTPATIENT_CLINIC_OR_DEPARTMENT_OTHER): Payer: Self-pay | Admitting: Pulmonary Disease

## 2023-02-02 VITALS — BP 138/78 | HR 60 | Ht 62.6 in | Wt 184.0 lb

## 2023-02-02 DIAGNOSIS — I2609 Other pulmonary embolism with acute cor pulmonale: Secondary | ICD-10-CM

## 2023-02-02 DIAGNOSIS — M419 Scoliosis, unspecified: Secondary | ICD-10-CM | POA: Diagnosis not present

## 2023-02-02 DIAGNOSIS — J984 Other disorders of lung: Secondary | ICD-10-CM

## 2023-02-02 NOTE — Patient Instructions (Signed)
X ambulatory saturation  x sample of Spiriva Respimat 2 puffs daily  - increase Lasix to 40 mg daily for 3 to 5 days, goal weight loss of 5 pounds to 179  You have large hiatal hernia

## 2023-02-02 NOTE — Progress Notes (Signed)
Subjective:    Patient ID: Angela Gibbs, female    DOB: [birth year], 82 y.o.   MRN: [id number]  HPI  82 yo never smoker for FU  of restrictive lung disease    Restriction related to scoliosis.  She  benefited immensely from maintenance pulmonary rehab indicating element of deconditioning to her persistent dyspnea HRCT did not show any evidence of fibrotic ILD, mild air trapping is noted and mild bronchiectasis for which she was not symptomatic  PMH - scoliosis  -moderate hiatal hernia -hyponatremia -Psoriatec arthritis   She reports an allergic reaction to penicillin 2013  and onset of asthma since then. She denies childhood history of asthma.     She presented with shortness of breath in 2018 which improved after she discontinued fentanyl patch. Dyspnea recurred in 2019 and she was referred to pulmonary rehab, had dramatic improvement in her shortness of breath and she was able to walk 2 blocks. After her last visit 09/2019 I referred her to pulmonary rehab again  Last OV 12/2020  She again reports shortness of breath, worse for the last 3 months.  Accompanied by husband she has huffing and puffing after walking a few yards.  She was given albuterol and Xopenex by her PCP and this caused increased tremors that lasted all day.  She reports increased bipedal edema. PCP perform D-dimer testing which was negative On ambulation ordered increased from 64-85 and oxygen saturation stayed at 97 to 98% She pleated 2 laps and was significantly short of breath needing to stop several times due to fatigue  Reviewed CT coronaries and echo from 2023 showing moderate pulm hypertension   Significant tests/ events reviewed   CT cors 01/2023 large hiatal hernia, nonspecific lingular patchy density, stable nodules since 2021   HRCT 09/2019 Moderate patchy air trapping in both lungs, indicative of small airways disease. mild cylindrical bronchiectasis in both lungs.    echo 10/2021 >> RVSP 45  mm Spirometry 03/2016 showed a ratio of 81 with FEV1 of 55% and FVC of 51%   Spirometry 04/2016 and again showed a ratio of 89 with FEV1 of 54% and FVC of 45% again indicating moderate restriction Both these spirometry shows suboptimal efforts of 2-3 seconds  Review of Systems neg for any significant sore throat, dysphagia, itching, sneezing, nasal congestion or excess/ purulent secretions, fever, chills, sweats, unintended wt loss, pleuritic or exertional cp, hempoptysis, orthopnea pnd or change in chronic leg swelling. Also denies presyncope, palpitations, heartburn, abdominal pain, nausea, vomiting, diarrhea or change in bowel or urinary habits, dysuria,hematuria, rash, arthralgias, visual complaints, headache, numbness weakness or ataxia.     Objective:   Physical Exam  Gen. Pleasant, obese, in no distress ENT - no lesions, no post nasal drip Neck: No JVD, no thyromegaly, no carotid bruits Lungs: Kyphosis, no use of accessory muscles, no dullness to percussion, decreased without rales or rhonchi  Cardiovascular: Rhythm regular, heart sounds  normal, no murmurs or gallops, 1+ peripheral edema Musculoskeletal: No deformities, no cyanosis or clubbing , no tremors       Assessment & Plan:    I suspect that she has developed acute cor pulmonale due to significant restrictive lung disease -this is on the basis of scoliosis and large hiatal hernia -She does not desaturate on exertion. I will repeat echocardiogram. -Will diurese her with Lasix, she is on 20 mg, will increase this to 40 mg aiming for a weight loss of 5 pounds over the next 5 to 7 days  and see if this significantly improves shortness of breath.  This would be a therapeutic trial.  She also has a large hiatal hernia but denies significant GI symptoms so not sure if this is contributing. D-dimer was negative, so doubt we have to investigate for PE In the past she has had a good response to pulmonary rehab and if she were to  improve, we can consider enrolling her again  -She requests an inhaler.  She has not tolerated any form of albuterol in the past including Xopenex.  We will trial Spiriva, sample was provided.  I doubt that this is asthma but we can consider inhaled steroids in the future   Assessment:    1 or more chronic illnesses with severe exacerbation, progression, or side effects of treatment;   1 acute or chronic illness or injury that poses a threat to life or bodily function  Plan Following Extensive Data Review & Interpretation:   I reviewed prior external note(s) from cardiology  I reviewed the result(s) of blood work, CT scan and prior echo  I have ordered echocardiogram  Independent interpretation of tests  Review of patient's CT images revealed large hiatal hernia and scoliosis. The patient's images have been independently reviewed by me.    Discussion of management or test interpretation with another colleague PCP.

## 2023-02-03 NOTE — Telephone Encounter (Signed)
Unsure what to use for COPD severity in referral. Please advise or sign

## 2023-02-05 ENCOUNTER — Telehealth: Payer: Self-pay | Admitting: Pulmonary Disease

## 2023-02-05 DIAGNOSIS — J984 Other disorders of lung: Secondary | ICD-10-CM

## 2023-02-05 NOTE — Telephone Encounter (Signed)
Patient is asking for a cardiac rehab referral to send to [facility] in [place]   We could not find any places in [place], [place] or [place] for the pt to have pulmonary rehab per pt's request   Please advise

## 2023-02-15 ENCOUNTER — Encounter: Payer: Self-pay | Admitting: Family Medicine

## 2023-02-17 ENCOUNTER — Other Ambulatory Visit: Payer: Self-pay | Admitting: Family Medicine

## 2023-02-17 DIAGNOSIS — F410 Panic disorder [episodic paroxysmal anxiety] without agoraphobia: Secondary | ICD-10-CM

## 2023-02-17 LAB — BASIC METABOLIC PANEL
BUN/Creatinine Ratio: 21 (ref 12–28)
BUN: 15 mg/dL (ref 8–27)
CO2: 23 mmol/L (ref 20–29)
Calcium: 9.3 mg/dL (ref 8.7–10.3)
Chloride: 99 mmol/L (ref 96–106)
Creatinine, Ser: 0.72 mg/dL (ref 0.57–1.00)
Glucose: 86 mg/dL (ref 70–99)
Potassium: 5.4 mmol/L — ABNORMAL HIGH (ref 3.5–5.2)
Sodium: 133 mmol/L — ABNORMAL LOW (ref 134–144)
eGFR: 83 mL/min/{1.73_m2} (ref >59)

## 2023-02-19 ENCOUNTER — Other Ambulatory Visit (HOSPITAL_BASED_OUTPATIENT_CLINIC_OR_DEPARTMENT_OTHER): Payer: Self-pay | Admitting: Pulmonary Disease

## 2023-02-19 ENCOUNTER — Telehealth: Payer: Self-pay

## 2023-02-19 ENCOUNTER — Telehealth: Payer: Self-pay | Admitting: Cardiology

## 2023-02-19 DIAGNOSIS — I2609 Other pulmonary embolism with acute cor pulmonale: Secondary | ICD-10-CM

## 2023-02-19 DIAGNOSIS — J984 Other disorders of lung: Secondary | ICD-10-CM

## 2023-02-19 DIAGNOSIS — E875 Hyperkalemia: Secondary | ICD-10-CM

## 2023-02-19 DIAGNOSIS — R0602 Shortness of breath: Secondary | ICD-10-CM

## 2023-02-19 MED ORDER — HYDROCHLOROTHIAZIDE 12.5 MG PO CAPS: 12.5000 mg | ORAL_CAPSULE | Freq: Every day | ORAL | 12 refills | Status: DC

## 2023-02-19 MED ORDER — SODIUM POLYSTYRENE SULFONATE PO POWD: Freq: Once | ORAL | 0 refills | Status: AC

## 2023-02-19 NOTE — Telephone Encounter (Signed)
 MyChart message  Result message from Dr. Edwyna  Your potassium remains mildly elevated. Dr. Edwyna recommends you cut down spironolactone  dose to half and add hydrochlorothiazide  12.5 mg daily.  Keep a pulse and blood pressure log and Chem-7 in a week.  Keep diet low in potassium.

## 2023-02-19 NOTE — Telephone Encounter (Signed)
 Pt states that she has kayexalate at home from previous time. Pt also states she is willing to try hydrochlorothiazide and will let us know is she had migraines.

## 2023-02-19 NOTE — Telephone Encounter (Signed)
-----   Message from Jennifer SAUNDERS Revankar sent at 02/17/2023 11:02 AM EST ----- I am not sure whether this patient knows about the hypertension.  Patient needs to get Kayexalate .  I would like the patient to cut down spironolactone  dose to half and add hydrochlorothiazide  12.5 mg daily.  Pulse blood pressure and Chem-7 in a week.  Keep diet low in potassium.  Copy primary Jennifer SAUNDERS Crape, MD 02/17/2023 11:01 AM

## 2023-02-19 NOTE — Telephone Encounter (Signed)
 Pt c/o medication issue:  1. Name of Medication:   sodium polystyrene (KAYEXALATE ) powder    2. How are you currently taking this medication (dosage and times per day)? As written   3. Are you having a reaction (difficulty breathing--STAT)? No   4. What is your medication issue? Shana with [facility] pharmacy called in stating this powder only comes in a 454g container and they cannot open the container to give a smaller amount. Please advise if something else can sent over or written.

## 2023-03-01 ENCOUNTER — Encounter: Payer: Self-pay | Admitting: Family Medicine

## 2023-03-01 DIAGNOSIS — R195 Other fecal abnormalities: Secondary | ICD-10-CM

## 2023-03-02 NOTE — Addendum Note (Signed)
 Addended by: [name] C on: 03/02/2023 07:47 PM   Modules accepted: Orders

## 2023-03-03 ENCOUNTER — Other Ambulatory Visit: Payer: Medicare Other

## 2023-03-05 ENCOUNTER — Other Ambulatory Visit (INDEPENDENT_AMBULATORY_CARE_PROVIDER_SITE_OTHER): Payer: Medicare Other

## 2023-03-05 DIAGNOSIS — R195 Other fecal abnormalities: Secondary | ICD-10-CM | POA: Diagnosis not present

## 2023-03-06 ENCOUNTER — Observation Stay (HOSPITAL_COMMUNITY)
Admission: EM | Admit: 2023-03-06 | Discharge: 2023-03-07 | Discharge status: Against Medical Advice | Payer: Medicare Other | Attending: Emergency Medicine | Admitting: Emergency Medicine

## 2023-03-06 ENCOUNTER — Other Ambulatory Visit: Payer: Medicare Other

## 2023-03-06 ENCOUNTER — Other Ambulatory Visit (INDEPENDENT_AMBULATORY_CARE_PROVIDER_SITE_OTHER): Payer: Medicare Other

## 2023-03-06 ENCOUNTER — Encounter (HOSPITAL_COMMUNITY): Payer: Self-pay | Admitting: Internal Medicine

## 2023-03-06 ENCOUNTER — Telehealth: Payer: Self-pay

## 2023-03-06 ENCOUNTER — Other Ambulatory Visit: Payer: Self-pay

## 2023-03-06 DIAGNOSIS — K922 Gastrointestinal hemorrhage, unspecified: Principal | ICD-10-CM | POA: Diagnosis present

## 2023-03-06 DIAGNOSIS — E871 Hypo-osmolality and hyponatremia: Secondary | ICD-10-CM | POA: Diagnosis not present

## 2023-03-06 DIAGNOSIS — Z6833 Body mass index (BMI) 33.0-33.9, adult: Secondary | ICD-10-CM | POA: Diagnosis not present

## 2023-03-06 DIAGNOSIS — D649 Anemia, unspecified: Secondary | ICD-10-CM

## 2023-03-06 DIAGNOSIS — E669 Obesity, unspecified: Secondary | ICD-10-CM | POA: Diagnosis not present

## 2023-03-06 DIAGNOSIS — J45909 Unspecified asthma, uncomplicated: Secondary | ICD-10-CM | POA: Diagnosis not present

## 2023-03-06 DIAGNOSIS — R195 Other fecal abnormalities: Secondary | ICD-10-CM | POA: Diagnosis not present

## 2023-03-06 DIAGNOSIS — Z8673 Personal history of transient ischemic attack (TIA), and cerebral infarction without residual deficits: Secondary | ICD-10-CM | POA: Insufficient documentation

## 2023-03-06 DIAGNOSIS — Z91040 Latex allergy status: Secondary | ICD-10-CM | POA: Insufficient documentation

## 2023-03-06 DIAGNOSIS — Z79899 Other long term (current) drug therapy: Secondary | ICD-10-CM | POA: Diagnosis not present

## 2023-03-06 DIAGNOSIS — K920 Hematemesis: Secondary | ICD-10-CM | POA: Diagnosis not present

## 2023-03-06 DIAGNOSIS — D500 Iron deficiency anemia secondary to blood loss (chronic): Secondary | ICD-10-CM | POA: Diagnosis not present

## 2023-03-06 DIAGNOSIS — I10 Essential (primary) hypertension: Secondary | ICD-10-CM | POA: Diagnosis not present

## 2023-03-06 DIAGNOSIS — K921 Melena: Secondary | ICD-10-CM | POA: Diagnosis not present

## 2023-03-06 DIAGNOSIS — D62 Acute posthemorrhagic anemia: Secondary | ICD-10-CM | POA: Diagnosis not present

## 2023-03-06 LAB — CBC WITH DIFFERENTIAL/PLATELET
Abs Immature Granulocytes: 0.01 10*3/uL (ref 0.00–0.07)
Basophils Absolute: 0.1 10*3/uL (ref 0.0–0.1)
Basophils Relative: 1 %
Eosinophils Absolute: 0.1 10*3/uL (ref 0.0–0.5)
Eosinophils Relative: 2 %
HCT: 21.2 % — ABNORMAL LOW (ref 36.0–46.0)
Hemoglobin: 6.3 g/dL — CL (ref 12.0–15.0)
Immature Granulocytes: 0 %
Lymphocytes Relative: 10 %
Lymphs Abs: 0.6 10*3/uL — ABNORMAL LOW (ref 0.7–4.0)
MCH: 23.2 pg — ABNORMAL LOW (ref 26.0–34.0)
MCHC: 29.7 g/dL — ABNORMAL LOW (ref 30.0–36.0)
MCV: 77.9 fL — ABNORMAL LOW (ref 80.0–100.0)
Monocytes Absolute: 0.8 10*3/uL (ref 0.1–1.0)
Monocytes Relative: 13 %
Neutro Abs: 4.5 10*3/uL (ref 1.7–7.7)
Neutrophils Relative %: 74 %
Platelets: 279 10*3/uL (ref 150–400)
RBC: 2.72 MIL/uL — ABNORMAL LOW (ref 3.87–5.11)
RDW: 14.7 % (ref 11.5–15.5)
WBC: 6 10*3/uL (ref 4.0–10.5)
nRBC: 0 % (ref 0.0–0.2)

## 2023-03-06 LAB — POC OCCULT BLOOD, ED: Fecal Occult Bld: POSITIVE — AB

## 2023-03-06 LAB — COMPREHENSIVE METABOLIC PANEL
ALT: 14 U/L (ref 0–35)
ALT: 18 U/L (ref 0–44)
AST: 17 U/L (ref 0–37)
AST: 18 U/L (ref 15–41)
Albumin: 3.9 g/dL (ref 3.5–5.2)
Albumin: 3.6 g/dL (ref 3.5–5.0)
Alkaline Phosphatase: 67 U/L (ref 39–117)
Alkaline Phosphatase: 64 U/L (ref 38–126)
Anion gap: 6 (ref 5–15)
BUN: 18 mg/dL (ref 6–23)
BUN: 19 mg/dL (ref 8–23)
CO2: 25 meq/L (ref 19–32)
CO2: 25 mmol/L (ref 22–32)
Calcium: 8.7 mg/dL (ref 8.4–10.5)
Calcium: 8.7 mg/dL — ABNORMAL LOW (ref 8.9–10.3)
Chloride: 100 meq/L (ref 96–112)
Chloride: 100 mmol/L (ref 98–111)
Creatinine, Ser: 0.57 mg/dL (ref 0.40–1.20)
Creatinine, Ser: 0.71 mg/dL (ref 0.44–1.00)
GFR, Estimated: >60 mL/min (ref >60)
GFR: 84.58 mL/min (ref >60.00)
Glucose, Bld: 99 mg/dL (ref 70–99)
Glucose, Bld: 96 mg/dL (ref 70–99)
Potassium: 4.4 meq/L (ref 3.5–5.1)
Potassium: 4 mmol/L (ref 3.5–5.1)
Sodium: 132 meq/L — ABNORMAL LOW (ref 135–145)
Sodium: 131 mmol/L — ABNORMAL LOW (ref 135–145)
Total Bilirubin: 0.4 mg/dL (ref 0.2–1.2)
Total Bilirubin: 0.5 mg/dL (ref 0.0–1.2)
Total Protein: 6.2 g/dL (ref 6.0–8.3)
Total Protein: 6.2 g/dL — ABNORMAL LOW (ref 6.5–8.1)

## 2023-03-06 LAB — IRON AND TIBC
Iron: 21 ug/dL — ABNORMAL LOW (ref 28–170)
Saturation Ratios: 4 % — ABNORMAL LOW (ref 10.4–31.8)
TIBC: 582 ug/dL — ABNORMAL HIGH (ref 250–450)
UIBC: 561 ug/dL

## 2023-03-06 LAB — PREPARE RBC (CROSSMATCH)

## 2023-03-06 LAB — CBC
HCT: 21.6 % — CL (ref 36.0–46.0)
Hemoglobin: 6.8 g/dL — CL (ref 12.0–15.0)
MCHC: 31.5 g/dL (ref 30.0–36.0)
MCV: 75.8 fL — ABNORMAL LOW (ref 78.0–100.0)
Platelets: 323 10*3/uL (ref 150.0–400.0)
RBC: 2.85 Mil/uL — ABNORMAL LOW (ref 3.87–5.11)
RDW: 15.2 % (ref 11.5–15.5)
WBC: 6.6 10*3/uL (ref 4.0–10.5)

## 2023-03-06 LAB — RETICULOCYTES
Immature Retic Fract: 25.5 % — ABNORMAL HIGH (ref 2.3–15.9)
RBC.: 2.74 MIL/uL — ABNORMAL LOW (ref 3.87–5.11)
Retic Count, Absolute: 64.4 10*3/uL (ref 19.0–186.0)
Retic Ct Pct: 2.4 % (ref 0.4–3.1)

## 2023-03-06 LAB — FOLATE: Folate: 27.3 ng/mL (ref >5.9)

## 2023-03-06 LAB — VITAMIN B12: Vitamin B-12: 736 pg/mL (ref 180–914)

## 2023-03-06 LAB — FERRITIN: Ferritin: 6 ng/mL — ABNORMAL LOW (ref 11–307)

## 2023-03-06 LAB — ABO/RH: ABO/RH(D): O POS

## 2023-03-06 MED ORDER — ONDANSETRON HCL 4 MG/2ML IJ SOLN: 4.0000 mg | Freq: Four times a day (QID) | INTRAMUSCULAR | Status: DC | PRN

## 2023-03-06 MED ORDER — MONTELUKAST SODIUM 10 MG PO TABS: 10.0000 mg | ORAL_TABLET | Freq: Every day | ORAL | Status: DC

## 2023-03-06 MED ORDER — PANTOPRAZOLE SODIUM 40 MG IV SOLR
40.0000 mg | INTRAVENOUS | Status: DC
  Administered 2023-03-07: 40 mg via INTRAVENOUS
  Filled 2023-03-06: qty 10

## 2023-03-06 MED ORDER — HYDROCHLOROTHIAZIDE 12.5 MG PO TABS: 12.5000 mg | ORAL_TABLET | Freq: Every day | ORAL | Status: DC

## 2023-03-06 MED ORDER — IRBESARTAN 300 MG PO TABS
300.0000 mg | ORAL_TABLET | Freq: Every day | ORAL | Status: DC
  Filled 2023-03-06: qty 1

## 2023-03-06 MED ORDER — SODIUM CHLORIDE 0.9% IV SOLUTION
Freq: Once | INTRAVENOUS | Status: AC
Start: 2023-03-06 — End: 2023-03-06

## 2023-03-06 MED ORDER — ONDANSETRON HCL 4 MG PO TABS: 4.0000 mg | ORAL_TABLET | Freq: Four times a day (QID) | ORAL | Status: DC | PRN

## 2023-03-06 MED ORDER — FLUOXETINE HCL 20 MG PO CAPS
20.0000 mg | ORAL_CAPSULE | Freq: Every day | ORAL | Status: DC
  Filled 2023-03-06: qty 1

## 2023-03-06 MED ORDER — ACETAMINOPHEN 325 MG PO TABS: 650.0000 mg | ORAL_TABLET | Freq: Four times a day (QID) | ORAL | Status: DC | PRN

## 2023-03-06 MED ORDER — ATORVASTATIN CALCIUM 10 MG PO TABS
10.0000 mg | ORAL_TABLET | Freq: Every day | ORAL | Status: DC
  Filled 2023-03-06: qty 1

## 2023-03-06 MED ORDER — ALPRAZOLAM 0.25 MG PO TABS: 0.2500 mg | ORAL_TABLET | Freq: Two times a day (BID) | ORAL | Status: DC | PRN

## 2023-03-06 MED ORDER — ACETAMINOPHEN 650 MG RE SUPP: 650.0000 mg | Freq: Four times a day (QID) | RECTAL | Status: DC | PRN

## 2023-03-06 MED ORDER — TRAZODONE HCL 50 MG PO TABS
25.0000 mg | ORAL_TABLET | Freq: Every evening | ORAL | Status: DC | PRN
  Administered 2023-03-06: 25 mg via ORAL
  Filled 2023-03-06: qty 1

## 2023-03-06 MED ORDER — PANTOPRAZOLE SODIUM 40 MG IV SOLR
40.0000 mg | Freq: Once | INTRAVENOUS | Status: AC
  Administered 2023-03-06: 40 mg via INTRAVENOUS
  Filled 2023-03-06: qty 10

## 2023-03-06 MED ORDER — NEBIVOLOL HCL 10 MG PO TABS: 10.0000 mg | ORAL_TABLET | Freq: Every day | ORAL | Status: DC

## 2023-03-06 NOTE — Consult Note (Signed)
Reason for Consult: Upper GI bleed Referring Physician: Hospital team  Angela Gibbs is an 83 y.o. female.  HPI: Patient seen and examined and discussed with the ER physician and her case also discussed with her husband and her hospital computer chart including care everywhere was reviewed and she has had a few endoscopies and colonoscopies over the years but the last one was probably 09 and she knows she has a large hiatal hernia but it does not bother her and she only seems to have esophagitis with it but does not take any medicine when her family history is negative for any GI cancers and she actually threw up blood about a week ago however did not get to the doctor to recheck a blood count until a few days ago and she was called today with the results and told to go to the emergency room and she did have black stools on the day she threw up as well as a few days ago but she just ate a [employer] sandwich and has no GI complaints and denies any blood thinners aspirin or nonsteroidals and feels okay  Past Medical History:  Diagnosis Date   Abdominal pain, acute, right upper quadrant 07/29/2013   Allergy    Aortic atherosclerosis (HCC) 01/21/2023   Body mass index (BMI) 31.0-31.9, adult 12/13/2021   Cataracts, bilateral 06/06/2013   Chalazion of right upper eyelid 06/06/2014   Concussion    Current use of beta blocker 04/11/2016   DOE (dyspnea on exertion) 12/25/2022   Dyspnea on exertion 12/25/2021   Eczema 12/24/2022   Essential hypertension 02/20/2016   Eyelid lesion 06/06/2013   Falls    Fatigue 12/24/2022   Gastroesophageal reflux disease without esophagitis 04/11/2016   Head injury    Herpes    IDA (iron deficiency anemia) 01/11/2020   Inflammatory polyarthropathy (HCC) 12/24/2022   Iron deficiency anemia due to chronic blood loss 12/28/2019   Joint pain 12/24/2022   Lymphedema 12/24/2022   Migraines    Moderate persistent asthma without complication 05/13/2016   Nerve damage     Nuclear sclerosis of both eyes 06/06/2014   Obesity (BMI 30.0-34.9) 12/25/2021   Osteoarthritis 12/07/2019   Osteopenia 08/12/2022   Other allergic rhinitis 04/11/2016   Positive FIT (fecal immunochemical test) 12/28/2019   Psoriasis 12/07/2019   Psoriatic arthritis (HCC) 12/07/2019   Managed by rheumatology   PVD (posterior vitreous detachment), both eyes 03/19/2014   Restrictive lung disease 05/13/2016   Related to scoliosis and hiatal hernia   Retinal tear of right eye 02/13/2014   Shingles    Sjogren syndrome, unspecified (HCC) 12/24/2022   TIA (transient ischemic attack) 05/18/2019    Past Surgical History:  Procedure Laterality Date   BRAIN SURGERY     CRANIOTOMY FOR EXTRACRANIAL-INTRACRANIAL BYPASS     EYE SURGERY  4.4.17   milia excision   HIP SURGERY     intracranial surgery   NERVE SURGERY     ROOT CANAL     TONSILLECTOMY      Family History  Problem Relation Age of Onset   Heart disease Father    Heart attack Father     Social History:  reports that she has never smoked. She has never used smokeless tobacco. She reports current alcohol use. She reports that she does not use drugs.  Allergies:  Allergies  Allergen Reactions   Cephalosporins Shortness Of Breath    Breathing difficulty   Ciprofloxacin Hives and Shortness Of Breath   Influenza  Virus Vaccine Other (See Comments)    Numbness in legs   Keflex [Cephalexin] Hives   Latex Hives, Itching and Rash   Levaquin [Levofloxacin] Hives, Palpitations and Other (See Comments)    Chest pain   Nitrofuran Derivatives Hives, Itching and Swelling   Penicillins Hives   Sulfacetamide Sodium Hives   Xopenex [Levalbuterol Hcl] Anxiety and Other (See Comments)    Tremors with violent body chattering and shaking   Zostavax [Zoster Vaccine Live] Other (See Comments)    Pt has been told by specialists not to receive vaccine d/t h/o herpes in bloodstream and shingles.   Erythromycin Other (See Comments)     "stomach issues"   Hydrochlorothiazide Other (See Comments)    Migraine   Lisinopril Cough   Petrolatum Nausea Only   Pregabalin Other (See Comments)   Ropinirole Hcl Nausea And Vomiting   Shellfish Allergy Hives   Sulfa Antibiotics Hives   Golimumab     Other reaction(s): ?felt poorly   Ibuprofen     Other reaction(s): stomach   Lactose Other (See Comments)    "stomach issues"  Other Reaction(s): GI Intolerance   Aspirin Other (See Comments)    Stomach pain/burning   Avelox [Moxifloxacin Hcl In Nacl] Nausea Only   Cefdinir Nausea And Vomiting   Chromium Other (See Comments)    Blisters   Egg Solids, Whole Other (See Comments)    Per allergy testing, no known reaction. Pt states she was told she could probably eat 2-3 eggs weekly w/o issue.   Egg-Derived Products     Per allergy testing, no known reaction. Pt states she was told she could probably eat 2-3 eggs weekly w/o issue.   Iodinated Contrast Media Other (See Comments)    unknown    Lactose Intolerance (Gi) Other (See Comments)    "stomach issues"   Percodan [Oxycodone-Aspirin] Nausea And Vomiting   Prednisone Palpitations    No injections, can do the pills   Troleandomycin Other (See Comments)    Unknown reaction    Medications: I have reviewed the patient's current medications.  Results for orders placed or performed during the hospital encounter of 03/06/23 (from the past 48 hours)  Comprehensive metabolic panel     Status: Abnormal   Collection Time: 03/06/23  1:42 PM  Result Value Ref Range   Sodium 131 (L) 135 - 145 mmol/L   Potassium 4.0 3.5 - 5.1 mmol/L   Chloride 100 98 - 111 mmol/L   CO2 25 22 - 32 mmol/L   Glucose, Bld 96 70 - 99 mg/dL    Comment: Glucose reference range applies only to samples taken after fasting for at least 8 hours.   BUN 19 8 - 23 mg/dL   Creatinine, Ser 1.30 0.44 - 1.00 mg/dL   Calcium 8.7 (L) 8.9 - 10.3 mg/dL   Total Protein 6.2 (L) 6.5 - 8.1 g/dL   Albumin 3.6 3.5 - 5.0  g/dL   AST 18 15 - 41 U/L   ALT 18 0 - 44 U/L   Alkaline Phosphatase 64 38 - 126 U/L   Total Bilirubin 0.5 0.0 - 1.2 mg/dL   GFR, Estimated >86 >57 mL/min    Comment: (NOTE) Calculated using the CKD-EPI Creatinine Equation (2021)    Anion gap 6 5 - 15    Comment: Performed at [facility], [address] [place] [address] [place], [place] [address]  CBC with Differential     Status: Abnormal   Collection Time: 03/06/23  1:42 PM  Result Value Ref Range   WBC 6.0 4.0 - 10.5 K/uL   RBC 2.72 (L) 3.87 - 5.11 MIL/uL   Hemoglobin 6.3 (LL) 12.0 - 15.0 g/dL    Comment: REPEATED TO VERIFY Reticulocyte Hemoglobin testing may be clinically indicated, consider ordering this additional test [id number] THIS CRITICAL RESULT HAS VERIFIED AND BEEN CALLED TO SHAW,S RN BY SARA SHAEFFER ON 01 17 2025 AT 1418, AND HAS BEEN READ BACK.     HCT 21.2 (L) 36.0 - 46.0 %   MCV 77.9 (L) 80.0 - 100.0 fL   MCH 23.2 (L) 26.0 - 34.0 pg   MCHC 29.7 (L) 30.0 - 36.0 g/dL   RDW 16.6 06.3 - 01.6 %   Platelets 279 150 - 400 K/uL   nRBC 0.0 0.0 - 0.2 %   Neutrophils Relative % 74 %   Neutro Abs 4.5 1.7 - 7.7 K/uL   Lymphocytes Relative 10 %   Lymphs Abs 0.6 (L) 0.7 - 4.0 K/uL   Monocytes Relative 13 %   Monocytes Absolute 0.8 0.1 - 1.0 K/uL   Eosinophils Relative 2 %   Eosinophils Absolute 0.1 0.0 - 0.5 K/uL   Basophils Relative 1 %   Basophils Absolute 0.1 0.0 - 0.1 K/uL   Immature Granulocytes 0 %   Abs Immature Granulocytes 0.01 0.00 - 0.07 K/uL    Comment: Performed at [facility], [address] [place], [place] [address]  Type and screen [facility]     Status: None (Preliminary result)   Collection Time: 03/06/23  1:42 PM  Result Value Ref Range   ABO/RH(D) O POS    Antibody Screen NEG    Sample Expiration 03/09/2023,2359    Unit Number [id number]    Blood Component Type RED CELLS,LR    Unit division 00    Status of Unit ALLOCATED    Transfusion Status  OK TO TRANSFUSE    Crossmatch Result      Compatible Performed at [facility], [address], [place], [place] [address]   Reticulocytes     Status: Abnormal   Collection Time: 03/06/23  1:42 PM  Result Value Ref Range   Retic Ct Pct 2.4 0.4 - 3.1 %   RBC. 2.74 (L) 3.87 - 5.11 MIL/uL   Retic Count, Absolute 64.4 19.0 - 186.0 K/uL   Immature Retic Fract 25.5 (H) 2.3 - 15.9 %    Comment: Performed at [facility], [address], [place], [place] [address]  POC occult blood, ED     Status: Abnormal   Collection Time: 03/06/23  1:57 PM  Result Value Ref Range   Fecal Occult Bld POSITIVE (A) NEGATIVE  Prepare RBC (crossmatch)     Status: None   Collection Time: 03/06/23  4:07 PM  Result Value Ref Range   Order Confirmation      ORDER PROCESSED BY BLOOD BANK Performed at [facility], [address], [place], [place] [address]   ABO/Rh     Status: None   Collection Time: 03/06/23  4:07 PM  Result Value Ref Range   ABO/RH(D)      O POS Performed at [facility], [address], [place], [place] [address]     No results found.  Review of Systems negative except above Blood pressure (!) 144/83, pulse 70, temperature 98.2 F (36.8 C), temperature source Oral, resp. rate 16, SpO2 100%. Physical Exam vital signs stable  afebrile no acute distress exam pertinent for abdomen being soft nontender BUN and creatinine normal liver tests normal iron studies and September were okay with a low normal ferritin hemoglobin dropped from September 2012 0.2-6.3 and MCV dropped from 89-77 Assessment/Plan: Upper GI bleed in patient with large hiatal hernia Plan: The risk benefits methods of endoscopy was discussed with the patient and we will proceed tomorrow morning with further workup and plans pending those findings  Chayim Bialas E 03/06/2023, 4:48 PM

## 2023-03-06 NOTE — ED Notes (Signed)
Hospital bed requested for pt and reclined placed in room for pt spouse.

## 2023-03-06 NOTE — Telephone Encounter (Signed)
Received critical hg 6.8- called pt. She is at home with husband, likely GI bleed with dark stools. They will get dressed and head to the ER at [facility] right away.  Pt is unsure about receiving blood- she notes that prior transfusions "took me a long time to recover from" after prior operations.  However she did not have an allergic reaction.  Offered reassurance that she will not be forced to receive whole blood (rbcs might be an option) but also advised blood may be the best thing for her- defer to advice of ER providers

## 2023-03-06 NOTE — ED Provider Notes (Signed)
[place] EMERGENCY DEPARTMENT AT [facility] Provider Note   CSN: [id number] Arrival date & time: 03/06/23  1250     History  Chief Complaint  Patient presents with   Low Hemoglobin    Angela Gibbs is a 83 y.o. female.  HPI 83 year old female presents with low hemoglobin.  She noted a black stool yesterday and had had a black stool a few days prior as well.  She had also had dark emesis a few days ago.  Called her doctor who drew blood work yesterday evening and she got a call this morning that her hemoglobin was 6.8.  She has chronic dyspnea for over a year that has been slowly getting worse, though it is hard to tell but does not seem like it is particularly worse in the last few days.  No abdominal pain or chest pain.  She is not on blood thinners or aspirin.  Home Medications Prior to Admission medications   Medication Sig Start Date End Date Taking? Authorizing Provider  ALPRAZolam (XANAX) 0.25 MG tablet TAKE 1 TABLET(0.25 MG) BY MOUTH TWICE DAILY AS NEEDED FOR ANXIETY 02/19/23   Copland, Gwenlyn Found, MD  Ascorbic Acid (VITAMIN C) 1000 MG tablet Take 1,000 mg by mouth daily.    [provider]  atorvastatin (LIPITOR) 10 MG tablet Take 1 tablet (10 mg total) by mouth daily. 01/23/23 04/23/23  Revankar, Aundra Dubin, MD  Cholecalciferol 125 MCG (5000 UT) TABS Take 5,000 Units by mouth daily.    [provider]  clindamycin (CLEOCIN) 150 MG capsule Take 450 mg by mouth 3 (three) times daily. 1 hour prior to dental procedures and 6 hours post dental procedure 05/03/20   [provider]  diclofenac Sodium (VOLTAREN) 1 % GEL APPLY 2 GRAMS EXTERNALLY TO THE AFFECTED AREA FOUR TIMES DAILY 02/22/19   Kathryne Hitch, MD  FLUoxetine (PROZAC) 20 MG capsule TAKE 1 CAPSULE(20 MG) BY MOUTH DAILY 01/26/23   Copland, Gwenlyn Found, MD  furosemide (LASIX) 20 MG tablet Take 20 mg by mouth daily. 12/25/22   [provider]  hydrochlorothiazide (MICROZIDE) 12.5  MG capsule Take 1 capsule (12.5 mg total) by mouth daily. 02/19/23 05/20/23  Revankar, Aundra Dubin, MD  MAGNESIUM GLYCINATE PO Take 50 mg by mouth daily.    [provider]  montelukast (SINGULAIR) 10 MG tablet TAKE 1 TABLET(10 MG) BY MOUTH AT BEDTIME 06/18/22   Copland, Gwenlyn Found, MD  nebivolol (BYSTOLIC) 10 MG tablet Take 1 tablet (10 mg total) by mouth daily. 12/10/22   Copland, Gwenlyn Found, MD  omeprazole (PRILOSEC OTC) 20 MG tablet Take 20 mg by mouth daily. 12/10/10   [provider]  Prednicarbate 0.1 % CREA Apply 1 Application topically as needed for itching. 03/15/12   [provider]  sodium chloride (V-R NASAL SPRAY SALINE) 0.65 % nasal spray Place 1 spray into the nose as needed. Use in both nostrils twice daily 06/11/16   Copland, Gwenlyn Found, MD  spironolactone (ALDACTONE) 25 MG tablet TAKE 1/2 TABLET(12.5 MG) BY MOUTH DAILY 07/01/22   Copland, Gwenlyn Found, MD  thyroid (ARMOUR) 30 MG tablet Take 30 mg by mouth daily. 11/26/21   [provider]  valsartan (DIOVAN) 320 MG tablet TAKE 1 TABLET(320 MG) BY MOUTH DAILY 12/22/22   Copland, Gwenlyn Found, MD      Allergies    Cephalosporins; Ciprofloxacin; Influenza virus vaccine; Keflex [cephalexin]; Latex; Levaquin [levofloxacin]; Nitrofuran derivatives; Penicillins; Sulfacetamide sodium; Xopenex [levalbuterol hcl]; Zostavax [zoster vaccine live];  Erythromycin; Hydrochlorothiazide; Lisinopril; Petrolatum; Pregabalin; Ropinirole hcl; Shellfish allergy; Sulfa antibiotics; Golimumab; Ibuprofen; Lactose; Aspirin; Avelox [moxifloxacin hcl in nacl]; Cefdinir; Chromium; Egg solids, whole; Egg-derived products; Iodinated contrast media; Lactose intolerance (gi); Percodan [oxycodone-aspirin]; Prednisone; and Troleandomycin    Review of Systems   Review of Systems  Respiratory:  Positive for shortness of breath.   Gastrointestinal:  Positive for blood in stool. Negative for abdominal pain.    Physical Exam Updated Vital Signs BP (!)  153/54 (BP Location: Right Arm)   Pulse 63   Temp 98.2 F (36.8 C) (Oral)   Resp 20   SpO2 99%  Physical Exam Vitals and nursing note reviewed. Exam conducted with a chaperone present.  Constitutional:      Appearance: She is well-developed.  HENT:     Head: Normocephalic and atraumatic.  Cardiovascular:     Rate and Rhythm: Normal rate and regular rhythm.     Heart sounds: Normal heart sounds.  Pulmonary:     Effort: Pulmonary effort is normal.     Breath sounds: Normal breath sounds.  Abdominal:     Palpations: Abdomen is soft.     Tenderness: There is no abdominal tenderness.  Genitourinary:    Rectum: Guaiac result positive. No external hemorrhoid.     Comments: Dark but brown stool (however minimal stool overall) on exam Skin:    General: Skin is warm and dry.  Neurological:     Mental Status: She is alert.     ED Results / Procedures / Treatments   Labs (all labs ordered are listed, but only abnormal results are displayed) Labs Reviewed  COMPREHENSIVE METABOLIC PANEL - Abnormal; Notable for the following components:      Result Value   Sodium 131 (*)    Calcium 8.7 (*)    Total Protein 6.2 (*)    All other components within normal limits  CBC WITH DIFFERENTIAL/PLATELET - Abnormal; Notable for the following components:   RBC 2.72 (*)    Hemoglobin 6.3 (*)    HCT 21.2 (*)    MCV 77.9 (*)    MCH 23.2 (*)    MCHC 29.7 (*)    Lymphs Abs 0.6 (*)    All other components within normal limits  RETICULOCYTES - Abnormal; Notable for the following components:   RBC. 2.74 (*)    Immature Retic Fract 25.5 (*)    All other components within normal limits  POC OCCULT BLOOD, ED - Abnormal; Notable for the following components:   Fecal Occult Bld POSITIVE (*)    All other components within normal limits  VITAMIN B12  FOLATE  IRON AND TIBC  FERRITIN  TYPE AND SCREEN  PREPARE RBC (CROSSMATCH)  ABO/RH    EKG None  Radiology No results  found.  Procedures .Critical Care  Performed by: Pricilla Loveless, MD Authorized by: Pricilla Loveless, MD   Critical care provider statement:    Critical care time (minutes):  30   Critical care time was exclusive of:  Separately billable procedures and treating other patients   Critical care was necessary to treat or prevent imminent or life-threatening deterioration of the following conditions:  Circulatory failure   Critical care was time spent personally by me on the following activities:  Development of treatment plan with patient or surrogate, discussions with consultants, evaluation of patient's response to treatment, examination of patient, ordering and review of laboratory studies, ordering and review of radiographic studies, ordering and performing treatments and interventions, pulse oximetry, re-evaluation of  patient's condition and review of old charts     Medications Ordered in ED Medications  0.9 %  sodium chloride infusion (Manually program via Guardrails IV Fluids) (has no administration in time range)  pantoprazole (PROTONIX) injection 40 mg (has no administration in time range)    ED Course/ Medical Decision Making/ A&P                                 Medical Decision Making Amount and/or Complexity of Data Reviewed External Data Reviewed: labs and notes.    Details: 6.8 hemoglobin Labs: ordered.    Details: Hemoglobin now 6.3.  Normal BUN  Risk Prescription drug management. Decision regarding hospitalization.   Patient presents with symptomatic anemia.  Acute on chronic shortness of breath.  Appears to have black stools though no current vomiting.  Stool is Hemoccult positive and she was given a dose of Protonix and given a unit of blood.  Patient is otherwise stable and I have discussed with [facility], Doug Sou, who will see in consultation.  Continue Protonix for now.  Discussed with Dr. Kirby Crigler for admission.        Final Clinical  Impression(s) / ED Diagnoses Final diagnoses:  Acute GI bleeding  Symptomatic anemia    Rx / DC Orders ED Discharge Orders     None         Pricilla Loveless, MD 03/06/23 1531

## 2023-03-06 NOTE — ED Triage Notes (Signed)
Pt sent by dr for hgb of 6.8 for labs drawn late yesterday. Pt reports more SHOB than usual with exertion, also had a black stool yesterday, black diarrhea a few days ago and some coffee ground emesis.

## 2023-03-06 NOTE — ED Notes (Signed)
Per charge, Pt spouse stated pt is requesting to leave. Provider notified.

## 2023-03-06 NOTE — Progress Notes (Signed)
Specimen drop off

## 2023-03-06 NOTE — Telephone Encounter (Signed)
CRITICAL VALUE STICKER  CRITICAL VALUE: HGB: 6.8 Hct: 21.8  RECEIVER (on-site recipient of call): Melton Alar, RMA  DATE & TIME NOTIFIED:  03/06/23, 8:43 AM  MESSENGER (representative from lab): Mallie Darting  MD NOTIFIED: PCP: [name] DOD: Willow Ora  TIME OF NOTIFICATION: 03/06/23, 8:43 AM  RESPONSE:  Pending

## 2023-03-06 NOTE — H&P (Signed)
History and Physical  Angela Gibbs:[id number] DOB: 08/11/1940 DOA: 03/06/2023  PCP: Pearline Cables, MD   Chief Complaint: Dark stools, low hemoglobin  HPI: Angela Gibbs is a 83 y.o. female with medical history significant for chronic hyponatremia, iron deficiency anemia, chronic dyspnea on exertion being admitted to the hospital with acute on chronic anemia and concern for upper GI bleeding.  She has chronic dyspnea for over the last year that has been gradually getting worse, starting a couple days ago she noticed that she had a couple of dark stools.  She did also have 1 episode of coffee-ground emesis about 1 week ago.  She was also feeling a little bit more lethargic than usual and discussed this with her PCP.  Outpatient labs were done yesterday, hemoglobin was 6.8 she was called to come to the ER this morning.  Denies any chest pain, syncope, lightheadedness.  Denies any hematemesis or bright red blood per rectum.  No abdominal pain or nausea she does not take any blood thinners and denies any NSAID use.  Review of Systems: Please see HPI for pertinent positives and negatives. A complete 10 system review of systems are otherwise negative.  Past Medical History:  Diagnosis Date   Abdominal pain, acute, right upper quadrant 07/29/2013   Allergy    Aortic atherosclerosis (HCC) 01/21/2023   Body mass index (BMI) 31.0-31.9, adult 12/13/2021   Cataracts, bilateral 06/06/2013   Chalazion of right upper eyelid 06/06/2014   Concussion    Current use of beta blocker 04/11/2016   DOE (dyspnea on exertion) 12/25/2022   Dyspnea on exertion 12/25/2021   Eczema 12/24/2022   Essential hypertension 02/20/2016   Eyelid lesion 06/06/2013   Falls    Fatigue 12/24/2022   Gastroesophageal reflux disease without esophagitis 04/11/2016   Head injury    Herpes    IDA (iron deficiency anemia) 01/11/2020   Inflammatory polyarthropathy (HCC) 12/24/2022   Iron deficiency anemia due to  chronic blood loss 12/28/2019   Joint pain 12/24/2022   Lymphedema 12/24/2022   Migraines    Moderate persistent asthma without complication 05/13/2016   Nerve damage    Nuclear sclerosis of both eyes 06/06/2014   Obesity (BMI 30.0-34.9) 12/25/2021   Osteoarthritis 12/07/2019   Osteopenia 08/12/2022   Other allergic rhinitis 04/11/2016   Positive FIT (fecal immunochemical test) 12/28/2019   Psoriasis 12/07/2019   Psoriatic arthritis (HCC) 12/07/2019   Managed by rheumatology   PVD (posterior vitreous detachment), both eyes 03/19/2014   Restrictive lung disease 05/13/2016   Related to scoliosis and hiatal hernia   Retinal tear of right eye 02/13/2014   Shingles    Sjogren syndrome, unspecified (HCC) 12/24/2022   TIA (transient ischemic attack) 05/18/2019   Past Surgical History:  Procedure Laterality Date   BRAIN SURGERY     CRANIOTOMY FOR EXTRACRANIAL-INTRACRANIAL BYPASS     EYE SURGERY  4.4.17   milia excision   HIP SURGERY     intracranial surgery   NERVE SURGERY     ROOT CANAL     TONSILLECTOMY     Social History:  reports that she has never smoked. She has never used smokeless tobacco. She reports current alcohol use. She reports that she does not use drugs.  Allergies  Allergen Reactions   Cephalosporins Shortness Of Breath    Breathing difficulty   Ciprofloxacin Hives and Shortness Of Breath   Influenza Virus Vaccine Other (See Comments)    Numbness in legs   Keflex [Cephalexin]  Hives   Latex Hives, Itching and Rash   Levaquin [Levofloxacin] Hives, Palpitations and Other (See Comments)    Chest pain   Nitrofuran Derivatives Hives, Itching and Swelling   Penicillins Hives   Sulfacetamide Sodium Hives   Xopenex [Levalbuterol Hcl] Anxiety and Other (See Comments)    Tremors with violent body chattering and shaking   Zostavax [Zoster Vaccine Live] Other (See Comments)    Pt has been told by specialists not to receive vaccine d/t h/o herpes in bloodstream and  shingles.   Erythromycin Other (See Comments)    "stomach issues"   Hydrochlorothiazide Other (See Comments)    Migraine   Lisinopril Cough   Petrolatum Nausea Only   Pregabalin Other (See Comments)   Ropinirole Hcl Nausea And Vomiting   Shellfish Allergy Hives   Sulfa Antibiotics Hives   Golimumab     Other reaction(s): ?felt poorly   Ibuprofen     Other reaction(s): stomach   Lactose Other (See Comments)    "stomach issues"  Other Reaction(s): GI Intolerance   Aspirin Other (See Comments)    Stomach pain/burning   Avelox [Moxifloxacin Hcl In Nacl] Nausea Only   Cefdinir Nausea And Vomiting   Chromium Other (See Comments)    Blisters   Egg Solids, Whole Other (See Comments)    Per allergy testing, no known reaction. Pt states she was told she could probably eat 2-3 eggs weekly w/o issue.   Egg-Derived Products     Per allergy testing, no known reaction. Pt states she was told she could probably eat 2-3 eggs weekly w/o issue.   Iodinated Contrast Media Other (See Comments)    unknown    Lactose Intolerance (Gi) Other (See Comments)    "stomach issues"   Percodan [Oxycodone-Aspirin] Nausea And Vomiting   Prednisone Palpitations    No injections, can do the pills   Troleandomycin Other (See Comments)    Unknown reaction    Family History  Problem Relation Age of Onset   Heart disease Father    Heart attack Father      Prior to Admission medications   Medication Sig Start Date End Date Taking? Authorizing Provider  ALPRAZolam (XANAX) 0.25 MG tablet TAKE 1 TABLET(0.25 MG) BY MOUTH TWICE DAILY AS NEEDED FOR ANXIETY 02/19/23   Copland, Gwenlyn Found, MD  Ascorbic Acid (VITAMIN C) 1000 MG tablet Take 1,000 mg by mouth daily.    [provider]  atorvastatin (LIPITOR) 10 MG tablet Take 1 tablet (10 mg total) by mouth daily. 01/23/23 04/23/23  Revankar, Aundra Dubin, MD  Cholecalciferol 125 MCG (5000 UT) TABS Take 5,000 Units by mouth daily.    [provider]   clindamycin (CLEOCIN) 150 MG capsule Take 450 mg by mouth 3 (three) times daily. 1 hour prior to dental procedures and 6 hours post dental procedure 05/03/20   [provider]  diclofenac Sodium (VOLTAREN) 1 % GEL APPLY 2 GRAMS EXTERNALLY TO THE AFFECTED AREA FOUR TIMES DAILY 02/22/19   Kathryne Hitch, MD  FLUoxetine (PROZAC) 20 MG capsule TAKE 1 CAPSULE(20 MG) BY MOUTH DAILY 01/26/23   Copland, Gwenlyn Found, MD  furosemide (LASIX) 20 MG tablet Take 20 mg by mouth daily. 12/25/22   [provider]  hydrochlorothiazide (MICROZIDE) 12.5 MG capsule Take 1 capsule (12.5 mg total) by mouth daily. 02/19/23 05/20/23  Revankar, Aundra Dubin, MD  MAGNESIUM GLYCINATE PO Take 50 mg by mouth daily.    [provider]  montelukast (SINGULAIR) 10 MG  tablet TAKE 1 TABLET(10 MG) BY MOUTH AT BEDTIME 06/18/22   Copland, Gwenlyn Found, MD  nebivolol (BYSTOLIC) 10 MG tablet Take 1 tablet (10 mg total) by mouth daily. 12/10/22   Copland, Gwenlyn Found, MD  omeprazole (PRILOSEC OTC) 20 MG tablet Take 20 mg by mouth daily. 12/10/10   [provider]  Prednicarbate 0.1 % CREA Apply 1 Application topically as needed for itching. 03/15/12   [provider]  sodium chloride (V-R NASAL SPRAY SALINE) 0.65 % nasal spray Place 1 spray into the nose as needed. Use in both nostrils twice daily 06/11/16   Copland, Gwenlyn Found, MD  spironolactone (ALDACTONE) 25 MG tablet TAKE 1/2 TABLET(12.5 MG) BY MOUTH DAILY 07/01/22   Copland, Gwenlyn Found, MD  thyroid (ARMOUR) 30 MG tablet Take 30 mg by mouth daily. 11/26/21   [provider]  valsartan (DIOVAN) 320 MG tablet TAKE 1 TABLET(320 MG) BY MOUTH DAILY 12/22/22   Copland, Gwenlyn Found, MD    Physical Exam: BP (!) 153/54 (BP Location: Right Arm)   Pulse 63   Temp 98.2 F (36.8 C) (Oral)   Resp 20   SpO2 99%  General:  Alert, oriented, calm, in no acute distress, looks a little pale.  Her husband is at the bedside. Eyes: EOMI, clear conjuctivae, Demartini  sclerea Neck: supple, no masses, trachea mildline  Cardiovascular: RRR, no murmurs or rubs, no peripheral edema  Respiratory: clear to auscultation bilaterally, no wheezes, no crackles  Abdomen: soft, nontender, nondistended, normal bowel tones heard  Skin: dry, no rashes  Musculoskeletal: no joint effusions, normal range of motion  Psychiatric: appropriate affect, normal speech  Neurologic: extraocular muscles intact, clear speech, moving all extremities with intact sensorium         Labs on Admission:  Basic Metabolic Panel: Recent Labs  Lab 03/05/23 1515 03/06/23 1342  NA 132* 131*  K 4.4 4.0  CL 100 100  CO2 25 25  GLUCOSE 99 96  BUN 18 19  CREATININE 0.57 0.71  CALCIUM 8.7 8.7*   Liver Function Tests: Recent Labs  Lab 03/05/23 1515 03/06/23 1342  AST 17 18  ALT 14 18  ALKPHOS 67 64  BILITOT 0.4 0.5  PROT 6.2 6.2*  ALBUMIN 3.9 3.6   No results for input(s): "LIPASE", "AMYLASE" in the last 168 hours. No results for input(s): "AMMONIA" in the last 168 hours. CBC: Recent Labs  Lab 03/05/23 1515 03/06/23 1342  WBC 6.6 6.0  NEUTROABS  --  4.5  HGB 6.8 Repeated and verified X2.* 6.3*  HCT 21.6 Repeated and verified X2.* 21.2*  MCV 75.8* 77.9*  PLT 323.0 279   Cardiac Enzymes: No results for input(s): "CKTOTAL", "CKMB", "CKMBINDEX", "TROPONINI" in the last 168 hours. BNP (last 3 results) No results for input(s): "BNP" in the last 8760 hours.  ProBNP (last 3 results) No results for input(s): "PROBNP" in the last 8760 hours.  CBG: No results for input(s): "GLUCAP" in the last 168 hours.  Radiological Exams on Admission: No results found.  Assessment/Plan Makinzy ARMELIA GROVEN is a 83 y.o. female with medical history significant for chronic hyponatremia, iron deficiency anemia, chronic dyspnea on exertion being admitted to the hospital with acute on chronic anemia and concern for upper GI bleeding.   Acute on chronic iron deficiency anemia-concerning for  upper GI bleed given melanotic stools and dark coffee-ground emesis a few days ago.  States that she has had multiple colonoscopies in the past, as well as for upper endoscopies which  have found no source of bleeding. -Observation admission -Transfuse 1 unit PRBC now, with goal hemoglobin greater than 7 -IV PPI daily -Trend hemoglobin every 8 hours -ER provider discussed with [facility], who will consult -Will keep n.p.o. for the time being -Check anemia panel  Hypertension-continue home HCTZ, nebivolol, ARB  Hyponatremia-this appears to be chronic and at baseline, may need to hold HCTZ if worsens  Depression-Prozac  DVT prophylaxis: SCDs only    Code Status: Full Code  Consults called: [facility]  Admission status: Observation  Time spent: 47 minutes  Micheil Klaus Sharlette Dense MD [facility] Pager [phone]  If 7PM-7AM, please contact night-coverage [email or web address] Password [facility]  03/06/2023, 3:46 PM

## 2023-03-06 NOTE — ED Notes (Signed)
Np spoke to pt, pt agreed to stay.

## 2023-03-07 ENCOUNTER — Encounter (HOSPITAL_COMMUNITY)
Admission: EM | Discharge status: Against Medical Advice | Payer: Self-pay | Source: Home / Self Care | Attending: Emergency Medicine

## 2023-03-07 ENCOUNTER — Other Ambulatory Visit: Payer: Self-pay | Admitting: Family Medicine

## 2023-03-07 DIAGNOSIS — I10 Essential (primary) hypertension: Secondary | ICD-10-CM

## 2023-03-07 DIAGNOSIS — Z6833 Body mass index (BMI) 33.0-33.9, adult: Secondary | ICD-10-CM | POA: Diagnosis not present

## 2023-03-07 DIAGNOSIS — K3189 Other diseases of stomach and duodenum: Secondary | ICD-10-CM | POA: Diagnosis not present

## 2023-03-07 DIAGNOSIS — J4540 Moderate persistent asthma, uncomplicated: Secondary | ICD-10-CM | POA: Diagnosis not present

## 2023-03-07 DIAGNOSIS — D649 Anemia, unspecified: Secondary | ICD-10-CM

## 2023-03-07 DIAGNOSIS — E669 Obesity, unspecified: Secondary | ICD-10-CM | POA: Diagnosis not present

## 2023-03-07 DIAGNOSIS — E66811 Obesity, class 1: Secondary | ICD-10-CM | POA: Diagnosis not present

## 2023-03-07 DIAGNOSIS — I700 Atherosclerosis of aorta: Secondary | ICD-10-CM | POA: Diagnosis not present

## 2023-03-07 DIAGNOSIS — L538 Other specified erythematous conditions: Secondary | ICD-10-CM | POA: Diagnosis present

## 2023-03-07 DIAGNOSIS — K922 Gastrointestinal hemorrhage, unspecified: Secondary | ICD-10-CM | POA: Diagnosis not present

## 2023-03-07 DIAGNOSIS — G2581 Restless legs syndrome: Secondary | ICD-10-CM | POA: Diagnosis not present

## 2023-03-07 DIAGNOSIS — D509 Iron deficiency anemia, unspecified: Secondary | ICD-10-CM | POA: Diagnosis not present

## 2023-03-07 DIAGNOSIS — K2970 Gastritis, unspecified, without bleeding: Secondary | ICD-10-CM | POA: Diagnosis present

## 2023-03-07 DIAGNOSIS — M8580 Other specified disorders of bone density and structure, unspecified site: Secondary | ICD-10-CM | POA: Diagnosis present

## 2023-03-07 DIAGNOSIS — L4050 Arthropathic psoriasis, unspecified: Secondary | ICD-10-CM | POA: Diagnosis not present

## 2023-03-07 DIAGNOSIS — M3500 Sicca syndrome, unspecified: Secondary | ICD-10-CM | POA: Diagnosis present

## 2023-03-07 DIAGNOSIS — K219 Gastro-esophageal reflux disease without esophagitis: Secondary | ICD-10-CM | POA: Diagnosis not present

## 2023-03-07 DIAGNOSIS — D500 Iron deficiency anemia secondary to blood loss (chronic): Secondary | ICD-10-CM | POA: Diagnosis not present

## 2023-03-07 DIAGNOSIS — H2513 Age-related nuclear cataract, bilateral: Secondary | ICD-10-CM | POA: Diagnosis present

## 2023-03-07 DIAGNOSIS — M419 Scoliosis, unspecified: Secondary | ICD-10-CM | POA: Diagnosis present

## 2023-03-07 DIAGNOSIS — E782 Mixed hyperlipidemia: Secondary | ICD-10-CM | POA: Diagnosis not present

## 2023-03-07 DIAGNOSIS — G43909 Migraine, unspecified, not intractable, without status migrainosus: Secondary | ICD-10-CM | POA: Diagnosis present

## 2023-03-07 DIAGNOSIS — M064 Inflammatory polyarthropathy: Secondary | ICD-10-CM | POA: Diagnosis not present

## 2023-03-07 DIAGNOSIS — K921 Melena: Secondary | ICD-10-CM | POA: Diagnosis not present

## 2023-03-07 DIAGNOSIS — E871 Hypo-osmolality and hyponatremia: Secondary | ICD-10-CM | POA: Diagnosis not present

## 2023-03-07 DIAGNOSIS — G459 Transient cerebral ischemic attack, unspecified: Secondary | ICD-10-CM | POA: Diagnosis not present

## 2023-03-07 DIAGNOSIS — K449 Diaphragmatic hernia without obstruction or gangrene: Secondary | ICD-10-CM | POA: Diagnosis not present

## 2023-03-07 DIAGNOSIS — E039 Hypothyroidism, unspecified: Secondary | ICD-10-CM | POA: Diagnosis not present

## 2023-03-07 DIAGNOSIS — D62 Acute posthemorrhagic anemia: Secondary | ICD-10-CM | POA: Diagnosis not present

## 2023-03-07 DIAGNOSIS — K222 Esophageal obstruction: Secondary | ICD-10-CM | POA: Diagnosis not present

## 2023-03-07 LAB — BASIC METABOLIC PANEL
Anion gap: 7 (ref 5–15)
BUN: 14 mg/dL (ref 8–23)
CO2: 23 mmol/L (ref 22–32)
Calcium: 8.3 mg/dL — ABNORMAL LOW (ref 8.9–10.3)
Chloride: 101 mmol/L (ref 98–111)
Creatinine, Ser: <0.3 mg/dL — ABNORMAL LOW (ref 0.44–1.00)
Glucose, Bld: 104 mg/dL — ABNORMAL HIGH (ref 70–99)
Potassium: 3.9 mmol/L (ref 3.5–5.1)
Sodium: 131 mmol/L — ABNORMAL LOW (ref 135–145)

## 2023-03-07 LAB — HEMOGLOBIN AND HEMATOCRIT, BLOOD
HCT: 23 % — ABNORMAL LOW (ref 36.0–46.0)
HCT: 23.7 % — ABNORMAL LOW (ref 36.0–46.0)
Hemoglobin: 7.2 g/dL — ABNORMAL LOW (ref 12.0–15.0)
Hemoglobin: 7.4 g/dL — ABNORMAL LOW (ref 12.0–15.0)

## 2023-03-07 SURGERY — CANCELLED PROCEDURE

## 2023-03-07 MED ORDER — NEBIVOLOL HCL 10 MG PO TABS
10.0000 mg | ORAL_TABLET | Freq: Every day | ORAL | Status: DC
  Filled 2023-03-07: qty 1

## 2023-03-07 MED ORDER — NEBIVOLOL HCL 10 MG PO TABS
10.0000 mg | ORAL_TABLET | Freq: Every day | ORAL | Status: DC
  Administered 2023-03-07: 10 mg via ORAL
  Filled 2023-03-07: qty 1

## 2023-03-07 MED ORDER — PANTOPRAZOLE SODIUM 40 MG IV SOLR: 40.0000 mg | Freq: Two times a day (BID) | INTRAVENOUS | Status: DC

## 2023-03-07 SURGICAL SUPPLY — 12 items
BLOCK BITE 60FR ADLT L/F BLUE (MISCELLANEOUS) ×2
ELECT REM PT RETURN 9FT ADLT (ELECTROSURGICAL)
FORCEP RJ3 GP 1.8X160 W-NEEDLE (CUTTING FORCEPS)
FORCEPS BIOP RAD 4 LRG CAP 4 (CUTTING FORCEPS)
NEEDLE SCLEROTHERAPY 25GX240 (NEEDLE)
PROBE APC STR FIRE (PROBE)
PROBE INJECTION GOLD 7FR (MISCELLANEOUS)
SNARE SHORT THROW 13M SML OVAL (MISCELLANEOUS)
SYR 50ML LL SCALE MARK (SYRINGE)
TUBING ENDO SMARTCAP PENTAX (MISCELLANEOUS) ×4
TUBING IRRIGATION ENDOGATOR (MISCELLANEOUS) ×2
WATER STERILE IRR 1000ML POUR (IV SOLUTION)

## 2023-03-07 NOTE — Discharge Summary (Signed)
  [facility]  Physician Discharge Summary   Patient ID: Angela Gibbs MRN: [id number] DOB/AGE: 1940-06-07 83 y.o.  Admit date: 03/06/2023 Discharge date: 03/07/2023    PCP: Pearline Cables, MD   PATIENT left AGAINST MEDICAL ADVICE    INITIAL HISTORY: 83 y.o. female with medical history significant for chronic hyponatremia, iron deficiency anemia, chronic dyspnea on exertion being admitted to the hospital with acute on chronic anemia and concern for upper GI bleeding.  She has chronic dyspnea for over the last year that has been gradually getting worse, starting a couple days ago she noticed that she had a couple of dark stools.  She did also have 1 episode of coffee-ground emesis about 1 week ago.  She was also feeling a little bit more lethargic than usual and discussed this with her PCP.  Outpatient labs showed severe anemia and she was asked to come to the emergency department.     Consultants: Gastroenterology    HOSPITAL COURSE:   Concern for upper GI bleed Patient had coffee-ground emesis and perhaps melanotic stools prior to admission.  Seen by gastroenterology.  Patient was placed on PPI.  Plan was for upper endoscopy.  Procedure had to be moved from early afternoon to late afternoon due to anesthesia availability.  This upset the patient and her husband significantly.  They wanted to leave the hospital.  They were told that it was only a few hours delay and to stay back.  They understood that if they left she could potentially have recurrence of her bleeding.  They understand the risk of leaving and decided to leave AGAINST MEDICAL ADVICE.     Acute blood loss anemia/severe iron deficiency    Essential hypertension    Hyponatremia   History of depression   Obesity Estimated body mass index is 33.94 kg/m as calculated from the following:   Height as of 02/02/23: 5' 2.6" (1.59 m).   Weight as of this encounter: 85.8 kg.  PATIENT left AGAINST MEDICAL  ADVICE   PERTINENT LABS:  The results of significant diagnostics from this hospitalization (including imaging, microbiology, ancillary and laboratory) are listed below for reference.    Labs:   Basic Metabolic Panel: Recent Labs  Lab 03/05/23 1515 03/06/23 1342 03/07/23 0441  NA 132* 131* 131*  K 4.4 4.0 3.9  CL 100 100 101  CO2 25 25 23   GLUCOSE 99 96 104*  BUN 18 19 14   CREATININE 0.57 0.71 <0.30*  CALCIUM 8.7 8.7* 8.3*   Liver Function Tests: Recent Labs  Lab 03/05/23 1515 03/06/23 1342  AST 17 18  ALT 14 18  ALKPHOS 67 64  BILITOT 0.4 0.5  PROT 6.2 6.2*  ALBUMIN 3.9 3.6    CBC: Recent Labs  Lab 03/05/23 1515 03/06/23 1342 03/07/23 0230 03/07/23 0441  WBC 6.6 6.0  --   --   NEUTROABS  --  4.5  --   --   HGB 6.8 Repeated and verified X2.* 6.3* 7.2* 7.4*  HCT 21.6 Repeated and verified X2.* 21.2* 23.0* 23.7*  MCV 75.8* 77.9*  --   --   PLT 323.0 279  --   --     PATIENT left AGAINST MEDICAL ADVICE  Armando Lauman Rito Ehrlich  [facility] Pager on [email or web address]  03/08/2023, 10:54 AM

## 2023-03-07 NOTE — Progress Notes (Signed)
Pt Angela Gibbs did not want to wait her EGD schedule time at 1700-1800. She and husband said that they don't want to spend weekend time to wait. Pt Angela Gibbs signed her AMA form and put in folder. Notified Dr. Doneta Public and Dr. Rito Ehrlich.

## 2023-03-07 NOTE — Progress Notes (Addendum)
[facility] PROGRESS NOTE   Angela Gibbs ZOX:[id number] DOB: 11-01-1940 DOA: 03/06/2023  PCP: Angela Cables, MD  Brief History:  83 y.o. female with medical history significant for chronic hyponatremia, iron deficiency anemia, chronic dyspnea on exertion being admitted to the hospital with acute on chronic anemia and concern for upper GI bleeding.  She has chronic dyspnea for over the last year that has been gradually getting worse, starting a couple days ago she noticed that she had a couple of dark stools.  She did also have 1 episode of coffee-ground emesis about 1 week ago.  She was also feeling a little bit more lethargic than usual and discussed this with her PCP.  Outpatient labs showed severe anemia and she was asked to come to the emergency department.    Consultants: Gastroenterology  Procedures: Plan is for upper endoscopy today    Subjective/Interval History: Patient denies any abdominal pain.  No nausea vomiting in the hospital so far.  Denies any black-colored stools either.    Assessment/Plan:  Concern for upper GI bleed Patient had coffee-ground emesis and perhaps melanotic stools prior to admission.  Seen by gastroenterology.  Plan is for upper endoscopy today. Patient is on PPI.  Does not take any aspirin products at home.  Not on any anticoagulants. No use of NSAIDs either.  Acute blood loss anemia/severe iron deficiency Hemoglobin was noted to be 6.3.  Patient was transfused PRBC with improvement to 7.4 this morning.  Will recheck labs later today and tomorrow. Anemia panel shows ferritin 6, iron 21, TIBC 582, percent saturation 4, folic acid 27.3, B12 736. Will need iron supplements at discharge.  Essential hypertension Noted to be on irbesartan, HCTZ and nebivolol which are being continued.  Hold HCTZ due to hyponatremia. Blood pressure is reasonably well-controlled.  Hyponatremia Stable.  History of depression Continue  fluoxetine.  Obesity Estimated body mass index is 33.94 kg/m as calculated from the following:   Height as of 02/02/23: 5' 2.6" (1.59 m).   Weight as of this encounter: 85.8 kg.   DVT Prophylaxis: SCDs Code Status: Full code Family Communication: Discussed with the patient and her husband Disposition Plan: Anticipate discharge home in 24 to 48 hours  Status is: Observation The patient will require care spanning > 2 midnights and should be moved to inpatient because: Monitoring of hemoglobin.  Await upper endoscopy      Medications: Scheduled:  atorvastatin  10 mg Oral Daily   FLUoxetine  20 mg Oral Daily   hydrochlorothiazide  12.5 mg Oral Daily   irbesartan  300 mg Oral Daily   nebivolol  10 mg Oral QHS   pantoprazole (PROTONIX) IV  40 mg Intravenous Q24H   Continuous: WJX:BJYNWGNFAOZHY **OR** acetaminophen, ALPRAZolam, ondansetron **OR** ondansetron (ZOFRAN) IV, traZODone  Antibiotics: Anti-infectives (From admission, onward)    None       Objective:  Vital Signs  Vitals:   03/07/23 0229 03/07/23 0308 03/07/23 0329 03/07/23 0844  BP: (!) 165/49 (!) 152/104  (!) 123/42  Pulse: 60 65  61  Resp: 19 20  16   Temp: 98.1 F (36.7 C) 97.7 F (36.5 C)  (!) 97.4 F (36.3 C)  TempSrc: Oral Oral  Oral  SpO2: 97% 100%  98%  Weight:   85.8 kg    No intake or output data in the 24 hours ending 03/07/23 1024 Filed Weights   03/07/23 0329  Weight: 85.8 kg    General appearance: Awake alert.  In no  distress Resp: Clear to auscultation bilaterally.  Normal effort Cardio: S1-S2 is normal regular.  No S3-S4.  No rubs murmurs or bruit GI: Abdomen is soft.  Nontender nondistended.  Bowel sounds are present normal.  No masses organomegaly Extremities: No edema.  Full range of motion of lower extremities. Neurologic: Alert and oriented x3.  No focal neurological deficits.    Lab Results:  Data Reviewed: I have personally reviewed following labs and reports of the  imaging studies  CBC: Recent Labs  Lab 03/05/23 1515 03/06/23 1342 03/07/23 0230 03/07/23 0441  WBC 6.6 6.0  --   --   NEUTROABS  --  4.5  --   --   HGB 6.8 Repeated and verified X2.* 6.3* 7.2* 7.4*  HCT 21.6 Repeated and verified X2.* 21.2* 23.0* 23.7*  MCV 75.8* 77.9*  --   --   PLT 323.0 279  --   --     Basic Metabolic Panel: Recent Labs  Lab 03/05/23 1515 03/06/23 1342 03/07/23 0441  NA 132* 131* 131*  K 4.4 4.0 3.9  CL 100 100 101  CO2 25 25 23   GLUCOSE 99 96 104*  BUN 18 19 14   CREATININE 0.57 0.71 <0.30*  CALCIUM 8.7 8.7* 8.3*    GFR: CrCl cannot be calculated (This lab value cannot be used to calculate CrCl because it is not a number: <0.30).  Liver Function Tests: Recent Labs  Lab 03/05/23 1515 03/06/23 1342  AST 17 18  ALT 14 18  ALKPHOS 67 64  BILITOT 0.4 0.5  PROT 6.2 6.2*  ALBUMIN 3.9 3.6    Anemia Panel: Recent Labs    03/06/23 1342 03/06/23 1606  VITAMINB12  --  736  FOLATE  --  27.3  FERRITIN  --  6*  TIBC  --  582*  IRON  --  21*  RETICCTPCT 2.4  --     Radiology Studies: No results found.     LOS: 0 days   [name] [employer] on [email or web address]  03/07/2023, 10:24 AM

## 2023-03-08 ENCOUNTER — Encounter: Payer: Self-pay | Admitting: Family Medicine

## 2023-03-08 DIAGNOSIS — K922 Gastrointestinal hemorrhage, unspecified: Secondary | ICD-10-CM

## 2023-03-09 ENCOUNTER — Telehealth: Payer: Self-pay | Admitting: Family Medicine

## 2023-03-09 ENCOUNTER — Telehealth: Payer: Self-pay | Admitting: Neurology

## 2023-03-09 LAB — BPAM RBC
Blood Product Expiration Date: 202502112359
ISSUE DATE / TIME: 202501171722
Unit Type and Rh: 5100

## 2023-03-09 LAB — TYPE AND SCREEN
ABO/RH(D): O POS
Antibody Screen: NEGATIVE
Unit division: 0

## 2023-03-09 LAB — FECAL OCCULT BLOOD, IMMUNOCHEMICAL: Fecal Occult Bld: POSITIVE — AB

## 2023-03-09 NOTE — Telephone Encounter (Signed)
Pt's spouse dropped off letter for provider to see and have update of situation. Letter put at front office tray under providers name. Any question call pt tel [phone].

## 2023-03-09 NOTE — Telephone Encounter (Signed)
CRITICAL VALUE STICKER  CRITICAL VALUE: + IFOB  RECEIVER (on-site recipient of call): Connell Bognar  DATE & TIME NOTIFIED: 03/09/2023 8:25am  MESSENGER (representative from lab): [facility]

## 2023-03-09 NOTE — Telephone Encounter (Signed)
The patient left the hospital AMA yesterday-admitted with a GI bleed and hemoglobin of 6.3 -prior to an upper GI due to a delay in the procedure -patient states she went without eating for 24-hours and felt like she would die so she left.  She is also very upset she was in the hospital without having her toothbrush and other supplies from home.   Pt is now asking how I can fix this problem and can I get her another blood transfusion.   I explained I am not able to give her blood from my office, I am not trained nor set up to do transfusions.  I will reach out to her hematologist and see if they can help her.  Explained to patient and her husband that if hematology is not able to see her within the next couple of days we will need to get her into my office and recheck a CBC to monitor the situation.  I will place an urgent referral to her gastroenterologist who is Dr. Conley Rolls with [facility] and requesting evaluation [facility] [address] [place], [place] [address] [phone]  Please see the MyChart message reply(ies) for my assessment and plan.  The patient gave consent for this Medical Advice Message and is aware that it may result in a bill to their insurance company as well as the possibility that this may result in a co-payment or deductible. They are an established patient, but are not seeking medical advice exclusively about a problem treated during an in person or video visit in the last 7 days. I did not recommend an in person or video visit within 7 days of my reply.  I spent a total of 25 minutes cumulative time within 7 days through [employer] [name], MD

## 2023-03-10 ENCOUNTER — Other Ambulatory Visit: Payer: Self-pay

## 2023-03-10 ENCOUNTER — Encounter (HOSPITAL_COMMUNITY): Payer: Self-pay

## 2023-03-10 ENCOUNTER — Inpatient Hospital Stay (HOSPITAL_COMMUNITY)
Admission: EM | Admit: 2023-03-10 | Discharge: 2023-03-12 | DRG: 378 | Disposition: A | Discharge status: Home / Self Care | Payer: Medicare Other | Attending: Internal Medicine | Admitting: Internal Medicine

## 2023-03-10 ENCOUNTER — Encounter: Payer: Self-pay | Admitting: Family Medicine

## 2023-03-10 ENCOUNTER — Ambulatory Visit: Payer: Self-pay | Admitting: Family Medicine

## 2023-03-10 DIAGNOSIS — L4050 Arthropathic psoriasis, unspecified: Secondary | ICD-10-CM | POA: Diagnosis present

## 2023-03-10 DIAGNOSIS — M419 Scoliosis, unspecified: Secondary | ICD-10-CM | POA: Diagnosis present

## 2023-03-10 DIAGNOSIS — K219 Gastro-esophageal reflux disease without esophagitis: Secondary | ICD-10-CM | POA: Diagnosis present

## 2023-03-10 DIAGNOSIS — Z91012 Allergy to eggs: Secondary | ICD-10-CM

## 2023-03-10 DIAGNOSIS — G43909 Migraine, unspecified, not intractable, without status migrainosus: Secondary | ICD-10-CM | POA: Diagnosis present

## 2023-03-10 DIAGNOSIS — G2581 Restless legs syndrome: Secondary | ICD-10-CM | POA: Diagnosis present

## 2023-03-10 DIAGNOSIS — D500 Iron deficiency anemia secondary to blood loss (chronic): Secondary | ICD-10-CM | POA: Diagnosis not present

## 2023-03-10 DIAGNOSIS — E039 Hypothyroidism, unspecified: Secondary | ICD-10-CM | POA: Diagnosis present

## 2023-03-10 DIAGNOSIS — Z8719 Personal history of other diseases of the digestive system: Secondary | ICD-10-CM

## 2023-03-10 DIAGNOSIS — Z8619 Personal history of other infectious and parasitic diseases: Secondary | ICD-10-CM

## 2023-03-10 DIAGNOSIS — K449 Diaphragmatic hernia without obstruction or gangrene: Secondary | ICD-10-CM | POA: Diagnosis present

## 2023-03-10 DIAGNOSIS — E669 Obesity, unspecified: Secondary | ICD-10-CM | POA: Diagnosis present

## 2023-03-10 DIAGNOSIS — M8580 Other specified disorders of bone density and structure, unspecified site: Secondary | ICD-10-CM | POA: Diagnosis present

## 2023-03-10 DIAGNOSIS — J4540 Moderate persistent asthma, uncomplicated: Secondary | ICD-10-CM | POA: Diagnosis present

## 2023-03-10 DIAGNOSIS — K2970 Gastritis, unspecified, without bleeding: Secondary | ICD-10-CM | POA: Diagnosis present

## 2023-03-10 DIAGNOSIS — K3189 Other diseases of stomach and duodenum: Secondary | ICD-10-CM | POA: Diagnosis present

## 2023-03-10 DIAGNOSIS — M3500 Sicca syndrome, unspecified: Secondary | ICD-10-CM | POA: Diagnosis present

## 2023-03-10 DIAGNOSIS — Z91011 Allergy to milk products: Secondary | ICD-10-CM

## 2023-03-10 DIAGNOSIS — K222 Esophageal obstruction: Secondary | ICD-10-CM | POA: Diagnosis present

## 2023-03-10 DIAGNOSIS — I700 Atherosclerosis of aorta: Secondary | ICD-10-CM | POA: Diagnosis present

## 2023-03-10 DIAGNOSIS — Z887 Allergy status to serum and vaccine status: Secondary | ICD-10-CM

## 2023-03-10 DIAGNOSIS — Z9889 Other specified postprocedural states: Secondary | ICD-10-CM

## 2023-03-10 DIAGNOSIS — Z91048 Other nonmedicinal substance allergy status: Secondary | ICD-10-CM

## 2023-03-10 DIAGNOSIS — I10 Essential (primary) hypertension: Secondary | ICD-10-CM | POA: Diagnosis present

## 2023-03-10 DIAGNOSIS — K922 Gastrointestinal hemorrhage, unspecified: Secondary | ICD-10-CM

## 2023-03-10 DIAGNOSIS — Z888 Allergy status to other drugs, medicaments and biological substances status: Secondary | ICD-10-CM

## 2023-03-10 DIAGNOSIS — K921 Melena: Principal | ICD-10-CM | POA: Diagnosis present

## 2023-03-10 DIAGNOSIS — D649 Anemia, unspecified: Secondary | ICD-10-CM | POA: Diagnosis not present

## 2023-03-10 DIAGNOSIS — E782 Mixed hyperlipidemia: Secondary | ICD-10-CM | POA: Diagnosis present

## 2023-03-10 DIAGNOSIS — E871 Hypo-osmolality and hyponatremia: Secondary | ICD-10-CM | POA: Diagnosis present

## 2023-03-10 DIAGNOSIS — D509 Iron deficiency anemia, unspecified: Secondary | ICD-10-CM | POA: Diagnosis present

## 2023-03-10 DIAGNOSIS — Z886 Allergy status to analgesic agent status: Secondary | ICD-10-CM

## 2023-03-10 DIAGNOSIS — Z79899 Other long term (current) drug therapy: Secondary | ICD-10-CM

## 2023-03-10 DIAGNOSIS — D62 Acute posthemorrhagic anemia: Secondary | ICD-10-CM | POA: Diagnosis present

## 2023-03-10 DIAGNOSIS — E66811 Obesity, class 1: Secondary | ICD-10-CM | POA: Diagnosis not present

## 2023-03-10 DIAGNOSIS — H2513 Age-related nuclear cataract, bilateral: Secondary | ICD-10-CM | POA: Diagnosis present

## 2023-03-10 DIAGNOSIS — Z9089 Acquired absence of other organs: Secondary | ICD-10-CM

## 2023-03-10 DIAGNOSIS — Z882 Allergy status to sulfonamides status: Secondary | ICD-10-CM

## 2023-03-10 DIAGNOSIS — Z91040 Latex allergy status: Secondary | ICD-10-CM

## 2023-03-10 DIAGNOSIS — M064 Inflammatory polyarthropathy: Secondary | ICD-10-CM | POA: Diagnosis present

## 2023-03-10 DIAGNOSIS — L538 Other specified erythematous conditions: Secondary | ICD-10-CM | POA: Diagnosis present

## 2023-03-10 DIAGNOSIS — Z8673 Personal history of transient ischemic attack (TIA), and cerebral infarction without residual deficits: Secondary | ICD-10-CM

## 2023-03-10 DIAGNOSIS — Z6833 Body mass index (BMI) 33.0-33.9, adult: Secondary | ICD-10-CM | POA: Diagnosis not present

## 2023-03-10 DIAGNOSIS — Z8249 Family history of ischemic heart disease and other diseases of the circulatory system: Secondary | ICD-10-CM

## 2023-03-10 DIAGNOSIS — Z881 Allergy status to other antibiotic agents status: Secondary | ICD-10-CM

## 2023-03-10 DIAGNOSIS — Z79890 Hormone replacement therapy (postmenopausal): Secondary | ICD-10-CM

## 2023-03-10 DIAGNOSIS — Z9181 History of falling: Secondary | ICD-10-CM

## 2023-03-10 DIAGNOSIS — L409 Psoriasis, unspecified: Secondary | ICD-10-CM | POA: Diagnosis present

## 2023-03-10 DIAGNOSIS — Z91013 Allergy to seafood: Secondary | ICD-10-CM

## 2023-03-10 DIAGNOSIS — L309 Dermatitis, unspecified: Secondary | ICD-10-CM | POA: Diagnosis present

## 2023-03-10 DIAGNOSIS — G459 Transient cerebral ischemic attack, unspecified: Secondary | ICD-10-CM | POA: Diagnosis not present

## 2023-03-10 DIAGNOSIS — J984 Other disorders of lung: Secondary | ICD-10-CM | POA: Diagnosis present

## 2023-03-10 DIAGNOSIS — Z880 Allergy status to penicillin: Secondary | ICD-10-CM

## 2023-03-10 LAB — MAGNESIUM: Magnesium: 2.2 mg/dL (ref 1.7–2.4)

## 2023-03-10 LAB — COMPREHENSIVE METABOLIC PANEL
ALT: 40 U/L (ref 0–44)
AST: 34 U/L (ref 15–41)
Albumin: 3.5 g/dL (ref 3.5–5.0)
Alkaline Phosphatase: 69 U/L (ref 38–126)
Anion gap: 5 (ref 5–15)
BUN: 16 mg/dL (ref 8–23)
CO2: 24 mmol/L (ref 22–32)
Calcium: 8.8 mg/dL — ABNORMAL LOW (ref 8.9–10.3)
Chloride: 101 mmol/L (ref 98–111)
Creatinine, Ser: 0.56 mg/dL (ref 0.44–1.00)
GFR, Estimated: >60 mL/min (ref >60)
Glucose, Bld: 99 mg/dL (ref 70–99)
Potassium: 4.1 mmol/L (ref 3.5–5.1)
Sodium: 130 mmol/L — ABNORMAL LOW (ref 135–145)
Total Bilirubin: 0.6 mg/dL (ref 0.0–1.2)
Total Protein: 6.3 g/dL — ABNORMAL LOW (ref 6.5–8.1)

## 2023-03-10 LAB — PREPARE RBC (CROSSMATCH)

## 2023-03-10 LAB — CBC
HCT: 24.6 % — ABNORMAL LOW (ref 36.0–46.0)
Hemoglobin: 7.5 g/dL — ABNORMAL LOW (ref 12.0–15.0)
MCH: 24.3 pg — ABNORMAL LOW (ref 26.0–34.0)
MCHC: 30.5 g/dL (ref 30.0–36.0)
MCV: 79.6 fL — ABNORMAL LOW (ref 80.0–100.0)
Platelets: 249 10*3/uL (ref 150–400)
RBC: 3.09 MIL/uL — ABNORMAL LOW (ref 3.87–5.11)
RDW: 16.3 % — ABNORMAL HIGH (ref 11.5–15.5)
WBC: 6.9 10*3/uL (ref 4.0–10.5)
nRBC: 0 % (ref 0.0–0.2)

## 2023-03-10 LAB — PROTIME-INR
INR: 1 (ref 0.8–1.2)
Prothrombin Time: 13.6 s (ref 11.4–15.2)

## 2023-03-10 LAB — TSH: TSH: 2.238 u[IU]/mL (ref 0.350–4.500)

## 2023-03-10 LAB — PHOSPHORUS: Phosphorus: 3.8 mg/dL (ref 2.5–4.6)

## 2023-03-10 MED ORDER — ONDANSETRON HCL 4 MG PO TABS: 4.0000 mg | ORAL_TABLET | Freq: Four times a day (QID) | ORAL | Status: DC | PRN

## 2023-03-10 MED ORDER — MONTELUKAST SODIUM 10 MG PO TABS
10.0000 mg | ORAL_TABLET | Freq: Every evening | ORAL | Status: DC | PRN
  Administered 2023-03-11: 10 mg via ORAL
  Filled 2023-03-10: qty 1

## 2023-03-10 MED ORDER — ACETAMINOPHEN 325 MG PO TABS
650.0000 mg | ORAL_TABLET | Freq: Four times a day (QID) | ORAL | Status: DC | PRN
  Administered 2023-03-12: 650 mg via ORAL
  Filled 2023-03-10: qty 2

## 2023-03-10 MED ORDER — HYDROCODONE-ACETAMINOPHEN 5-325 MG PO TABS
1.0000 | ORAL_TABLET | ORAL | Status: DC | PRN
Start: 2023-03-10 — End: 2023-03-12

## 2023-03-10 MED ORDER — PANTOPRAZOLE SODIUM 40 MG IV SOLR: 80.0000 mg | Freq: Once | INTRAVENOUS | Status: DC

## 2023-03-10 MED ORDER — PANTOPRAZOLE SODIUM 40 MG IV SOLR
40.0000 mg | Freq: Two times a day (BID) | INTRAVENOUS | Status: DC
Start: 2023-03-11 — End: 2023-03-12
  Administered 2023-03-11 – 2023-03-12 (×3): 40 mg via INTRAVENOUS
  Filled 2023-03-10 (×4): qty 10

## 2023-03-10 MED ORDER — ACETAMINOPHEN 650 MG RE SUPP: 650.0000 mg | Freq: Four times a day (QID) | RECTAL | Status: DC | PRN

## 2023-03-10 MED ORDER — FLUOXETINE HCL 20 MG PO CAPS
20.0000 mg | ORAL_CAPSULE | Freq: Every day | ORAL | Status: DC
  Administered 2023-03-11: 20 mg via ORAL
  Filled 2023-03-10: qty 1

## 2023-03-10 MED ORDER — SODIUM CHLORIDE 0.9 % IV SOLN: INTRAVENOUS | Status: AC

## 2023-03-10 MED ORDER — ALPRAZOLAM 0.25 MG PO TABS
0.2500 mg | ORAL_TABLET | Freq: Two times a day (BID) | ORAL | Status: DC | PRN
  Administered 2023-03-11: 0.25 mg via ORAL
  Filled 2023-03-10: qty 1

## 2023-03-10 MED ORDER — ONDANSETRON HCL 4 MG/2ML IJ SOLN: 4.0000 mg | Freq: Four times a day (QID) | INTRAMUSCULAR | Status: DC | PRN

## 2023-03-10 MED ORDER — THYROID 30 MG PO TABS
30.0000 mg | ORAL_TABLET | Freq: Every day | ORAL | Status: DC
  Administered 2023-03-11: 30 mg via ORAL
  Filled 2023-03-10 (×3): qty 1

## 2023-03-10 MED ORDER — SODIUM CHLORIDE 0.9% IV SOLUTION: Freq: Once | INTRAVENOUS | Status: AC

## 2023-03-10 NOTE — Subjective & Objective (Signed)
Patient was admitted yesterday with GI bleed if hemoglobin is 6.3 and left AMA prior to upper GI endoscopy because it was delayed Patient been without eating for 24 hours and felt like she would die if she was left without any more food so she went home She presented to her regular doctor requesting blood transfusion but was referred to attempt to see her GI She is followed by gastroenterology at [facility] Dr. Conley Rolls After calling her GI doctor they were not able to see her promptly Patient continues to have more fatigue Her husband called primary care and was deferred to go to emergency department Patient have not had any chest pain or shortness of breath but just significant fatigue Patient has been having intermittent coffee-ground emesis and dark stools

## 2023-03-10 NOTE — Telephone Encounter (Addendum)
Copied from CRM [id number]. Topic: Clinical - Red Word Triage >> Mar 10, 2023 12:13 PM Fuller Mandril wrote: Red Word that prompted transfer to Nurse Triage: Low hemoglobin - would like to know if provider wants them to go back to ED. Sent message but has not received response.   Chief Complaint: weakness, low hemoglobin Symptoms: weakness, fatigue Frequency: continual Pertinent Negatives: Husband denies SOB, chest pain Disposition: [] 911 / [x] ED /[] Urgent Care (no appt availability in office) / [] Appointment(In office/virtual)/ []  [place] [facility]/ [] Home Care/ [] Refused Recommended Disposition /[] [place] [facility]/ []  Follow-up with PCP Additional Notes: Husband reporting that pt was in hospital for low hemoglobin recently, hemoglobin had been approx 6 then had blood transfusion which increased hemoglobin up to around 7, pt was "supposed to have endoscopy" but confirmed per pt chart pt left against medical advice after blood transfusion, husband states, "checked out of hospital" because "some things just weren't right." Husband reporting that he reached out to pt's GI doc Dr. Conley Rolls but "not able to see her." Husband reporting pt has fatigue, "think losing blood, they think, they're onto something now," regarding the doctors at hospital, husband reporting "think need to go back" to hospital. Advised pt go back to hospital asap, advised different hospital if another one close and had trouble with other one. Husband clarified that they "love the  nurses and the doctors, but the system's terrible." Advised they return to same hospital since they are more familiar with the situation. Husband verbalized understanding. Husband requesting if could have doc advise hospital to allow pt quicker admission like they did last time, husband reporting he'd not yet heard back from doc about his own message sent earlier. Advised that nurse could send [employer] message with request. Husband confirms pt not having SOB or chest  pain, but "weak as a kitten, can walk but walking gingerly." Advised they head to ED asap. Husband verbalized understanding and will take pt to hospital shortly. Delay in documentation due to nurse's Epic freezing (requiring restart) before completion of note.  Reason for Disposition  Patient sounds very sick or weak to the triager  Protocols used: Weakness (Generalized) and Fatigue-A-AH

## 2023-03-10 NOTE — ED Provider Notes (Signed)
[place] EMERGENCY DEPARTMENT AT [facility] Provider Note   CSN: [id number] Arrival date & time: 03/10/23  1723     History  Chief Complaint  Patient presents with   Rectal Bleeding    Angela Gibbs is a 83 y.o. female.  HPI     83 year old female comes in with chief complaint of persistent weakness. Patient has history of hypertension, TIA and she had come to the ER on 1-17 with complaint of bloody stools and low hemoglobin.  Patient signed out AMA on 1-18.  She indicates that she had to leave AMA because she had been waiting in the ER for over 24 hours, with endoscopy being delayed multiple times.  She was given 1 unit of blood, and she felt a lot better posttransfusion.  Once at home, she has noted persistent weakness, especially with exertion.  Patient denies any new bleeding or tarry stools.  She is not on any blood thinners.  Prior to leaving, patient was supposed to get upper endoscopy.  She is returning to the ER to get the upper endoscopy completed.  Home Medications Prior to Admission medications   Medication Sig Start Date End Date Taking? Authorizing Provider  ALPRAZolam (XANAX) 0.25 MG tablet TAKE 1 TABLET(0.25 MG) BY MOUTH TWICE DAILY AS NEEDED FOR ANXIETY Patient taking differently: Take 0.25 mg by mouth 2 (two) times daily as needed for anxiety. 02/19/23  Yes Copland, Gwenlyn Found, MD  Ascorbic Acid (VITAMIN C) 1000 MG tablet Take 1,000 mg by mouth daily.   Yes [provider]  butalbital-acetaminophen-caffeine (FIORICET) 50-325-40 MG tablet Take 1 tablet by mouth daily as needed (leg pain).   Yes [provider]  diclofenac Sodium (VOLTAREN) 1 % GEL APPLY 2 GRAMS EXTERNALLY TO THE AFFECTED AREA FOUR TIMES DAILY Patient taking differently: Apply 2 g topically 4 (four) times daily as needed (for pain). 02/22/19  Yes Kathryne Hitch, MD  FLUoxetine (PROZAC) 20 MG capsule TAKE 1 CAPSULE(20 MG) BY MOUTH DAILY Patient taking  differently: Take 20 mg by mouth daily. 01/26/23  Yes Copland, Gwenlyn Found, MD  MAGNESIUM GLYCINATE PO Take 50 mg by mouth daily.   Yes [provider]  montelukast (SINGULAIR) 10 MG tablet TAKE 1 TABLET(10 MG) BY MOUTH AT BEDTIME Patient taking differently: Take 10 mg by mouth at bedtime as needed (for wheezing). 06/18/22  Yes Copland, Gwenlyn Found, MD  nebivolol (BYSTOLIC) 10 MG tablet TAKE 1 NWGNFA(21 MG) BY MOUTH DAILY Patient taking differently: Take 10 mg by mouth at bedtime. 03/09/23  Yes Copland, Gwenlyn Found, MD  omeprazole (PRILOSEC OTC) 20 MG tablet Take 20 mg by mouth at bedtime. 12/10/10  Yes [provider]  Prednicarbate 0.1 % CREA Apply 1 Application topically as needed for itching. 03/15/12  Yes [provider]  sodium chloride (V-R NASAL SPRAY SALINE) 0.65 % nasal spray Place 1 spray into the nose as needed. Use in both nostrils twice daily Patient taking differently: Place 1 spray into the nose as needed for congestion. 06/11/16  Yes Copland, Gwenlyn Found, MD  spironolactone (ALDACTONE) 25 MG tablet TAKE 1/2 TABLET(12.5 MG) BY MOUTH DAILY Patient taking differently: Take 12.5 mg by mouth daily. 07/01/22  Yes Copland, Gwenlyn Found, MD  thyroid (ARMOUR) 30 MG tablet Take 30 mg by mouth in the morning. 11/26/21  Yes [provider]  UNKNOWN TO PATIENT Take 1 tablet by mouth See admin instructions. Unnamed OTC for restless legs: Take 1 tablet by mouth every other night  Yes [provider]  valsartan (DIOVAN) 320 MG tablet TAKE 1 TABLET(320 MG) BY MOUTH DAILY Patient taking differently: Take 320 mg by mouth in the morning. 12/22/22  Yes Copland, Gwenlyn Found, MD  atorvastatin (LIPITOR) 10 MG tablet Take 1 tablet (10 mg total) by mouth daily. Patient not taking: Reported on 03/10/2023 01/23/23 04/23/23  Revankar, Aundra Dubin, MD  Cholecalciferol 125 MCG (5000 UT) TABS Take 5,000 Units by mouth daily.    [provider]  hydrochlorothiazide (MICROZIDE) 12.5 MG  capsule Take 1 capsule (12.5 mg total) by mouth daily. Patient not taking: Reported on 03/06/2023 02/19/23 05/20/23  Revankar, Aundra Dubin, MD      Allergies    Cephalosporins; Ciprofloxacin; Influenza virus vaccine; Keflex [cephalexin]; Latex; Levaquin [levofloxacin]; Nitrofuran derivatives; Penicillins; Sulfacetamide sodium; Tape; Xopenex [levalbuterol hcl]; Zostavax [zoster vaccine live]; Erythromycin; Hydrochlorothiazide; Lisinopril; Petrolatum; Pregabalin; Ropinirole hcl; Shellfish allergy; Sulfa antibiotics; Golimumab; Ibuprofen; Lactose; Aspirin; Avelox [moxifloxacin hcl in nacl]; Cefdinir; Chromium; Egg solids, whole; Egg-derived products; Iodinated contrast media; Lactose intolerance (gi); Percodan [oxycodone-aspirin]; Prednisone; and Troleandomycin    Review of Systems   Review of Systems  All other systems reviewed and are negative.   Physical Exam Updated Vital Signs BP (!) 98/59 (BP Location: Left Arm)   Pulse 63   Temp 98.1 F (36.7 C) (Oral)   Wt 85 kg   SpO2 98%   BMI 33.62 kg/m  Physical Exam Vitals and nursing note reviewed.  Constitutional:      Appearance: She is well-developed.  HENT:     Head: Atraumatic.  Cardiovascular:     Rate and Rhythm: Normal rate.  Pulmonary:     Effort: Pulmonary effort is normal.  Musculoskeletal:     Cervical back: Normal range of motion and neck supple.  Skin:    General: Skin is warm and dry.  Neurological:     Mental Status: She is alert and oriented to person, place, and time.     ED Results / Procedures / Treatments   Labs (all labs ordered are listed, but only abnormal results are displayed) Labs Reviewed  CBC - Abnormal; Notable for the following components:      Result Value   RBC 3.09 (*)    Hemoglobin 7.5 (*)    HCT 24.6 (*)    MCV 79.6 (*)    MCH 24.3 (*)    RDW 16.3 (*)    All other components within normal limits  COMPREHENSIVE METABOLIC PANEL  MAGNESIUM  PHOSPHORUS  TSH  PROTIME-INR  TYPE AND SCREEN   PREPARE RBC (CROSSMATCH)    EKG None  Radiology No results found.  Procedures .Critical Care  Performed by: Derwood Kaplan, MD Authorized by: Derwood Kaplan, MD   Critical care provider statement:    Critical care time (minutes):  36   Critical care was necessary to treat or prevent imminent or life-threatening deterioration of the following conditions:  Circulatory failure   Critical care was time spent personally by me on the following activities:  Development of treatment plan with patient or surrogate, discussions with consultants, evaluation of patient's response to treatment, examination of patient, ordering and review of laboratory studies, ordering and review of radiographic studies, ordering and performing treatments and interventions, pulse oximetry, re-evaluation of patient's condition and review of old charts   I assumed direction of critical care for this patient from another provider in my specialty: no       Medications Ordered in ED Medications  pantoprazole (PROTONIX) injection 80 mg (  has no administration in time range)  0.9 %  sodium chloride infusion (Manually program via Guardrails IV Fluids) (has no administration in time range)    ED Course/ Medical Decision Making/ A&P                                 Medical Decision Making Amount and/or Complexity of Data Reviewed Labs: ordered.  Risk Prescription drug management. Decision regarding hospitalization.   83 year old female comes to the emergency room with chief complaint of persistent weakness, exertional shortness of breath.  She has history of recent admission for anemia, she had left AMA prior to upper endoscopy due to delay in the procedure and then being significantly tired and fatigued.  It does not appear that patient is having active bleeding.  On exam, patient has slightly low blood pressure, but heart rate is normal.  She has no abdominal discomfort.  Differential diagnosis for this  patient includes peptic ulcer disease, gastric tumor, AV malformations, internal hernia, diverticular bleed.  I have reviewed patient's records including admission H&P, GI note as well.  Plan was for them to proceed with upper endoscopy.  I also reviewed patient's labs.  Hemoglobin was over 12 4 months ago and then 6.5 at the time of admission last time.  She was given 1 unit of blood while in the ER at that time.  Reassessment: Patient's hemoglobin is 7.5, stable. I consulted Dr. Hinda Lenis GI will round on the patient tomorrow. Patient is stable for admission at this time.  I consulted medicine for admission.  We discussed wait and watch for transfusion versus giving her a unit now because of symptomatic anemia.  Given her slightly low blood pressure, Dr. Adela Glimpse is requesting that we do transfuse her a unit of blood now.  Final Clinical Impression(s) / ED Diagnoses Final diagnoses:  Symptomatic anemia    Rx / DC Orders ED Discharge Orders     None         Derwood Kaplan, MD 03/10/23 [phone]

## 2023-03-10 NOTE — Assessment & Plan Note (Signed)
-   Glasgow Blatchford score  Hg   <76F  , systolic BP <110  melena  *,    >1 Justifies admission and aggressive management      Modifying risk factors include:  Prior hx of GI bleed      -  ER  Provider sent message to to gastroenterology ( [facility] ) they will see patient in a.m. appreciate their consult   - serial CBC.    - Monitor for any recurrence,  evidence of hemodynamic instability or significant blood loss  - Transfuse as needed for hemoglobin below 7 or evidence of life-threatening bleeding  - Establish at least 2 PIV and fluid resuscitate   - clear liquids for tonight keep nothing by mouth post midnight,   -  administer Protonix   twice a day

## 2023-03-10 NOTE — Assessment & Plan Note (Signed)
-   Protonix 40 mg IV twice daily

## 2023-03-10 NOTE — Telephone Encounter (Signed)
Answer Assessment - Initial Assessment Questions 1. DESCRIPTION: "Describe how you are feeling."     Husband stating that pt weak as a kitten, can walk but walking gingerly 2. SEVERITY: "How bad is it?"  "Can you stand and walk?"   - MILD (0-3): Feels weak or tired, but does not interfere with work, school or normal activities.   - MODERATE (4-7): Able to stand and walk; weakness interferes with work, school, or normal activities.   - SEVERE (8-10): Unable to stand or walk; unable to do usual activities.     Can walk but walking gingerly 4. CAUSE: "What do you think is causing the weakness or fatigue?" (e.g., not drinking enough fluids, medical problem, trouble sleeping)     recent ED and hospital admission for low hemoglobin, pt received blood transfusion but was supposed to have endoscopy, checked out of hospital because some things just weren't right 6. OTHER SYMPTOMS: "Do you have any other symptoms?" (e.g., chest pain, fever, cough, SOB, vomiting, diarrhea, bleeding, other areas of pain)     Fatigue, think losing blood  Protocols used: Weakness (Generalized) and Fatigue-A-AH

## 2023-03-10 NOTE — Assessment & Plan Note (Signed)
Transfuse 1 unit given hypotension and severe fatigue.  Continue to follow CBC.

## 2023-03-10 NOTE — Assessment & Plan Note (Signed)
Restart Lipitor 10 mg a day

## 2023-03-10 NOTE — H&P (Signed)
Angela Gibbs:[id number] DOB: [birth year] DOA: 03/10/2023     PCP: Pearline Cables, MD   Outpatient Specialists:   CARDS:   Dr.  Tomie China    Pulmonary    Alvie Heidelberg    [facility] Vashti Hey, MD    Patient arrived to ER on 03/10/23 at 1723 Referred by Attending Derwood Kaplan, MD   Patient coming from:    home Lives  With family      Chief Complaint:   Chief Complaint  Patient presents with   Rectal Bleeding    HPI: Angela Gibbs is a 83 y.o. female with medical history significant of iron deficiency anemia chronic hyponatremia, dyspnea, depression, Restrictive lung disease,  hypertension, scoliosis and large hiatal hernia, TIA  Presented with Vere fatigue and recent anemia Patient was admitted yesterday with GI bleed if hemoglobin is 6.3 and left AMA prior to upper GI endoscopy because it was delayed Patient been without eating for 24 hours and felt like she would die if she was left without any more food so she went home She presented to her regular doctor requesting blood transfusion but was referred to attempt to see her GI She is followed by gastroenterology at [facility] Dr. Conley Rolls After calling her GI doctor they were not able to see her promptly Patient continues to have more fatigue Her husband called primary care and was deferred to go to emergency department Patient have not had any chest pain or shortness of breath but just significant fatigue Patient has been having intermittent coffee-ground emesis and dark stools   Patient has chronic dyspnea but it may have gotten a bit worse Prior to discharge hemoglobin after transfusion went up from 6.3-7.4 there after patient left AMA Anemia panel ferritin 6, iron 21, TIBC 582, percent saturation 4, folic acid 27.3, B12 736.   Denies significant ETOH intake   Does not smoke   Lab Results  Component Value Date   SARSCOV2NAA NEGATIVE 01/03/2020    Regarding pertinent  Chronic problems:   Hyperlipidemia -  on statins Lipitor (atorvastatin)  Lipid Panel     Component Value Date/Time   CHOL 196 01/22/2023 1602   TRIG 58 01/22/2023 1602   HDL 78 01/22/2023 1602   CHOLHDL 2.5 01/22/2023 1602   CHOLHDL 3 07/30/2022 1342   VLDL 20.4 07/30/2022 1342   LDLCALC 107 (H) 01/22/2023 1602   LABVLDL 11 01/22/2023 1602     HTN on irbesartan, HCTZ and nebivolol    last echo  Recent Results (from the past [id number] hours)  ECHOCARDIOGRAM COMPLETE   Collection Time: 10/29/21  3:05 PM  Result Value   S' Lateral 2.20   Area-P 1/2 2.40   Narrative      ECHOCARDIOGRAM REPORT       Patient Name:   Angela Gibbs Date of Exam: 10/29/2021 Medical Rec #:  [id number]        Height:       62.5 in Accession #:    [id number]       Weight:       184.0 lb Date of Birth:  Jun 26, 1940        BSA:          1.856 m Patient Age:    81 years         BP:           160/90 mmHg Patient Gender: F  HR:           57 bpm. Exam Location:  Outpatient  Procedure: 2D Echo, 3D Echo, Cardiac Doppler and Color Doppler  Indications:    R94.31 Abnormal EKG   History:        Patient has no prior history of Echocardiogram examinations.                 Abnormal ECG, TIA, Signs/Symptoms:Shortness of Breath and Edema;                 Risk Factors:Family History of Coronary Artery Disease and                 Hypertension. Restrictive Lung Disease.   Sonographer:    Farrel Conners RDCS Referring Phys: [id number] JESSICA C COPLAND  IMPRESSIONS    1. Left ventricular ejection fraction, by estimation, is 60 to 65%. Left ventricular ejection fraction by 3D volume is 62 %. The left ventricle has normal function. The left ventricle has no regional wall motion abnormalities. Left ventricular diastolic  parameters were normal.  2. Right ventricular systolic function is normal. The right ventricular size is normal. There is moderately elevated pulmonary artery systolic pressure. The estimated  right ventricular systolic pressure is 45.5 mmHg.  3. Left atrial size was mildly dilated.  4. The mitral valve is grossly normal. Trivial mitral valve regurgitation.  5. The aortic valve is tricuspid. There is mild calcification of the aortic valve. There is mild thickening of the aortic valve. Aortic valve regurgitation is not visualized. Aortic valve sclerosis/calcification is present, without any evidence of  aortic stenosis.  6. The inferior vena cava is normal in size with greater than 50% respiratory variability, suggesting right atrial pressure of 3 mmHg.            obesity-   BMI Readings from Last 1 Encounters:  03/10/23 33.62 kg/m    Hyponatremia  Lab Results  Component Value Date   NA 131 (L) 03/07/2023   CL 101 03/07/2023   K 3.9 03/07/2023   CO2 23 03/07/2023   BUN 14 03/07/2023   CREATININE <0.30 (L) 03/07/2023   GFRNONAA NOT CALCULATED 03/07/2023   CALCIUM 8.3 (L) 03/07/2023   ALBUMIN 3.6 03/06/2023   GLUCOSE 104 (H) 03/07/2023    Chronic anemia - baseline hg Hemoglobin & Hematocrit  Recent Labs    03/07/23 0230 03/07/23 0441 03/10/23 1822  HGB 7.2* 7.4* 7.5*   Iron/TIBC/Ferritin/ %Sat    Component Value Date/Time   IRON 21 (L) 03/06/2023 1606   TIBC 582 (H) 03/06/2023 1606   FERRITIN 6 (L) 03/06/2023 1606   IRONPCTSAT 4 (L) 03/06/2023 1606  While in ER:    Hg 7.5  Lab Orders         Comprehensive metabolic panel         CBC      Following Medications were ordered in ER: Medications  pantoprazole (PROTONIX) injection 80 mg (has no administration in time range)    _______________________________________________________ ER Provider Called:    Deboraha Sprang   Dr.Outlaw  They Recommend admit to medicine  Will see in AM   ED Triage Vitals  Encounter Vitals Group     BP 03/10/23 1729 (!) 98/59     Systolic BP Percentile --      Diastolic BP Percentile --      Pulse Rate 03/10/23 1729 63     Resp --      Temp 03/10/23 1729 98.1 F (36.7  C)      Temp Source 03/10/23 1729 Oral     SpO2 03/10/23 1729 98 %     Weight 03/10/23 1747 187 lb 6.3 oz (85 kg)     Height --      Head Circumference --      Peak Flow --      Pain Score 03/10/23 1747 0     Pain Loc --      Pain Education --      Exclude from Growth Chart --   UJWJ(19)@     _________________________________________ Significant initial  Findings: Abnormal Labs Reviewed  CBC - Abnormal; Notable for the following components:      Result Value   RBC 3.09 (*)    Hemoglobin 7.5 (*)    HCT 24.6 (*)    MCV 79.6 (*)    MCH 24.3 (*)    RDW 16.3 (*)    All other components within normal limits       ECG: Ordered    The recent clinical data is shown below. Vitals:   03/10/23 1729 03/10/23 1747  BP: (!) 98/59   Pulse: 63   Temp: 98.1 F (36.7 C)   TempSrc: Oral   SpO2: 98%   Weight:  85 kg    WBC     Component Value Date/Time   WBC 6.9 03/10/2023 1822   LYMPHSABS 0.6 (L) 03/06/2023 1342   MONOABS 0.8 03/06/2023 1342   EOSABS 0.1 03/06/2023 1342   BASOSABS 0.1 03/06/2023 1342    Results for orders placed or performed in visit on 03/06/23  Fecal occult blood, imunochemical     Status: Abnormal   Collection Time: 03/06/23  9:56 AM   Specimen: Stool  Result Value Ref Range Status   Fecal Occult Bld Positive (A) Negative Final    __________________________________________________________ Recent Labs  Lab 03/05/23 1515 03/06/23 1342 03/07/23 0441 03/10/23 1822  NA 132* 131* 131* 130*  K 4.4 4.0 3.9 4.1  CO2 25 25 23 24   GLUCOSE 99 96 104* 99  BUN 18 19 14 16   CREATININE 0.57 0.71 <0.30* 0.56  CALCIUM 8.7 8.7* 8.3* 8.8*    Cr  down from baseline see below Lab Results  Component Value Date   CREATININE 0.56 03/10/2023   CREATININE <0.30 (L) 03/07/2023   CREATININE 0.71 03/06/2023    Recent Labs  Lab 03/05/23 1515 03/06/23 1342 03/10/23 1822  AST 17 18 34  ALT 14 18 40  ALKPHOS 67 64 69  BILITOT 0.4 0.5 0.6  PROT 6.2 6.2* 6.3*  ALBUMIN 3.9  3.6 3.5   Lab Results  Component Value Date   CALCIUM 8.3 (L) 03/07/2023    Plt: Lab Results  Component Value Date   PLT 249 03/10/2023    Recent Labs  Lab 03/05/23 1515 03/06/23 1342 03/07/23 0230 03/07/23 0441 03/10/23 1822  WBC 6.6 6.0  --   --  6.9  NEUTROABS  --  4.5  --   --   --   HGB 6.8 Repeated and verified X2.* 6.3* 7.2* 7.4* 7.5*  HCT 21.6 Repeated and verified X2.* 21.2* 23.0* 23.7* 24.6*  MCV 75.8* 77.9*  --   --  79.6*  PLT 323.0 279  --   --  249    HG/HCT   stable,       Component Value Date/Time   HGB 7.5 (L) 03/10/2023 1822   HGB 12.2 10/29/2022 1134   HCT 24.6 (L) 03/10/2023 1822  MCV 79.6 (L) 03/10/2023 1822    _______________________________________________ Hospitalist was called for admission for upper gi bleed, symptomatic anemia   The following Work up has been ordered so far:  Orders Placed This Encounter  Procedures   Comprehensive metabolic panel   CBC   Consult to hospitalist   [facility] and screen  [facility]     OTHER Significant initial  Findings:  labs showing:     DM  labs:  HbA1C: Recent Labs    07/30/22 1342  HGBA1C 5.1   ___________________________________________________________________________________________________ Latest  Blood pressure (!) 98/59, pulse 63, temperature 98.1 F (36.7 C), temperature source Oral, weight 85 kg, SpO2 98%.   Vitals  labs and radiology finding personally reviewed  Review of Systems:    Pertinent positives include:   fatigue, dyspnea on exertion  Constitutional:  No weight loss, night sweats, Fevers, chills,weight loss  HEENT:  No headaches, Difficulty swallowing,Tooth/dental problems,Sore throat,  No sneezing, itching, ear ache, nasal congestion, post nasal drip,  Cardio-vascular:  No chest pain, Orthopnea, PND, anasarca, dizziness, palpitations.no Bilateral lower extremity swelling  GI:  No heartburn, indigestion, abdominal pain, nausea, vomiting, diarrhea,  change in bowel habits, loss of appetite, melena, blood in stool, hematemesis Resp:  no shortness of breath at rest. No , No excess mucus, no productive cough, No non-productive cough, No coughing up of blood.No change in color of mucus.No wheezing. Skin:  no rash or lesions. No jaundice GU:  no dysuria, change in color of urine, no urgency or frequency. No straining to urinate.  No flank pain.  Musculoskeletal:  No joint pain or no joint swelling. No decreased range of motion. No back pain.  Psych:  No change in mood or affect. No depression or anxiety. No memory loss.  Neuro: no localizing neurological complaints, no tingling, no weakness, no double vision, no gait abnormality, no slurred speech, no confusion  All systems reviewed and apart from HOPI all are negative _______________________________________________________________________________________________ Past Medical History:   Past Medical History:  Diagnosis Date   Abdominal pain, acute, right upper quadrant 07/29/2013   Allergy    Aortic atherosclerosis (HCC) 01/21/2023   Body mass index (BMI) 31.0-31.9, adult 12/13/2021   Cataracts, bilateral 06/06/2013   Chalazion of right upper eyelid 06/06/2014   Concussion    Current use of beta blocker 04/11/2016   DOE (dyspnea on exertion) 12/25/2022   Dyspnea on exertion 12/25/2021   Eczema 12/24/2022   Essential hypertension 02/20/2016   Eyelid lesion 06/06/2013   Falls    Fatigue 12/24/2022   Gastroesophageal reflux disease without esophagitis 04/11/2016   Head injury    Herpes    IDA (iron deficiency anemia) 01/11/2020   Inflammatory polyarthropathy (HCC) 12/24/2022   Iron deficiency anemia due to chronic blood loss 12/28/2019   Joint pain 12/24/2022   Lymphedema 12/24/2022   Migraines    Moderate persistent asthma without complication 05/13/2016   Nerve damage    Nuclear sclerosis of both eyes 06/06/2014   Obesity (BMI 30.0-34.9) 12/25/2021   Osteoarthritis  12/07/2019   Osteopenia 08/12/2022   Other allergic rhinitis 04/11/2016   Positive FIT (fecal immunochemical test) 12/28/2019   Psoriasis 12/07/2019   Psoriatic arthritis (HCC) 12/07/2019   Managed by rheumatology   PVD (posterior vitreous detachment), both eyes 03/19/2014   Restrictive lung disease 05/13/2016   Related to scoliosis and hiatal hernia   Retinal tear of right eye 02/13/2014   Shingles    Sjogren syndrome, unspecified (HCC) 12/24/2022   TIA (  transient ischemic attack) 05/18/2019      Past Surgical History:  Procedure Laterality Date   BRAIN SURGERY     CRANIOTOMY FOR EXTRACRANIAL-INTRACRANIAL BYPASS     EYE SURGERY  4.4.17   milia excision   HIP SURGERY     intracranial surgery   NERVE SURGERY     ROOT CANAL     TONSILLECTOMY      Social History:  Ambulatory   independently       reports that she has never smoked. She has never used smokeless tobacco. She reports current alcohol use. She reports that she does not use drugs.   Family History:  Family History  Problem Relation Age of Onset   Heart disease Father    Heart attack Father    ______________________________________________________________________________________________ Allergies: Allergies  Allergen Reactions   Cephalosporins Shortness Of Breath and Other (See Comments)    Breathing difficulty   Ciprofloxacin Hives and Shortness Of Breath   Influenza Virus Vaccine Other (See Comments)    Numbness in legs   Keflex [Cephalexin] Hives   Latex Hives, Itching and Rash   Levaquin [Levofloxacin] Hives, Palpitations and Other (See Comments)    Chest pain   Nitrofuran Derivatives Hives, Itching and Swelling   Penicillins Hives   Sulfacetamide Sodium Hives   Tape Itching and Other (See Comments)    RED AND IRRITATED SKIN   Xopenex [Levalbuterol Hcl] Anxiety and Other (See Comments)    Tremors with violent body chattering and shaking   Zostavax [Zoster Vaccine Live] Other (See Comments)     Pt has been told by specialists not to receive vaccine d/t h/o herpes in bloodstream and shingles.   Erythromycin Other (See Comments)    "stomach issues"   Hydrochlorothiazide Other (See Comments)    Migraine   Lisinopril Cough   Petrolatum Nausea Only   Pregabalin Other (See Comments)   Ropinirole Hcl Nausea And Vomiting   Shellfish Allergy Hives   Sulfa Antibiotics Hives   Golimumab Other (See Comments)    "Felt poorly" (Simponi)   Ibuprofen Other (See Comments)    Stomach upset   Lactose Other (See Comments)    "stomach issues"  Other Reaction(s): GI Intolerance   Aspirin Other (See Comments)    Stomach pain/burning   Avelox [Moxifloxacin Hcl In Nacl] Nausea Only   Cefdinir Nausea And Vomiting   Chromium Other (See Comments)    Blisters   Egg Solids, Whole Other (See Comments)    Per allergy testing, no known reaction. Pt states she was told she could probably eat 2-3 eggs weekly w/o issue.   Egg-Derived Products     Per allergy testing, no known reaction. Pt states she was told she could probably eat 2-3 eggs weekly w/o issue.   Iodinated Contrast Media Other (See Comments)    unknown    Lactose Intolerance (Gi) Other (See Comments)    "stomach issues"   Percodan [Oxycodone-Aspirin] Nausea And Vomiting   Prednisone Palpitations    No injections, can do the pills   Troleandomycin Other (See Comments)    Unknown reaction     Prior to Admission medications   Medication Sig Start Date End Date Taking? Authorizing Provider  ALPRAZolam (XANAX) 0.25 MG tablet TAKE 1 TABLET(0.25 MG) BY MOUTH TWICE DAILY AS NEEDED FOR ANXIETY Patient taking differently: Take 0.25 mg by mouth 2 (two) times daily as needed for anxiety. 02/19/23  Yes Copland, Gwenlyn Found, MD  Ascorbic Acid (VITAMIN C) 1000 MG tablet Take  1,000 mg by mouth daily.   Yes [provider]  butalbital-acetaminophen-caffeine (FIORICET) 50-325-40 MG tablet Take 1 tablet by mouth daily as needed (leg pain).   Yes  [provider]  atorvastatin (LIPITOR) 10 MG tablet Take 1 tablet (10 mg total) by mouth daily. Patient not taking: Reported on 03/10/2023 01/23/23 04/23/23  Revankar, Aundra Dubin, MD  Cholecalciferol 125 MCG (5000 UT) TABS Take 5,000 Units by mouth daily.    [provider]  diclofenac Sodium (VOLTAREN) 1 % GEL APPLY 2 GRAMS EXTERNALLY TO THE AFFECTED AREA FOUR TIMES DAILY Patient taking differently: Apply 2 g topically 4 (four) times daily. 02/22/19   Kathryne Hitch, MD  FLUoxetine (PROZAC) 20 MG capsule TAKE 1 CAPSULE(20 MG) BY MOUTH DAILY Patient taking differently: Take 20 mg by mouth daily. 01/26/23   Copland, Gwenlyn Found, MD  hydrochlorothiazide (MICROZIDE) 12.5 MG capsule Take 1 capsule (12.5 mg total) by mouth daily. Patient not taking: Reported on 03/06/2023 02/19/23 05/20/23  Revankar, Aundra Dubin, MD  MAGNESIUM GLYCINATE PO Take 50 mg by mouth daily.    [provider]  montelukast (SINGULAIR) 10 MG tablet TAKE 1 TABLET(10 MG) BY MOUTH AT BEDTIME Patient not taking: Reported on 03/06/2023 06/18/22   Copland, Gwenlyn Found, MD  nebivolol (BYSTOLIC) 10 MG tablet TAKE 1 ZOXWRU(04 MG) BY MOUTH DAILY 03/09/23   Copland, Gwenlyn Found, MD  omeprazole (PRILOSEC OTC) 20 MG tablet Take 20 mg by mouth daily after supper. 12/10/10   [provider]  Prednicarbate 0.1 % CREA Apply 1 Application topically as needed for itching. 03/15/12   [provider]  sodium chloride (V-R NASAL SPRAY SALINE) 0.65 % nasal spray Place 1 spray into the nose as needed. Use in both nostrils twice daily Patient taking differently: Place 1 spray into the nose as needed for congestion. 06/11/16   Copland, Gwenlyn Found, MD  spironolactone (ALDACTONE) 25 MG tablet TAKE 1/2 TABLET(12.5 MG) BY MOUTH DAILY Patient taking differently: Take 12.5 mg by mouth daily. 07/01/22   Copland, Gwenlyn Found, MD  thyroid (ARMOUR) 30 MG tablet Take 30 mg by mouth in the morning. 11/26/21   [provider]  UNABLE TO  FIND Take 1 tablet by mouth daily as needed. Med Name: Restless Leg Medication    [provider]  valsartan (DIOVAN) 320 MG tablet TAKE 1 TABLET(320 MG) BY MOUTH DAILY Patient taking differently: Take 320 mg by mouth in the morning. 12/22/22   CoplandGwenlyn Found, MD    ___________________________________________________________________________________________________ Physical Exam:    03/10/2023    5:47 PM 03/10/2023    5:29 PM 03/07/2023    8:44 AM  Vitals with BMI  Weight 187 lbs 6 oz    Systolic  98 123  Diastolic  59 42  Pulse  63 61     1. General:  in No  Acute distress   Chronically ill   -appearing 2. Psychological: Alert and   Oriented 3. Head/ENT:    Dry Mucous Membranes                          Head Non traumatic, neck supple                         Poor Dentition 4. SKIN: normal  Skin turgor,  Skin clean Dry and intact no rash    5. Heart: Regular rate and rhythm no  Murmur, no Rub or gallop  6. Lungs:  no wheezes or crackles   7. Abdomen: Soft,  non-tender, Non distended   obese  bowel sounds present 8. Lower extremities: no clubbing, cyanosis, 1+edema 9. Neurologically Grossly intact, moving all 4 extremities equally   10. MSK: Normal range of motion    Chart has been reviewed  ______________________________________________________________________________________________  Assessment/Plan 83 y.o. female with medical history significant of iron deficiency anemia chronic hyponatremia, dyspnea, depression, hypertension   Admitted for symptomatic anemia upper GI bleed  Present on Admission:  Upper GI bleed  Essential hypertension  Gastroesophageal reflux disease without esophagitis  IDA (iron deficiency anemia)  Mixed dyslipidemia  Obesity (BMI 30.0-34.9)  Hyponatremia    Essential hypertension Allow permissive hypertension  Gastroesophageal reflux disease without esophagitis Protonix 40 mg IV twice daily  IDA (iron deficiency  anemia) Transfuse 1 unit given hypotension and severe fatigue.  Continue to follow CBC.  Mixed dyslipidemia Restart Lipitor 10 mg a day  Obesity (BMI 30.0-34.9) Chronic stable follow-up as an outpatient nutrition  Upper GI bleed  - Glasgow Blatchford score  Hg   <7F  , systolic BP <110  melena  *,    >1 Justifies admission and aggressive management      Modifying risk factors include:  Prior hx of GI bleed      -  ER  Provider sent message to to gastroenterology ( [facility] ) they will see patient in a.m. appreciate their consult   - serial CBC.    - Monitor for any recurrence,  evidence of hemodynamic instability or significant blood loss  - Transfuse as needed for hemoglobin below 7 or evidence of life-threatening bleeding  - Establish at least 2 PIV and fluid resuscitate   - clear liquids for tonight keep nothing by mouth post midnight,   -  administer Protonix   twice a day      Hyponatremia Chronic hold hydrochlorothiazide order urine electrolytes   Other plan as per orders.  DVT prophylaxis:  SCD      Code Status:    Code Status: Prior FULL CODE  as per patient   I had personally discussed CODE STATUS with patient and   ACP   none    Family Communication:   Family  at  Bedside  plan of care was discussed   with  Husband,    Diet n.p.o. postmidnight clear liquids until then   Disposition Plan:     To home once workup is complete and patient is stable   Following barriers for discharge:                       Anemia corrected                           Will need consultants to evaluate patient prior to discharge       Consult Orders  (From admission, onward)           Start     Ordered   03/10/23 1854  Consult to hospitalist  Once       Provider:  (Not yet assigned)  Question Answer Comment  Place call to: [facility]   Reason for Consult Admit      03/10/23 1853            Would benefit from PT/OT eval prior to DC  Ordered  Consults called: [facility]   Admission status:  ED Disposition     ED Disposition  Admit   Condition  --   Comment  Hospital Area: [facility] [[id number]]  Level of Care: Progressive [102]  Admit to Progressive based on following criteria: GI, ENDOCRINE disease patients with GI bleeding, acute liver failure or pancreatitis, stable with diabetic ketoacidosis or thyrotoxicosis (hypothyroid) state.  May admit patient to [facility] or [facility] if equivalent level of care is available:: No  Covid Evaluation: Asymptomatic - no recent exposure (last 10 days) testing not required  Diagnosis: Upper GI bleed [[id number]]  Admitting Physician: Therisa Doyne [[id number]]  Attending Physician: Therisa Doyne [[id number]]  Certification:: I certify this patient will need inpatient services for at least 2 midnights  Expected Medical Readiness: 03/13/2023            inpatient     I Expect 2 midnight stay secondary to severity of patient's current illness need for inpatient interventions justified by the following:  hemodynamic instability despite optimal treatment ( hypotension  )  Severe lab/radiological/exam abnormalities including:   Anemia    That are currently affecting medical management.   I expect  patient to be hospitalized for 2 midnights requiring inpatient medical care.  Patient is at high risk for adverse outcome (such as loss of life or disability) if not treated.  Indication for inpatient stay as follows:    inability to maintain oral hydration    Need for operative/procedural  intervention    Need for  IV fluids, frequent labs    Level of care       progressive      tele indefinitely please discontinue once patient no longer qualifies COVID-19 Labs    Lab Results  Component Value Date   SARSCOV2NAA NEGATIVE 01/03/2020     Shandrell Boda 03/10/2023, 8:05 PM    [facility]     after 2 AM please page floor coverage PA If 7AM-7PM,  please contact the day team taking care of the patient using [email or web address]

## 2023-03-10 NOTE — ED Triage Notes (Signed)
C/o hemoglobin level on 1/18 7.4 and suppose to have endoscopy. Pt reports intermittent coffee ground emesis and dark stools.  Pt recently received blood transfusion and left hospital AMA Pt reports fatigue and sob. Denies cp.

## 2023-03-10 NOTE — ED Provider Triage Note (Signed)
Emergency Medicine Provider Triage Evaluation Note  Angela Gibbs , a 83 y.o. female  was evaluated in triage.  Pt complains of  Came 3 days ago for coffee ground diarrhea and emesis w/ shortness of breath. But denies emesis, diarrhea, continued bleeding, vertigo, blurry vision.  Had 1 unit of blood given Friday with improvement.  Review of Systems  Positive: See above Negative: See above  Physical Exam  BP (!) 98/59 (BP Location: Left Arm)   Pulse 63   Temp 98.1 F (36.7 C) (Oral)   Wt 85 kg   SpO2 98%   BMI 33.62 kg/m  Gen:   Awake, no distress   Resp:  Normal effort  MSK:   Moves extremities without difficulty  Other:    Medical Decision Making  Medically screening exam initiated at 6:19 PM.  Appropriate orders placed.  Angela Gibbs was informed that the remainder of the evaluation will be completed by another provider, this initial triage assessment does not replace that evaluation, and the importance of remaining in the ED until their evaluation is complete.     Lunette Stands, [place] 03/10/23 [phone]

## 2023-03-10 NOTE — Assessment & Plan Note (Signed)
Allow permissive hypertension 

## 2023-03-10 NOTE — Assessment & Plan Note (Signed)
 Chronic stable follow-up as an outpatient nutrition

## 2023-03-10 NOTE — Assessment & Plan Note (Signed)
Chronic hold hydrochlorothiazide order urine electrolytes

## 2023-03-11 ENCOUNTER — Encounter: Payer: Self-pay | Admitting: Family Medicine

## 2023-03-11 DIAGNOSIS — K922 Gastrointestinal hemorrhage, unspecified: Secondary | ICD-10-CM | POA: Diagnosis not present

## 2023-03-11 LAB — COMPREHENSIVE METABOLIC PANEL
ALT: 33 U/L (ref 0–44)
AST: 28 U/L (ref 15–41)
Albumin: 3.3 g/dL — ABNORMAL LOW (ref 3.5–5.0)
Alkaline Phosphatase: 64 U/L (ref 38–126)
Anion gap: 6 (ref 5–15)
BUN: 11 mg/dL (ref 8–23)
CO2: 24 mmol/L (ref 22–32)
Calcium: 8.6 mg/dL — ABNORMAL LOW (ref 8.9–10.3)
Chloride: 105 mmol/L (ref 98–111)
Creatinine, Ser: 0.43 mg/dL — ABNORMAL LOW (ref 0.44–1.00)
GFR, Estimated: >60 mL/min (ref >60)
Glucose, Bld: 96 mg/dL (ref 70–99)
Potassium: 3.7 mmol/L (ref 3.5–5.1)
Sodium: 135 mmol/L (ref 135–145)
Total Bilirubin: 1 mg/dL (ref 0.0–1.2)
Total Protein: 5.7 g/dL — ABNORMAL LOW (ref 6.5–8.1)

## 2023-03-11 LAB — BPAM RBC
Blood Product Expiration Date: 202502202359
ISSUE DATE / TIME: 202501212024
Unit Type and Rh: 5100

## 2023-03-11 LAB — CBC
HCT: 27.2 % — ABNORMAL LOW (ref 36.0–46.0)
HCT: 27.5 % — ABNORMAL LOW (ref 36.0–46.0)
HCT: 28.3 % — ABNORMAL LOW (ref 36.0–46.0)
Hemoglobin: 8.4 g/dL — ABNORMAL LOW (ref 12.0–15.0)
Hemoglobin: 8.4 g/dL — ABNORMAL LOW (ref 12.0–15.0)
Hemoglobin: 8.9 g/dL — ABNORMAL LOW (ref 12.0–15.0)
MCH: 25.5 pg — ABNORMAL LOW (ref 26.0–34.0)
MCH: 25.5 pg — ABNORMAL LOW (ref 26.0–34.0)
MCH: 25.6 pg — ABNORMAL LOW (ref 26.0–34.0)
MCHC: 30.5 g/dL (ref 30.0–36.0)
MCHC: 30.9 g/dL (ref 30.0–36.0)
MCHC: 31.4 g/dL (ref 30.0–36.0)
MCV: 81.6 fL (ref 80.0–100.0)
MCV: 82.4 fL (ref 80.0–100.0)
MCV: 83.6 fL (ref 80.0–100.0)
Platelets: 212 10*3/uL (ref 150–400)
Platelets: 232 10*3/uL (ref 150–400)
Platelets: 242 10*3/uL (ref 150–400)
RBC: 3.29 MIL/uL — ABNORMAL LOW (ref 3.87–5.11)
RBC: 3.3 MIL/uL — ABNORMAL LOW (ref 3.87–5.11)
RBC: 3.47 MIL/uL — ABNORMAL LOW (ref 3.87–5.11)
RDW: 16.6 % — ABNORMAL HIGH (ref 11.5–15.5)
RDW: 16.6 % — ABNORMAL HIGH (ref 11.5–15.5)
RDW: 17 % — ABNORMAL HIGH (ref 11.5–15.5)
WBC: 5.6 10*3/uL (ref 4.0–10.5)
WBC: 5.7 10*3/uL (ref 4.0–10.5)
WBC: 6.1 10*3/uL (ref 4.0–10.5)
nRBC: 0 % (ref 0.0–0.2)
nRBC: 0 % (ref 0.0–0.2)
nRBC: 0 % (ref 0.0–0.2)

## 2023-03-11 LAB — TYPE AND SCREEN
ABO/RH(D): O POS
Antibody Screen: NEGATIVE
Unit division: 0

## 2023-03-11 LAB — GLUCOSE, CAPILLARY: Glucose-Capillary: 94 mg/dL (ref 70–99)

## 2023-03-11 LAB — MAGNESIUM: Magnesium: 2.4 mg/dL (ref 1.7–2.4)

## 2023-03-11 LAB — OSMOLALITY: Osmolality: 281 mosm/kg (ref 275–295)

## 2023-03-11 LAB — PHOSPHORUS: Phosphorus: 3.8 mg/dL (ref 2.5–4.6)

## 2023-03-11 MED ORDER — HYDRALAZINE HCL 20 MG/ML IJ SOLN
10.0000 mg | Freq: Four times a day (QID) | INTRAMUSCULAR | Status: DC | PRN
  Administered 2023-03-11: 10 mg via INTRAVENOUS
  Filled 2023-03-11: qty 1

## 2023-03-11 MED ORDER — SALINE SPRAY 0.65 % NA SOLN
1.0000 | NASAL | Status: DC | PRN
  Administered 2023-03-11: 1 via NASAL
  Filled 2023-03-11: qty 44

## 2023-03-11 MED ORDER — SODIUM CHLORIDE 0.9 % IV SOLN
100.0000 mg | Freq: Once | INTRAVENOUS | Status: AC
  Administered 2023-03-11: 100 mg via INTRAVENOUS
  Filled 2023-03-11: qty 5

## 2023-03-11 MED ORDER — ORAL CARE MOUTH RINSE: 15.0000 mL | OROMUCOSAL | Status: DC | PRN

## 2023-03-11 NOTE — Plan of Care (Signed)
  Problem: Elimination: Goal: Will not experience complications related to urinary retention Outcome: Progressing   Problem: Pain Managment: Goal: General experience of comfort will improve and/or be controlled Outcome: Progressing   Problem: Safety: Goal: Ability to remain free from injury will improve Outcome: Progressing   Problem: Skin Integrity: Goal: Risk for impaired skin integrity will decrease Outcome: Progressing

## 2023-03-11 NOTE — Progress Notes (Signed)
PT Cancellation Note  Patient Details Name: Angela Gibbs MRN: [id number] DOB: 1940/08/19   Cancelled Treatment:    Reason Eval/Treat Not Completed:  Attempted PT eval-pt declined participation with PT on today-requested to rest. Will check back as schedule allows.    Faye Ramsay, PT Acute Rehabilitation  Office: [phone]

## 2023-03-11 NOTE — H&P (View-Only) (Signed)
Referring Provider: TH Primary Care Physician:  Pearline Cables, MD Primary Gastroenterologist: Gentry Fitz  Reason for Consultation: GI bleed  HPI: Angela Gibbs is a 83 y.o. female with past medical history of chronic dyspnea on exertion, iron deficiency anemia, chronic hyponatremia who was admitted to the hospital last week and left AMA presented to the hospital again with fatigue and shortness of breath.  She was scheduled for EGD with Dr. Ewing Schlein during last admission but then she left AMA.   Her hemoglobin is stable.  Her hemoglobin was down to 6.3 during previous admission but hemoglobin is 8.9 today.  Normal BUN and creatinine.  Normal LFTs. She denies any further bleeding episodes since leaving as AMA.  Continues to have shortness of breath.  Denies abdominal pain.  Denies trouble swallowing or pain while swallowing.  Has chronic reflux symptoms. Denies bright red blood per rectum.  Last colonoscopy more than 10 years ago.  Past Medical History:  Diagnosis Date   Abdominal pain, acute, right upper quadrant 07/29/2013   Allergy    Aortic atherosclerosis (HCC) 01/21/2023   Body mass index (BMI) 31.0-31.9, adult 12/13/2021   Cataracts, bilateral 06/06/2013   Chalazion of right upper eyelid 06/06/2014   Concussion    Current use of beta blocker 04/11/2016   DOE (dyspnea on exertion) 12/25/2022   Dyspnea on exertion 12/25/2021   Eczema 12/24/2022   Essential hypertension 02/20/2016   Eyelid lesion 06/06/2013   Falls    Fatigue 12/24/2022   Gastroesophageal reflux disease without esophagitis 04/11/2016   Head injury    Herpes    IDA (iron deficiency anemia) 01/11/2020   Inflammatory polyarthropathy (HCC) 12/24/2022   Iron deficiency anemia due to chronic blood loss 12/28/2019   Joint pain 12/24/2022   Lymphedema 12/24/2022   Migraines    Moderate persistent asthma without complication 05/13/2016   Nerve damage    Nuclear sclerosis of both eyes 06/06/2014   Obesity (BMI  30.0-34.9) 12/25/2021   Osteoarthritis 12/07/2019   Osteopenia 08/12/2022   Other allergic rhinitis 04/11/2016   Positive FIT (fecal immunochemical test) 12/28/2019   Psoriasis 12/07/2019   Psoriatic arthritis (HCC) 12/07/2019   Managed by rheumatology   PVD (posterior vitreous detachment), both eyes 03/19/2014   Restrictive lung disease 05/13/2016   Related to scoliosis and hiatal hernia   Retinal tear of right eye 02/13/2014   Shingles    Sjogren syndrome, unspecified (HCC) 12/24/2022   TIA (transient ischemic attack) 05/18/2019    Past Surgical History:  Procedure Laterality Date   BRAIN SURGERY     CRANIOTOMY FOR EXTRACRANIAL-INTRACRANIAL BYPASS     EYE SURGERY  4.4.17   milia excision   HIP SURGERY     intracranial surgery   NERVE SURGERY     ROOT CANAL     TONSILLECTOMY      Prior to Admission medications   Medication Sig Start Date End Date Taking? Authorizing Provider  ALPRAZolam (XANAX) 0.25 MG tablet TAKE 1 TABLET(0.25 MG) BY MOUTH TWICE DAILY AS NEEDED FOR ANXIETY Patient taking differently: Take 0.25 mg by mouth 2 (two) times daily as needed for anxiety. 02/19/23  Yes Copland, Gwenlyn Found, MD  Ascorbic Acid (VITAMIN C) 1000 MG tablet Take 1,000 mg by mouth daily.   Yes [provider]  butalbital-acetaminophen-caffeine (FIORICET) 50-325-40 MG tablet Take 1-2 tablets by mouth 2 (two) times daily as needed for headache (or leg pain).   Yes [provider]  Cholecalciferol (VITAMIN D3) 125 MCG (5000 UT) CAPS  Take 5,000 Units by mouth daily.   Yes [provider]  diclofenac Sodium (VOLTAREN) 1 % GEL APPLY 2 GRAMS EXTERNALLY TO THE AFFECTED AREA FOUR TIMES DAILY Patient taking differently: Apply 2 g topically 4 (four) times daily as needed (for pain). 02/22/19  Yes Kathryne Hitch, MD  FLUoxetine (PROZAC) 20 MG capsule TAKE 1 CAPSULE(20 MG) BY MOUTH DAILY Patient taking differently: Take 20 mg by mouth daily. 01/26/23  Yes Copland, Gwenlyn Found, MD  MAGNESIUM GLYCINATE PO Take 50 mg by mouth daily.   Yes [provider]  montelukast (SINGULAIR) 10 MG tablet TAKE 1 TABLET(10 MG) BY MOUTH AT BEDTIME Patient taking differently: Take 10 mg by mouth at bedtime as needed (for wheezing). 06/18/22  Yes Copland, Gwenlyn Found, MD  nebivolol (BYSTOLIC) 10 MG tablet TAKE 1 WUJWJX(91 MG) BY MOUTH DAILY Patient taking differently: Take 10 mg by mouth at bedtime. 03/09/23  Yes Copland, Gwenlyn Found, MD  NP THYROID 30 MG tablet Take 30 mg by mouth daily before breakfast.   Yes [provider]  omeprazole (PRILOSEC OTC) 20 MG tablet Take 20 mg by mouth at bedtime. 12/10/10  Yes [provider]  Prednicarbate 0.1 % CREA Apply 1 Application topically as needed for itching (affected sites). 03/15/12  Yes [provider]  REFRESH TEARS PF 0.5-0.9 % SOLN Place 1 drop into both eyes 3 (three) times daily as needed (for irritation).   Yes [provider]  sodium chloride (V-R NASAL SPRAY SALINE) 0.65 % nasal spray Place 1 spray into the nose as needed. Use in both nostrils twice daily Patient taking differently: Place 1 spray into the nose as needed for congestion. 06/11/16  Yes Copland, Gwenlyn Found, MD  spironolactone (ALDACTONE) 25 MG tablet TAKE 1/2 TABLET(12.5 MG) BY MOUTH DAILY Patient taking differently: Take 12.5 mg by mouth daily. 07/01/22  Yes Copland, Gwenlyn Found, MD  UNKNOWN TO PATIENT Take 1 tablet by mouth See admin instructions. Unnamed OTC for restless legs: Take 1 tablet by mouth every other night   Yes [provider]  valsartan (DIOVAN) 320 MG tablet TAKE 1 TABLET(320 MG) BY MOUTH DAILY Patient taking differently: Take 320 mg by mouth in the morning. 12/22/22  Yes Copland, Gwenlyn Found, MD  atorvastatin (LIPITOR) 10 MG tablet Take 1 tablet (10 mg total) by mouth daily. Patient not taking: Reported on 03/10/2023 01/23/23 04/23/23  Revankar, Aundra Dubin, MD  hydrochlorothiazide (MICROZIDE) 12.5 MG capsule Take 1  capsule (12.5 mg total) by mouth daily. Patient not taking: Reported on 03/06/2023 02/19/23 05/20/23  Revankar, Aundra Dubin, MD    Scheduled Meds:  FLUoxetine  20 mg Oral Daily   pantoprazole (PROTONIX) IV  40 mg Intravenous Q12H   pantoprazole (PROTONIX) IV  80 mg Intravenous Once   thyroid  30 mg Oral Q0600   Continuous Infusions: PRN Meds:.acetaminophen **OR** acetaminophen, ALPRAZolam, HYDROcodone-acetaminophen, montelukast, ondansetron **OR** ondansetron (ZOFRAN) IV, mouth rinse  Allergies as of 03/10/2023 - Review Complete 03/10/2023  Allergen Reaction Noted   Cephalosporins Shortness Of Breath and Other (See Comments) 05/23/2015   Ciprofloxacin Hives and Shortness Of Breath 11/24/2014   Influenza virus vaccine Other (See Comments) 06/29/2013   Keflex [cephalexin] Hives 11/24/2014   Latex Hives, Itching, and Rash 11/24/2014   Levaquin [levofloxacin] Hives, Palpitations, and Other (See Comments) 05/23/2015   Nitrofuran derivatives Hives, Itching, and Swelling 11/24/2014   Penicillins Hives 04/16/2013   Sulfacetamide sodium Hives 11/24/2014   Tape Itching and Other (See Comments) 03/10/2023  Xopenex [levalbuterol hcl] Anxiety and Other (See Comments) 11/24/2014   Zostavax [zoster vaccine live] Other (See Comments) 05/23/2015   Erythromycin Other (See Comments) 06/15/2013   Hydrochlorothiazide Other (See Comments) 05/23/2015   Lisinopril Cough 11/24/2014   Petrolatum Nausea Only 05/23/2015   Pregabalin Other (See Comments) 05/23/2015   Ropinirole hcl Nausea And Vomiting 12/07/2019   Shellfish allergy Hives 11/24/2014   Sulfa antibiotics Hives 06/15/2013   Golimumab Other (See Comments) 12/13/2021   Ibuprofen Other (See Comments) 12/13/2021   Lactose Other (See Comments) 05/23/2015   Aspirin Other (See Comments) 11/24/2014   Avelox [moxifloxacin hcl in nacl] Nausea Only 11/24/2014   Cefdinir Nausea And Vomiting 11/24/2014   Chromium Other (See Comments) 11/24/2014   Egg solids,  whole Other (See Comments) 05/23/2015   Egg-derived products  05/23/2015   Iodinated contrast media Other (See Comments) 06/29/2013   Lactose intolerance (gi) Other (See Comments) 05/23/2015   Percodan [oxycodone-aspirin] Nausea And Vomiting 11/24/2014   Prednisone Palpitations 11/24/2014   Troleandomycin Other (See Comments) 05/23/2015    Family History  Problem Relation Age of Onset   Heart disease Father    Heart attack Father     Social History   Socioeconomic History   Marital status: Married    Spouse name: Not on file   Number of children: Not on file   Years of education: Not on file   Highest education level: Not on file  Occupational History   Not on file  Tobacco Use   Smoking status: Never   Smokeless tobacco: Never  Vaping Use   Vaping status: Never Used  Substance and Sexual Activity   Alcohol use: Yes    Comment: social   Drug use: No   Sexual activity: Yes  Other Topics Concern   Not on file  Social History Narrative   Not on file   Social Drivers of Health   Financial Resource Strain: Low Risk  (12/11/2022)   Overall Financial Resource Strain (CARDIA)    Difficulty of Paying Living Expenses: Not hard at all  Food Insecurity: No Food Insecurity (03/11/2023)   Hunger Vital Sign    Worried About Running Out of Food in the Last Year: Never true    Ran Out of Food in the Last Year: Never true  Transportation Needs: No Transportation Needs (03/11/2023)   PRAPARE - Transportation    Lack of Transportation (Medical): No    Lack of Transportation (Non-Medical): No  Physical Activity: Sufficiently Active (12/11/2022)   Exercise Vital Sign    Days of Exercise per Week: 3 days    Minutes of Exercise per Session: 60 min  Stress: No Stress Concern Present (12/11/2022)   [employer] of Occupational Health - Occupational Stress Questionnaire    Feeling of Stress : Not at all  Social Connections: Socially Integrated (03/11/2023)   Social Connection and  Isolation Panel [NHANES]    Frequency of Communication with Friends and Family: More than three times a week    Frequency of Social Gatherings with Friends and Family: More than three times a week    Attends Religious Services: More than 4 times per year    Active Member of [employer] or Organizations: Yes    Attends Banker Meetings: More than 4 times per year    Marital Status: Married  Catering manager Violence: Not At Risk (03/11/2023)   Humiliation, Afraid, Rape, and Kick questionnaire    Fear of Current or Ex-Partner: No    Emotionally Abused:  No    Physically Abused: No    Sexually Abused: No    Review of Systems: All negative except as stated above in HPI.  Physical Exam: Vital signs: Vitals:   03/11/23 0929 03/11/23 1302  BP: (!) 159/70   Pulse: (!) 50   Resp: 16   Temp: 98.1 F (36.7 C)   SpO2: 98% 95%   Last BM Date : 03/11/23 General:   Alert,  Well-developed, well-nourished, pleasant and cooperative in NAD Lungs: No visible respiratory distress, she does have baseline dyspnea. Heart:  Regular rate and rhythm; no murmurs, clicks, rubs,  or gallops. Abdomen: Soft, nontender, nondistended, bowel sound present, no peritoneal signs Mood and affect normal Bilateral lower extremity edema noted Rectal:  Deferred  GI:  Lab Results: Recent Labs    03/11/23 0008 03/11/23 0350 03/11/23 1008  WBC 5.6 6.1 5.7  HGB 8.4* 8.4* 8.9*  HCT 27.5* 27.2* 28.3*  PLT 232 212 242   BMET Recent Labs    03/10/23 1822 03/11/23 0350  NA 130* 135  K 4.1 3.7  CL 101 105  CO2 24 24  GLUCOSE 99 96  BUN 16 11  CREATININE 0.56 0.43*  CALCIUM 8.8* 8.6*   LFT Recent Labs    03/11/23 0350  PROT 5.7*  ALBUMIN 3.3*  AST 28  ALT 33  ALKPHOS 64  BILITOT 1.0   PT/INR Recent Labs    03/10/23 2056  LABPROT 13.6  INR 1.0     Studies/Results: No results found.  Impression/Plan: -Recent upper GI bleed with coffee-ground emesis and dark stools.  Resolved  now. -Chronic iron deficiency anemia.  Was seen by Dr. Conley Rolls at [facility] in 2021 and EGD and colonoscopy was recommended but she declined workup at the time. -Chronic dyspnea on exertion  Recommendations -------------------------- -Recommend EGD tomorrow given recent history of coffee-ground emesis and dark stools. -Okay to have full liquid diet today.  Keep n.p.o. past midnight.  Risks (bleeding, infection, bowel perforation that could require surgery, sedation-related changes in cardiopulmonary systems), benefits (identification and possible treatment of source of symptoms, exclusion of certain causes of symptoms), and alternatives (watchful waiting, radiographic imaging studies, empiric medical treatment)  were explained to patient/family in detail and patient wishes to proceed.    LOS: 1 day   Kathi Der  MD, FACP 03/11/2023, 1:51 PM  Contact #  [phone]

## 2023-03-11 NOTE — TOC Initial Note (Signed)
Transition of Care [facility]) - Initial/Assessment Note   Patient Details  Name: Angela Gibbs MRN: [id number] Date of Birth: 17-Nov-1940  [facility] CM/SW Contact:    Ewing Schlein, LCSW Phone Number: 03/11/2023, 1:26 PM  Clinical Narrative: Patient is from home with spouse. PT/OT have been consulted. TOC awaiting recommendations.                 Expected Discharge Plan:  (TBD) Barriers to Discharge: Continued Medical Work up  Expected Discharge Plan and Services In-house Referral: Clinical Social Work Living arrangements for the past 2 months: Single Family Home  Prior Living Arrangements/Services Living arrangements for the past 2 months: Single Family Home Lives with:: Spouse Patient language and need for interpreter reviewed:: Yes Do you feel safe going back to the place where you live?: Yes      Need for Family Participation in Patient Care: No (Comment) Care giver support system in place?: Yes (comment) Criminal Activity/Legal Involvement Pertinent to Current Situation/Hospitalization: No - Comment as needed  Activities of Daily Living ADL Screening (condition at time of admission) Independently performs ADLs?: Yes (appropriate for developmental age) Is the patient deaf or have difficulty hearing?: Yes Does the patient have difficulty seeing, even when wearing glasses/contacts?: No Does the patient have difficulty concentrating, remembering, or making decisions?: No  Emotional Assessment Orientation: : Oriented to Self, Oriented to Place, Oriented to  Time, Oriented to Situation Alcohol / Substance Use: Not Applicable Psych Involvement: No (comment)  Admission diagnosis:  Upper GI bleed [K92.2] Symptomatic anemia [D64.9] Patient Active Problem List   Diagnosis Date Noted   Hyponatremia 03/10/2023   Upper GI bleed 03/06/2023   Mixed dyslipidemia 01/28/2023   Aortic atherosclerosis (HCC) 01/21/2023   DOE (dyspnea on exertion) 12/25/2022   Eczema  12/24/2022   Fatigue 12/24/2022   Inflammatory polyarthropathy (HCC) 12/24/2022   Joint pain 12/24/2022   Lymphedema 12/24/2022   Sjogren syndrome, unspecified (HCC) 12/24/2022   Osteopenia 08/12/2022   Dyspnea on exertion 12/25/2021   Obesity (BMI 30.0-34.9) 12/25/2021   Allergy 12/13/2021   Concussion 12/13/2021   Falls 12/13/2021   Head injury 12/13/2021   Herpes 12/13/2021   Migraines 12/13/2021   Nerve damage 12/13/2021   Shingles 12/13/2021   Body mass index (BMI) 31.0-31.9, adult 12/13/2021   IDA (iron deficiency anemia) 01/11/2020   Positive FIT (fecal immunochemical test) 12/28/2019   Iron deficiency anemia due to chronic blood loss 12/28/2019   Psoriatic arthritis (HCC) 12/07/2019   Psoriasis 12/07/2019   Osteoarthritis 12/07/2019   TIA (transient ischemic attack) 05/18/2019   Restrictive lung disease 05/13/2016   Moderate persistent asthma without complication 05/13/2016   Current use of beta blocker 04/11/2016   Other allergic rhinitis 04/11/2016   Gastroesophageal reflux disease without esophagitis 04/11/2016   Essential hypertension 02/20/2016   Chalazion of right upper eyelid 06/06/2014   Nuclear sclerosis of both eyes 06/06/2014   PVD (posterior vitreous detachment), both eyes 03/19/2014   Retinal tear of right eye 02/13/2014   Abdominal pain, acute, right upper quadrant 07/29/2013   Cataracts, bilateral 06/06/2013   Eyelid lesion 06/06/2013   PCP:  Pearline Cables, MD Pharmacy:   [facility] [phone] - [place], [place] - [address] [place] [address] Phone: [phone] Fax: [phone]  Social Drivers of Health (SDOH) Social History: SDOH Screenings   Food Insecurity: No Food Insecurity (03/11/2023)  Housing: Low Risk  (  03/11/2023)  Transportation Needs: No Transportation Needs (03/11/2023)  Utilities: Not At Risk (03/11/2023)  Alcohol Screen: Low Risk  (12/11/2022)  Depression (PHQ2-9): Low  Risk  (12/11/2022)  Financial Resource Strain: Low Risk  (12/11/2022)  Physical Activity: Sufficiently Active (12/11/2022)  Social Connections: Socially Integrated (03/11/2023)  Stress: No Stress Concern Present (12/11/2022)  Tobacco Use: Low Risk  (03/10/2023)  Health Literacy: Adequate Health Literacy (12/11/2022)   SDOH Interventions:    Readmission Risk Interventions     No data to display

## 2023-03-11 NOTE — Progress Notes (Signed)
OT Cancellation Note  Patient Details Name: Angela Gibbs MRN: [id number] DOB: 03-30-1940   Cancelled Treatment:    Reason Eval/Treat Not Completed: Fatigue/lethargy limiting ability to participate. OT will follow up tomorrow  Galen Manila 03/11/2023, 3:31 PM

## 2023-03-11 NOTE — Progress Notes (Signed)
PROGRESS NOTE    Angela Gibbs  ZOX:[id number] DOB: [birth year] DOA: 03/10/2023 PCP: Pearline Cables, MD   Brief Narrative: 83 year old with past medical history significant for iron deficiency anemia, hypertension, large hiatal hernia, TIA, hyponatremia, restrictive lung disease, recently presented to the hospital with anemia, fatigue, left AMA presents back for further evaluation.  Patient presented back for further evaluation of melena and anemia.   Assessment & Plan:   Principal Problem:   Upper GI bleed Active Problems:   Essential hypertension   Gastroesophageal reflux disease without esophagitis   IDA (iron deficiency anemia)   Obesity (BMI 30.0-34.9)   Mixed dyslipidemia   Hyponatremia   1-Melena, anemia, acute blood loss: Iron deficiency anemia -He reports an episode of black stool 2 weeks ago -GI reconsulted -Received  1 unit of packed red blood cells on admission.  Hemoglobin increased to 8 -Continue IV Protonix -Plan for endoscopy tomorrow -Will give IV iron  2-Hypertension Hold Diovan and hydrochlorothiazide, spironolactone Hold nebivolol due to bradycardia.   GERD: Continue with PPI  Obesity type II  Hyponatremia, chronic holding hydrochlorothiazide Hyperlipidemia: Resume Lipitor Hypothyroidism: Continue with Armour  Estimated body mass index is 33.62 kg/m as calculated from the following:   Height as of 02/02/23: 5' 2.6" (1.59 m).   Weight as of this encounter: 85 kg.   DVT prophylaxis: SCD Code Status: Full code Family Communication:Care discussed with patient, husband at bedside.  Disposition Plan:  Status is: Inpatient Remains inpatient appropriate because: management of GI bleed    Consultants:  [facility]  Procedures:  none  Antimicrobials:    Subjective: She had black stool 1 week ago, non since discharge.  Denies abdominal pain.    Objective: Vitals:   03/10/23 2132 03/10/23 2332 03/10/23 2334 03/11/23 0118  BP:  (!) 181/59 (!) 177/67 (!) 177/67 (!) 185/67  Pulse: (!) 53 (!) 52 (!) 52 61  Resp: 14 14 14 14   Temp: 97.8 F (36.6 C) 98 F (36.7 C) 98 F (36.7 C) 98.1 F (36.7 C)  TempSrc: Oral Oral Oral Oral  SpO2: 100% 98%  98%  Weight:        Intake/Output Summary (Last 24 hours) at 03/11/2023 0811 Last data filed at 03/11/2023 8119 Gross per 24 hour  Intake 773.38 ml  Output --  Net 773.38 ml   Filed Weights   03/10/23 1747  Weight: 85 kg    Examination:  General exam: Appears calm and comfortable  Respiratory system: Clear to auscultation. Respiratory effort normal. Cardiovascular system: S1 & S2 heard, RRR. No JVD, murmurs, rubs, gallops or clicks. No pedal edema. Gastrointestinal system: Abdomen is nondistended, soft and nontender. No organomegaly or masses felt. Normal bowel sounds heard. Central nervous system: Alert and oriented. Extremities: Symmetric 5 x 5 power.    Data Reviewed: I have personally reviewed following labs and imaging studies  CBC: Recent Labs  Lab 03/05/23 1515 03/06/23 1342 03/07/23 0230 03/07/23 0441 03/10/23 1822 03/11/23 0008 03/11/23 0350  WBC 6.6 6.0  --   --  6.9 5.6 6.1  NEUTROABS  --  4.5  --   --   --   --   --   HGB 6.8 Repeated and verified X2.* 6.3* 7.2* 7.4* 7.5* 8.4* 8.4*  HCT 21.6 Repeated and verified X2.* 21.2* 23.0* 23.7* 24.6* 27.5* 27.2*  MCV 75.8* 77.9*  --   --  79.6* 83.6 82.4  PLT 323.0 279  --   --  249 232 212  Basic Metabolic Panel: Recent Labs  Lab 03/05/23 1515 03/06/23 1342 03/07/23 0441 03/10/23 1822 03/10/23 2056 03/11/23 0350  NA 132* 131* 131* 130*  --  135  K 4.4 4.0 3.9 4.1  --  3.7  CL 100 100 101 101  --  105  CO2 25 25 23 24   --  24  GLUCOSE 99 96 104* 99  --  96  BUN 18 19 14 16   --  11  CREATININE 0.57 0.71 <0.30* 0.56  --  0.43*  CALCIUM 8.7 8.7* 8.3* 8.8*  --  8.6*  MG  --   --   --   --  2.2 2.4  PHOS  --   --   --   --  3.8 3.8   GFR: Estimated Creatinine Clearance: 55.5 mL/min (A)  (by C-G formula based on SCr of 0.43 mg/dL (L)). Liver Function Tests: Recent Labs  Lab 03/05/23 1515 03/06/23 1342 03/10/23 1822 03/11/23 0350  AST 17 18 34 28  ALT 14 18 40 33  ALKPHOS 67 64 69 64  BILITOT 0.4 0.5 0.6 1.0  PROT 6.2 6.2* 6.3* 5.7*  ALBUMIN 3.9 3.6 3.5 3.3*   No results for input(s): "LIPASE", "AMYLASE" in the last 168 hours. No results for input(s): "AMMONIA" in the last 168 hours. Coagulation Profile: Recent Labs  Lab 03/10/23 2056  INR 1.0   Cardiac Enzymes: No results for input(s): "CKTOTAL", "CKMB", "CKMBINDEX", "TROPONINI" in the last 168 hours. BNP (last 3 results) No results for input(s): "PROBNP" in the last 8760 hours. HbA1C: No results for input(s): "HGBA1C" in the last 72 hours. CBG: No results for input(s): "GLUCAP" in the last 168 hours. Lipid Profile: No results for input(s): "CHOL", "HDL", "LDLCALC", "TRIG", "CHOLHDL", "LDLDIRECT" in the last 72 hours. Thyroid Function Tests: Recent Labs    03/10/23 2056  TSH 2.238   Anemia Panel: No results for input(s): "VITAMINB12", "FOLATE", "FERRITIN", "TIBC", "IRON", "RETICCTPCT" in the last 72 hours. Sepsis Labs: No results for input(s): "PROCALCITON", "LATICACIDVEN" in the last 168 hours.  Recent Results (from the past 240 hours)  Fecal occult blood, imunochemical     Status: Abnormal   Collection Time: 03/06/23  9:56 AM   Specimen: Stool  Result Value Ref Range Status   Fecal Occult Bld Positive (A) Negative Final         Radiology Studies: No results found.      Scheduled Meds:  FLUoxetine  20 mg Oral Daily   pantoprazole (PROTONIX) IV  40 mg Intravenous Q12H   pantoprazole (PROTONIX) IV  80 mg Intravenous Once   thyroid  30 mg Oral Q0600   Continuous Infusions:  sodium chloride 75 mL/hr at 03/11/23 0608     LOS: 1 day    Time spent: 35  minutes    Rabiah Goeser A Francois Elk, MD [facility]   If 7PM-7AM, please contact  night-coverage [email or web address]  03/11/2023, 8:11 AM

## 2023-03-11 NOTE — Consult Note (Signed)
Referring Provider: TH Primary Care Physician:  Pearline Cables, MD Primary Gastroenterologist: Gentry Fitz  Reason for Consultation: GI bleed  HPI: Angela Gibbs is a 83 y.o. female with past medical history of chronic dyspnea on exertion, iron deficiency anemia, chronic hyponatremia who was admitted to the hospital last week and left AMA presented to the hospital again with fatigue and shortness of breath.  She was scheduled for EGD with Dr. Ewing Schlein during last admission but then she left AMA.   Her hemoglobin is stable.  Her hemoglobin was down to 6.3 during previous admission but hemoglobin is 8.9 today.  Normal BUN and creatinine.  Normal LFTs. She denies any further bleeding episodes since leaving as AMA.  Continues to have shortness of breath.  Denies abdominal pain.  Denies trouble swallowing or pain while swallowing.  Has chronic reflux symptoms. Denies bright red blood per rectum.  Last colonoscopy more than 10 years ago.  Past Medical History:  Diagnosis Date   Abdominal pain, acute, right upper quadrant 07/29/2013   Allergy    Aortic atherosclerosis (HCC) 01/21/2023   Body mass index (BMI) 31.0-31.9, adult 12/13/2021   Cataracts, bilateral 06/06/2013   Chalazion of right upper eyelid 06/06/2014   Concussion    Current use of beta blocker 04/11/2016   DOE (dyspnea on exertion) 12/25/2022   Dyspnea on exertion 12/25/2021   Eczema 12/24/2022   Essential hypertension 02/20/2016   Eyelid lesion 06/06/2013   Falls    Fatigue 12/24/2022   Gastroesophageal reflux disease without esophagitis 04/11/2016   Head injury    Herpes    IDA (iron deficiency anemia) 01/11/2020   Inflammatory polyarthropathy (HCC) 12/24/2022   Iron deficiency anemia due to chronic blood loss 12/28/2019   Joint pain 12/24/2022   Lymphedema 12/24/2022   Migraines    Moderate persistent asthma without complication 05/13/2016   Nerve damage    Nuclear sclerosis of both eyes 06/06/2014   Obesity (BMI  30.0-34.9) 12/25/2021   Osteoarthritis 12/07/2019   Osteopenia 08/12/2022   Other allergic rhinitis 04/11/2016   Positive FIT (fecal immunochemical test) 12/28/2019   Psoriasis 12/07/2019   Psoriatic arthritis (HCC) 12/07/2019   Managed by rheumatology   PVD (posterior vitreous detachment), both eyes 03/19/2014   Restrictive lung disease 05/13/2016   Related to scoliosis and hiatal hernia   Retinal tear of right eye 02/13/2014   Shingles    Sjogren syndrome, unspecified (HCC) 12/24/2022   TIA (transient ischemic attack) 05/18/2019    Past Surgical History:  Procedure Laterality Date   BRAIN SURGERY     CRANIOTOMY FOR EXTRACRANIAL-INTRACRANIAL BYPASS     EYE SURGERY  4.4.17   milia excision   HIP SURGERY     intracranial surgery   NERVE SURGERY     ROOT CANAL     TONSILLECTOMY      Prior to Admission medications   Medication Sig Start Date End Date Taking? Authorizing Provider  ALPRAZolam (XANAX) 0.25 MG tablet TAKE 1 TABLET(0.25 MG) BY MOUTH TWICE DAILY AS NEEDED FOR ANXIETY Patient taking differently: Take 0.25 mg by mouth 2 (two) times daily as needed for anxiety. 02/19/23  Yes Copland, Gwenlyn Found, MD  Ascorbic Acid (VITAMIN C) 1000 MG tablet Take 1,000 mg by mouth daily.   Yes [provider]  butalbital-acetaminophen-caffeine (FIORICET) 50-325-40 MG tablet Take 1-2 tablets by mouth 2 (two) times daily as needed for headache (or leg pain).   Yes [provider]  Cholecalciferol (VITAMIN D3) 125 MCG (5000 UT) CAPS  Take 5,000 Units by mouth daily.   Yes [provider]  diclofenac Sodium (VOLTAREN) 1 % GEL APPLY 2 GRAMS EXTERNALLY TO THE AFFECTED AREA FOUR TIMES DAILY Patient taking differently: Apply 2 g topically 4 (four) times daily as needed (for pain). 02/22/19  Yes Kathryne Hitch, MD  FLUoxetine (PROZAC) 20 MG capsule TAKE 1 CAPSULE(20 MG) BY MOUTH DAILY Patient taking differently: Take 20 mg by mouth daily. 01/26/23  Yes Copland, Gwenlyn Found, MD  MAGNESIUM GLYCINATE PO Take 50 mg by mouth daily.   Yes [provider]  montelukast (SINGULAIR) 10 MG tablet TAKE 1 TABLET(10 MG) BY MOUTH AT BEDTIME Patient taking differently: Take 10 mg by mouth at bedtime as needed (for wheezing). 06/18/22  Yes Copland, Gwenlyn Found, MD  nebivolol (BYSTOLIC) 10 MG tablet TAKE 1 WUJWJX(91 MG) BY MOUTH DAILY Patient taking differently: Take 10 mg by mouth at bedtime. 03/09/23  Yes Copland, Gwenlyn Found, MD  NP THYROID 30 MG tablet Take 30 mg by mouth daily before breakfast.   Yes [provider]  omeprazole (PRILOSEC OTC) 20 MG tablet Take 20 mg by mouth at bedtime. 12/10/10  Yes [provider]  Prednicarbate 0.1 % CREA Apply 1 Application topically as needed for itching (affected sites). 03/15/12  Yes [provider]  REFRESH TEARS PF 0.5-0.9 % SOLN Place 1 drop into both eyes 3 (three) times daily as needed (for irritation).   Yes [provider]  sodium chloride (V-R NASAL SPRAY SALINE) 0.65 % nasal spray Place 1 spray into the nose as needed. Use in both nostrils twice daily Patient taking differently: Place 1 spray into the nose as needed for congestion. 06/11/16  Yes Copland, Gwenlyn Found, MD  spironolactone (ALDACTONE) 25 MG tablet TAKE 1/2 TABLET(12.5 MG) BY MOUTH DAILY Patient taking differently: Take 12.5 mg by mouth daily. 07/01/22  Yes Copland, Gwenlyn Found, MD  UNKNOWN TO PATIENT Take 1 tablet by mouth See admin instructions. Unnamed OTC for restless legs: Take 1 tablet by mouth every other night   Yes [provider]  valsartan (DIOVAN) 320 MG tablet TAKE 1 TABLET(320 MG) BY MOUTH DAILY Patient taking differently: Take 320 mg by mouth in the morning. 12/22/22  Yes Copland, Gwenlyn Found, MD  atorvastatin (LIPITOR) 10 MG tablet Take 1 tablet (10 mg total) by mouth daily. Patient not taking: Reported on 03/10/2023 01/23/23 04/23/23  Revankar, Aundra Dubin, MD  hydrochlorothiazide (MICROZIDE) 12.5 MG capsule Take 1  capsule (12.5 mg total) by mouth daily. Patient not taking: Reported on 03/06/2023 02/19/23 05/20/23  Revankar, Aundra Dubin, MD    Scheduled Meds:  FLUoxetine  20 mg Oral Daily   pantoprazole (PROTONIX) IV  40 mg Intravenous Q12H   pantoprazole (PROTONIX) IV  80 mg Intravenous Once   thyroid  30 mg Oral Q0600   Continuous Infusions: PRN Meds:.acetaminophen **OR** acetaminophen, ALPRAZolam, HYDROcodone-acetaminophen, montelukast, ondansetron **OR** ondansetron (ZOFRAN) IV, mouth rinse  Allergies as of 03/10/2023 - Review Complete 03/10/2023  Allergen Reaction Noted   Cephalosporins Shortness Of Breath and Other (See Comments) 05/23/2015   Ciprofloxacin Hives and Shortness Of Breath 11/24/2014   Influenza virus vaccine Other (See Comments) 06/29/2013   Keflex [cephalexin] Hives 11/24/2014   Latex Hives, Itching, and Rash 11/24/2014   Levaquin [levofloxacin] Hives, Palpitations, and Other (See Comments) 05/23/2015   Nitrofuran derivatives Hives, Itching, and Swelling 11/24/2014   Penicillins Hives 04/16/2013   Sulfacetamide sodium Hives 11/24/2014   Tape Itching and Other (See Comments) 03/10/2023  Xopenex [levalbuterol hcl] Anxiety and Other (See Comments) 11/24/2014   Zostavax [zoster vaccine live] Other (See Comments) 05/23/2015   Erythromycin Other (See Comments) 06/15/2013   Hydrochlorothiazide Other (See Comments) 05/23/2015   Lisinopril Cough 11/24/2014   Petrolatum Nausea Only 05/23/2015   Pregabalin Other (See Comments) 05/23/2015   Ropinirole hcl Nausea And Vomiting 12/07/2019   Shellfish allergy Hives 11/24/2014   Sulfa antibiotics Hives 06/15/2013   Golimumab Other (See Comments) 12/13/2021   Ibuprofen Other (See Comments) 12/13/2021   Lactose Other (See Comments) 05/23/2015   Aspirin Other (See Comments) 11/24/2014   Avelox [moxifloxacin hcl in nacl] Nausea Only 11/24/2014   Cefdinir Nausea And Vomiting 11/24/2014   Chromium Other (See Comments) 11/24/2014   Egg solids,  whole Other (See Comments) 05/23/2015   Egg-derived products  05/23/2015   Iodinated contrast media Other (See Comments) 06/29/2013   Lactose intolerance (gi) Other (See Comments) 05/23/2015   Percodan [oxycodone-aspirin] Nausea And Vomiting 11/24/2014   Prednisone Palpitations 11/24/2014   Troleandomycin Other (See Comments) 05/23/2015    Family History  Problem Relation Age of Onset   Heart disease Father    Heart attack Father     Social History   Socioeconomic History   Marital status: Married    Spouse name: Not on file   Number of children: Not on file   Years of education: Not on file   Highest education level: Not on file  Occupational History   Not on file  Tobacco Use   Smoking status: Never   Smokeless tobacco: Never  Vaping Use   Vaping status: Never Used  Substance and Sexual Activity   Alcohol use: Yes    Comment: social   Drug use: No   Sexual activity: Yes  Other Topics Concern   Not on file  Social History Narrative   Not on file   Social Drivers of Health   Financial Resource Strain: Low Risk  (12/11/2022)   Overall Financial Resource Strain (CARDIA)    Difficulty of Paying Living Expenses: Not hard at all  Food Insecurity: No Food Insecurity (03/11/2023)   Hunger Vital Sign    Worried About Running Out of Food in the Last Year: Never true    Ran Out of Food in the Last Year: Never true  Transportation Needs: No Transportation Needs (03/11/2023)   PRAPARE - Transportation    Lack of Transportation (Medical): No    Lack of Transportation (Non-Medical): No  Physical Activity: Sufficiently Active (12/11/2022)   Exercise Vital Sign    Days of Exercise per Week: 3 days    Minutes of Exercise per Session: 60 min  Stress: No Stress Concern Present (12/11/2022)   [employer] of Occupational Health - Occupational Stress Questionnaire    Feeling of Stress : Not at all  Social Connections: Socially Integrated (03/11/2023)   Social Connection and  Isolation Panel [NHANES]    Frequency of Communication with Friends and Family: More than three times a week    Frequency of Social Gatherings with Friends and Family: More than three times a week    Attends Religious Services: More than 4 times per year    Active Member of [employer] or Organizations: Yes    Attends Banker Meetings: More than 4 times per year    Marital Status: Married  Catering manager Violence: Not At Risk (03/11/2023)   Humiliation, Afraid, Rape, and Kick questionnaire    Fear of Current or Ex-Partner: No    Emotionally Abused:  No    Physically Abused: No    Sexually Abused: No    Review of Systems: All negative except as stated above in HPI.  Physical Exam: Vital signs: Vitals:   03/11/23 0929 03/11/23 1302  BP: (!) 159/70   Pulse: (!) 50   Resp: 16   Temp: 98.1 F (36.7 C)   SpO2: 98% 95%   Last BM Date : 03/11/23 General:   Alert,  Well-developed, well-nourished, pleasant and cooperative in NAD Lungs: No visible respiratory distress, she does have baseline dyspnea. Heart:  Regular rate and rhythm; no murmurs, clicks, rubs,  or gallops. Abdomen: Soft, nontender, nondistended, bowel sound present, no peritoneal signs Mood and affect normal Bilateral lower extremity edema noted Rectal:  Deferred  GI:  Lab Results: Recent Labs    03/11/23 0008 03/11/23 0350 03/11/23 1008  WBC 5.6 6.1 5.7  HGB 8.4* 8.4* 8.9*  HCT 27.5* 27.2* 28.3*  PLT 232 212 242   BMET Recent Labs    03/10/23 1822 03/11/23 0350  NA 130* 135  K 4.1 3.7  CL 101 105  CO2 24 24  GLUCOSE 99 96  BUN 16 11  CREATININE 0.56 0.43*  CALCIUM 8.8* 8.6*   LFT Recent Labs    03/11/23 0350  PROT 5.7*  ALBUMIN 3.3*  AST 28  ALT 33  ALKPHOS 64  BILITOT 1.0   PT/INR Recent Labs    03/10/23 2056  LABPROT 13.6  INR 1.0     Studies/Results: No results found.  Impression/Plan: -Recent upper GI bleed with coffee-ground emesis and dark stools.  Resolved  now. -Chronic iron deficiency anemia.  Was seen by Dr. Conley Rolls at [facility] in 2021 and EGD and colonoscopy was recommended but she declined workup at the time. -Chronic dyspnea on exertion  Recommendations -------------------------- -Recommend EGD tomorrow given recent history of coffee-ground emesis and dark stools. -Okay to have full liquid diet today.  Keep n.p.o. past midnight.  Risks (bleeding, infection, bowel perforation that could require surgery, sedation-related changes in cardiopulmonary systems), benefits (identification and possible treatment of source of symptoms, exclusion of certain causes of symptoms), and alternatives (watchful waiting, radiographic imaging studies, empiric medical treatment)  were explained to patient/family in detail and patient wishes to proceed.    LOS: 1 day   Kathi Der  MD, FACP 03/11/2023, 1:51 PM  Contact #  [phone]

## 2023-03-12 ENCOUNTER — Encounter (HOSPITAL_COMMUNITY)
Admission: EM | Disposition: A | Discharge status: Home / Self Care | Payer: Self-pay | Source: Home / Self Care | Attending: Internal Medicine

## 2023-03-12 ENCOUNTER — Inpatient Hospital Stay (HOSPITAL_COMMUNITY): Payer: Medicare Other | Admitting: Anesthesiology

## 2023-03-12 ENCOUNTER — Ambulatory Visit (HOSPITAL_BASED_OUTPATIENT_CLINIC_OR_DEPARTMENT_OTHER): Payer: Medicare Other

## 2023-03-12 ENCOUNTER — Encounter (HOSPITAL_COMMUNITY): Payer: Self-pay | Admitting: Internal Medicine

## 2023-03-12 DIAGNOSIS — K922 Gastrointestinal hemorrhage, unspecified: Secondary | ICD-10-CM | POA: Diagnosis not present

## 2023-03-12 DIAGNOSIS — J4540 Moderate persistent asthma, uncomplicated: Secondary | ICD-10-CM

## 2023-03-12 DIAGNOSIS — I10 Essential (primary) hypertension: Secondary | ICD-10-CM | POA: Diagnosis not present

## 2023-03-12 DIAGNOSIS — K449 Diaphragmatic hernia without obstruction or gangrene: Secondary | ICD-10-CM | POA: Diagnosis not present

## 2023-03-12 DIAGNOSIS — G459 Transient cerebral ischemic attack, unspecified: Secondary | ICD-10-CM

## 2023-03-12 HISTORY — PX: BIOPSY: SHX5522

## 2023-03-12 HISTORY — PX: ESOPHAGOGASTRODUODENOSCOPY (EGD) WITH PROPOFOL: SHX5813

## 2023-03-12 LAB — CBC
HCT: 27.9 % — ABNORMAL LOW (ref 36.0–46.0)
Hemoglobin: 8.7 g/dL — ABNORMAL LOW (ref 12.0–15.0)
MCH: 25.8 pg — ABNORMAL LOW (ref 26.0–34.0)
MCHC: 31.2 g/dL (ref 30.0–36.0)
MCV: 82.8 fL (ref 80.0–100.0)
Platelets: 196 10*3/uL (ref 150–400)
RBC: 3.37 MIL/uL — ABNORMAL LOW (ref 3.87–5.11)
RDW: 16.6 % — ABNORMAL HIGH (ref 11.5–15.5)
WBC: 7 10*3/uL (ref 4.0–10.5)
nRBC: 0 % (ref 0.0–0.2)

## 2023-03-12 LAB — BASIC METABOLIC PANEL
Anion gap: 9 (ref 5–15)
BUN: 7 mg/dL — ABNORMAL LOW (ref 8–23)
CO2: 24 mmol/L (ref 22–32)
Calcium: 8.9 mg/dL (ref 8.9–10.3)
Chloride: 102 mmol/L (ref 98–111)
Creatinine, Ser: 0.6 mg/dL (ref 0.44–1.00)
GFR, Estimated: >60 mL/min (ref >60)
Glucose, Bld: 97 mg/dL (ref 70–99)
Potassium: 4 mmol/L (ref 3.5–5.1)
Sodium: 135 mmol/L (ref 135–145)

## 2023-03-12 SURGERY — ESOPHAGOGASTRODUODENOSCOPY (EGD) WITH PROPOFOL
Anesthesia: Monitor Anesthesia Care

## 2023-03-12 MED ORDER — IRBESARTAN 300 MG PO TABS
300.0000 mg | ORAL_TABLET | Freq: Every day | ORAL | Status: DC
  Administered 2023-03-12: 300 mg via ORAL
  Filled 2023-03-12: qty 1

## 2023-03-12 MED ORDER — LIDOCAINE HCL (CARDIAC) PF 100 MG/5ML IV SOSY
PREFILLED_SYRINGE | INTRAVENOUS | Status: DC | PRN
  Administered 2023-03-12: 100 mg via INTRAVENOUS

## 2023-03-12 MED ORDER — PANTOPRAZOLE SODIUM 40 MG PO TBEC: 40.0000 mg | DELAYED_RELEASE_TABLET | Freq: Every day | ORAL | 1 refills | Status: DC

## 2023-03-12 MED ORDER — PROPOFOL 500 MG/50ML IV EMUL
INTRAVENOUS | Status: DC | PRN
  Administered 2023-03-12: 40 mg via INTRAVENOUS
  Administered 2023-03-12: 90 ug/kg/min via INTRAVENOUS
  Administered 2023-03-12: 40 mg via INTRAVENOUS

## 2023-03-12 MED ORDER — PROPOFOL 500 MG/50ML IV EMUL
INTRAVENOUS | Status: AC
  Filled 2023-03-12: qty 100

## 2023-03-12 MED ORDER — SODIUM CHLORIDE 0.9 % IV SOLN: INTRAVENOUS | Status: DC | PRN

## 2023-03-12 SURGICAL SUPPLY — 14 items

## 2023-03-12 NOTE — Plan of Care (Signed)
  Problem: Clinical Measurements: Goal: Diagnostic test results will improve Outcome: Progressing Goal: Cardiovascular complication will be avoided Outcome: Progressing   Problem: Activity: Goal: Risk for activity intolerance will decrease Outcome: Progressing   Problem: Elimination: Goal: Will not experience complications related to urinary retention Outcome: Progressing   Problem: Skin Integrity: Goal: Risk for impaired skin integrity will decrease Outcome: Progressing

## 2023-03-12 NOTE — Brief Op Note (Signed)
03/12/2023  12:04 PM  PATIENT:  Angela Gibbs  83 y.o. female  PRE-OPERATIVE DIAGNOSIS:  Recent GI bleed  POST-OPERATIVE DIAGNOSIS:  large hiatal hernia; no source of bleeding  PROCEDURE:  Procedure(s): ESOPHAGOGASTRODUODENOSCOPY (EGD) WITH PROPOFOL (N/A) BIOPSY  SURGEON:  Surgeons and Role:    * Panzy Bubeck, MD - Primary  Findings ------------ -EGD showed large hiatal hernia, Schatzki's ring at GE junction and mild gastritis.  No evidence of active bleeding.  Recommendations ------------------------ -Recommended PPI to control reflux symptoms. -Recommend outpatient workup for chronic anemia which may include outpatient colonoscopy and if negative colonoscopy may consider capsule endoscopy. -No further inpatient GI workup planned.  Advance diet as tolerated.  GI will sign off.  Call us back if needed.   Kathi Der MD, FACP 03/12/2023, 12:05 PM  Contact #  [phone]

## 2023-03-12 NOTE — Anesthesia Procedure Notes (Signed)
Procedure Name: General with mask airway Date/Time: 03/12/2023 11:55 AM  Performed by: Floydene Flock, CRNAPre-anesthesia Checklist: Patient identified, Emergency Drugs available, Suction available, Patient being monitored and Timeout performed Placement Confirmation: positive ETCO2

## 2023-03-12 NOTE — Transfer of Care (Addendum)
Immediate Anesthesia Transfer of Care Note  Patient: Angela Gibbs  Procedure(s) Performed: ESOPHAGOGASTRODUODENOSCOPY (EGD) WITH PROPOFOL BIOPSY  Patient Location: PACU and Endoscopy Unit  Anesthesia Type:MAC  Level of Consciousness: drowsy  Airway & Oxygen Therapy: Patient Spontanous Breathing and Patient connected to face mask oxygen  Post-op Assessment: Report given to RN and Post -op Vital signs reviewed and stable  Post vital signs: Reviewed and stable  Last Vitals:  Vitals Value Taken Time  BP 157/76 03/12/23 1212  Temp 36.6 C 03/12/23 1210  Pulse 69 03/12/23 1216  Resp 23 03/12/23 1216  SpO2 95 % 03/12/23 1216  Vitals shown include unfiled device data.  Last Pain:  Vitals:   03/12/23 1210  TempSrc: Temporal  PainSc: 0-No pain         Complications: No notable events documented.

## 2023-03-12 NOTE — Op Note (Signed)
[facility] Patient Name: Angela Gibbs Procedure Date: 03/12/2023 MRN: [id number] Attending MD: Kathi Der , MD, [id number] Date of Birth: [birth year] CSN: [id number] Age: 83 Admit Type: Inpatient Procedure:                Upper GI endoscopy Indications:              Recent gastrointestinal bleeding Providers:                Kathi Der, MD, Weston Settle, RN, Harrington Challenger, Technician Referring MD:              Medicines:                Sedation Administered by an Anesthesia Professional Complications:            No immediate complications. Estimated Blood Loss:     Estimated blood loss was minimal. Procedure:                Pre-Anesthesia Assessment:                           - Prior to the procedure, a History and Physical                            was performed, and patient medications and                            allergies were reviewed. The patient's tolerance of                            previous anesthesia was also reviewed. The risks                            and benefits of the procedure and the sedation                            options and risks were discussed with the patient.                            All questions were answered, and informed consent                            was obtained. Prior Anticoagulants: The patient has                            taken no anticoagulant or antiplatelet agents. ASA                            Grade Assessment: III - A patient with severe                            systemic disease. After reviewing the risks and  benefits, the patient was deemed in satisfactory                            condition to undergo the procedure.                           After obtaining informed consent, the endoscope was                            passed under direct vision. Throughout the                            procedure, the patient's blood pressure, pulse, and                             oxygen saturations were monitored continuously. The                            [id number]-[id number] ([id number]) Olympus endoscope was introduced                            through the mouth, and advanced to the second part                            of duodenum. The upper GI endoscopy was                            accomplished without difficulty. The patient                            tolerated the procedure well. Scope In: Scope Out: Findings:      A non-obstructing Schatzki ring was found at the gastroesophageal       junction.      A large hiatal hernia was present.      Striped mildly erythematous mucosa was found in the prepyloric region of       the stomach. Biopsies were taken with a cold forceps for histology.      The cardia and gastric fundus were normal on retroflexion.      The duodenal bulb, first portion of the duodenum and second portion of       the duodenum were normal. Impression:               - Non-obstructing Schatzki ring.                           - Large hiatal hernia.                           - Erythematous mucosa in the prepyloric region of                            the stomach. Biopsied.                           - Normal duodenal bulb, first portion of the  duodenum and second portion of the duodenum. Moderate Sedation:      Moderate (conscious) sedation was personally administered by an       anesthesia professional. The following parameters were monitored: oxygen       saturation, heart rate, blood pressure, and response to care. Recommendation:           - Return patient to hospital ward for ongoing care.                           - Resume previous diet.                           - Continue present medications.                           - Await pathology results. Procedure Code(s):        --- Professional ---                           [phone], Esophagogastroduodenoscopy, flexible,                             transoral; with biopsy, single or multiple Diagnosis Code(s):        --- Professional ---                           K22.2, Esophageal obstruction                           K44.9, Diaphragmatic hernia without obstruction or                            gangrene                           K31.89, Other diseases of stomach and duodenum                           K92.2, Gastrointestinal hemorrhage, unspecified CPT copyright 2022 American Medical Association. All rights reserved. The codes documented in this report are preliminary and upon coder review may  be revised to meet current compliance requirements. Kathi Der, MD Kathi Der, MD 03/12/2023 12:03:35 PM Number of Addenda: 0

## 2023-03-12 NOTE — Interval H&P Note (Signed)
History and Physical Interval Note:  03/12/2023 10:49 AM  Angela Gibbs  has presented today for surgery, with the diagnosis of Recent GI bleed.  The various methods of treatment have been discussed with the patient and family. After consideration of risks, benefits and other options for treatment, the patient has consented to  Procedure(s): ESOPHAGOGASTRODUODENOSCOPY (EGD) WITH PROPOFOL (N/A) as a surgical intervention.  The patient's history has been reviewed, patient examined, no change in status, stable for surgery.  I have reviewed the patient's chart and labs.  Questions were answered to the patient's satisfaction.     Marki Frede

## 2023-03-12 NOTE — TOC Transition Note (Signed)
Transition of Care [facility]) - Discharge Note  Patient Details  Name: CARNELLA TIENDA MRN: [id number] Date of Birth: 1940/09/10  Transition of Care [facility]) CM/SW Contact:  Ewing Schlein, LCSW Phone Number: 03/12/2023, 3:50 PM  Clinical Narrative: No TOC discharge needs identified.  Final next level of care: Home/Self Care Barriers to Discharge: Barriers Resolved  Patient Goals and CMS Choice Choice offered to / list presented to : NA  Discharge Plan and Services Additional resources added to the After Visit Summary for   In-house Referral: Clinical Social Work        DME Arranged: N/A DME Agency: NA  Social Drivers of Health (SDOH) Interventions SDOH Screenings   Food Insecurity: No Food Insecurity (03/11/2023)  Housing: Low Risk  (03/11/2023)  Transportation Needs: No Transportation Needs (03/11/2023)  Utilities: Not At Risk (03/11/2023)  Alcohol Screen: Low Risk  (12/11/2022)  Depression (PHQ2-9): Low Risk  (12/11/2022)  Financial Resource Strain: Low Risk  (12/11/2022)  Physical Activity: Sufficiently Active (12/11/2022)  Social Connections: Socially Integrated (03/11/2023)  Stress: No Stress Concern Present (12/11/2022)  Tobacco Use: Low Risk  (03/12/2023)  Health Literacy: Adequate Health Literacy (12/11/2022)   Readmission Risk Interventions     No data to display

## 2023-03-12 NOTE — Progress Notes (Signed)
PHARMACIST - PHYSICIAN COMMUNICATION  DR:   Sunnie Nielsen  CONCERNING: IV to Oral Route Change Policy  RECOMMENDATION: This patient is receiving pantoprazole by the intravenous route.  Based on criteria approved by the Pharmacy and Therapeutics Committee, the intravenous medication(s) is/are being converted to the equivalent oral dose form(s).   DESCRIPTION: These criteria include: The patient is eating (either orally or via tube) and/or has been taking other orally administered medications for a least 24 hours The patient has no evidence of active gastrointestinal bleeding or impaired GI absorption (gastrectomy, short bowel, patient on TNA or NPO).  If you have questions about this conversion, please contact the Pharmacy Department  []   [phone] )  [facility] []   [phone] )  [facility] []   [phone] )  [facility] []   [phone] )  [facility] [x]   [phone] )  [facility]   [place], [facility] 03/12/2023 2:00 PM

## 2023-03-12 NOTE — Discharge Summary (Signed)
Physician Discharge Summary   Patient: Angela Gibbs MRN: [id number] DOB: 03-24-1940  Admit date:     03/10/2023  Discharge date: 03/12/23  Discharge Physician: Alba Cory   PCP: Pearline Cables, MD   Recommendations at discharge:    Needs IV iron infusion out  patient.  Needs follow up with GI, for consideration of Colonoscopy   Discharge Diagnoses: Principal Problem:   Upper GI bleed Active Problems:   Essential hypertension   Gastroesophageal reflux disease without esophagitis   IDA (iron deficiency anemia)   Obesity (BMI 30.0-34.9)   Mixed dyslipidemia   Hyponatremia  Resolved Problems:   * No resolved hospital problems. *  Hospital Course: 83 year old with past medical history significant for iron deficiency anemia, hypertension, large hiatal hernia, TIA, hyponatremia, restrictive lung disease, recently presented to the hospital with anemia, fatigue, left AMA presents back for further evaluation.   Patient presented back for further evaluation of melena and anemia.    Assessment and Plan: 1-Melena, anemia, acute blood loss: Iron deficiency anemia, unclear source of bleeding.  -He reports an episode of black stool 2 weeks ago -Received  1 unit of packed red blood cells on admission.  Hemoglobin increased to 8 -Continue IV Protonix -Underwent endoscopy showed hiatal hernia, mild gastritis.  Discharge on Protonix daily, needs out patient evaluation for anemia, might need colonoscopy.  -Received  IV iron, Hb stable at 8.7   2-Hypertension Resume Diovan  spironolactone Hold nebivolol due to bradycardia.    GERD: Continue with PPI   Obesity type II   Hyponatremia, chronic holding hydrochlorothiazide Hyperlipidemia: Resume Lipitor Hypothyroidism: Continue with Armour Obesity type 2. Needs life style modification.   Estimated body mass index is 33.62 kg/m as calculated from the following:   Height as of 02/02/23: 5' 2.6" (1.59 m).   Weight as of  this encounter: 85 kg.         Consultants: GI Procedures performed: Endoscopy  Disposition: Home Diet recommendation:  Discharge Diet Orders (From admission, onward)     Start     Ordered   03/12/23 0000  Diet - low sodium heart healthy        03/12/23 1401           Cardiac diet DISCHARGE MEDICATION: Allergies as of 03/12/2023       Reactions   Cephalosporins Shortness Of Breath, Other (See Comments)   Breathing difficulty   Ciprofloxacin Hives, Shortness Of Breath   Influenza Virus Vaccine Other (See Comments)   Numbness in legs   Keflex [cephalexin] Hives   Latex Hives, Itching, Rash   Levaquin [levofloxacin] Hives, Palpitations, Other (See Comments)   Chest pain   Nitrofuran Derivatives Hives, Itching, Swelling   Penicillins Hives   Sulfacetamide Sodium Hives   Tape Itching, Other (See Comments)   RED AND IRRITATED SKIN   Xopenex [levalbuterol Hcl] Anxiety, Other (See Comments)   Tremors with violent body chattering and shaking   Zostavax [zoster Vaccine Live] Other (See Comments)   Pt has been told by specialists not to receive vaccine d/t h/o herpes in bloodstream and shingles.   Erythromycin Other (See Comments)   "stomach issues"   Hydrochlorothiazide Other (See Comments)   Migraine   Lisinopril Cough   Petrolatum Nausea Only   Pregabalin Other (See Comments)   Ropinirole Hcl Nausea And Vomiting   Shellfish Allergy Hives   Sulfa Antibiotics Hives   Golimumab Other (See Comments)   "Felt poorly" (Simponi)   Ibuprofen Other (See  Comments)   Stomach upset   Lactose Other (See Comments)   "stomach issues" Other Reaction(s): GI Intolerance   Aspirin Other (See Comments)   Stomach pain/burning   Avelox [moxifloxacin Hcl In Nacl] Nausea Only   Cefdinir Nausea And Vomiting   Chromium Other (See Comments)   Blisters   Egg Solids, Whole Other (See Comments)   Per allergy testing, no known reaction. Pt states she was told she could probably eat 2-3  eggs weekly w/o issue.   Egg-derived Products    Per allergy testing, no known reaction. Pt states she was told she could probably eat 2-3 eggs weekly w/o issue.   Iodinated Contrast Media Other (See Comments)   unknown   Lactose Intolerance (gi) Other (See Comments)   "stomach issues"   Percodan [oxycodone-aspirin] Nausea And Vomiting   Prednisone Palpitations   No injections, can do the pills   Troleandomycin Other (See Comments)   Unknown reaction        Medication List     STOP taking these medications    diclofenac Sodium 1 % Gel Commonly known as: VOLTAREN   hydrochlorothiazide 12.5 MG capsule Commonly known as: MICROZIDE   nebivolol 10 MG tablet Commonly known as: BYSTOLIC   omeprazole 20 MG tablet Commonly known as: PRILOSEC OTC       TAKE these medications    ALPRAZolam 0.25 MG tablet Commonly known as: XANAX TAKE 1 TABLET(0.25 MG) BY MOUTH TWICE DAILY AS NEEDED FOR ANXIETY What changed: See the new instructions.   atorvastatin 10 MG tablet Commonly known as: LIPITOR Take 1 tablet (10 mg total) by mouth daily.   butalbital-acetaminophen-caffeine 50-325-40 MG tablet Commonly known as: FIORICET Take 1-2 tablets by mouth 2 (two) times daily as needed for headache (or leg pain).   FLUoxetine 20 MG capsule Commonly known as: PROZAC TAKE 1 CAPSULE(20 MG) BY MOUTH DAILY What changed: See the new instructions.   MAGNESIUM GLYCINATE PO Take 50 mg by mouth daily.   montelukast 10 MG tablet Commonly known as: SINGULAIR TAKE 1 TABLET(10 MG) BY MOUTH AT BEDTIME What changed: See the new instructions.   NP Thyroid 30 MG tablet Generic drug: thyroid Take 30 mg by mouth daily before breakfast.   pantoprazole 40 MG tablet Commonly known as: Protonix Take 1 tablet (40 mg total) by mouth daily.   Prednicarbate 0.1 % Crea Apply 1 Application topically as needed for itching (affected sites).   Refresh Tears PF 0.5-0.9 % Soln Generic drug:  Carboxymethylcell-Glycerin PF Place 1 drop into both eyes 3 (three) times daily as needed (for irritation).   sodium chloride 0.65 % nasal spray Commonly known as: V-R NASAL SPRAY SALINE Place 1 spray into the nose as needed. Use in both nostrils twice daily What changed:  reasons to take this additional instructions   spironolactone 25 MG tablet Commonly known as: ALDACTONE TAKE 1/2 TABLET(12.5 MG) BY MOUTH DAILY What changed: See the new instructions.   UNKNOWN TO PATIENT Take 1 tablet by mouth See admin instructions. Unnamed OTC for restless legs: Take 1 tablet by mouth every other night   valsartan 320 MG tablet Commonly known as: DIOVAN TAKE 1 TABLET(320 MG) BY MOUTH DAILY What changed: See the new instructions.   vitamin C 1000 MG tablet Take 1,000 mg by mouth daily.   Vitamin D3 125 MCG (5000 UT) Caps Take 5,000 Units by mouth daily.        Follow-up Information     Gastroenterology, Deboraha Sprang. Schedule an appointment  as soon as possible for a visit in 2 month(s).   Why: Follow-up for chronic anemia Contact information: [address] [place] [address] [phone]                Discharge Exam: Ceasar Mons Weights   03/10/23 1747  Weight: 85 kg   General; NAD  Condition at discharge: stable  The results of significant diagnostics from this hospitalization (including imaging, microbiology, ancillary and laboratory) are listed below for reference.   Imaging Studies: No results found.  Microbiology: Results for orders placed or performed in visit on 03/06/23  Fecal occult blood, imunochemical     Status: Abnormal   Collection Time: 03/06/23  9:56 AM   Specimen: Stool  Result Value Ref Range Status   Fecal Occult Bld Positive (A) Negative Final    Labs: CBC: Recent Labs  Lab 03/06/23 1342 03/07/23 0230 03/10/23 1822 03/11/23 0008 03/11/23 0350 03/11/23 1008 03/12/23 0353  WBC 6.0  --  6.9 5.6 6.1 5.7 7.0  NEUTROABS 4.5  --    --   --   --   --   --   HGB 6.3*   < > 7.5* 8.4* 8.4* 8.9* 8.7*  HCT 21.2*   < > 24.6* 27.5* 27.2* 28.3* 27.9*  MCV 77.9*  --  79.6* 83.6 82.4 81.6 82.8  PLT 279  --  249 232 212 242 196   < > = values in this interval not displayed.   Basic Metabolic Panel: Recent Labs  Lab 03/06/23 1342 03/07/23 0441 03/10/23 1822 03/10/23 2056 03/11/23 0350 03/12/23 0353  NA 131* 131* 130*  --  135 135  K 4.0 3.9 4.1  --  3.7 4.0  CL 100 101 101  --  105 102  CO2 25 23 24   --  24 24  GLUCOSE 96 104* 99  --  96 97  BUN 19 14 16   --  11 7*  CREATININE 0.71 <0.30* 0.56  --  0.43* 0.60  CALCIUM 8.7* 8.3* 8.8*  --  8.6* 8.9  MG  --   --   --  2.2 2.4  --   PHOS  --   --   --  3.8 3.8  --    Liver Function Tests: Recent Labs  Lab 03/05/23 1515 03/06/23 1342 03/10/23 1822 03/11/23 0350  AST 17 18 34 28  ALT 14 18 40 33  ALKPHOS 67 64 69 64  BILITOT 0.4 0.5 0.6 1.0  PROT 6.2 6.2* 6.3* 5.7*  ALBUMIN 3.9 3.6 3.5 3.3*   CBG: Recent Labs  Lab 03/11/23 1647  GLUCAP 94    Discharge time spent: greater than 30 minutes.  Signed: Alba Cory, MD [facility] 03/12/2023

## 2023-03-12 NOTE — Progress Notes (Signed)
OT Cancellation Note  Patient Details Name: Angela Gibbs MRN: [id number] DOB: 1940/05/16   Cancelled Treatment:    Reason Eval/Treat Not Completed: Patient at procedure or test/ unavailable: Pt at Endo. Will continue attempts.   Theodoro Clock 03/12/2023, 1:06 PM

## 2023-03-12 NOTE — Progress Notes (Signed)
PT Cancellation Note  Patient Details Name: Angela Gibbs MRN: [id number] DOB: [birth year]   Cancelled Treatment:    Reason Eval/Treat Not Completed: Patient at procedure or test/unavailable   Kati L Payson 03/12/2023, 11:24 AM Paulino Door, DPT Physical Therapist Acute Rehabilitation Services Office: [phone]

## 2023-03-12 NOTE — Anesthesia Preprocedure Evaluation (Signed)
Anesthesia Evaluation  Patient identified by MRN, date of birth, ID band Patient awake    Reviewed: Allergy & Precautions, NPO status , Patient's Chart, lab work & pertinent test results  Airway Mallampati: II  TM Distance: >3 FB     Dental  (+) Dental Advisory Given   Pulmonary asthma    breath sounds clear to auscultation       Cardiovascular hypertension, Pt. on medications  Rhythm:Regular Rate:Normal     Neuro/Psych TIA   GI/Hepatic Neg liver ROS,GERD  ,,  Endo/Other  negative endocrine ROS    Renal/GU      Musculoskeletal   Abdominal   Peds  Hematology  (+) Blood dyscrasia, anemia   Anesthesia Other Findings   Reproductive/Obstetrics                             Anesthesia Physical Anesthesia Plan  ASA: 3  Anesthesia Plan: MAC   Post-op Pain Management:    Induction:   PONV Risk Score and Plan: 2 and Propofol infusion and Ondansetron  Airway Management Planned: Natural Airway and Nasal Cannula  Additional Equipment:   Intra-op Plan:   Post-operative Plan:   Informed Consent: I have reviewed the patients History and Physical, chart, labs and discussed the procedure including the risks, benefits and alternatives for the proposed anesthesia with the patient or authorized representative who has indicated his/her understanding and acceptance.       Plan Discussed with:   Anesthesia Plan Comments:        Anesthesia Quick Evaluation

## 2023-03-15 ENCOUNTER — Encounter: Payer: Self-pay | Admitting: Family Medicine

## 2023-03-15 DIAGNOSIS — K922 Gastrointestinal hemorrhage, unspecified: Secondary | ICD-10-CM

## 2023-03-16 ENCOUNTER — Encounter (HOSPITAL_COMMUNITY): Payer: Self-pay | Admitting: Gastroenterology

## 2023-03-16 ENCOUNTER — Telehealth: Payer: Self-pay

## 2023-03-16 ENCOUNTER — Other Ambulatory Visit: Payer: Self-pay | Admitting: Family Medicine

## 2023-03-16 DIAGNOSIS — U071 COVID-19: Secondary | ICD-10-CM

## 2023-03-16 LAB — SURGICAL PATHOLOGY

## 2023-03-16 MED ORDER — HYDROCODONE BIT-HOMATROP MBR 5-1.5 MG/5ML PO SOLN: 5.0000 mL | Freq: Three times a day (TID) | ORAL | 0 refills | Status: AC | PRN

## 2023-03-16 MED ORDER — NIRMATRELVIR/RITONAVIR (PAXLOVID)TABLET: 3.0000 | ORAL_TABLET | Freq: Two times a day (BID) | ORAL | 0 refills | Status: AC

## 2023-03-16 NOTE — Anesthesia Postprocedure Evaluation (Signed)
Anesthesia Post Note  Patient: Angela Gibbs  Procedure(s) Performed: ESOPHAGOGASTRODUODENOSCOPY (EGD) WITH PROPOFOL BIOPSY     Patient location during evaluation: PACU Anesthesia Type: MAC Level of consciousness: awake and alert Pain management: pain level controlled Vital Signs Assessment: post-procedure vital signs reviewed and stable Respiratory status: spontaneous breathing, nonlabored ventilation, respiratory function stable and patient connected to nasal cannula oxygen Cardiovascular status: stable and blood pressure returned to baseline Postop Assessment: no apparent nausea or vomiting Anesthetic complications: no   No notable events documented.  Last Vitals:  Vitals:   03/12/23 1230 03/12/23 1254  BP: (!) 150/62 (!) 154/63  Pulse: 62 (!) 52  Resp: 19 17  Temp:  36.5 C  SpO2: 96% 98%    Last Pain:  Vitals:   03/12/23 1254  TempSrc: Oral  PainSc: 0-No pain                 Kennieth Rad

## 2023-03-16 NOTE — Telephone Encounter (Signed)
Initial Comment Caller states his wife has a headache and wants to know what she can take. Translation No Nurse Assessment Nurse: Jiles Harold, RN, Vernona Rieger Date/Time Lamount Cohen Time): 03/15/2023 6:08:48 AM Confirm and document reason for call. If symptomatic, describe symptoms. ---Caller states his wife has a headache. He took her to the hospital about 10 days ago and she had a blood transfusion and had then had to go back and got 2 more transfusions. She had Endoscopy 3 days ago. She took Butalbital about 2 hours ago and it has helped a little. He wants to know what she can take. Does the patient have any new or worsening symptoms? ---Yes Will a triage be completed? ---Yes Related visit to physician within the last 2 weeks? ---No Does the PT have any chronic conditions? (i.e. diabetes, asthma, this includes High risk factors for pregnancy, etc.) ---Yes List chronic conditions. ---hypertension Is this a behavioral health or substance abuse call? ---No Guidelines Guideline Title Affirmed Question Affirmed Notes Nurse Date/Time (Eastern Time) Headache [1] New-onset headache AND [2] age > 50 years Jiles Harold, RN, Vernona Rieger 03/15/2023 6:14:02 AM Disp. Time Lamount Cohen Time) Disposition Final User 03/15/2023 6:23:07 AM SEE PCP WITHIN 3 DAYS Yes Jiles Harold, RN, Vernona Rieger PLEASE NOTE: All timestamps contained within this report are represented as [place] Standard Time. CONFIDENTIALTY NOTICE: This fax transmission is intended only for the addressee. It contains information that is legally privileged, confidential or otherwise protected from use or disclosure. If you are not the intended recipient, you are strictly prohibited from reviewing, disclosing, copying using or disseminating any of this information or taking any action in reliance on or regarding this information. If you have received this fax in error, please notify us immediately by telephone so that we can arrange for its  return to [place]. Phone: [phone], Toll-Free: [phone], Fax: [phone] Page: 2 of 2 Call Id: [id number] Final Disposition 03/15/2023 6:23:07 AM SEE PCP WITHIN 3 DAYS Yes Jiles Harold, RN, Durenda Guthrie Disagree/Comply Comply Caller Understands Yes PreDisposition Call Doctor Care Advice Given Per Guideline SEE PCP WITHIN 3 DAYS: * You need to be seen within 2 or 3 days. * PCP VISIT: Call your doctor (or NP/PA) during regular office hours and make an appointment. A clinic or urgent care center are good places to go for care if your doctor's office is closed or you can't get an appointment. NOTE: If office will be open tomorrow, tell caller to call then, not in 3 days. COLD PACK FOR HEADACHE: * Put a cold pack or an ice bag (wrapped in a moist towel) on the forehead. * Do this for 20 minutes. CALL BACK IF: * Severe headache lasts over 2 hours after pain medicineModerate headache lasts over 24 hours * Blurred vision or double vision occurs * Numbness or weakness of the face, arm or leg * Difficulty with speaking * You become worse CARE ADVICE given per Headache (Adult) guideline. Comments User: Mertha Baars, RN Date/Time Lamount Cohen Time): 03/15/2023 6:24:46 AM https://www.drugs.com/interactions-check.php?drug_list=[email or web address],2874-0 Recommended pt take Sudafed, Mucinex or Benadryl as she states that she feels like she may have a sinus infection at the end of call. Referrals REFERRED TO PCP OFFICE

## 2023-03-16 NOTE — Telephone Encounter (Signed)

## 2023-03-16 NOTE — Addendum Note (Signed)
Addended by: [name] C on: 03/16/2023 03:55 PM   Modules accepted: Orders

## 2023-03-16 NOTE — Addendum Note (Signed)
Addended by: [name] C on: 03/16/2023 02:40 PM   Modules accepted: Orders

## 2023-03-17 ENCOUNTER — Other Ambulatory Visit: Payer: Medicare Other

## 2023-03-17 ENCOUNTER — Ambulatory Visit: Payer: Self-pay | Admitting: Family Medicine

## 2023-03-17 ENCOUNTER — Telehealth: Payer: Self-pay

## 2023-03-17 DIAGNOSIS — E785 Hyperlipidemia, unspecified: Secondary | ICD-10-CM | POA: Diagnosis not present

## 2023-03-17 DIAGNOSIS — Z888 Allergy status to other drugs, medicaments and biological substances status: Secondary | ICD-10-CM | POA: Diagnosis not present

## 2023-03-17 DIAGNOSIS — R638 Other symptoms and signs concerning food and fluid intake: Secondary | ICD-10-CM | POA: Diagnosis not present

## 2023-03-17 DIAGNOSIS — E871 Hypo-osmolality and hyponatremia: Secondary | ICD-10-CM | POA: Diagnosis not present

## 2023-03-17 DIAGNOSIS — J189 Pneumonia, unspecified organism: Secondary | ICD-10-CM | POA: Diagnosis not present

## 2023-03-17 DIAGNOSIS — R197 Diarrhea, unspecified: Secondary | ICD-10-CM | POA: Diagnosis not present

## 2023-03-17 DIAGNOSIS — K449 Diaphragmatic hernia without obstruction or gangrene: Secondary | ICD-10-CM | POA: Diagnosis not present

## 2023-03-17 DIAGNOSIS — I10 Essential (primary) hypertension: Secondary | ICD-10-CM | POA: Diagnosis not present

## 2023-03-17 DIAGNOSIS — R590 Localized enlarged lymph nodes: Secondary | ICD-10-CM | POA: Diagnosis not present

## 2023-03-17 DIAGNOSIS — Z880 Allergy status to penicillin: Secondary | ICD-10-CM | POA: Diagnosis not present

## 2023-03-17 DIAGNOSIS — R918 Other nonspecific abnormal finding of lung field: Secondary | ICD-10-CM | POA: Diagnosis not present

## 2023-03-17 DIAGNOSIS — U071 COVID-19: Secondary | ICD-10-CM | POA: Diagnosis not present

## 2023-03-17 DIAGNOSIS — I872 Venous insufficiency (chronic) (peripheral): Secondary | ICD-10-CM | POA: Diagnosis not present

## 2023-03-17 DIAGNOSIS — Z881 Allergy status to other antibiotic agents status: Secondary | ICD-10-CM | POA: Diagnosis not present

## 2023-03-17 DIAGNOSIS — F32A Depression, unspecified: Secondary | ICD-10-CM | POA: Diagnosis not present

## 2023-03-17 DIAGNOSIS — D509 Iron deficiency anemia, unspecified: Secondary | ICD-10-CM | POA: Diagnosis not present

## 2023-03-17 DIAGNOSIS — E739 Lactose intolerance, unspecified: Secondary | ICD-10-CM | POA: Diagnosis not present

## 2023-03-17 DIAGNOSIS — Z886 Allergy status to analgesic agent status: Secondary | ICD-10-CM | POA: Diagnosis not present

## 2023-03-17 DIAGNOSIS — D649 Anemia, unspecified: Secondary | ICD-10-CM | POA: Diagnosis not present

## 2023-03-17 DIAGNOSIS — J1282 Pneumonia due to coronavirus disease 2019: Secondary | ICD-10-CM | POA: Diagnosis not present

## 2023-03-17 DIAGNOSIS — R0602 Shortness of breath: Secondary | ICD-10-CM | POA: Diagnosis not present

## 2023-03-17 DIAGNOSIS — Z8719 Personal history of other diseases of the digestive system: Secondary | ICD-10-CM | POA: Diagnosis not present

## 2023-03-17 DIAGNOSIS — D72819 Decreased white blood cell count, unspecified: Secondary | ICD-10-CM | POA: Diagnosis not present

## 2023-03-17 DIAGNOSIS — H9190 Unspecified hearing loss, unspecified ear: Secondary | ICD-10-CM | POA: Diagnosis not present

## 2023-03-17 DIAGNOSIS — T447X5A Adverse effect of beta-adrenoreceptor antagonists, initial encounter: Secondary | ICD-10-CM | POA: Diagnosis not present

## 2023-03-17 DIAGNOSIS — E039 Hypothyroidism, unspecified: Secondary | ICD-10-CM | POA: Diagnosis not present

## 2023-03-17 DIAGNOSIS — Z887 Allergy status to serum and vaccine status: Secondary | ICD-10-CM | POA: Diagnosis not present

## 2023-03-17 DIAGNOSIS — T500X5A Adverse effect of mineralocorticoids and their antagonists, initial encounter: Secondary | ICD-10-CM | POA: Diagnosis not present

## 2023-03-17 DIAGNOSIS — R001 Bradycardia, unspecified: Secondary | ICD-10-CM | POA: Diagnosis not present

## 2023-03-17 DIAGNOSIS — F419 Anxiety disorder, unspecified: Secondary | ICD-10-CM | POA: Diagnosis not present

## 2023-03-17 DIAGNOSIS — Z882 Allergy status to sulfonamides status: Secondary | ICD-10-CM | POA: Diagnosis not present

## 2023-03-17 NOTE — Telephone Encounter (Signed)
Copied from CRM [id number]. Topic: Clinical - Red Word Triage >> Mar 17, 2023 11:19 AM Corin V wrote: [facility] that prompted transfer to Nurse Triage: Patient's sister, Erskine Squibb, is calling and was asking about having a home health nurse come out to assist the patient. Her hemoglobin is low and was a 8.4 at her recent discharge. She is weak, tested positive for COVID. She has blood in her stool and is unable to control her bowel movements. Her bowel movements are black. Her recent endoscopy found no blood in the GI tract.   Chief Complaint: Possible rectal bleeding Symptoms: Dark stool, incontinence of stool, shortness of breath, COVID+ Frequency: 8 stools today Pertinent Negatives: Patient denies red blood seen Disposition: [x] ED /[] Urgent Care (no appt availability in office) / [] Appointment(In office/virtual)/ []  [facility]/ [] Home Care/ [] Refused Recommended Disposition /[] [facility]/ []  Follow-up with PCP Additional Notes: Patient's sister Erskine Squibb called stating that the patient was seen last week in the hospital for rectal bleeding and required 3 units of blood. She states that today the patient's husband called her to let her know that the patient is incontinent of stool and is having dark colored stools again. She states the patient is also COVID+. I advised that with the dark stools and the recent treatment for rectal bleeding that the patient should go back to the ED for evaluation and treatment if needed. I have called the patient's husband and informed him of this as well and he is agreeable with this plan.     Reason for Disposition  Black or tarry bowel movements  (Exception: Chronic-unchanged black-grey BMs AND is taking iron pills or Pepto-Bismol.)  Answer Assessment - Initial Assessment Questions 1. APPEARANCE of BLOOD: "What color is it?" "Is it passed separately, on the surface of the stool, or mixed in with the stool?"      Black 2. AMOUNT: "How much blood was  passed?"      8 bloody bowel movement she could not control, not diarrhea  3. FREQUENCY: "How many times has blood been passed with the stools?"      Frequent 4. ONSET: "When was the blood first seen in the stools?" (Days or weeks)      Today 5. DIARRHEA: "Is there also some diarrhea?" If Yes, ask: "How many diarrhea stools in the past 24 hours?"      No 6. CONSTIPATION: "Do you have constipation?" If Yes, ask: "How bad is it?"     No 7. RECURRENT SYMPTOMS: "Have you had blood in your stools before?" If Yes, ask: "When was the last time?" and "What happened that time?"      Yes 8. BLOOD THINNERS: "Do you take any blood thinners?" (e.g., Coumadin/warfarin, Pradaxa/dabigatran, aspirin)     No 9. OTHER SYMPTOMS: "Do you have any other symptoms?"  (e.g., abdomen pain, vomiting, dizziness, fever)     Fatigue, recently diagnosed with COVID 10. PREGNANCY: "Is there any chance you are pregnant?" "When was your last menstrual period?"       No  Protocols used: Rectal Bleeding-A-AH

## 2023-03-17 NOTE — Telephone Encounter (Signed)
Pt triaged to ED

## 2023-03-17 NOTE — Telephone Encounter (Signed)
Called and spoke with husband.  He is concerned that Jaelin is not doing okay, she seems very weak and also has COVID.  He is concerned that her blood counts may have dropped again, notes he is having diarrhea.  We discussed options such as seeing her in the office or checking a CBC, but he feels she is quite ill and prefers to take her to the ER-in this case I certainly support him and encouraged him to take her to the ER right away

## 2023-03-17 NOTE — Telephone Encounter (Signed)
Copied from CRM [id number]. Topic: Clinical - Home Health Verbal Orders >> Mar 17, 2023 11:54 AM Marica Otter wrote: Caller/Agency: [facility] Callback Number: [phone] Secure line to leave message Service Requested: palliative care Frequency: Assessment for palliative care versus hospice. Any new concerns about the patient? N/A

## 2023-03-17 NOTE — Telephone Encounter (Signed)
Copied from CRM [id number]. Topic: General - Call Back - No Documentation >> Mar 17, 2023  8:45 AM Steele Sizer wrote: Reason for CRM: Pt husband Greggory Stallion called and stated that he is needing someone to give him a callback due to his wife condition. He stated that she was admitted to the hospital and has had 3 blood transfusion and one iron.

## 2023-03-18 DIAGNOSIS — E871 Hypo-osmolality and hyponatremia: Secondary | ICD-10-CM | POA: Diagnosis not present

## 2023-03-18 NOTE — Telephone Encounter (Signed)
Okay for new order?

## 2023-03-18 NOTE — Telephone Encounter (Signed)
Called [facility] back-I think right now we are pausing the idea of hospice or palliative care as the patient has been admitted with hyponatremia.  If she is able to get back to her previous baseline she should not need their services.  However, we will certainly see how things go while she is in the hospital.  I gave Revonda Standard my cell phone number

## 2023-03-19 DIAGNOSIS — E871 Hypo-osmolality and hyponatremia: Secondary | ICD-10-CM | POA: Diagnosis not present

## 2023-03-20 DIAGNOSIS — E871 Hypo-osmolality and hyponatremia: Secondary | ICD-10-CM | POA: Diagnosis not present

## 2023-03-21 DIAGNOSIS — E871 Hypo-osmolality and hyponatremia: Secondary | ICD-10-CM | POA: Diagnosis not present

## 2023-03-23 ENCOUNTER — Telehealth: Payer: Self-pay

## 2023-03-23 NOTE — Transitions of Care (Post Inpatient/ED Visit) (Unsigned)
   03/23/2023  Name: Angela Gibbs MRN: [id number] DOB: 20-Feb-1940  Today's TOC FU Call Status: Today's TOC FU Call Status:: Unsuccessful Call (1st Attempt) Unsuccessful Call (1st Attempt) Date: 03/23/23  Attempted to reach the patient regarding the most recent Inpatient/ED visit.  Follow Up Plan: Additional outreach attempts will be made to reach the patient to complete the Transitions of Care (Post Inpatient/ED visit) call.   Signature Karena Addison, LPN [facility] Nurse Health Advisor Direct Dial [phone]

## 2023-03-24 NOTE — Transitions of Care (Post Inpatient/ED Visit) (Signed)
 03/24/2023  Name: Angela Gibbs MRN: [id number] DOB: 04-May-1940  Today's TOC FU Call Status: Today's TOC FU Call Status:: Successful TOC FU Call Completed Unsuccessful Call (1st Attempt) Date: 03/23/23 [facility] FU Call Complete Date: 03/24/23 Patient's Name and Date of Birth confirmed.  Transition Care Management Follow-up Telephone Call Date of Discharge: 03/21/23 Discharge Facility: Other ([facility]) Name of Other ([facility]) [facility]charge Facility: [facility] Type of Discharge: Inpatient Admission Primary Inpatient Discharge Diagnosis:: depression How have you been since you were released from the hospital?: Better Any questions or concerns?: No  Items Reviewed: Did you receive and understand the discharge instructions provided?: Yes Medications obtained,verified, and reconciled?: Yes (Medications Reviewed) Any new allergies since your discharge?: No Dietary orders reviewed?: Yes Do you have support at home?: Yes People in Home: spouse  Medications Reviewed Today: Medications Reviewed Today     Reviewed by Emmitt Pan, LPN (Licensed Practical Nurse) on 03/24/23 at 1551  Med List Status: <None>   Medication Order Taking? Sig Documenting Provider Last Dose Status Informant  ALPRAZolam  (XANAX ) 0.25 MG tablet [id number] No TAKE 1 TABLET(0.25 MG) BY MOUTH TWICE DAILY AS NEEDED FOR ANXIETY  Patient taking differently: Take 0.25 mg by mouth 2 (two) times daily as needed for anxiety.   Copland, [name], MD Not Taking Active Self           Med Note MARISA, NATHANEL SAILOR   Tue Mar 10, 2023  7:16 PM)    Ascorbic Acid (VITAMIN C) 1000 MG tablet [id number] No Take 1,000 mg by mouth daily. [provider] Past Week Active Self           Med Note EFRAIM, ALFREIDA CROME   Fri Mar 06, 2023  4:18 PM)    atorvastatin  (LIPITOR) 10 MG tablet [id number] No Take 1 tablet (10 mg total) by mouth daily.  Patient not taking: Reported on 03/10/2023   RevankarJennifer SAUNDERS, MD Not Taking  Active Self           Med Note MARISA, NATHANEL SAILOR   Tue Mar 10, 2023  7:18 PM)    butalbital-acetaminophen -caffeine (FIORICET) 50-325-40 MG tablet [id number]  Take 1-2 tablets by mouth 2 (two) times daily as needed for headache (or leg pain). [provider]  Active Self  Cholecalciferol (VITAMIN D3) 125 MCG (5000 UT) CAPS [id number] No Take 5,000 Units by mouth daily. [provider] 03/10/2023 Active Self  FLUoxetine  (PROZAC ) 20 MG capsule [id number] No TAKE 1 CAPSULE(20 MG) BY MOUTH DAILY  Patient taking differently: Take 20 mg by mouth daily.   Copland, [name], MD 03/09/2023 Active Self           Med Note MARISA, NATHANEL SAILOR   Tue Mar 10, 2023  7:19 PM)    MAGNESIUM GLYCINATE PO [id number] No Take 50 mg by mouth daily. [provider] 03/09/2023 Active Self  montelukast  (SINGULAIR ) 10 MG tablet [id number] No TAKE 1 TABLET(10 MG) BY MOUTH AT BEDTIME  Patient taking differently: Take 10 mg by mouth at bedtime as needed (for wheezing).   Copland, [name], MD Not Taking Active Self           Med Note MARISA, NATHANEL SAILOR Debar Mar 10, 2023  6:46 PM)    NP THYROID  30 MG tablet [id number] No Take 30 mg by mouth daily before breakfast. [provider] 03/10/2023 Morning Active Self  pantoprazole  (PROTONIX ) 40 MG tablet [id number]  Take 1 tablet (40 mg total) by mouth daily.  Regalado, Belkys A, MD  Active   Prednicarbate 0.1 % CREA [id number] No Apply 1 Application topically as needed for itching (affected sites). [provider] Past Week Active Self           Med Note (CANTER, KAYLYN D   Wed Aug 31, 2019  9:03 AM)    REFRESH TEARS PF 0.5-0.9 % SOLN [id number]  Place 1 drop into both eyes 3 (three) times daily as needed (for irritation). [provider]  Active Self  sodium chloride  (V-R NASAL SPRAY SALINE) 0.65 % nasal spray [id number] No Place 1 spray into the nose as needed. Use in both nostrils twice daily  Patient taking differently: Place 1 spray into the nose  as needed for congestion.   Copland, [name], MD Past Month Active Self  spironolactone  (ALDACTONE ) 25 MG tablet [id number] No TAKE 1/2 TABLET(12.5 MG) BY MOUTH DAILY  Patient taking differently: Take 12.5 mg by mouth daily.   Watt [name], MD 03/10/2023 Morning Active Self           Med Note MARISA, NATHANEL SAILOR   Tue Mar 10, 2023  7:21 PM)    UNKNOWN TO PATIENT [id number] No Take 1 tablet by mouth See admin instructions. Unnamed OTC for restless legs: Take 1 tablet by mouth every other night [provider] Past Week Active Self  valsartan  (DIOVAN ) 320 MG tablet [id number] No TAKE 1 TABLET(320 MG) BY MOUTH DAILY  Patient taking differently: Take 320 mg by mouth in the morning.   Watt [name], MD 03/10/2023 Morning Active Self           Med Note EFRAIM, ALFREIDA CROME   Fri Mar 06, 2023  4:21 PM) LF: 12/25/22 for a 90ds            Home Care and Equipment/Supplies: Were Home Health Services Ordered?: NA Any new equipment or medical supplies ordered?: NA  Functional Questionnaire: Do you need assistance with bathing/showering or dressing?: Yes Do you need assistance with meal preparation?: Yes Do you need assistance with eating?: No Do you need assistance with getting out of bed/getting out of a chair/moving?: No Do you have difficulty managing or taking your medications?: Yes  Follow up appointments reviewed: PCP Follow-up appointment confirmed?: No (declined will call back to schedule) MD Provider Line Number:[phone] Given: No Specialist Hospital Follow-up appointment confirmed?: Yes Date of Specialist follow-up appointment?: 03/23/23 Follow-Up Specialty Provider:: GI Do you need transportation to your follow-up appointment?: No Do you understand care options if your condition(s) worsen?: Yes-patient verbalized understanding    SIGNATURE Julian Lemmings, LPN [facility] Nurse Health Advisor Direct Dial [phone]

## 2023-03-26 ENCOUNTER — Encounter (INDEPENDENT_AMBULATORY_CARE_PROVIDER_SITE_OTHER): Payer: Self-pay | Admitting: Family Medicine

## 2023-03-26 DIAGNOSIS — E875 Hyperkalemia: Secondary | ICD-10-CM

## 2023-03-26 DIAGNOSIS — K922 Gastrointestinal hemorrhage, unspecified: Secondary | ICD-10-CM

## 2023-03-27 ENCOUNTER — Encounter: Payer: Self-pay | Admitting: Family Medicine

## 2023-03-27 ENCOUNTER — Other Ambulatory Visit (INDEPENDENT_AMBULATORY_CARE_PROVIDER_SITE_OTHER): Payer: Medicare Other

## 2023-03-27 DIAGNOSIS — K922 Gastrointestinal hemorrhage, unspecified: Secondary | ICD-10-CM | POA: Diagnosis not present

## 2023-03-27 DIAGNOSIS — E875 Hyperkalemia: Secondary | ICD-10-CM | POA: Diagnosis not present

## 2023-03-27 LAB — BASIC METABOLIC PANEL
BUN: 9 mg/dL (ref 6–23)
CO2: 29 meq/L (ref 19–32)
Calcium: 9.1 mg/dL (ref 8.4–10.5)
Chloride: 98 meq/L (ref 96–112)
Creatinine, Ser: 0.77 mg/dL (ref 0.40–1.20)
GFR: 71.76 mL/min (ref >60.00)
Glucose, Bld: 88 mg/dL (ref 70–99)
Potassium: 4.9 meq/L (ref 3.5–5.1)
Sodium: 133 meq/L — ABNORMAL LOW (ref 135–145)

## 2023-03-27 LAB — CBC
HCT: 33.5 % — ABNORMAL LOW (ref 36.0–46.0)
Hemoglobin: 10.4 g/dL — ABNORMAL LOW (ref 12.0–15.0)
MCHC: 31.1 g/dL (ref 30.0–36.0)
MCV: 80.4 fL (ref 78.0–100.0)
Platelets: 485 10*3/uL — ABNORMAL HIGH (ref 150.0–400.0)
RBC: 4.17 Mil/uL (ref 3.87–5.11)
RDW: 20.2 % — ABNORMAL HIGH (ref 11.5–15.5)
WBC: 5.9 10*3/uL (ref 4.0–10.5)

## 2023-03-27 NOTE — Telephone Encounter (Signed)

## 2023-03-27 NOTE — Telephone Encounter (Signed)
 Received labs as below, message to patient She was admitted from January 28 through February 1 at a [facility] facility with COVID and hyponatremia I reviewed her outside medical records and labs  On January 21, hemoglobin was 9.6, hematocrit 30.4 Her sodium on day of discharge 130, potassium 3.5  As such, blood work from today shows improvement.  Sodium moving towards normal, hemoglobin continues to improve    Chemistry      Component Value Date/Time   NA 133 (L) 03/27/2023 1251   NA 133 (L) 02/16/2023 1341   K 4.9 03/27/2023 1251   CL 98 03/27/2023 1251   CO2 29 03/27/2023 1251   BUN 9 03/27/2023 1251   BUN 15 02/16/2023 1341   CREATININE 0.77 03/27/2023 1251   CREATININE 0.70 05/01/2021 1430   CREATININE 0.76 12/07/2019 1413   GLU 80 11/18/2019 0000      Component Value Date/Time   CALCIUM  9.1 03/27/2023 1251   ALKPHOS 64 03/11/2023 0350   AST 28 03/11/2023 0350   AST 18 05/01/2021 1430   ALT 33 03/11/2023 0350   ALT 16 05/01/2021 1430   BILITOT 1.0 03/11/2023 0350   BILITOT 0.3 01/22/2023 1602   BILITOT 0.4 05/01/2021 1430     Lab Results  Component Value Date   WBC 5.9 03/27/2023   HGB 10.4 (L) 03/27/2023   HCT 33.5 (L) 03/27/2023   MCV 80.4 03/27/2023   PLT 485.0 (H) 03/27/2023

## 2023-03-30 NOTE — Progress Notes (Signed)
 [facility] [address], [address] [place], [place] [address] [phone]  Date:  04/01/2023   Name:  Angela Gibbs   DOB:  [birth year]   MRN:  [id number]  PCP:  Angela Cables, MD    Chief Complaint: Hospitalization Follow-up (03/17/23 [facility]: Covid, Hyponatremia/Concerns/ questions:  pt asks where does she go from here? )   History of Present Illness:  Angela Gibbs is a 83 y.o. very pleasant female patient who presents with the following:  Pt seen today for follow-up- history of TIA, restrictive lung disease due to scoliosis and hiatal hernia, psoriatic arthritis, hypertension, iron deficiency anemia, hypothyroidism   Unfortunately she has had an eventful few weeks  I saw her in October for long term SOB  She noted sx concerning for GI bleed with dark stools on 1/12- labs revealed evidence of a GI bleed with Hg 6.8, I directed her to the ER- she was admitted 1/17 but left AM the next day After some communications and continued symptoms she returned to the hospital a few days later Admit date:     03/10/2023  Discharge date: 03/12/23  Discharge Physician: Jon Billings A Regalado    Recommendations at discharge:   Needs IV iron infusion out  patient.  Needs follow up with GI, for consideration of Colonoscopy  Discharge Diagnoses: Principal Problem:   Upper GI bleed Active Problems:   Essential hypertension   Gastroesophageal reflux disease without esophagitis   IDA (iron deficiency anemia)   Obesity (BMI 30.0-34.9)   Mixed dyslipidemia   Hyponatremia   Resolved Problems:   * No resolved hospital problems. *   Hospital Course: 83 year old with past medical history significant for iron deficiency anemia, hypertension, large hiatal hernia, TIA, hyponatremia, restrictive lung disease, recently presented to the hospital with anemia, fatigue, left AMA presents back for further evaluation. Patient presented back for  further evaluation of melena and anemia. Assessment and Plan: 1-Melena, anemia, acute blood loss: Iron deficiency anemia, unclear source of bleeding.  -He reports an episode of black stool 2 weeks ago -Received  1 unit of packed red blood cells on admission.  Hemoglobin increased to 8 -Continue IV Protonix -Underwent endoscopy showed hiatal hernia, mild gastritis.  Discharge on Protonix daily, needs out patient evaluation for anemia, might need colonoscopy.  -Received  IV iron, Hb stable at 8.7 2-Hypertension Resume Diovan  spironolactone Hold nebivolol due to bradycardia.  GERD: Continue with PPI Obesity type II Hyponatremia, chronic holding hydrochlorothiazide Hyperlipidemia: Resume Lipitor Hypothyroidism: Continue with Armour Obesity type 2. Needs life style modification.    She had an upper  GI scope on 1/23 which was non- acute  Unfortunately she then got covid - contacted me on 1/28: Called and spoke with husband.  He is concerned that [name] is not doing okay, she seems very weak and also has COVID.  He is concerned that her blood counts may have dropped again, notes he is having diarrhea.  We discussed options such as seeing her in the office or checking a CBC, but he feels she is quite ill and prefers to take her to the ER-in this case I certainly support him and encouraged him to take her to the ER right away  She was also taking ivermectin which was stopped by the inpt team at [facility]- she was treated with paxlovid  Admit date: 03/17/2023 Discharge date: 03/21/2023  Discharge Diagnoses:  Principal Problem: Hyponatremia Active Problems: Bilateral  leg edema Essential hypertension Dyslipidemia Anxiety and depression History of GI bleed Normocytic anemia Leukopenia Pulmonary infiltrate Adult hypothyroidism COVID-19 virus infection Sinus bradycardia  CT Chest WO Contrast  dated 03/17/23: Final Result  IMPRESSION: 1. Bilateral groundglass opacities and small lung  nodules, likely reflecting pneumonia. There is also mediastinal and bilateral hilar lymphadenopathy. Recommend short-term follow-up chest CT in 3 months to ensure improvement.  2. Large hiatal hernia   We went over this CT report and I ordered a follow-up CT to be done later this year 03/20/23:  hg 9.6, hct 30.4 Na 130 K 3.5  Wt Readings from Last 3 Encounters:  04/01/23 186 lb 12.8 oz (84.7 kg)  03/10/23 187 lb 6.3 oz (85 kg)  03/07/23 189 lb 2.5 oz (85.8 kg)     Patient Active Problem List   Diagnosis Date Noted   Hyponatremia 03/10/2023   Upper GI bleed 03/06/2023   Mixed dyslipidemia 01/28/2023   Aortic atherosclerosis (HCC) 01/21/2023   DOE (dyspnea on exertion) 12/25/2022   Eczema 12/24/2022   Fatigue 12/24/2022   Inflammatory polyarthropathy (HCC) 12/24/2022   Joint pain 12/24/2022   Lymphedema 12/24/2022   Sjogren syndrome, unspecified (HCC) 12/24/2022   Osteopenia 08/12/2022   Dyspnea on exertion 12/25/2021   Obesity (BMI 30.0-34.9) 12/25/2021   Allergy 12/13/2021   Concussion 12/13/2021   Falls 12/13/2021   Head injury 12/13/2021   Herpes 12/13/2021   Migraines 12/13/2021   Nerve damage 12/13/2021   Shingles 12/13/2021   Body mass index (BMI) 31.0-31.9, adult 12/13/2021   IDA (iron deficiency anemia) 01/11/2020   Positive FIT (fecal immunochemical test) 12/28/2019   Iron deficiency anemia due to chronic blood loss 12/28/2019   Psoriatic arthritis (HCC) 12/07/2019   Psoriasis 12/07/2019   Osteoarthritis 12/07/2019   TIA (transient ischemic attack) 05/18/2019   Restrictive lung disease 05/13/2016   Moderate persistent asthma without complication 05/13/2016   Current use of beta blocker 04/11/2016   Other allergic rhinitis 04/11/2016   Gastroesophageal reflux disease without esophagitis 04/11/2016   Essential hypertension 02/20/2016   Chalazion of right upper eyelid 06/06/2014   Nuclear sclerosis of both eyes 06/06/2014   PVD (posterior vitreous  detachment), both eyes 03/19/2014   Retinal tear of right eye 02/13/2014   Abdominal pain, acute, right upper quadrant 07/29/2013   Cataracts, bilateral 06/06/2013   Eyelid lesion 06/06/2013    Past Medical History:  Diagnosis Date   Abdominal pain, acute, right upper quadrant 07/29/2013   Allergy    Aortic atherosclerosis (HCC) 01/21/2023   Body mass index (BMI) 31.0-31.9, adult 12/13/2021   Cataracts, bilateral 06/06/2013   Chalazion of right upper eyelid 06/06/2014   Concussion    Current use of beta blocker 04/11/2016   DOE (dyspnea on exertion) 12/25/2022   Dyspnea on exertion 12/25/2021   Eczema 12/24/2022   Essential hypertension 02/20/2016   Eyelid lesion 06/06/2013   Falls    Fatigue 12/24/2022   Gastroesophageal reflux disease without esophagitis 04/11/2016   Head injury    Herpes    IDA (iron deficiency anemia) 01/11/2020   Inflammatory polyarthropathy (HCC) 12/24/2022   Iron deficiency anemia due to chronic blood loss 12/28/2019   Joint pain 12/24/2022   Lymphedema 12/24/2022   Migraines    Moderate persistent asthma without complication 05/13/2016   Nerve damage    Nuclear sclerosis of both eyes 06/06/2014   Obesity (BMI 30.0-34.9) 12/25/2021   Osteoarthritis 12/07/2019   Osteopenia 08/12/2022   Other allergic rhinitis 04/11/2016   Positive FIT (  fecal immunochemical test) 12/28/2019   Psoriasis 12/07/2019   Psoriatic arthritis (HCC) 12/07/2019   Managed by rheumatology   PVD (posterior vitreous detachment), both eyes 03/19/2014   Restrictive lung disease 05/13/2016   Related to scoliosis and hiatal hernia   Retinal tear of right eye 02/13/2014   Shingles    Sjogren syndrome, unspecified (HCC) 12/24/2022   TIA (transient ischemic attack) 05/18/2019    Past Surgical History:  Procedure Laterality Date   BIOPSY  03/12/2023   Procedure: BIOPSY;  Surgeon: Kathi Der, MD;  Location: [facility];  Service: Gastroenterology;;   BRAIN SURGERY      CRANIOTOMY FOR EXTRACRANIAL-INTRACRANIAL BYPASS     ESOPHAGOGASTRODUODENOSCOPY (EGD) WITH PROPOFOL N/A 03/12/2023   Procedure: ESOPHAGOGASTRODUODENOSCOPY (EGD) WITH PROPOFOL;  Surgeon: Kathi Der, MD;  Location: [facility];  Service: Gastroenterology;  Laterality: N/A;   EYE SURGERY  4.4.17   milia excision   HIP SURGERY     intracranial surgery   NERVE SURGERY     ROOT CANAL     TONSILLECTOMY      Social History   Tobacco Use   Smoking status: Never   Smokeless tobacco: Never  Vaping Use   Vaping status: Never Used  Substance Use Topics   Alcohol use: Yes    Comment: social   Drug use: No    Family History  Problem Relation Age of Onset   Heart disease Father    Heart attack Father     Allergies  Allergen Reactions   Cephalosporins Shortness Of Breath and Other (See Comments)    Breathing difficulty   Ciprofloxacin Hives and Shortness Of Breath   Influenza Virus Vaccine Other (See Comments)    Numbness in legs   Keflex [Cephalexin] Hives   Latex Hives, Itching and Rash   Levaquin [Levofloxacin] Hives, Palpitations and Other (See Comments)    Chest pain   Nitrofuran Derivatives Hives, Itching and Swelling   Penicillins Hives   Sulfacetamide Sodium Hives   Tape Itching and Other (See Comments)    RED AND IRRITATED SKIN   Xopenex [Levalbuterol Hcl] Anxiety and Other (See Comments)    Tremors with violent body chattering and shaking   Zostavax [Zoster Vaccine Live] Other (See Comments)    Pt has been told by specialists not to receive vaccine d/t h/o herpes in bloodstream and shingles.   Erythromycin Other (See Comments)    "stomach issues"   Hydrochlorothiazide Other (See Comments)    Migraine   Lisinopril Cough   Petrolatum Nausea Only   Pregabalin Other (See Comments)   Ropinirole Hcl Nausea And Vomiting   Shellfish Allergy Hives   Sulfa Antibiotics Hives   Golimumab Other (See Comments)    "Felt poorly" (Simponi)   Ibuprofen Other (See Comments)     Stomach upset   Lactose Other (See Comments)    "stomach issues"  Other Reaction(s): GI Intolerance   Aspirin Other (See Comments)    Stomach pain/burning   Avelox [Moxifloxacin Hcl In Nacl] Nausea Only   Cefdinir Nausea And Vomiting   Chromium Other (See Comments)    Blisters   Egg Solids, Whole Other (See Comments)    Per allergy testing, no known reaction. Pt states she was told she could probably eat 2-3 eggs weekly w/o issue.   Egg-Derived Products     Per allergy testing, no known reaction. Pt states she was told she could probably eat 2-3 eggs weekly w/o issue.   Iodinated Contrast Media Other (See Comments)  unknown    Lactose Intolerance (Gi) Other (See Comments)    "stomach issues"   Percodan [Oxycodone-Aspirin] Nausea And Vomiting   Prednisone Palpitations    No injections, can do the pills   Troleandomycin Other (See Comments)    Unknown reaction    Medication list has been reviewed and updated.  Current Outpatient Medications on File Prior to Visit  Medication Sig Dispense Refill   ALPRAZolam (XANAX) 0.25 MG tablet TAKE 1 TABLET(0.25 MG) BY MOUTH TWICE DAILY AS NEEDED FOR ANXIETY (Patient taking differently: Take 0.25 mg by mouth 2 (two) times daily as needed for anxiety.) 30 tablet 0   Ascorbic Acid (VITAMIN C) 1000 MG tablet Take 1,000 mg by mouth daily.     atorvastatin (LIPITOR) 10 MG tablet Take 1 tablet (10 mg total) by mouth daily. 90 tablet 3   butalbital-acetaminophen-caffeine (FIORICET) 50-325-40 MG tablet Take 1-2 tablets by mouth 2 (two) times daily as needed for headache (or leg pain).     Cholecalciferol (VITAMIN D3) 125 MCG (5000 UT) CAPS Take 5,000 Units by mouth daily.     FLUoxetine (PROZAC) 20 MG capsule TAKE 1 CAPSULE(20 MG) BY MOUTH DAILY (Patient taking differently: Take 20 mg by mouth daily.) 90 capsule 1   MAGNESIUM GLYCINATE PO Take 50 mg by mouth daily.     montelukast (SINGULAIR) 10 MG tablet TAKE 1 TABLET(10 MG) BY MOUTH AT BEDTIME  (Patient taking differently: Take 10 mg by mouth at bedtime as needed (for wheezing).) 30 tablet 2   NP THYROID 30 MG tablet Take 30 mg by mouth daily before breakfast.     pantoprazole (PROTONIX) 40 MG tablet Take 1 tablet (40 mg total) by mouth daily. 30 tablet 1   Prednicarbate 0.1 % CREA Apply 1 Application topically as needed for itching (affected sites).     REFRESH TEARS PF 0.5-0.9 % SOLN Place 1 drop into both eyes 3 (three) times daily as needed (for irritation).     sodium chloride (V-R NASAL SPRAY SALINE) 0.65 % nasal spray Place 1 spray into the nose as needed. Use in both nostrils twice daily (Patient taking differently: Place 1 spray into the nose as needed for congestion.) 30 mL 6   spironolactone (ALDACTONE) 25 MG tablet TAKE 1/2 TABLET(12.5 MG) BY MOUTH DAILY (Patient taking differently: Take 12.5 mg by mouth daily.) 30 tablet 3   UNKNOWN TO PATIENT Take 1 tablet by mouth See admin instructions. Unnamed OTC for restless legs: Take 1 tablet by mouth every other night     valsartan (DIOVAN) 320 MG tablet TAKE 1 TABLET(320 MG) BY MOUTH DAILY (Patient taking differently: Take 320 mg by mouth in the morning.) 90 tablet 3   No current facility-administered medications on file prior to visit.    Review of Systems:  As per HPI- otherwise negative.   Physical Examination: Vitals:   04/01/23 1423  BP: 124/80  Pulse: (!) 52  Resp: 18  Temp: 97.8 F (36.6 C)  SpO2: 98%   Vitals:   04/01/23 1423  Weight: 186 lb 12.8 oz (84.7 kg)  Height: 5' 2.6" (1.59 m)   Body mass index is 33.51 kg/m. Ideal Body Weight: Weight in (lb) to have BMI = 25: 139.1  GEN: no acute distress.  Mildly obese, looks well.  Good color, conjunctiva quite pink HEENT: Atraumatic, Normocephalic.  Ears and Nose: No external deformity. CV: RRR, No M/G/R. No JVD. No thrill. No extra heart sounds. PULM: CTA B, no wheezes, crackles, rhonchi. No retractions.  No resp. distress. No accessory muscle use. ABD: S,  NT, ND EXTR: No c/c/e PSYCH: Normally interactive. Conversant.    Assessment and Plan: Gastrointestinal hemorrhage, unspecified gastrointestinal hemorrhage type - Plan: CBC  Hyponatremia - Plan: Basic metabolic panel  Abnormal chest CT - Plan: CT Chest Wo Contrast  Patient seen today for follow-up.  Above, she was recently admitted with a presumed upper GI bleed and then admitted again with COVID-19.  She is feeling quite a better although not 100%.  Will follow-up on her CBC and sodium today.  Ordered a follow-up CT of her chest to be done in May of this year.  I have reached out to gastroenterology and also hematology regarding further follow-up.  Will let patient know what I hear back  She is continuing to take PPI-pantoprazole 1 daily.  I encouraged her to take this for at least a couple of months.  She will watch for any evidence of recurrent GI bleed  Signed [name], MD  Addnd 2/13- received labs, message to pt   03/20/23:  hg 9.6, hct 30.4 Na 130 K 3.5   Results for orders placed or performed in visit on 04/01/23  Basic metabolic panel   Collection Time: 04/01/23  2:55 PM  Result Value Ref Range   Sodium 131 (L) 135 - 145 mEq/L   Potassium 4.8 3.5 - 5.1 mEq/L   Chloride 96 96 - 112 mEq/L   CO2 28 19 - 32 mEq/L   Glucose, Bld 84 70 - 99 mg/dL   BUN 14 6 - 23 mg/dL   Creatinine, Ser 1.61 0.40 - 1.20 mg/dL   GFR 09.60 >45.40 mL/min   Calcium 8.9 8.4 - 10.5 mg/dL  CBC   Collection Time: 04/01/23  2:55 PM  Result Value Ref Range   WBC 6.8 4.0 - 10.5 K/uL   RBC 3.94 3.87 - 5.11 Mil/uL   Platelets 358.0 150.0 - 400.0 K/uL   Hemoglobin 10.0 (L) 12.0 - 15.0 g/dL   HCT 98.1 (L) 19.1 - 47.8 %   MCV 78.7 78.0 - 100.0 fl   MCHC 32.2 30.0 - 36.0 g/dL   RDW 29.5 (H) 62.1 - 30.8 %

## 2023-04-01 ENCOUNTER — Ambulatory Visit (INDEPENDENT_AMBULATORY_CARE_PROVIDER_SITE_OTHER): Payer: Medicare Other | Admitting: Family Medicine

## 2023-04-01 VITALS — BP 124/80 | HR 52 | Temp 97.8°F | Resp 18 | Ht 62.6 in | Wt 186.8 lb

## 2023-04-01 DIAGNOSIS — R9389 Abnormal findings on diagnostic imaging of other specified body structures: Secondary | ICD-10-CM

## 2023-04-01 DIAGNOSIS — K922 Gastrointestinal hemorrhage, unspecified: Secondary | ICD-10-CM

## 2023-04-01 DIAGNOSIS — E871 Hypo-osmolality and hyponatremia: Secondary | ICD-10-CM

## 2023-04-01 NOTE — Patient Instructions (Addendum)
Good to see you doing better today!  I will get in touch with Dr Myna Hidalgo and with the Gastroenterologist you saw in the hospital Dr Darcus Austin A. Brahmbhatt, MD with [facility] to arrange next steps I ordered a repeat chest CT which should be done in about 3 months

## 2023-04-02 ENCOUNTER — Encounter: Payer: Self-pay | Admitting: Family Medicine

## 2023-04-02 LAB — CBC
HCT: 31 % — ABNORMAL LOW (ref 36.0–46.0)
Hemoglobin: 10 g/dL — ABNORMAL LOW (ref 12.0–15.0)
MCHC: 32.2 g/dL (ref 30.0–36.0)
MCV: 78.7 fL (ref 78.0–100.0)
Platelets: 358 10*3/uL (ref 150.0–400.0)
RBC: 3.94 Mil/uL (ref 3.87–5.11)
RDW: 20.1 % — ABNORMAL HIGH (ref 11.5–15.5)
WBC: 6.8 10*3/uL (ref 4.0–10.5)

## 2023-04-02 LAB — BASIC METABOLIC PANEL
BUN: 14 mg/dL (ref 6–23)
CO2: 28 meq/L (ref 19–32)
Calcium: 8.9 mg/dL (ref 8.4–10.5)
Chloride: 96 meq/L (ref 96–112)
Creatinine, Ser: 0.65 mg/dL (ref 0.40–1.20)
GFR: 81.9 mL/min (ref >60.00)
Glucose, Bld: 84 mg/dL (ref 70–99)
Potassium: 4.8 meq/L (ref 3.5–5.1)
Sodium: 131 meq/L — ABNORMAL LOW (ref 135–145)

## 2023-04-07 ENCOUNTER — Ambulatory Visit (HOSPITAL_BASED_OUTPATIENT_CLINIC_OR_DEPARTMENT_OTHER): Payer: Medicare Other

## 2023-04-08 ENCOUNTER — Encounter: Payer: Self-pay | Admitting: Family Medicine

## 2023-04-13 ENCOUNTER — Other Ambulatory Visit: Payer: Self-pay | Admitting: Family

## 2023-04-13 DIAGNOSIS — D509 Iron deficiency anemia, unspecified: Secondary | ICD-10-CM

## 2023-04-13 DIAGNOSIS — D500 Iron deficiency anemia secondary to blood loss (chronic): Secondary | ICD-10-CM

## 2023-04-14 ENCOUNTER — Encounter: Payer: Self-pay | Admitting: Family

## 2023-04-14 ENCOUNTER — Inpatient Hospital Stay: Payer: Medicare Other | Attending: Hematology & Oncology

## 2023-04-14 ENCOUNTER — Inpatient Hospital Stay (HOSPITAL_BASED_OUTPATIENT_CLINIC_OR_DEPARTMENT_OTHER): Payer: Medicare Other | Admitting: Family

## 2023-04-14 VITALS — BP 124/61 | HR 57 | Temp 97.8°F | Resp 19 | Ht 62.0 in | Wt 184.1 lb

## 2023-04-14 DIAGNOSIS — K449 Diaphragmatic hernia without obstruction or gangrene: Secondary | ICD-10-CM | POA: Diagnosis not present

## 2023-04-14 DIAGNOSIS — D500 Iron deficiency anemia secondary to blood loss (chronic): Secondary | ICD-10-CM

## 2023-04-14 DIAGNOSIS — R5383 Other fatigue: Secondary | ICD-10-CM | POA: Insufficient documentation

## 2023-04-14 DIAGNOSIS — Z880 Allergy status to penicillin: Secondary | ICD-10-CM | POA: Diagnosis not present

## 2023-04-14 DIAGNOSIS — Z79899 Other long term (current) drug therapy: Secondary | ICD-10-CM | POA: Insufficient documentation

## 2023-04-14 DIAGNOSIS — Z885 Allergy status to narcotic agent status: Secondary | ICD-10-CM | POA: Diagnosis not present

## 2023-04-14 DIAGNOSIS — Z881 Allergy status to other antibiotic agents status: Secondary | ICD-10-CM | POA: Insufficient documentation

## 2023-04-14 DIAGNOSIS — D509 Iron deficiency anemia, unspecified: Secondary | ICD-10-CM | POA: Insufficient documentation

## 2023-04-14 DIAGNOSIS — Z887 Allergy status to serum and vaccine status: Secondary | ICD-10-CM | POA: Insufficient documentation

## 2023-04-14 DIAGNOSIS — Z888 Allergy status to other drugs, medicaments and biological substances status: Secondary | ICD-10-CM | POA: Insufficient documentation

## 2023-04-14 DIAGNOSIS — Z886 Allergy status to analgesic agent status: Secondary | ICD-10-CM | POA: Diagnosis not present

## 2023-04-14 DIAGNOSIS — R0602 Shortness of breath: Secondary | ICD-10-CM | POA: Diagnosis not present

## 2023-04-14 DIAGNOSIS — Z882 Allergy status to sulfonamides status: Secondary | ICD-10-CM | POA: Diagnosis not present

## 2023-04-14 LAB — CBC WITH DIFFERENTIAL (CANCER CENTER ONLY)
Abs Immature Granulocytes: 0.04 10*3/uL (ref 0.00–0.07)
Basophils Absolute: 0.1 10*3/uL (ref 0.0–0.1)
Basophils Relative: 1 %
Eosinophils Absolute: 0.2 10*3/uL (ref 0.0–0.5)
Eosinophils Relative: 4 %
HCT: 28 % — ABNORMAL LOW (ref 36.0–46.0)
Hemoglobin: 8.7 g/dL — ABNORMAL LOW (ref 12.0–15.0)
Immature Granulocytes: 1 %
Lymphocytes Relative: 18 %
Lymphs Abs: 1 10*3/uL (ref 0.7–4.0)
MCH: 24.3 pg — ABNORMAL LOW (ref 26.0–34.0)
MCHC: 31.1 g/dL (ref 30.0–36.0)
MCV: 78.2 fL — ABNORMAL LOW (ref 80.0–100.0)
Monocytes Absolute: 1 10*3/uL (ref 0.1–1.0)
Monocytes Relative: 19 %
Neutro Abs: 3.3 10*3/uL (ref 1.7–7.7)
Neutrophils Relative %: 57 %
Platelet Count: 272 10*3/uL (ref 150–400)
RBC: 3.58 MIL/uL — ABNORMAL LOW (ref 3.87–5.11)
RDW: 18.1 % — ABNORMAL HIGH (ref 11.5–15.5)
WBC Count: 5.6 10*3/uL (ref 4.0–10.5)
nRBC: 0 % (ref 0.0–0.2)

## 2023-04-14 LAB — RETICULOCYTES
Immature Retic Fract: 13.3 % (ref 2.3–15.9)
RBC.: 3.58 MIL/uL — ABNORMAL LOW (ref 3.87–5.11)
Retic Count, Absolute: 40.5 10*3/uL (ref 19.0–186.0)
Retic Ct Pct: 1.1 % (ref 0.4–3.1)

## 2023-04-14 LAB — IRON AND IRON BINDING CAPACITY (CC-WL,HP ONLY)
Iron: 14 ug/dL — ABNORMAL LOW (ref 28–170)
Saturation Ratios: 3 % — ABNORMAL LOW (ref 10.4–31.8)
TIBC: 549 ug/dL — ABNORMAL HIGH (ref 250–450)
UIBC: 535 ug/dL — ABNORMAL HIGH (ref 148–442)

## 2023-04-14 LAB — FERRITIN: Ferritin: 9 ng/mL — ABNORMAL LOW (ref 11–307)

## 2023-04-14 NOTE — Progress Notes (Signed)
 Hematology and Oncology Follow Up Visit  Angela Gibbs [id number] March 30, 1940 83 y.o. 04/14/2023   Principle Diagnosis:  Iron deficiency anemia   Current Therapy:        IV iron as indicated    Interim History:  Angela Gibbs is here today for follow-up. She was in the hospital in January due to anemia secondary to GI bleed. She received a unit of blood and 1 dose of IV iron during admission. EGD showed a hiatal hernia as well as mild gastritis.  Hgb today is 8.7, MCV 78, platelets 272 and WBC count 5.6.  She is symptomatic with fatigue and mild SOB with exertion.  She has not noted any more dark tarry stools or bright red blood loss.  She has not discussed colonoscopy with GI yet.  No fever, chills, n/v, cough, rash, dizziness, chest pain, palpitations, abdominal pain or changes in bladder habits.  No swelling in her extremities.  She has had numbness in the fingers of her left hands since discharge and states that she has an MRI scheduled with ortho to better assess for cause.  No falls or syncope reported.  Appetite and hydration are good. Weight is 184 lbs.   ECOG Performance Status: 1 - Symptomatic but completely ambulatory  Medications:  Allergies as of 04/14/2023       Reactions   Cephalosporins Shortness Of Breath, Other (See Comments)   Breathing difficulty   Ciprofloxacin Hives, Shortness Of Breath   Influenza Virus Vaccine Other (See Comments)   Numbness in legs   Keflex [cephalexin] Hives   Latex Hives, Itching, Rash   Levaquin [levofloxacin] Hives, Palpitations, Other (See Comments)   Chest pain   Nitrofuran Derivatives Hives, Itching, Swelling   Penicillins Hives   Sulfacetamide Sodium Hives   Tape Itching, Other (See Comments)   RED AND IRRITATED SKIN   Xopenex [levalbuterol Hcl] Anxiety, Other (See Comments)   Tremors with violent body chattering and shaking   Zostavax [zoster Vaccine Live] Other (See Comments)   Pt has been told by specialists not to  receive vaccine d/t h/o herpes in bloodstream and shingles.   Erythromycin Other (See Comments)   "stomach issues"   Hydrochlorothiazide Other (See Comments)   Migraine   Lisinopril Cough   Petrolatum Nausea Only   Pregabalin Other (See Comments)   Ropinirole Hcl Nausea And Vomiting   Shellfish Allergy Hives   Sulfa Antibiotics Hives   Golimumab Other (See Comments)   "Felt poorly" (Simponi)   Ibuprofen Other (See Comments)   Stomach upset   Lactose Other (See Comments)   "stomach issues" Other Reaction(s): GI Intolerance   Aspirin Other (See Comments)   Stomach pain/burning   Avelox [moxifloxacin Hcl In Nacl] Nausea Only   Cefdinir Nausea And Vomiting   Chromium Other (See Comments)   Blisters   Egg Solids, Whole Other (See Comments)   Per allergy testing, no known reaction. Pt states she was told she could probably eat 2-3 eggs weekly w/o issue.   Egg-derived Products    Per allergy testing, no known reaction. Pt states she was told she could probably eat 2-3 eggs weekly w/o issue.   Iodinated Contrast Media Other (See Comments)   unknown   Lactose Intolerance (gi) Other (See Comments)   "stomach issues"   Percodan [oxycodone-aspirin] Nausea And Vomiting   Prednisone Palpitations   No injections, can do the pills   Troleandomycin Other (See Comments)   Unknown reaction  Medication List        Accurate as of April 14, 2023  1:03 PM. If you have any questions, ask your nurse or doctor.          ALPRAZolam 0.25 MG tablet Commonly known as: XANAX TAKE 1 TABLET(0.25 MG) BY MOUTH TWICE DAILY AS NEEDED FOR ANXIETY What changed: See the new instructions.   atorvastatin 10 MG tablet Commonly known as: LIPITOR Take 1 tablet (10 mg total) by mouth daily.   butalbital-acetaminophen-caffeine 50-325-40 MG tablet Commonly known as: FIORICET Take 1-2 tablets by mouth 2 (two) times daily as needed for headache (or leg pain).   FLUoxetine 20 MG  capsule Commonly known as: PROZAC TAKE 1 CAPSULE(20 MG) BY MOUTH DAILY What changed: See the new instructions.   MAGNESIUM GLYCINATE PO Take 50 mg by mouth daily.   montelukast 10 MG tablet Commonly known as: SINGULAIR TAKE 1 TABLET(10 MG) BY MOUTH AT BEDTIME What changed: See the new instructions.   NP Thyroid 30 MG tablet Generic drug: thyroid Take 30 mg by mouth daily before breakfast.   pantoprazole 40 MG tablet Commonly known as: Protonix Take 1 tablet (40 mg total) by mouth daily.   Prednicarbate 0.1 % Crea Apply 1 Application topically as needed for itching (affected sites).   Refresh Tears PF 0.5-0.9 % Soln Generic drug: Carboxymethylcell-Glycerin PF Place 1 drop into both eyes 3 (three) times daily as needed (for irritation).   sodium chloride 0.65 % nasal spray Commonly known as: V-R NASAL SPRAY SALINE Place 1 spray into the nose as needed. Use in both nostrils twice daily What changed:  reasons to take this additional instructions   spironolactone 25 MG tablet Commonly known as: ALDACTONE TAKE 1/2 TABLET(12.5 MG) BY MOUTH DAILY What changed: See the new instructions.   UNKNOWN TO PATIENT Take 1 tablet by mouth See admin instructions. Unnamed OTC for restless legs: Take 1 tablet by mouth every other night   valsartan 320 MG tablet Commonly known as: DIOVAN TAKE 1 TABLET(320 MG) BY MOUTH DAILY What changed: See the new instructions.   vitamin C 1000 MG tablet Take 1,000 mg by mouth daily.   Vitamin D3 125 MCG (5000 UT) Caps Take 5,000 Units by mouth daily.        Allergies:  Allergies  Allergen Reactions   Cephalosporins Shortness Of Breath and Other (See Comments)    Breathing difficulty   Ciprofloxacin Hives and Shortness Of Breath   Influenza Virus Vaccine Other (See Comments)    Numbness in legs   Keflex [Cephalexin] Hives   Latex Hives, Itching and Rash   Levaquin [Levofloxacin] Hives, Palpitations and Other (See Comments)    Chest  pain   Nitrofuran Derivatives Hives, Itching and Swelling   Penicillins Hives   Sulfacetamide Sodium Hives   Tape Itching and Other (See Comments)    RED AND IRRITATED SKIN   Xopenex [Levalbuterol Hcl] Anxiety and Other (See Comments)    Tremors with violent body chattering and shaking   Zostavax [Zoster Vaccine Live] Other (See Comments)    Pt has been told by specialists not to receive vaccine d/t h/o herpes in bloodstream and shingles.   Erythromycin Other (See Comments)    "stomach issues"   Hydrochlorothiazide Other (See Comments)    Migraine   Lisinopril Cough   Petrolatum Nausea Only   Pregabalin Other (See Comments)   Ropinirole Hcl Nausea And Vomiting   Shellfish Allergy Hives   Sulfa Antibiotics Hives   Golimumab Other (  See Comments)    "Felt poorly" (Simponi)   Ibuprofen Other (See Comments)    Stomach upset   Lactose Other (See Comments)    "stomach issues"  Other Reaction(s): GI Intolerance   Aspirin Other (See Comments)    Stomach pain/burning   Avelox [Moxifloxacin Hcl In Nacl] Nausea Only   Cefdinir Nausea And Vomiting   Chromium Other (See Comments)    Blisters   Egg Solids, Whole Other (See Comments)    Per allergy testing, no known reaction. Pt states she was told she could probably eat 2-3 eggs weekly w/o issue.   Egg-Derived Products     Per allergy testing, no known reaction. Pt states she was told she could probably eat 2-3 eggs weekly w/o issue.   Iodinated Contrast Media Other (See Comments)    unknown    Lactose Intolerance (Gi) Other (See Comments)    "stomach issues"   Percodan [Oxycodone-Aspirin] Nausea And Vomiting   Prednisone Palpitations    No injections, can do the pills   Troleandomycin Other (See Comments)    Unknown reaction    Past Medical History, Surgical history, Social history, and Family History were reviewed and updated.  Review of Systems: All other 10 point review of systems is negative.   Physical Exam:  vitals  were not taken for this visit.   Wt Readings from Last 3 Encounters:  04/01/23 186 lb 12.8 oz (84.7 kg)  03/10/23 187 lb 6.3 oz (85 kg)  03/07/23 189 lb 2.5 oz (85.8 kg)    Ocular: Sclerae unicteric, pupils equal, round and reactive to light Ear-nose-throat: Oropharynx clear, dentition fair Lymphatic: No cervical or supraclavicular adenopathy Lungs no rales or rhonchi, good excursion bilaterally Heart regular rate and rhythm, no murmur appreciated Abd soft, nontender, positive bowel sounds MSK no focal spinal tenderness, no joint edema Neuro: non-focal, well-oriented, appropriate affect Breasts: Deferred   Lab Results  Component Value Date   WBC 6.8 04/01/2023   HGB 10.0 (L) 04/01/2023   HCT 31.0 (L) 04/01/2023   MCV 78.7 04/01/2023   PLT 358.0 04/01/2023   Lab Results  Component Value Date   FERRITIN 6 (L) 03/06/2023   IRON 21 (L) 03/06/2023   TIBC 582 (H) 03/06/2023   UIBC 561 03/06/2023   IRONPCTSAT 4 (L) 03/06/2023   Lab Results  Component Value Date   RETICCTPCT 2.4 03/06/2023   RBC 3.94 04/01/2023   No results found for: "KPAFRELGTCHN", "LAMBDASER", "KAPLAMBRATIO" No results found for: "IGGSERUM", "IGA", "IGMSERUM" No results found for: "TOTALPROTELP", "ALBUMINELP", "A1GS", "A2GS", "BETS", "BETA2SER", "GAMS", "MSPIKE", "SPEI"   Chemistry      Component Value Date/Time   NA 131 (L) 04/01/2023 1455   NA 133 (L) 02/16/2023 1341   K 4.8 04/01/2023 1455   CL 96 04/01/2023 1455   CO2 28 04/01/2023 1455   BUN 14 04/01/2023 1455   BUN 15 02/16/2023 1341   CREATININE 0.65 04/01/2023 1455   CREATININE 0.70 05/01/2021 1430   CREATININE 0.76 12/07/2019 1413   GLU 80 11/18/2019 0000      Component Value Date/Time   CALCIUM 8.9 04/01/2023 1455   ALKPHOS 64 03/11/2023 0350   AST 28 03/11/2023 0350   AST 18 05/01/2021 1430   ALT 33 03/11/2023 0350   ALT 16 05/01/2021 1430   BILITOT 1.0 03/11/2023 0350   BILITOT 0.3 01/22/2023 1602   BILITOT 0.4 05/01/2021 1430        Impression and Plan: Angela Gibbs is a very pleasant 83  yo caucasian female with history of iron deficiency anemia.  We will get her set up for two doses of IV iron.  Follow-up in 4 weeks.    Eileen Stanford, NP 2/25/20251:03 PM

## 2023-04-17 ENCOUNTER — Inpatient Hospital Stay: Payer: Medicare Other

## 2023-04-17 VITALS — BP 151/43 | HR 53 | Temp 97.8°F | Resp 18

## 2023-04-17 DIAGNOSIS — D509 Iron deficiency anemia, unspecified: Secondary | ICD-10-CM | POA: Diagnosis not present

## 2023-04-17 DIAGNOSIS — R0602 Shortness of breath: Secondary | ICD-10-CM | POA: Diagnosis not present

## 2023-04-17 DIAGNOSIS — Z79899 Other long term (current) drug therapy: Secondary | ICD-10-CM | POA: Diagnosis not present

## 2023-04-17 DIAGNOSIS — K449 Diaphragmatic hernia without obstruction or gangrene: Secondary | ICD-10-CM | POA: Diagnosis not present

## 2023-04-17 DIAGNOSIS — Z880 Allergy status to penicillin: Secondary | ICD-10-CM | POA: Diagnosis not present

## 2023-04-17 DIAGNOSIS — D500 Iron deficiency anemia secondary to blood loss (chronic): Secondary | ICD-10-CM

## 2023-04-17 DIAGNOSIS — R5383 Other fatigue: Secondary | ICD-10-CM | POA: Diagnosis not present

## 2023-04-17 MED ORDER — SODIUM CHLORIDE 0.9% FLUSH
10.0000 mL | Freq: Once | INTRAVENOUS | Status: DC | PRN
Start: 2023-04-17 — End: 2023-04-17

## 2023-04-17 MED ORDER — SODIUM CHLORIDE 0.9 % IV SOLN: Freq: Once | INTRAVENOUS | Status: AC

## 2023-04-17 MED ORDER — SODIUM CHLORIDE 0.9 % IV SOLN
510.0000 mg | Freq: Once | INTRAVENOUS | Status: AC
  Administered 2023-04-17: 510 mg via INTRAVENOUS
  Filled 2023-04-17: qty 510

## 2023-04-17 MED ORDER — SODIUM CHLORIDE 0.9% FLUSH
3.0000 mL | Freq: Once | INTRAVENOUS | Status: DC | PRN
Start: 2023-04-17 — End: 2023-04-17

## 2023-04-20 ENCOUNTER — Other Ambulatory Visit: Payer: Self-pay

## 2023-04-20 DIAGNOSIS — D500 Iron deficiency anemia secondary to blood loss (chronic): Secondary | ICD-10-CM

## 2023-04-24 ENCOUNTER — Inpatient Hospital Stay

## 2023-04-24 ENCOUNTER — Inpatient Hospital Stay: Attending: Hematology & Oncology

## 2023-04-24 VITALS — BP 150/55 | HR 52 | Temp 98.1°F | Resp 18

## 2023-04-24 DIAGNOSIS — Z887 Allergy status to serum and vaccine status: Secondary | ICD-10-CM | POA: Insufficient documentation

## 2023-04-24 DIAGNOSIS — Z79899 Other long term (current) drug therapy: Secondary | ICD-10-CM | POA: Diagnosis not present

## 2023-04-24 DIAGNOSIS — Z882 Allergy status to sulfonamides status: Secondary | ICD-10-CM | POA: Diagnosis not present

## 2023-04-24 DIAGNOSIS — D509 Iron deficiency anemia, unspecified: Secondary | ICD-10-CM | POA: Insufficient documentation

## 2023-04-24 DIAGNOSIS — Z888 Allergy status to other drugs, medicaments and biological substances status: Secondary | ICD-10-CM | POA: Diagnosis not present

## 2023-04-24 DIAGNOSIS — Z881 Allergy status to other antibiotic agents status: Secondary | ICD-10-CM | POA: Diagnosis not present

## 2023-04-24 DIAGNOSIS — Z880 Allergy status to penicillin: Secondary | ICD-10-CM | POA: Diagnosis not present

## 2023-04-24 DIAGNOSIS — R0602 Shortness of breath: Secondary | ICD-10-CM | POA: Diagnosis not present

## 2023-04-24 DIAGNOSIS — Z885 Allergy status to narcotic agent status: Secondary | ICD-10-CM | POA: Insufficient documentation

## 2023-04-24 DIAGNOSIS — M7989 Other specified soft tissue disorders: Secondary | ICD-10-CM | POA: Diagnosis not present

## 2023-04-24 DIAGNOSIS — Z886 Allergy status to analgesic agent status: Secondary | ICD-10-CM | POA: Diagnosis not present

## 2023-04-24 DIAGNOSIS — D500 Iron deficiency anemia secondary to blood loss (chronic): Secondary | ICD-10-CM

## 2023-04-24 LAB — CBC WITH DIFFERENTIAL (CANCER CENTER ONLY)
Abs Immature Granulocytes: 0.03 10*3/uL (ref 0.00–0.07)
Basophils Absolute: 0.1 10*3/uL (ref 0.0–0.1)
Basophils Relative: 1 %
Eosinophils Absolute: 0.4 10*3/uL (ref 0.0–0.5)
Eosinophils Relative: 5 %
HCT: 32.6 % — ABNORMAL LOW (ref 36.0–46.0)
Hemoglobin: 10.2 g/dL — ABNORMAL LOW (ref 12.0–15.0)
Immature Granulocytes: 0 %
Lymphocytes Relative: 13 %
Lymphs Abs: 0.9 10*3/uL (ref 0.7–4.0)
MCH: 25.3 pg — ABNORMAL LOW (ref 26.0–34.0)
MCHC: 31.3 g/dL (ref 30.0–36.0)
MCV: 80.9 fL (ref 80.0–100.0)
Monocytes Absolute: 0.8 10*3/uL (ref 0.1–1.0)
Monocytes Relative: 12 %
Neutro Abs: 5 10*3/uL (ref 1.7–7.7)
Neutrophils Relative %: 69 %
Platelet Count: 271 10*3/uL (ref 150–400)
RBC: 4.03 MIL/uL (ref 3.87–5.11)
RDW: 21.4 % — ABNORMAL HIGH (ref 11.5–15.5)
WBC Count: 7.2 10*3/uL (ref 4.0–10.5)
nRBC: 0 % (ref 0.0–0.2)

## 2023-04-24 LAB — SAMPLE TO BLOOD BANK

## 2023-04-24 MED ORDER — SODIUM CHLORIDE 0.9 % IV SOLN: Freq: Once | INTRAVENOUS | Status: AC

## 2023-04-24 MED ORDER — SODIUM CHLORIDE 0.9 % IV SOLN
510.0000 mg | Freq: Once | INTRAVENOUS | Status: AC
  Administered 2023-04-24: 510 mg via INTRAVENOUS
  Filled 2023-04-24: qty 17

## 2023-04-24 NOTE — Progress Notes (Signed)
Pt declined to stay for post infusion observation period. Pt stated she has tolerated medication multiple times prior without difficulty. Pt aware to call clinic with any questions or concerns. Pt verbalized understanding and had no further questions.  ? ?

## 2023-04-24 NOTE — Patient Instructions (Signed)

## 2023-04-28 ENCOUNTER — Inpatient Hospital Stay: Payer: Medicare Other

## 2023-04-28 DIAGNOSIS — I872 Venous insufficiency (chronic) (peripheral): Secondary | ICD-10-CM | POA: Diagnosis not present

## 2023-04-29 DIAGNOSIS — M7989 Other specified soft tissue disorders: Secondary | ICD-10-CM | POA: Diagnosis not present

## 2023-04-29 DIAGNOSIS — I82401 Acute embolism and thrombosis of unspecified deep veins of right lower extremity: Secondary | ICD-10-CM | POA: Diagnosis not present

## 2023-05-01 DIAGNOSIS — M7061 Trochanteric bursitis, right hip: Secondary | ICD-10-CM | POA: Diagnosis not present

## 2023-05-11 NOTE — Therapy (Signed)
 OUTPATIENT PHYSICAL THERAPY LOWER EXTREMITY EVALUATION   Patient Name: Angela Gibbs MRN: [id number] DOB:[birth year], 83 y.o., female Today's Date: 05/12/2023  END OF SESSION:  PT End of Session - 05/12/23 1100     Visit Number 1    Number of Visits 17    Date for PT Re-Evaluation 07/11/23    Authorization Type MCR    Progress Note Due on Visit 10    PT Start Time 1100    PT Stop Time 1140    PT Time Calculation (min) 40 min    Activity Tolerance Patient tolerated treatment well    Behavior During Therapy [facility] for tasks assessed/performed             Past Medical History:  Diagnosis Date   Abdominal pain, acute, right upper quadrant 07/29/2013   Allergy    Aortic atherosclerosis (HCC) 01/21/2023   Body mass index (BMI) 31.0-31.9, adult 12/13/2021   Cataracts, bilateral 06/06/2013   Chalazion of right upper eyelid 06/06/2014   Concussion    Current use of beta blocker 04/11/2016   DOE (dyspnea on exertion) 12/25/2022   Dyspnea on exertion 12/25/2021   Eczema 12/24/2022   Essential hypertension 02/20/2016   Eyelid lesion 06/06/2013   Falls    Fatigue 12/24/2022   Gastroesophageal reflux disease without esophagitis 04/11/2016   Head injury    Herpes    IDA (iron deficiency anemia) 01/11/2020   Inflammatory polyarthropathy (HCC) 12/24/2022   Iron deficiency anemia due to chronic blood loss 12/28/2019   Joint pain 12/24/2022   Lymphedema 12/24/2022   Migraines    Moderate persistent asthma without complication 05/13/2016   Nerve damage    Nuclear sclerosis of both eyes 06/06/2014   Obesity (BMI 30.0-34.9) 12/25/2021   Osteoarthritis 12/07/2019   Osteopenia 08/12/2022   Other allergic rhinitis 04/11/2016   Positive FIT (fecal immunochemical test) 12/28/2019   Psoriasis 12/07/2019   Psoriatic arthritis (HCC) 12/07/2019   Managed by rheumatology   PVD (posterior vitreous detachment), both eyes 03/19/2014   Restrictive lung disease 05/13/2016   Related to  scoliosis and hiatal hernia   Retinal tear of right eye 02/13/2014   Shingles    Sjogren syndrome, unspecified (HCC) 12/24/2022   TIA (transient ischemic attack) 05/18/2019   Past Surgical History:  Procedure Laterality Date   BIOPSY  03/12/2023   Procedure: BIOPSY;  Surgeon: Kathi Der, MD;  Location: [facility];  Service: Gastroenterology;;   BRAIN SURGERY     CRANIOTOMY FOR EXTRACRANIAL-INTRACRANIAL BYPASS     ESOPHAGOGASTRODUODENOSCOPY (EGD) WITH PROPOFOL N/A 03/12/2023   Procedure: ESOPHAGOGASTRODUODENOSCOPY (EGD) WITH PROPOFOL;  Surgeon: Kathi Der, MD;  Location: [facility];  Service: Gastroenterology;  Laterality: N/A;   EYE SURGERY  4.4.17   milia excision   HIP SURGERY     intracranial surgery   NERVE SURGERY     ROOT CANAL     TONSILLECTOMY     Patient Active Problem List   Diagnosis Date Noted   Hyponatremia 03/10/2023   Upper GI bleed 03/06/2023   Mixed dyslipidemia 01/28/2023   Aortic atherosclerosis (HCC) 01/21/2023   DOE (dyspnea on exertion) 12/25/2022   Eczema 12/24/2022   Fatigue 12/24/2022   Inflammatory polyarthropathy (HCC) 12/24/2022   Joint pain 12/24/2022   Lymphedema 12/24/2022   Sjogren syndrome, unspecified (HCC) 12/24/2022   Osteopenia 08/12/2022   Dyspnea on exertion 12/25/2021   Obesity (BMI 30.0-34.9) 12/25/2021   Allergy 12/13/2021   Concussion 12/13/2021   Falls 12/13/2021   Head injury 12/13/2021  Herpes 12/13/2021   Migraines 12/13/2021   Nerve damage 12/13/2021   Shingles 12/13/2021   Body mass index (BMI) 31.0-31.9, adult 12/13/2021   IDA (iron deficiency anemia) 01/11/2020   Positive FIT (fecal immunochemical test) 12/28/2019   Iron deficiency anemia due to chronic blood loss 12/28/2019   Psoriatic arthritis (HCC) 12/07/2019   Psoriasis 12/07/2019   Osteoarthritis 12/07/2019   TIA (transient ischemic attack) 05/18/2019   Restrictive lung disease 05/13/2016   Moderate persistent asthma without complication  05/13/2016   Current use of beta blocker 04/11/2016   Other allergic rhinitis 04/11/2016   Gastroesophageal reflux disease without esophagitis 04/11/2016   Essential hypertension 02/20/2016   Chalazion of right upper eyelid 06/06/2014   Nuclear sclerosis of both eyes 06/06/2014   PVD (posterior vitreous detachment), both eyes 03/19/2014   Retinal tear of right eye 02/13/2014   Abdominal pain, acute, right upper quadrant 07/29/2013   Cataracts, bilateral 06/06/2013   Eyelid lesion 06/06/2013    PCP: Copland, Gwenlyn Found, MD   REFERRING PROVIDER: M70.61 (ICD-10-CM) - Trochanteric bursitis, right hip   REFERRING DIAG: M70.61 (ICD-10-CM) - Trochanteric bursitis, right hip   THERAPY DIAG:  Pain in right hip  Other abnormalities of gait and mobility  Muscle weakness (generalized)  Rationale for Evaluation and Treatment: Rehabilitation  ONSET DATE: chronic with exacerbation in February 2025   SUBJECTIVE:   SUBJECTIVE STATEMENT: Patient reports having a hip replacement about 10 years ago and calls it a "botched job." She was on pain medication for 7 years due to horrible pain following this surgery. Patient was able to wean from pain medication and eventually got used to the pain. The pain stopped a few years ago, but about 6 weeks ago she started having hip pain again. She does not recall a MOI to cause an exacerbation of pain. She has to heat her hip and take advil in the morning, which is helping. This is what the doctor had told her to do.   PERTINENT HISTORY: Patient reports recent blood transfusion that is causing fatigue- iron deficiency anemia chronic blood loss (per patient she is not making enough blood and is followed by hematology)  DOE TIA Rt THA >10 years ago  Osteopenia  PAIN:  Are you having pain? Yes: NPRS scale: none currently; 10 at worst Pain location: Rt lateral hip Pain description: throbbing  Aggravating factors: walking, stair negotiation, standing, NuStep   Relieving factors: heat, advil   PRECAUTIONS: Other: mindful of recent transfusion   RED FLAGS: None   WEIGHT BEARING RESTRICTIONS: No  FALLS:  Has patient fallen in last 6 months? No  LIVING ENVIRONMENT: Lives with: lives with their spouse Lives in: House/apartment Stairs: Yes: Internal: flight steps; on left going up Has following equipment at home: Dan Humphreys - 2 wheeled and Crutches  OCCUPATION: retired   PLOF: Independent  PATIENT GOALS: "I want to get to walking."  NEXT MD VISIT: return if pain continues   OBJECTIVE:  Note: Objective measures were completed at Evaluation unless otherwise noted.  DIAGNOSTIC FINDINGS:  No recent imaging on file  PATIENT SURVEYS:  Patient-specific activity scoring scheme (Point to one number):  "0" represents "unable to perform." "10" represents "able to perform at prior level. 0 1 2 3 4 5 6 7 8 9  10 (Date and Score) Activity Initial  Activity Eval     Walking   4    Stair negotiation   4         Additional Additional Total score =  sum of the activity scores/number of activities Minimum detectable change (90%CI) for average score = 2 points Minimum detectable change (90%CI) for single activity score = 3 points PSFS developed by: Jake Seats., & Binkley, J. (1995). Assessing disability and change on individual  patients: a report of a patient specific measure. Physiotherapy [place], 47, [phone]. Reproduced with the permission of the authors  Score: 4    COGNITION: Overall cognitive status: Within functional limits for tasks assessed     SENSATION: WFL    MUSCLE LENGTH: Hamstrings: Right lacking 42 deg; Left lacking 40 deg   POSTURE: increased thoracic kyphosis and knees flexed in standing   PALPATION: TTP Rt greater trochanter   LOWER EXTREMITY ROM:  Passive ROM Right eval Left eval  Hip flexion 90   Hip extension    Hip abduction    Hip adduction    Hip internal rotation WFL    Hip external rotation [facility]   Knee flexion    Knee extension    Ankle dorsiflexion    Ankle plantarflexion    Ankle inversion    Ankle eversion     (Blank rows = not tested)  LOWER EXTREMITY MMT:  MMT Right eval Left eval  Hip flexion 4- 4-  Hip extension    Hip abduction 4- 4-  Hip adduction    Hip internal rotation    Hip external rotation    Knee flexion 4 4  Knee extension 4 4  Ankle dorsiflexion    Ankle plantarflexion    Ankle inversion    Ankle eversion     (Blank rows = not tested)   FUNCTIONAL TESTS:  30 seconds chair stand test: 7 reps   GAIT: Distance walked: 10 ft  Assistive device utilized: None Level of assistance: Complete Independence Comments: short step length, limited trunk rotation, forward flexed posture                                                                                                                                 [facility] Adult PT Treatment:                                                DATE: 05/12/23 Therapeutic Exercise: Demonstrated,performed, and issued initial HEP.       PATIENT EDUCATION:  Education details: see treatment; POC Person educated: Patient Education method: Explanation, Demonstration, Tactile cues, Verbal cues, and Handouts Education comprehension: verbalized understanding, returned demonstration, verbal cues required, tactile cues required, and needs further education  HOME EXERCISE PROGRAM: Access Code: [email or web address] URL: [email or web address] Date: 05/12/2023 Prepared by: Letitia Libra  Exercises - Hooklying Single Knee to Chest Stretch with Towel  - 2 x daily - 7 x weekly - 1 sets - 10 reps - 5 sec  hold - Seated  Hamstring Stretch  - 2 x daily - 7 x weekly - 3 sets - 30 sec  hold - Seated Hip Abduction with Resistance  - 2 x daily - 7 x weekly - 2 sets - 10 reps - Seated March with Resistance  - 2 x daily - 7 x weekly - 2 sets - 10 reps  ASSESSMENT:  CLINICAL IMPRESSION: Patient is a  83 y.o. female who was seen today for physical therapy evaluation and treatment for acute on chronic Rt hip pain with most recent exacerbation occurring 6 weeks ago of insidious onset. She has history of THA > 10 years ago and reports chronic pain associated with this procedure. Upon assessment she is noted to have palpable tenderness about greater trochanter, limited hip flexion ROM, hamstring tightness, hip/knee weakness, and gait abnormalities. In addition she is currently being treated by hematology with recent infusion due to iron deficiency anemia due to chronic blood loss, which might interfere with her overall tolerance to PT. Patient will benefit from skilled PT to address the above stated deficits in order to optimize her function and assist in overall pain reduction.   OBJECTIVE IMPAIRMENTS: Abnormal gait, decreased activity tolerance, decreased endurance, decreased knowledge of condition, decreased mobility, difficulty walking, decreased ROM, decreased strength, increased fascial restrictions, impaired flexibility, improper body mechanics, postural dysfunction, and pain.   ACTIVITY LIMITATIONS: carrying, lifting, standing, squatting, stairs, transfers, and locomotion level  PARTICIPATION LIMITATIONS: cleaning, driving, shopping, and community activity  PERSONAL FACTORS: Age, Fitness, Time since onset of injury/illness/exacerbation, and 3+ comorbidities: see PMH above  are also affecting patient's functional outcome.   REHAB POTENTIAL: Fair see personal factors  CLINICAL DECISION MAKING: Evolving/moderate complexity  EVALUATION COMPLEXITY: Moderate   GOALS: Goals reviewed with patient? Yes  SHORT TERM GOALS: Target date: 06/09/2023   Patient will be independent and compliant with initial HEP.   Baseline: issued at eval  Goal status: INITIAL  2.  Patient will improve hamstring flexibility by at least 10 degrees to reduce stress on the hip.  Baseline: see above  Goal status:  INITIAL  3.  Patient will complete at least 10 reps during 30 second sit to stand test to improve functional strength.  Baseline: 7  Goal status: INITIAL   LONG TERM GOALS: Target date: 07/11/23  Patient will demonstrate at least 4+/5 bilateral hip strength to improve stability about the chain with prolonged walking activity.  Baseline: see above Goal status: INITIAL  2.  Patient will demonstrate 5/5 bilateral knee strength to improve stability with stair negotiation.  Baseline: see above Goal status: INITIAL  3.  Patient will score at least a 6 on the PSFS to signify clinically meaningful improvement in functional abilities.  Baseline: 4 Goal status: INITIAL  4.  Patient will report pain at worst rated as </= 5/10 to improve ability to participate in community activity Baseline: is limited in church and other community activity due to 10/10 pain Goal status: INITIAL  5.  Patient will be independent with advanced home program to progress/maintain current level of function.  Baseline: initial HEP issued  Goal status: INITIAL    PLAN:  PT FREQUENCY: 1-2x/week (will start with 1 x week to determine tolerance to PT)  PT DURATION: 8 weeks  PLANNED INTERVENTIONS: 97164- PT Re-evaluation, 97110-Therapeutic exercises, 97530- Therapeutic activity, 97112- Neuromuscular re-education, 97535- Self Care, 16109- Manual therapy, 97116- Gait training, Taping, Dry Needling, Cryotherapy, and Moist heat  PLAN FOR NEXT SESSION: review and progress HEP prn; begin with mat  based strengthening due to decreased activity tolerance due to other medical conditions. Hamstring stretching, IT band stretching.    Letitia Libra, PT, DPT, ATC 05/12/23 12:53 PM

## 2023-05-12 ENCOUNTER — Inpatient Hospital Stay (HOSPITAL_BASED_OUTPATIENT_CLINIC_OR_DEPARTMENT_OTHER): Payer: Medicare Other | Admitting: Family

## 2023-05-12 ENCOUNTER — Ambulatory Visit: Attending: Orthopedic Surgery

## 2023-05-12 ENCOUNTER — Other Ambulatory Visit: Payer: Self-pay

## 2023-05-12 ENCOUNTER — Inpatient Hospital Stay: Payer: Medicare Other

## 2023-05-12 ENCOUNTER — Encounter: Payer: Self-pay | Admitting: Family

## 2023-05-12 VITALS — BP 134/63 | HR 56 | Temp 97.9°F | Resp 18 | Wt 184.4 lb

## 2023-05-12 DIAGNOSIS — M6281 Muscle weakness (generalized): Secondary | ICD-10-CM | POA: Insufficient documentation

## 2023-05-12 DIAGNOSIS — D500 Iron deficiency anemia secondary to blood loss (chronic): Secondary | ICD-10-CM | POA: Diagnosis not present

## 2023-05-12 DIAGNOSIS — R2689 Other abnormalities of gait and mobility: Secondary | ICD-10-CM | POA: Diagnosis not present

## 2023-05-12 DIAGNOSIS — M25551 Pain in right hip: Secondary | ICD-10-CM | POA: Insufficient documentation

## 2023-05-12 DIAGNOSIS — M7989 Other specified soft tissue disorders: Secondary | ICD-10-CM | POA: Diagnosis not present

## 2023-05-12 DIAGNOSIS — Z79899 Other long term (current) drug therapy: Secondary | ICD-10-CM | POA: Diagnosis not present

## 2023-05-12 DIAGNOSIS — D509 Iron deficiency anemia, unspecified: Secondary | ICD-10-CM | POA: Diagnosis not present

## 2023-05-12 DIAGNOSIS — Z880 Allergy status to penicillin: Secondary | ICD-10-CM | POA: Diagnosis not present

## 2023-05-12 DIAGNOSIS — R0602 Shortness of breath: Secondary | ICD-10-CM | POA: Diagnosis not present

## 2023-05-12 DIAGNOSIS — Z881 Allergy status to other antibiotic agents status: Secondary | ICD-10-CM | POA: Diagnosis not present

## 2023-05-12 LAB — CBC WITH DIFFERENTIAL (CANCER CENTER ONLY)
Abs Immature Granulocytes: 0.02 10*3/uL (ref 0.00–0.07)
Basophils Absolute: 0.1 10*3/uL (ref 0.0–0.1)
Basophils Relative: 1 %
Eosinophils Absolute: 0.2 10*3/uL (ref 0.0–0.5)
Eosinophils Relative: 3 %
HCT: 36 % (ref 36.0–46.0)
Hemoglobin: 11.7 g/dL — ABNORMAL LOW (ref 12.0–15.0)
Immature Granulocytes: 0 %
Lymphocytes Relative: 15 %
Lymphs Abs: 0.9 10*3/uL (ref 0.7–4.0)
MCH: 27.6 pg (ref 26.0–34.0)
MCHC: 32.5 g/dL (ref 30.0–36.0)
MCV: 84.9 fL (ref 80.0–100.0)
Monocytes Absolute: 0.6 10*3/uL (ref 0.1–1.0)
Monocytes Relative: 10 %
Neutro Abs: 4.3 10*3/uL (ref 1.7–7.7)
Neutrophils Relative %: 71 %
Platelet Count: 218 10*3/uL (ref 150–400)
RBC: 4.24 MIL/uL (ref 3.87–5.11)
RDW: 23.9 % — ABNORMAL HIGH (ref 11.5–15.5)
WBC Count: 6.1 10*3/uL (ref 4.0–10.5)
nRBC: 0 % (ref 0.0–0.2)

## 2023-05-12 LAB — IRON AND IRON BINDING CAPACITY (CC-WL,HP ONLY)
Iron: 66 ug/dL (ref 28–170)
Saturation Ratios: 17 % (ref 10.4–31.8)
TIBC: 385 ug/dL (ref 250–450)
UIBC: 319 ug/dL (ref 148–442)

## 2023-05-12 LAB — RETICULOCYTES
Immature Retic Fract: 5.5 % (ref 2.3–15.9)
RBC.: 4.14 MIL/uL (ref 3.87–5.11)
Retic Count, Absolute: 67.5 10*3/uL (ref 19.0–186.0)
Retic Ct Pct: 1.6 % (ref 0.4–3.1)

## 2023-05-12 LAB — FERRITIN: Ferritin: 257 ng/mL (ref 11–307)

## 2023-05-12 NOTE — Progress Notes (Signed)
 Hematology and Oncology Follow Up Visit  Angela Gibbs [id number] [birth year] 83 y.o. 05/12/2023   Principle Diagnosis:  Iron deficiency anemia   Current Therapy:        IV iron as indicated    Interim History:  Angela Gibbs is here today with her husband for follow-up. She is doing well but does note SOB with history of restrictive airway disease.  This was worsened with anemia.  No obvious blood loss since her last visit.  No bruising or petechiae.  No fever, chills, n/v, cough, rash, dizziness, chest pain, palpitations, abdominal pain or changes in bowel or bladder habits.   She has swelling in both lower extremities. No redness but there is mild putting at the ankles. Pedal pulses are 1+. Negative Homan's sign.  No injury. No falls or syncope.  Appetite and hydration are good. Weight is 184 lbs.   ECOG Performance Status: 1 - Symptomatic but completely ambulatory  Medications:  Allergies as of 05/12/2023       Reactions   Cephalosporins Shortness Of Breath, Other (See Comments)   Breathing difficulty   Ciprofloxacin Hives, Shortness Of Breath   Influenza Virus Vaccine Other (See Comments)   Numbness in legs   Keflex [cephalexin] Hives   Latex Hives, Itching, Rash   Levaquin [levofloxacin] Hives, Palpitations, Other (See Comments)   Chest pain   Nitrofuran Derivatives Hives, Itching, Swelling   Penicillins Hives   Sulfacetamide Sodium Hives   Tape Itching, Other (See Comments)   RED AND IRRITATED SKIN   Xopenex [levalbuterol Hcl] Anxiety, Other (See Comments)   Tremors with violent body chattering and shaking   Zostavax [zoster Vaccine Live] Other (See Comments)   Pt has been told by specialists not to receive vaccine d/t h/o herpes in bloodstream and shingles.   Erythromycin Other (See Comments)   "stomach issues"   Hydrochlorothiazide Other (See Comments)   Migraine   Lisinopril Cough   Petrolatum Nausea Only   Pregabalin Other (See Comments)   Ropinirole Hcl  Nausea And Vomiting   Shellfish Allergy Hives   Sulfa Antibiotics Hives   Golimumab Other (See Comments)   "Felt poorly" (Simponi)   Ibuprofen Other (See Comments)   Stomach upset   Lactose Other (See Comments)   "stomach issues" Other Reaction(s): GI Intolerance   Aspirin Other (See Comments)   Stomach pain/burning   Avelox [moxifloxacin Hcl In Nacl] Nausea Only   Cefdinir Nausea And Vomiting   Chromium Other (See Comments)   Blisters   Egg Solids, Whole Other (See Comments)   Per allergy testing, no known reaction. Pt states she was told she could probably eat 2-3 eggs weekly w/o issue.   Egg-derived Products    Per allergy testing, no known reaction. Pt states she was told she could probably eat 2-3 eggs weekly w/o issue.   Iodinated Contrast Media Other (See Comments)   unknown   Lactose Intolerance (gi) Other (See Comments)   "stomach issues"   Percodan [oxycodone-aspirin] Nausea And Vomiting   Prednisone Palpitations   No injections, can do the pills   Troleandomycin Other (See Comments)   Unknown reaction        Medication List        Accurate as of May 12, 2023 12:57 PM. If you have any questions, ask your nurse or doctor.          ALPRAZolam 0.25 MG tablet Commonly known as: XANAX TAKE 1 TABLET(0.25 MG) BY MOUTH TWICE DAILY AS NEEDED FOR  ANXIETY What changed: See the new instructions.   atorvastatin 10 MG tablet Commonly known as: LIPITOR Take 1 tablet (10 mg total) by mouth daily.   butalbital-acetaminophen-caffeine 50-325-40 MG tablet Commonly known as: FIORICET Take 1-2 tablets by mouth 2 (two) times daily as needed for headache (or leg pain).   FLUoxetine 20 MG capsule Commonly known as: PROZAC TAKE 1 CAPSULE(20 MG) BY MOUTH DAILY What changed: See the new instructions.   MAGNESIUM GLYCINATE PO Take 50 mg by mouth daily.   montelukast 10 MG tablet Commonly known as: SINGULAIR TAKE 1 TABLET(10 MG) BY MOUTH AT BEDTIME What changed: See  the new instructions.   NP Thyroid 30 MG tablet Generic drug: thyroid Take 30 mg by mouth daily before breakfast.   pantoprazole 40 MG tablet Commonly known as: Protonix Take 1 tablet (40 mg total) by mouth daily.   Prednicarbate 0.1 % Crea Apply 1 Application topically as needed for itching (affected sites).   Refresh Tears PF 0.5-0.9 % Soln Generic drug: Carboxymethylcell-Glycerin PF Place 1 drop into both eyes 3 (three) times daily as needed (for irritation).   sodium chloride 0.65 % nasal spray Commonly known as: V-R NASAL SPRAY SALINE Place 1 spray into the nose as needed. Use in both nostrils twice daily What changed:  reasons to take this additional instructions   spironolactone 25 MG tablet Commonly known as: ALDACTONE TAKE 1/2 TABLET(12.5 MG) BY MOUTH DAILY What changed: See the new instructions.   UNKNOWN TO PATIENT Take 1 tablet by mouth See admin instructions. Unnamed OTC for restless legs: Take 1 tablet by mouth every other night   valsartan 320 MG tablet Commonly known as: DIOVAN TAKE 1 TABLET(320 MG) BY MOUTH DAILY What changed: See the new instructions.   vitamin C 1000 MG tablet Take 1,000 mg by mouth daily.   Vitamin D3 125 MCG (5000 UT) Caps Take 5,000 Units by mouth daily.        Allergies:  Allergies  Allergen Reactions   Cephalosporins Shortness Of Breath and Other (See Comments)    Breathing difficulty   Ciprofloxacin Hives and Shortness Of Breath   Influenza Virus Vaccine Other (See Comments)    Numbness in legs   Keflex [Cephalexin] Hives   Latex Hives, Itching and Rash   Levaquin [Levofloxacin] Hives, Palpitations and Other (See Comments)    Chest pain   Nitrofuran Derivatives Hives, Itching and Swelling   Penicillins Hives   Sulfacetamide Sodium Hives   Tape Itching and Other (See Comments)    RED AND IRRITATED SKIN   Xopenex [Levalbuterol Hcl] Anxiety and Other (See Comments)    Tremors with violent body chattering and  shaking   Zostavax [Zoster Vaccine Live] Other (See Comments)    Pt has been told by specialists not to receive vaccine d/t h/o herpes in bloodstream and shingles.   Erythromycin Other (See Comments)    "stomach issues"   Hydrochlorothiazide Other (See Comments)    Migraine   Lisinopril Cough   Petrolatum Nausea Only   Pregabalin Other (See Comments)   Ropinirole Hcl Nausea And Vomiting   Shellfish Allergy Hives   Sulfa Antibiotics Hives   Golimumab Other (See Comments)    "Felt poorly" (Simponi)   Ibuprofen Other (See Comments)    Stomach upset   Lactose Other (See Comments)    "stomach issues"  Other Reaction(s): GI Intolerance   Aspirin Other (See Comments)    Stomach pain/burning   Avelox [Moxifloxacin Hcl In Nacl] Nausea Only  Cefdinir Nausea And Vomiting   Chromium Other (See Comments)    Blisters   Egg Solids, Whole Other (See Comments)    Per allergy testing, no known reaction. Pt states she was told she could probably eat 2-3 eggs weekly w/o issue.   Egg-Derived Products     Per allergy testing, no known reaction. Pt states she was told she could probably eat 2-3 eggs weekly w/o issue.   Iodinated Contrast Media Other (See Comments)    unknown    Lactose Intolerance (Gi) Other (See Comments)    "stomach issues"   Percodan [Oxycodone-Aspirin] Nausea And Vomiting   Prednisone Palpitations    No injections, can do the pills   Troleandomycin Other (See Comments)    Unknown reaction    Past Medical History, Surgical history, Social history, and Family History were reviewed and updated.  Review of Systems: All other 10 point review of systems is negative.   Physical Exam:  vitals were not taken for this visit.   Wt Readings from Last 3 Encounters:  04/14/23 184 lb 1.9 oz (83.5 kg)  04/01/23 186 lb 12.8 oz (84.7 kg)  03/10/23 187 lb 6.3 oz (85 kg)    Ocular: Sclerae unicteric, pupils equal, round and reactive to light Ear-nose-throat: Oropharynx clear,  dentition fair Lymphatic: No cervical or supraclavicular adenopathy Lungs no rales or rhonchi, good excursion bilaterally Heart regular rate and rhythm, no murmur appreciated Abd soft, nontender, positive bowel sounds MSK no focal spinal tenderness, no joint edema Neuro: non-focal, well-oriented, appropriate affect Breasts: Deferred   Lab Results  Component Value Date   WBC 7.2 04/24/2023   HGB 10.2 (L) 04/24/2023   HCT 32.6 (L) 04/24/2023   MCV 80.9 04/24/2023   PLT 271 04/24/2023   Lab Results  Component Value Date   FERRITIN 9 (L) 04/14/2023   IRON 14 (L) 04/14/2023   TIBC 549 (H) 04/14/2023   UIBC 535 (H) 04/14/2023   IRONPCTSAT 3 (L) 04/14/2023   Lab Results  Component Value Date   RETICCTPCT 1.1 04/14/2023   RBC 4.03 04/24/2023   No results found for: "KPAFRELGTCHN", "LAMBDASER", "KAPLAMBRATIO" No results found for: "IGGSERUM", "IGA", "IGMSERUM" No results found for: "TOTALPROTELP", "ALBUMINELP", "A1GS", "A2GS", "BETS", "BETA2SER", "GAMS", "MSPIKE", "SPEI"   Chemistry      Component Value Date/Time   NA 131 (L) 04/01/2023 1455   NA 133 (L) 02/16/2023 1341   K 4.8 04/01/2023 1455   CL 96 04/01/2023 1455   CO2 28 04/01/2023 1455   BUN 14 04/01/2023 1455   BUN 15 02/16/2023 1341   CREATININE 0.65 04/01/2023 1455   CREATININE 0.70 05/01/2021 1430   CREATININE 0.76 12/07/2019 1413   GLU 80 11/18/2019 0000      Component Value Date/Time   CALCIUM 8.9 04/01/2023 1455   ALKPHOS 64 03/11/2023 0350   AST 28 03/11/2023 0350   AST 18 05/01/2021 1430   ALT 33 03/11/2023 0350   ALT 16 05/01/2021 1430   BILITOT 1.0 03/11/2023 0350   BILITOT 0.3 01/22/2023 1602   BILITOT 0.4 05/01/2021 1430       Impression and Plan: [name] is a very pleasant Angela Gibbs with history of iron deficiency anemia.  We will get her set up for two doses of IV iron.  Follow-up in 2 months.    Eileen Stanford, NP 3/25/202512:57 PM

## 2023-05-13 ENCOUNTER — Telehealth: Payer: Self-pay | Admitting: Family

## 2023-05-13 ENCOUNTER — Telehealth: Payer: Self-pay

## 2023-05-13 NOTE — Telephone Encounter (Signed)
-----   Message from Eileen Stanford sent at 05/13/2023  8:53 AM EDT ----- Iron studies are much better!!! WOO HOO!!! No IV iron needed right now. ----- Message ----- From: Leory Plowman, [facility] [place] Sent: 05/12/2023  12:59 PM EDT To: Erenest Blank, NP

## 2023-05-13 NOTE — Telephone Encounter (Signed)
 Called to schedule a 2 mo f/u with Eileen Stanford based off of 05/12/23 los. Patient declined and then ended the phone call with me.

## 2023-05-13 NOTE — Telephone Encounter (Signed)
 Advised via MyChart.

## 2023-05-14 DIAGNOSIS — I872 Venous insufficiency (chronic) (peripheral): Secondary | ICD-10-CM | POA: Diagnosis not present

## 2023-05-19 ENCOUNTER — Ambulatory Visit: Attending: Orthopedic Surgery

## 2023-05-19 DIAGNOSIS — R2689 Other abnormalities of gait and mobility: Secondary | ICD-10-CM | POA: Insufficient documentation

## 2023-05-19 DIAGNOSIS — M25551 Pain in right hip: Secondary | ICD-10-CM | POA: Diagnosis not present

## 2023-05-19 DIAGNOSIS — M6281 Muscle weakness (generalized): Secondary | ICD-10-CM | POA: Insufficient documentation

## 2023-05-19 NOTE — Therapy (Signed)
 OUTPATIENT PHYSICAL THERAPY LOWER EXTREMITY TREATMENT   Patient Name: Angela Gibbs MRN: [id number] DOB:Mar 23, 1940, 83 y.o., female Today's Date: 05/19/2023  END OF SESSION:  PT End of Session - 05/19/23 1446     Visit Number 2    Number of Visits 17    Date for PT Re-Evaluation 07/11/23    Authorization Type MCR    Progress Note Due on Visit 10    PT Start Time 1446    PT Stop Time 1528    PT Time Calculation (min) 42 min    Activity Tolerance Patient tolerated treatment well    Behavior During Therapy [facility] for tasks assessed/performed              Past Medical History:  Diagnosis Date   Abdominal pain, acute, right upper quadrant 07/29/2013   Allergy    Aortic atherosclerosis (HCC) 01/21/2023   Body mass index (BMI) 31.0-31.9, adult 12/13/2021   Cataracts, bilateral 06/06/2013   Chalazion of right upper eyelid 06/06/2014   Concussion    Current use of beta blocker 04/11/2016   DOE (dyspnea on exertion) 12/25/2022   Dyspnea on exertion 12/25/2021   Eczema 12/24/2022   Essential hypertension 02/20/2016   Eyelid lesion 06/06/2013   Falls    Fatigue 12/24/2022   Gastroesophageal reflux disease without esophagitis 04/11/2016   Head injury    Herpes    IDA (iron deficiency anemia) 01/11/2020   Inflammatory polyarthropathy (HCC) 12/24/2022   Iron deficiency anemia due to chronic blood loss 12/28/2019   Joint pain 12/24/2022   Lymphedema 12/24/2022   Migraines    Moderate persistent asthma without complication 05/13/2016   Nerve damage    Nuclear sclerosis of both eyes 06/06/2014   Obesity (BMI 30.0-34.9) 12/25/2021   Osteoarthritis 12/07/2019   Osteopenia 08/12/2022   Other allergic rhinitis 04/11/2016   Positive FIT (fecal immunochemical test) 12/28/2019   Psoriasis 12/07/2019   Psoriatic arthritis (HCC) 12/07/2019   Managed by rheumatology   PVD (posterior vitreous detachment), both eyes 03/19/2014   Restrictive lung disease 05/13/2016   Related to  scoliosis and hiatal hernia   Retinal tear of right eye 02/13/2014   Shingles    Sjogren syndrome, unspecified (HCC) 12/24/2022   TIA (transient ischemic attack) 05/18/2019   Past Surgical History:  Procedure Laterality Date   BIOPSY  03/12/2023   Procedure: BIOPSY;  Surgeon: Kathi Der, MD;  Location: [facility];  Service: Gastroenterology;;   BRAIN SURGERY     CRANIOTOMY FOR EXTRACRANIAL-INTRACRANIAL BYPASS     ESOPHAGOGASTRODUODENOSCOPY (EGD) WITH PROPOFOL N/A 03/12/2023   Procedure: ESOPHAGOGASTRODUODENOSCOPY (EGD) WITH PROPOFOL;  Surgeon: Kathi Der, MD;  Location: [facility];  Service: Gastroenterology;  Laterality: N/A;   EYE SURGERY  4.4.17   milia excision   HIP SURGERY     intracranial surgery   NERVE SURGERY     ROOT CANAL     TONSILLECTOMY     Patient Active Problem List   Diagnosis Date Noted   Hyponatremia 03/10/2023   Upper GI bleed 03/06/2023   Mixed dyslipidemia 01/28/2023   Aortic atherosclerosis (HCC) 01/21/2023   DOE (dyspnea on exertion) 12/25/2022   Eczema 12/24/2022   Fatigue 12/24/2022   Inflammatory polyarthropathy (HCC) 12/24/2022   Joint pain 12/24/2022   Lymphedema 12/24/2022   Sjogren syndrome, unspecified (HCC) 12/24/2022   Osteopenia 08/12/2022   Dyspnea on exertion 12/25/2021   Obesity (BMI 30.0-34.9) 12/25/2021   Allergy 12/13/2021   Concussion 12/13/2021   Falls 12/13/2021   Head injury  12/13/2021   Herpes 12/13/2021   Migraines 12/13/2021   Nerve damage 12/13/2021   Shingles 12/13/2021   Body mass index (BMI) 31.0-31.9, adult 12/13/2021   IDA (iron deficiency anemia) 01/11/2020   Positive FIT (fecal immunochemical test) 12/28/2019   Iron deficiency anemia due to chronic blood loss 12/28/2019   Psoriatic arthritis (HCC) 12/07/2019   Psoriasis 12/07/2019   Osteoarthritis 12/07/2019   TIA (transient ischemic attack) 05/18/2019   Restrictive lung disease 05/13/2016   Moderate persistent asthma without complication  05/13/2016   Current use of beta blocker 04/11/2016   Other allergic rhinitis 04/11/2016   Gastroesophageal reflux disease without esophagitis 04/11/2016   Essential hypertension 02/20/2016   Chalazion of right upper eyelid 06/06/2014   Nuclear sclerosis of both eyes 06/06/2014   PVD (posterior vitreous detachment), both eyes 03/19/2014   Retinal tear of right eye 02/13/2014   Abdominal pain, acute, right upper quadrant 07/29/2013   Cataracts, bilateral 06/06/2013   Eyelid lesion 06/06/2013    PCP: Copland, Gwenlyn Found, MD   REFERRING PROVIDER: M70.61 (ICD-10-CM) - Trochanteric bursitis, right hip   REFERRING DIAG: M70.61 (ICD-10-CM) - Trochanteric bursitis, right hip   THERAPY DIAG:  Pain in right hip  Other abnormalities of gait and mobility  Muscle weakness (generalized)  Rationale for Evaluation and Treatment: Rehabilitation  ONSET DATE: chronic with exacerbation in February 2025   SUBJECTIVE:   SUBJECTIVE STATEMENT: Patient reports the hip is hurting mostly in the morning and at night before going to bed. Patient continues to be followed by hematology and did not require an infusion this week. Still experiences SOB, but overall fatigue is improving. She also was recommended manual lymphatic drainage by dermatologist.   EVAL: Patient reports having a hip replacement about 10 years ago and calls it a "botched job." She was on pain medication for 7 years due to horrible pain following this surgery. Patient was able to wean from pain medication and eventually got used to the pain. The pain stopped a few years ago, but about 6 weeks ago she started having hip pain again. She does not recall a MOI to cause an exacerbation of pain. She has to heat her hip and take advil in the morning, which is helping. This is what the doctor had told her to do.   PERTINENT HISTORY: Patient reports recent blood transfusion that is causing fatigue- iron deficiency anemia chronic blood loss (per  patient she is not making enough blood and is followed by hematology)  DOE TIA Rt THA >10 years ago  Osteopenia  PAIN:  Are you having pain? Yes: NPRS scale: none currently; 10 at worst Pain location: Rt lateral hip Pain description: throbbing  Aggravating factors: walking, stair negotiation, standing, NuStep  Relieving factors: heat, advil   PRECAUTIONS: Other: mindful of recent transfusion   RED FLAGS: None   WEIGHT BEARING RESTRICTIONS: No  FALLS:  Has patient fallen in last 6 months? No  LIVING ENVIRONMENT: Lives with: lives with their spouse Lives in: House/apartment Stairs: Yes: Internal: flight steps; on left going up Has following equipment at home: Dan Humphreys - 2 wheeled and Crutches  OCCUPATION: retired   PLOF: Independent  PATIENT GOALS: "I want to get to walking."  NEXT MD VISIT: return if pain continues   OBJECTIVE:  Note: Objective measures were completed at Evaluation unless otherwise noted.  DIAGNOSTIC FINDINGS:  No recent imaging on file  PATIENT SURVEYS:  Patient-specific activity scoring scheme (Point to one number):  "0" represents "unable to perform." "  10" represents "able to perform at prior level. 0 1 2 3 4 5 6 7 8 9  10 (Date and Score) Activity Initial  Activity Eval     Walking   4    Stair negotiation   4         Additional Additional Total score = sum of the activity scores/number of activities Minimum detectable change (90%CI) for average score = 2 points Minimum detectable change (90%CI) for single activity score = 3 points PSFS developed by: Jake Seats., & Binkley, J. (1995). Assessing disability and change on individual  patients: a report of a patient specific measure. Physiotherapy [place], 47, [phone]. Reproduced with the permission of the authors  Score: 4    COGNITION: Overall cognitive status: Within functional limits for tasks assessed     SENSATION: WFL    MUSCLE LENGTH: Hamstrings:  Right lacking 42 deg; Left lacking 40 deg   POSTURE: increased thoracic kyphosis and knees flexed in standing   PALPATION: TTP Rt greater trochanter   LOWER EXTREMITY ROM:  Passive ROM Right eval Left eval  Hip flexion 90   Hip extension    Hip abduction    Hip adduction    Hip internal rotation WFL   Hip external rotation [facility]   Knee flexion    Knee extension    Ankle dorsiflexion    Ankle plantarflexion    Ankle inversion    Ankle eversion     (Blank rows = not tested)  LOWER EXTREMITY MMT:  MMT Right eval Left eval  Hip flexion 4- 4-  Hip extension    Hip abduction 4- 4-  Hip adduction    Hip internal rotation    Hip external rotation    Knee flexion 4 4  Knee extension 4 4  Ankle dorsiflexion    Ankle plantarflexion    Ankle inversion    Ankle eversion     (Blank rows = not tested)   FUNCTIONAL TESTS:  30 seconds chair stand test: 7 reps   GAIT: Distance walked: 10 ft  Assistive device utilized: None Level of assistance: Complete Independence Comments: short step length, limited trunk rotation, forward flexed posture    [facility] Adult PT Treatment:                                                DATE: 05/19/23 Therapeutic Exercise: SpO2: 97%, HR 60 (checked due to recent transfusion secondary to anemia)  SKTC x 10 each; 5 sec hold  Seated hip abduction blue band 2 x 10  Seated march 2 x 10 blue band  LTR x 1 minute  Hip bridge 2 x 10  Hooklying adduction ball squeee 2 x 10; 5 sec hold  LAQ 2 x 10 @ 1.5 lbs  [facility] Adult PT Treatment:                                                DATE: 05/12/23 Therapeutic Exercise: Demonstrated,performed, and issued initial HEP.       PATIENT EDUCATION:  Education details: HEP review  Person educated: Patient Education method: Explanation, Demonstration, Tactile cues, and Verbal  cues Education comprehension: verbalized understanding, returned demonstration, verbal cues required, tactile cues required, and needs further education  HOME EXERCISE PROGRAM: Access Code: [email or web address] URL: [email or web address] Date: 05/12/2023 Prepared by: Letitia Libra  Exercises - Hooklying Single Knee to Chest Stretch with Towel  - 2 x daily - 7 x weekly - 1 sets - 10 reps - 5 sec  hold - Seated Hamstring Stretch  - 2 x daily - 7 x weekly - 3 sets - 30 sec  hold - Seated Hip Abduction with Resistance  - 2 x daily - 7 x weekly - 2 sets - 10 reps - Seated March with Resistance  - 2 x daily - 7 x weekly - 2 sets - 10 reps  ASSESSMENT:  CLINICAL IMPRESSION: Patient arrives without reports of hip pain. Reviewed initial HEP with patient requiring cues for set up of all exercises. She reported mild pain about Rt lateral hip with LTR and hip bridge, but overall good tolerance to mat based strengthening. She has SOB when ambulating into clinic. SpO2 and HR WNL at beginning of session and no SOB noted throughout session. Provided contact information for our speciality clinic as her dermatologist has recommended manual lymphatic draining for BLE swelling.   EVAL: Patient is a 83 y.o. female who was seen today for physical therapy evaluation and treatment for acute on chronic Rt hip pain with most recent exacerbation occurring 6 weeks ago of insidious onset. She has history of THA > 10 years ago and reports chronic pain associated with this procedure. Upon assessment she is noted to have palpable tenderness about greater trochanter, limited hip flexion ROM, hamstring tightness, hip/knee weakness, and gait abnormalities. In addition she is currently being treated by hematology with recent infusion due to iron deficiency anemia due to chronic blood loss, which might interfere with her overall tolerance to PT. Patient will benefit from skilled PT to address the above stated deficits in order to  optimize her function and assist in overall pain reduction.   OBJECTIVE IMPAIRMENTS: Abnormal gait, decreased activity tolerance, decreased endurance, decreased knowledge of condition, decreased mobility, difficulty walking, decreased ROM, decreased strength, increased fascial restrictions, impaired flexibility, improper body mechanics, postural dysfunction, and pain.   ACTIVITY LIMITATIONS: carrying, lifting, standing, squatting, stairs, transfers, and locomotion level  PARTICIPATION LIMITATIONS: cleaning, driving, shopping, and community activity  PERSONAL FACTORS: Age, Fitness, Time since onset of injury/illness/exacerbation, and 3+ comorbidities: see PMH above  are also affecting patient's functional outcome.   REHAB POTENTIAL: Fair see personal factors  CLINICAL DECISION MAKING: Evolving/moderate complexity  EVALUATION COMPLEXITY: Moderate   GOALS: Goals reviewed with patient? Yes  SHORT TERM GOALS: Target date: 06/09/2023   Patient will be independent and compliant with initial HEP.   Baseline: issued at eval  Goal status: INITIAL  2.  Patient will improve hamstring flexibility by at least 10 degrees to reduce stress on the hip.  Baseline: see above  Goal status: INITIAL  3.  Patient will complete at least 10 reps  during 30 second sit to stand test to improve functional strength.  Baseline: 7  Goal status: INITIAL   LONG TERM GOALS: Target date: 07/11/23  Patient will demonstrate at least 4+/5 bilateral hip strength to improve stability about the chain with prolonged walking activity.  Baseline: see above Goal status: INITIAL  2.  Patient will demonstrate 5/5 bilateral knee strength to improve stability with stair negotiation.  Baseline: see above Goal status: INITIAL  3.  Patient will score at least a 6 on the PSFS to signify clinically meaningful improvement in functional abilities.  Baseline: 4 Goal status: INITIAL  4.  Patient will report pain at worst rated  as </= 5/10 to improve ability to participate in community activity Baseline: is limited in church and other community activity due to 10/10 pain Goal status: INITIAL  5.  Patient will be independent with advanced home program to progress/maintain current level of function.  Baseline: initial HEP issued  Goal status: INITIAL    PLAN:  PT FREQUENCY: 1-2x/week (will start with 1 x week to determine tolerance to PT)  PT DURATION: 8 weeks  PLANNED INTERVENTIONS: 97164- PT Re-evaluation, 97110-Therapeutic exercises, 97530- Therapeutic activity, 97112- Neuromuscular re-education, 97535- Self Care, 16109- Manual therapy, 97116- Gait training, Taping, Dry Needling, Cryotherapy, and Moist heat  PLAN FOR NEXT SESSION: review and progress HEP prn; begin with mat based strengthening due to decreased activity tolerance due to other medical conditions. Hamstring stretching, IT band stretching.    Letitia Libra, PT, DPT, ATC 05/19/23 3:29 PM

## 2023-05-22 DIAGNOSIS — G5602 Carpal tunnel syndrome, left upper limb: Secondary | ICD-10-CM | POA: Diagnosis not present

## 2023-05-22 DIAGNOSIS — G5622 Lesion of ulnar nerve, left upper limb: Secondary | ICD-10-CM | POA: Diagnosis not present

## 2023-05-26 DIAGNOSIS — G5623 Lesion of ulnar nerve, bilateral upper limbs: Secondary | ICD-10-CM | POA: Diagnosis not present

## 2023-05-26 DIAGNOSIS — G5602 Carpal tunnel syndrome, left upper limb: Secondary | ICD-10-CM | POA: Diagnosis not present

## 2023-05-27 ENCOUNTER — Ambulatory Visit

## 2023-05-27 DIAGNOSIS — M25551 Pain in right hip: Secondary | ICD-10-CM

## 2023-05-27 DIAGNOSIS — M6281 Muscle weakness (generalized): Secondary | ICD-10-CM

## 2023-05-27 DIAGNOSIS — R2689 Other abnormalities of gait and mobility: Secondary | ICD-10-CM | POA: Diagnosis not present

## 2023-05-27 NOTE — Therapy (Addendum)
 " OUTPATIENT PHYSICAL THERAPY LOWER EXTREMITY TREATMENT  PHYSICAL THERAPY DISCHARGE SUMMARY  Visits from Start of Care: 3  Current functional level related to goals / functional outcomes: See goals below   Remaining deficits: Status unknown   Education / Equipment: N/A   Patient agrees to discharge. Patient goals were partially met. Patient is being discharged due to not returning since the last visit.  Patient Name: Angela Gibbs MRN: [id number] DOB:Nov 02, 1940, 83 y.o., female Today's Date: 05/27/2023  END OF SESSION:  PT End of Session - 05/27/23 1412     Visit Number 3    Number of Visits 17    Date for PT Re-Evaluation 07/11/23    Authorization Type MCR    Progress Note Due on Visit 10    PT Start Time 1412   patient late   PT Stop Time 1445    PT Time Calculation (min) 33 min    Activity Tolerance Patient tolerated treatment well    Behavior During Therapy [facility] for tasks assessed/performed               Past Medical History:  Diagnosis Date   Abdominal pain, acute, right upper quadrant 07/29/2013   Allergy    Aortic atherosclerosis (HCC) 01/21/2023   Body mass index (BMI) 31.0-31.9, adult 12/13/2021   Cataracts, bilateral 06/06/2013   Chalazion of right upper eyelid 06/06/2014   Concussion    Current use of beta blocker 04/11/2016   DOE (dyspnea on exertion) 12/25/2022   Dyspnea on exertion 12/25/2021   Eczema 12/24/2022   Essential hypertension 02/20/2016   Eyelid lesion 06/06/2013   Falls    Fatigue 12/24/2022   Gastroesophageal reflux disease without esophagitis 04/11/2016   Head injury    Herpes    IDA (iron  deficiency anemia) 01/11/2020   Inflammatory polyarthropathy (HCC) 12/24/2022   Iron  deficiency anemia due to chronic blood loss 12/28/2019   Joint pain 12/24/2022   Lymphedema 12/24/2022   Migraines    Moderate persistent asthma without complication 05/13/2016   Nerve damage    Nuclear sclerosis of both eyes 06/06/2014   Obesity  (BMI 30.0-34.9) 12/25/2021   Osteoarthritis 12/07/2019   Osteopenia 08/12/2022   Other allergic rhinitis 04/11/2016   Positive FIT (fecal immunochemical test) 12/28/2019   Psoriasis 12/07/2019   Psoriatic arthritis (HCC) 12/07/2019   Managed by rheumatology   PVD (posterior vitreous detachment), both eyes 03/19/2014   Restrictive lung disease 05/13/2016   Related to scoliosis and hiatal hernia   Retinal tear of right eye 02/13/2014   Shingles    Sjogren syndrome, unspecified (HCC) 12/24/2022   TIA (transient ischemic attack) 05/18/2019   Past Surgical History:  Procedure Laterality Date   BIOPSY  03/12/2023   Procedure: BIOPSY;  Surgeon: Elicia Claw, MD;  Location: [facility];  Service: Gastroenterology;;   BRAIN SURGERY     CRANIOTOMY FOR EXTRACRANIAL-INTRACRANIAL BYPASS     ESOPHAGOGASTRODUODENOSCOPY (EGD) WITH PROPOFOL  N/A 03/12/2023   Procedure: ESOPHAGOGASTRODUODENOSCOPY (EGD) WITH PROPOFOL ;  Surgeon: Elicia Claw, MD;  Location: [facility];  Service: Gastroenterology;  Laterality: N/A;   EYE SURGERY  4.4.17   milia excision   HIP SURGERY     intracranial surgery   NERVE SURGERY     ROOT CANAL     TONSILLECTOMY     Patient Active Problem List   Diagnosis Date Noted   Hyponatremia 03/10/2023   Upper GI bleed 03/06/2023   Mixed dyslipidemia 01/28/2023   Aortic atherosclerosis (HCC) 01/21/2023   DOE (dyspnea on  exertion) 12/25/2022   Eczema 12/24/2022   Fatigue 12/24/2022   Inflammatory polyarthropathy (HCC) 12/24/2022   Joint pain 12/24/2022   Lymphedema 12/24/2022   Sjogren syndrome, unspecified (HCC) 12/24/2022   Osteopenia 08/12/2022   Dyspnea on exertion 12/25/2021   Obesity (BMI 30.0-34.9) 12/25/2021   Allergy 12/13/2021   Concussion 12/13/2021   Falls 12/13/2021   Head injury 12/13/2021   Herpes 12/13/2021   Migraines 12/13/2021   Nerve damage 12/13/2021   Shingles 12/13/2021   Body mass index (BMI) 31.0-31.9, adult 12/13/2021   IDA (iron   deficiency anemia) 01/11/2020   Positive FIT (fecal immunochemical test) 12/28/2019   Iron  deficiency anemia due to chronic blood loss 12/28/2019   Psoriatic arthritis (HCC) 12/07/2019   Psoriasis 12/07/2019   Osteoarthritis 12/07/2019   TIA (transient ischemic attack) 05/18/2019   Restrictive lung disease 05/13/2016   Moderate persistent asthma without complication 05/13/2016   Current use of beta blocker 04/11/2016   Other allergic rhinitis 04/11/2016   Gastroesophageal reflux disease without esophagitis 04/11/2016   Essential hypertension 02/20/2016   Chalazion of right upper eyelid 06/06/2014   Nuclear sclerosis of both eyes 06/06/2014   PVD (posterior vitreous detachment), both eyes 03/19/2014   Retinal tear of right eye 02/13/2014   Abdominal pain, acute, right upper quadrant 07/29/2013   Cataracts, bilateral 06/06/2013   Eyelid lesion 06/06/2013    PCP: Copland, [name], MD   REFERRING PROVIDER: M70.61 (ICD-10-CM) - Trochanteric bursitis, right hip   REFERRING DIAG: M70.61 (ICD-10-CM) - Trochanteric bursitis, right hip   THERAPY DIAG:  Pain in right hip  Other abnormalities of gait and mobility  Muscle weakness (generalized)  Rationale for Evaluation and Treatment: Rehabilitation  ONSET DATE: chronic with exacerbation in February 2025   SUBJECTIVE:   SUBJECTIVE STATEMENT: Patient reports the hip is still giving her a fit. She is going to see another physician who specializes in the hip. She uses the crutches when she needs to. She saw a hand surgeon for LUE issues and is going to have surgery, but just doesn't know when.   EVAL: Patient reports having a hip replacement about 10 years ago and calls it a botched job. She was on pain medication for 7 years due to horrible pain following this surgery. Patient was able to wean from pain medication and eventually got used to the pain. The pain stopped a few years ago, but about 6 weeks ago she started having hip pain  again. She does not recall a MOI to cause an exacerbation of pain. She has to heat her hip and take advil in the morning, which is helping. This is what the doctor had told her to do.   PERTINENT HISTORY: Patient reports recent blood transfusion that is causing fatigue- iron  deficiency anemia chronic blood loss (per patient she is not making enough blood and is followed by hematology)  DOE TIA Rt THA >10 years ago  Osteopenia  PAIN:  Are you having pain? Yes: NPRS scale: none currently; 10 at worst Pain location: Rt lateral hip Pain description: throbbing  Aggravating factors: walking, stair negotiation, standing, NuStep  Relieving factors: heat, advil   PRECAUTIONS: Other: mindful of recent transfusion   RED FLAGS: None   WEIGHT BEARING RESTRICTIONS: No  FALLS:  Has patient fallen in last 6 months? No  LIVING ENVIRONMENT: Lives with: lives with their spouse Lives in: House/apartment Stairs: Yes: Internal: flight steps; on left going up Has following equipment at home: Vannie - 2 wheeled and Crutches  OCCUPATION: retired   PLOF: Independent  PATIENT GOALS: I want to get to walking.  NEXT MD VISIT: return if pain continues   OBJECTIVE:  Note: Objective measures were completed at Evaluation unless otherwise noted.  DIAGNOSTIC FINDINGS:  No recent imaging on file  PATIENT SURVEYS:  Patient-specific activity scoring scheme (Point to one number):  0 represents unable to perform. 10 represents able to perform at prior level. 0 1 2 3 4 5 6 7 8 9  10 (Date and Score) Activity Initial  Activity Eval     Walking   4    Stair negotiation   4         Additional Additional Total score = sum of the activity scores/number of activities Minimum detectable change (90%CI) for average score = 2 points Minimum detectable change (90%CI) for single activity score = 3 points PSFS developed by: Rosalee MYRTIS Marvis KYM Charlet CHRISTELLA., & Binkley, J. (1995). Assessing  disability and change on individual  patients: a report of a patient specific measure. [employer] [place], 47, 741-736. Reproduced with the permission of the authors  Score: 4    COGNITION: Overall cognitive status: Within functional limits for tasks assessed     SENSATION: WFL    MUSCLE LENGTH: Hamstrings: Right lacking 42 deg; Left lacking 40 deg  05/27/23: HS 90/90: Lt: lacking 30 deg; Rt lacking 32 deg   POSTURE: increased thoracic kyphosis and knees flexed in standing   PALPATION: TTP Rt greater trochanter   LOWER EXTREMITY ROM:  Passive ROM Right eval Left eval  Hip flexion 90   Hip extension    Hip abduction    Hip adduction    Hip internal rotation WFL   Hip external rotation [facility]   Knee flexion    Knee extension    Ankle dorsiflexion    Ankle plantarflexion    Ankle inversion    Ankle eversion     (Blank rows = not tested)  LOWER EXTREMITY MMT:  MMT Right eval Left eval  Hip flexion 4- 4-  Hip extension    Hip abduction 4- 4-  Hip adduction    Hip internal rotation    Hip external rotation    Knee flexion 4 4  Knee extension 4 4  Ankle dorsiflexion    Ankle plantarflexion    Ankle inversion    Ankle eversion     (Blank rows = not tested)   FUNCTIONAL TESTS:  30 seconds chair stand test: 7 reps   GAIT: Distance walked: 10 ft  Assistive device utilized: None Level of assistance: Complete Independence Comments: short step length, limited trunk rotation, forward flexed posture   [facility] Adult PT Treatment:                                                DATE: 05/27/23 Therapeutic Exercise: SLR 2 x 5 RLE, x 10 LLE  Sidelying hip abduction 2 x 10  Hip bridge with adduction ball squeeze 2 x 10  HS stretch with strap 2 x 20 sec  IT band stretch with strap 2 x 20 sec  Updated HEP    [facility] Adult PT Treatment:  DATE: 05/19/23 Therapeutic Exercise: SpO2: 97%, HR 60 (checked due to recent transfusion  secondary to anemia)  SKTC x 10 each; 5 sec hold  Seated hip abduction blue band 2 x 10  Seated march 2 x 10 blue band  LTR x 1 minute  Hip bridge 2 x 10  Hooklying adduction ball squeee 2 x 10; 5 sec hold  LAQ 2 x 10 @ 1.5 lbs                                                                                                                             [facility] Adult PT Treatment:                                                DATE: 05/12/23 Therapeutic Exercise: Demonstrated,performed, and issued initial HEP.       PATIENT EDUCATION:  Education details: HEP update  Person educated: Patient Education method: Explanation, Demonstration, Tactile cues, and Verbal cues, handout  Education comprehension: verbalized understanding, returned demonstration, verbal cues required, tactile cues required, and needs further education  HOME EXERCISE PROGRAM: Access Code: [email or web address] URL: [email or web address] Date: 05/27/2023 Prepared by: Lucie Meeter  Exercises - Hooklying Single Knee to Chest Stretch with Towel  - 2 x daily - 7 x weekly - 1 sets - 10 reps - 5 sec  hold - Seated Hamstring Stretch  - 2 x daily - 7 x weekly - 3 sets - 30 sec  hold - Seated March with Resistance  - 1 x daily - 7 x weekly - 2 sets - 10 reps - Sidelying Hip Abduction  - 1 x daily - 7 x weekly - 2 sets - 10 reps - Supine Bridge with Mini Swiss Ball Between Knees  - 1 x daily - 7 x weekly - 2 sets - 10 reps  ASSESSMENT:  CLINICAL IMPRESSION: Session somewhat limited as patient was late for scheduled visit. She arrives without pain, but continues to report intermittent high pain levels. With SLR she reported pain after a few reps on the anterolateral Rt thigh, but when asked to describe further she reports that it feels like pulling and burning. It is unclear if patient's description is an increase in pain or muscle fatigue, regardless SLR was discontinued after minimal reps with each set on the RLE. HS  flexibility has improved bilaterally, having met this STG.   EVAL: Patient is a 83 y.o. female who was seen today for physical therapy evaluation and treatment for acute on chronic Rt hip pain with most recent exacerbation occurring 6 weeks ago of insidious onset. She has history of THA > 10 years ago and reports chronic pain associated with this procedure. Upon assessment she is noted to have palpable tenderness about greater trochanter, limited hip flexion ROM, hamstring tightness, hip/knee weakness, and gait abnormalities.  In addition she is currently being treated by hematology with recent infusion due to iron  deficiency anemia due to chronic blood loss, which might interfere with her overall tolerance to PT. Patient will benefit from skilled PT to address the above stated deficits in order to optimize her function and assist in overall pain reduction.   OBJECTIVE IMPAIRMENTS: Abnormal gait, decreased activity tolerance, decreased endurance, decreased knowledge of condition, decreased mobility, difficulty walking, decreased ROM, decreased strength, increased fascial restrictions, impaired flexibility, improper body mechanics, postural dysfunction, and pain.   ACTIVITY LIMITATIONS: carrying, lifting, standing, squatting, stairs, transfers, and locomotion level  PARTICIPATION LIMITATIONS: cleaning, driving, shopping, and community activity  PERSONAL FACTORS: Age, Fitness, Time since onset of injury/illness/exacerbation, and 3+ comorbidities: see PMH above are also affecting patient's functional outcome.   REHAB POTENTIAL: Fair see personal factors  CLINICAL DECISION MAKING: Evolving/moderate complexity  EVALUATION COMPLEXITY: Moderate   GOALS: Goals reviewed with patient? Yes  SHORT TERM GOALS: Target date: 06/09/2023   Patient will be independent and compliant with initial HEP.   Baseline: issued at eval  Goal status: MET  2.  Patient will improve hamstring flexibility by at least 10  degrees to reduce stress on the hip.  Baseline: see above  Goal status: MET  3.  Patient will complete at least 10 reps during 30 second sit to stand test to improve functional strength.  Baseline: 7  Goal status: INITIAL   LONG TERM GOALS: Target date: 07/11/23  Patient will demonstrate at least 4+/5 bilateral hip strength to improve stability about the chain with prolonged walking activity.  Baseline: see above Goal status: INITIAL  2.  Patient will demonstrate 5/5 bilateral knee strength to improve stability with stair negotiation.  Baseline: see above Goal status: INITIAL  3.  Patient will score at least a 6 on the PSFS to signify clinically meaningful improvement in functional abilities.  Baseline: 4 Goal status: INITIAL  4.  Patient will report pain at worst rated as </= 5/10 to improve ability to participate in community activity Baseline: is limited in church and other community activity due to 10/10 pain Goal status: INITIAL  5.  Patient will be independent with advanced home program to progress/maintain current level of function.  Baseline: initial HEP issued  Goal status: INITIAL    PLAN:  PT FREQUENCY: 1-2x/week (will start with 1 x week to determine tolerance to PT)  PT DURATION: 8 weeks  PLANNED INTERVENTIONS: 97164- PT Re-evaluation, 97110-Therapeutic exercises, 97530- Therapeutic activity, 97112- Neuromuscular re-education, 97535- Self Care, [id number]- Manual therapy, 97116- Gait training, Taping, Dry Needling, Cryotherapy, and Moist heat  PLAN FOR NEXT SESSION: review and progress HEP prn; begin with mat based strengthening due to decreased activity tolerance due to other medical conditions. Hamstring stretching, IT band stretching.    Terilyn Sano, PT, DPT, ATC 05/27/23 2:48 PM  Lucie Meeter, PT, DPT, ATC 02/25/2024 9:44 AM  "

## 2023-05-28 ENCOUNTER — Other Ambulatory Visit: Payer: Self-pay | Admitting: Family Medicine

## 2023-05-28 DIAGNOSIS — I10 Essential (primary) hypertension: Secondary | ICD-10-CM

## 2023-05-29 DIAGNOSIS — M25551 Pain in right hip: Secondary | ICD-10-CM | POA: Diagnosis not present

## 2023-05-29 DIAGNOSIS — M7061 Trochanteric bursitis, right hip: Secondary | ICD-10-CM | POA: Diagnosis not present

## 2023-06-02 DIAGNOSIS — M4150 Other secondary scoliosis, site unspecified: Secondary | ICD-10-CM | POA: Diagnosis not present

## 2023-06-02 DIAGNOSIS — Z96649 Presence of unspecified artificial hip joint: Secondary | ICD-10-CM | POA: Diagnosis not present

## 2023-06-02 DIAGNOSIS — M978XXA Periprosthetic fracture around other internal prosthetic joint, initial encounter: Secondary | ICD-10-CM | POA: Diagnosis not present

## 2023-06-02 DIAGNOSIS — M25551 Pain in right hip: Secondary | ICD-10-CM | POA: Diagnosis not present

## 2023-06-02 DIAGNOSIS — M5416 Radiculopathy, lumbar region: Secondary | ICD-10-CM | POA: Diagnosis not present

## 2023-06-02 DIAGNOSIS — M898X5 Other specified disorders of bone, thigh: Secondary | ICD-10-CM | POA: Diagnosis not present

## 2023-06-03 ENCOUNTER — Ambulatory Visit

## 2023-06-03 DIAGNOSIS — M25551 Pain in right hip: Secondary | ICD-10-CM | POA: Diagnosis not present

## 2023-06-03 DIAGNOSIS — M1711 Unilateral primary osteoarthritis, right knee: Secondary | ICD-10-CM | POA: Diagnosis not present

## 2023-06-03 DIAGNOSIS — Z96641 Presence of right artificial hip joint: Secondary | ICD-10-CM | POA: Diagnosis not present

## 2023-06-10 ENCOUNTER — Encounter

## 2023-06-15 ENCOUNTER — Other Ambulatory Visit: Payer: Self-pay | Admitting: Family Medicine

## 2023-06-15 DIAGNOSIS — I10 Essential (primary) hypertension: Secondary | ICD-10-CM

## 2023-06-16 ENCOUNTER — Other Ambulatory Visit: Payer: Self-pay | Admitting: Family Medicine

## 2023-06-16 ENCOUNTER — Other Ambulatory Visit: Payer: Self-pay

## 2023-06-16 NOTE — Telephone Encounter (Signed)
 While attempting to refill this pts Rx I realized that she was on Atenolol  before hospitalization.   Can you verify which Rx she is to be taking?

## 2023-06-16 NOTE — Telephone Encounter (Signed)
 Copied from CRM [id number]. Topic: Clinical - Medication Refill >> Jun 16, 2023 10:18 AM Marlan Silva wrote: Most Recent Primary Care Visit:  Provider: Kaylee Partridge  Department: [facility]  Visit Type: OFFICE VISIT  Date: 04/01/2023  Medication: NEBIVOLOL  10MG   Has the patient contacted their pharmacy? Yes (Agent: If no, request that the patient contact the pharmacy for the refill. If patient does not wish to contact the pharmacy document the reason why and proceed with request.) (Agent: If yes, when and what did the pharmacy advise?)  Is this the correct pharmacy for this prescription? Yes If no, delete pharmacy and type the correct one.  This is the patient's preferred pharmacy:  [id number] - [place], [place] - [address] [place] [address] Phone: [phone] Fax: [phone]   Has the prescription been filled recently? No  Is the patient out of the medication? Yes  Has the patient been seen for an appointment in the last year OR does the patient have an upcoming appointment? Yes  Can we respond through MyChart? Yes  Agent: Please be advised that Rx refills may take up to 3 business days. We ask that you follow-up with your pharmacy.

## 2023-06-17 MED ORDER — NEBIVOLOL HCL 10 MG PO TABS: 10.0000 mg | ORAL_TABLET | Freq: Every day | ORAL | 1 refills | Status: DC

## 2023-06-24 ENCOUNTER — Ambulatory Visit (HOSPITAL_BASED_OUTPATIENT_CLINIC_OR_DEPARTMENT_OTHER): Payer: Medicare Other

## 2023-06-24 ENCOUNTER — Ambulatory Visit (HOSPITAL_BASED_OUTPATIENT_CLINIC_OR_DEPARTMENT_OTHER)
Admission: RE | Admit: 2023-06-24 | Discharge: 2023-06-24 | Disposition: A | Discharge status: Home / Self Care | Source: Ambulatory Visit | Attending: Family Medicine | Admitting: Family Medicine

## 2023-06-24 DIAGNOSIS — R9389 Abnormal findings on diagnostic imaging of other specified body structures: Secondary | ICD-10-CM | POA: Diagnosis not present

## 2023-06-24 DIAGNOSIS — I700 Atherosclerosis of aorta: Secondary | ICD-10-CM | POA: Diagnosis not present

## 2023-06-24 DIAGNOSIS — K449 Diaphragmatic hernia without obstruction or gangrene: Secondary | ICD-10-CM | POA: Diagnosis not present

## 2023-06-24 DIAGNOSIS — R918 Other nonspecific abnormal finding of lung field: Secondary | ICD-10-CM | POA: Diagnosis not present

## 2023-06-24 DIAGNOSIS — J9811 Atelectasis: Secondary | ICD-10-CM | POA: Diagnosis not present

## 2023-07-05 ENCOUNTER — Encounter: Payer: Self-pay | Admitting: Family Medicine

## 2023-07-06 ENCOUNTER — Other Ambulatory Visit: Payer: Self-pay | Admitting: Family Medicine

## 2023-07-06 ENCOUNTER — Telehealth: Payer: Self-pay | Admitting: Neurology

## 2023-07-06 ENCOUNTER — Encounter: Payer: Self-pay | Admitting: Family Medicine

## 2023-07-06 DIAGNOSIS — N6459 Other signs and symptoms in breast: Secondary | ICD-10-CM

## 2023-07-06 DIAGNOSIS — N63 Unspecified lump in unspecified breast: Secondary | ICD-10-CM

## 2023-07-06 DIAGNOSIS — G5622 Lesion of ulnar nerve, left upper limb: Secondary | ICD-10-CM | POA: Diagnosis not present

## 2023-07-06 DIAGNOSIS — M79642 Pain in left hand: Secondary | ICD-10-CM | POA: Diagnosis not present

## 2023-07-06 DIAGNOSIS — N6310 Unspecified lump in the right breast, unspecified quadrant: Secondary | ICD-10-CM

## 2023-07-06 NOTE — Telephone Encounter (Signed)
 Copied from CRM [id number]. Topic: Clinical - Lab/Test Results >> Jul 06, 2023  9:57 AM Lovett Ruck C wrote: Reason for CRM: Diane from [facility] calling, patient had a CT of the Chest on 06/24/2023 and results are now in patient's chart, wanted to make sure doctor was aware.

## 2023-07-08 ENCOUNTER — Telehealth: Payer: Self-pay | Admitting: Family Medicine

## 2023-07-08 DIAGNOSIS — N6310 Unspecified lump in the right breast, unspecified quadrant: Secondary | ICD-10-CM

## 2023-07-08 DIAGNOSIS — N6459 Other signs and symptoms in breast: Secondary | ICD-10-CM

## 2023-07-08 NOTE — Telephone Encounter (Signed)
 Copied from CRM [id number]. Topic: General - Other >> Jul 08, 2023  2:28 PM Deaijah H wrote: Reason for CRM: Patients husband would like to know if Dr. Geralyn Knee could send previous information from last imaging center due to the next imaging center asking for information. If needed, please call [phone]

## 2023-07-24 ENCOUNTER — Other Ambulatory Visit: Payer: Self-pay | Admitting: Family Medicine

## 2023-07-30 DIAGNOSIS — H40003 Preglaucoma, unspecified, bilateral: Secondary | ICD-10-CM | POA: Diagnosis not present

## 2023-07-30 DIAGNOSIS — H40043 Steroid responder, bilateral: Secondary | ICD-10-CM | POA: Diagnosis not present

## 2023-07-30 DIAGNOSIS — H0011 Chalazion right upper eyelid: Secondary | ICD-10-CM | POA: Diagnosis not present

## 2023-07-30 DIAGNOSIS — H0015 Chalazion left lower eyelid: Secondary | ICD-10-CM | POA: Diagnosis not present

## 2023-07-30 DIAGNOSIS — H2513 Age-related nuclear cataract, bilateral: Secondary | ICD-10-CM | POA: Diagnosis not present

## 2023-08-04 ENCOUNTER — Ambulatory Visit
Admission: RE | Admit: 2023-08-04 | Discharge: 2023-08-04 | Disposition: A | Discharge status: Home / Self Care | Source: Ambulatory Visit | Attending: Family Medicine | Admitting: Family Medicine

## 2023-08-04 ENCOUNTER — Other Ambulatory Visit: Payer: Self-pay | Admitting: Family Medicine

## 2023-08-04 DIAGNOSIS — N6459 Other signs and symptoms in breast: Secondary | ICD-10-CM

## 2023-08-04 DIAGNOSIS — N6310 Unspecified lump in the right breast, unspecified quadrant: Secondary | ICD-10-CM

## 2023-08-04 DIAGNOSIS — N6314 Unspecified lump in the right breast, lower inner quadrant: Secondary | ICD-10-CM | POA: Diagnosis not present

## 2023-08-04 DIAGNOSIS — R928 Other abnormal and inconclusive findings on diagnostic imaging of breast: Secondary | ICD-10-CM

## 2023-08-05 ENCOUNTER — Telehealth: Payer: Self-pay | Admitting: Family Medicine

## 2023-08-05 NOTE — Telephone Encounter (Signed)
 Copied from CRM [id number]. Topic: Appointments - Scheduling Inquiry for Clinic >> Aug 05, 2023  2:45 PM [place] J wrote: Reason for CRM: Patient was wondering if she could see Dr. Geralyn Knee sometime this week to discuss a biopsy she's supposed to have done. She's a bit nervous about the procedure and does not want to move forward with it until speaking with Dr. Geralyn Knee. Until then, I was able to schedule the patient for next Thursday.

## 2023-08-05 NOTE — Telephone Encounter (Signed)
 Spoke w/ Caretha Chapel- Pt's husband, informed that PCP is on vacation this week, we did try to see if PCP had anything earlier for next week but she currently doesn't. Pt will keep appt as scheduled.

## 2023-08-10 NOTE — Progress Notes (Addendum)
 [facility] [address], [address] [place], [place] [address] [phone]  Date:  08/13/2023   Name:  Angela Gibbs   DOB:  1940/10/06   MRN:  [id number]  PCP:  Watt [name], MD    Chief Complaint: Follow-up (Pt has been told the spot is in her R breast, previously she was told it was in the L breast )   History of Present Illness:  Angela Gibbs is a 83 y.o. very pleasant female patient who presents with the following:  Patient seen today with a concern regarding a possible breast biopsy.  Most recent visit with myself was in February  history of TIA, restrictive lung disease due to scoliosis and hiatal hernia, psoriatic arthritis, hypertension, iron  deficiency anemia, hypothyroidism, GI bleed in January of this year  She also got COVID earlier this spring.  She had a CT of her chest ([facility], January 2025) which was concerning, a follow-up CT chest in May noted this breast abnormality and she underwent further breast imaging IMPRESSION: 1. There are scattered pulmonary nodules measuring up to 3 mm, unchanged since 2021 and consistent with a benign etiology. 2. There is a 6 mm possible mass of the RIGHT lower inner breast, new since 2021. This may correspond to the asymmetry for which biopsy was recommended in 2023; it is unclear whether this has been performed. Recommend dedicated bilateral diagnostic mammogram and targeted ultrasound as deemed necessary if not recently performed. 3. Large hiatal hernia.   Diagnostic mammogram and ultrasound performed August 04, 2023 showed a concerning mass in the right breast FINDINGS: Cc and MLO views of bilateral breasts are submitted. There is an irregular mass in the lower-inner quadrant right breast enlarged compared to prior mammogram of 2023 and correlates to the chest CT finding. The left breast is negative. Targeted ultrasound is performed, showing spiculated mass at  right 4 o'clock 2 cm from nipple measuring 0.9 x 0.8 x 0.8 cm correlating to the mammographic mass. Ultrasound right axilla is negative. IMPRESSION: Highly suspicious findings. RECOMMENDATION: Recommend ultrasound-guided core biopsy right breast mass.  We discussed her breast imaging results.   Pt notes there was a concern about her LEFT breast back in 2023 Patient Active Problem List   Diagnosis Date Noted   Hyponatremia 03/10/2023   Upper GI bleed 03/06/2023   Mixed dyslipidemia 01/28/2023   Aortic atherosclerosis (HCC) 01/21/2023   DOE (dyspnea on exertion) 12/25/2022   Eczema 12/24/2022   Fatigue 12/24/2022   Inflammatory polyarthropathy (HCC) 12/24/2022   Joint pain 12/24/2022   Lymphedema 12/24/2022   Sjogren syndrome, unspecified (HCC) 12/24/2022   Osteopenia 08/12/2022   Dyspnea on exertion 12/25/2021   Obesity (BMI 30.0-34.9) 12/25/2021   Allergy 12/13/2021   Concussion 12/13/2021   Falls 12/13/2021   Head injury 12/13/2021   Herpes 12/13/2021   Migraines 12/13/2021   Nerve damage 12/13/2021   Shingles 12/13/2021   Body mass index (BMI) 31.0-31.9, adult 12/13/2021   IDA (iron  deficiency anemia) 01/11/2020   Positive FIT (fecal immunochemical test) 12/28/2019   Iron  deficiency anemia due to chronic blood loss 12/28/2019   Psoriatic arthritis (HCC) 12/07/2019   Psoriasis 12/07/2019   Osteoarthritis 12/07/2019   TIA (transient ischemic attack) 05/18/2019   Restrictive lung disease 05/13/2016   Moderate persistent asthma without complication 05/13/2016   Current use of beta blocker 04/11/2016   Other allergic rhinitis 04/11/2016   Gastroesophageal reflux disease without esophagitis 04/11/2016  Essential hypertension 02/20/2016   Chalazion of right upper eyelid 06/06/2014   Nuclear sclerosis of both eyes 06/06/2014   PVD (posterior vitreous detachment), both eyes 03/19/2014   Retinal tear of right eye 02/13/2014   Abdominal pain, acute, right upper quadrant  07/29/2013   Cataracts, bilateral 06/06/2013   Eyelid lesion 06/06/2013    Past Medical History:  Diagnosis Date   Abdominal pain, acute, right upper quadrant 07/29/2013   Allergy    Aortic atherosclerosis (HCC) 01/21/2023   Body mass index (BMI) 31.0-31.9, adult 12/13/2021   Cataracts, bilateral 06/06/2013   Chalazion of right upper eyelid 06/06/2014   Concussion    Current use of beta blocker 04/11/2016   DOE (dyspnea on exertion) 12/25/2022   Dyspnea on exertion 12/25/2021   Eczema 12/24/2022   Essential hypertension 02/20/2016   Eyelid lesion 06/06/2013   Falls    Fatigue 12/24/2022   Gastroesophageal reflux disease without esophagitis 04/11/2016   Head injury    Herpes    IDA (iron  deficiency anemia) 01/11/2020   Inflammatory polyarthropathy (HCC) 12/24/2022   Iron  deficiency anemia due to chronic blood loss 12/28/2019   Joint pain 12/24/2022   Lymphedema 12/24/2022   Migraines    Moderate persistent asthma without complication 05/13/2016   Nerve damage    Nuclear sclerosis of both eyes 06/06/2014   Obesity (BMI 30.0-34.9) 12/25/2021   Osteoarthritis 12/07/2019   Osteopenia 08/12/2022   Other allergic rhinitis 04/11/2016   Positive FIT (fecal immunochemical test) 12/28/2019   Psoriasis 12/07/2019   Psoriatic arthritis (HCC) 12/07/2019   Managed by rheumatology   PVD (posterior vitreous detachment), both eyes 03/19/2014   Restrictive lung disease 05/13/2016   Related to scoliosis and hiatal hernia   Retinal tear of right eye 02/13/2014   Shingles    Sjogren syndrome, unspecified (HCC) 12/24/2022   TIA (transient ischemic attack) 05/18/2019    Past Surgical History:  Procedure Laterality Date   BIOPSY  03/12/2023   Procedure: BIOPSY;  Surgeon: Elicia Claw, MD;  Location: [facility];  Service: Gastroenterology;;   BRAIN SURGERY     CRANIOTOMY FOR EXTRACRANIAL-INTRACRANIAL BYPASS     ESOPHAGOGASTRODUODENOSCOPY (EGD) WITH PROPOFOL  N/A 03/12/2023    Procedure: ESOPHAGOGASTRODUODENOSCOPY (EGD) WITH PROPOFOL ;  Surgeon: Elicia Claw, MD;  Location: [facility];  Service: Gastroenterology;  Laterality: N/A;   EYE SURGERY  4.4.17   milia excision   HIP SURGERY     intracranial surgery   NERVE SURGERY     ROOT CANAL     TONSILLECTOMY      Social History   Tobacco Use   Smoking status: Never   Smokeless tobacco: Never  Vaping Use   Vaping status: Never Used  Substance Use Topics   Alcohol use: Yes    Comment: social   Drug use: No    Family History  Problem Relation Age of Onset   Heart disease Father    Heart attack Father     Allergies  Allergen Reactions   Cephalosporins Shortness Of Breath and Other (See Comments)    Breathing difficulty   Ciprofloxacin Hives and Shortness Of Breath   Influenza Virus Vaccine Other (See Comments)    Numbness in legs   Keflex  [Cephalexin ] Hives   Latex Hives, Itching and Rash   Levaquin [Levofloxacin] Hives, Palpitations and Other (See Comments)    Chest pain   Nitrofuran Derivatives Hives, Itching and Swelling   Penicillins Hives   Sulfacetamide Sodium Hives   Tape Itching and Other (See Comments)  RED AND IRRITATED SKIN   Xopenex  [Levalbuterol  Hcl] Anxiety and Other (See Comments)    Tremors with violent body chattering and shaking   Zostavax [Zoster Vaccine Live] Other (See Comments)    Pt has been told by specialists not to receive vaccine d/t h/o herpes in bloodstream and shingles.   Erythromycin Other (See Comments)    stomach issues   Hydrochlorothiazide  Other (See Comments)    Migraine   Lisinopril Cough   Petrolatum Nausea Only   Pregabalin Other (See Comments)   Ropinirole  Hcl Nausea And Vomiting   Shellfish Allergy Hives   Sulfa Antibiotics Hives   Golimumab  Other (See Comments)    Felt poorly (Simponi )   Ibuprofen Other (See Comments)    Stomach upset   Lactose Other (See Comments)    stomach issues  Other Reaction(s): GI Intolerance    Aspirin Other (See Comments)    Stomach pain/burning   Avelox [Moxifloxacin Hcl In Nacl] Nausea Only   Cefdinir Nausea And Vomiting   Chromium Other (See Comments)    Blisters   Egg Solids, Whole Other (See Comments)    Per allergy testing, no known reaction. Pt states she was told she could probably eat 2-3 eggs weekly w/o issue.   Egg-Derived Products     Per allergy testing, no known reaction. Pt states she was told she could probably eat 2-3 eggs weekly w/o issue.   Iodinated Contrast Media Other (See Comments)    unknown    Lactose Intolerance (Gi) Other (See Comments)    stomach issues   Percodan [Oxycodone -Aspirin] Nausea And Vomiting   Prednisone  Palpitations    No injections, can do the pills   Troleandomycin Other (See Comments)    Unknown reaction    Medication list has been reviewed and updated.  Current Outpatient Medications on File Prior to Visit  Medication Sig Dispense Refill   ALPRAZolam  (XANAX ) 0.25 MG tablet TAKE 1 TABLET(0.25 MG) BY MOUTH TWICE DAILY AS NEEDED FOR ANXIETY (Patient taking differently: Take 0.25 mg by mouth 2 (two) times daily as needed for anxiety.) 30 tablet 0   Ascorbic Acid (VITAMIN C) 1000 MG tablet Take 1,000 mg by mouth daily.     butalbital-acetaminophen -caffeine (FIORICET) 50-325-40 MG tablet Take 1-2 tablets by mouth 2 (two) times daily as needed for headache (or leg pain).     Cholecalciferol (VITAMIN D3) 125 MCG (5000 UT) CAPS Take 5,000 Units by mouth daily.     FLUoxetine  (PROZAC ) 20 MG capsule Take 1 capsule (20 mg total) by mouth daily. 90 capsule 1   ibuprofen (ADVIL) 200 MG tablet Take 200 mg by mouth every 6 (six) hours as needed.     MAGNESIUM GLYCINATE PO Take 50 mg by mouth daily.     montelukast  (SINGULAIR ) 10 MG tablet TAKE 1 TABLET(10 MG) BY MOUTH AT BEDTIME (Patient taking differently: Take 10 mg by mouth at bedtime as needed (for wheezing).) 30 tablet 2   nebivolol  (BYSTOLIC ) 10 MG tablet Take 1 tablet (10 mg total) by  mouth daily. 90 tablet 1   NP THYROID  30 MG tablet Take 30 mg by mouth daily before breakfast.     pantoprazole  (PROTONIX ) 40 MG tablet Take 1 tablet (40 mg total) by mouth daily. 30 tablet 1   Prednicarbate 0.1 % CREA Apply 1 Application topically as needed for itching (affected sites).     REFRESH TEARS PF 0.5-0.9 % SOLN Place 1 drop into both eyes 3 (three) times daily as needed (for irritation).  sodium chloride  (V-R NASAL SPRAY SALINE) 0.65 % nasal spray Place 1 spray into the nose as needed. Use in both nostrils twice daily (Patient taking differently: Place 1 spray into the nose as needed for congestion.) 30 mL 6   spironolactone  (ALDACTONE ) 25 MG tablet TAKE 1/2 TABLET(12.5 MG) BY MOUTH DAILY (Patient taking differently: Take 12.5 mg by mouth daily.) 30 tablet 3   UNKNOWN TO PATIENT Take 1 tablet by mouth See admin instructions. Unnamed OTC for restless legs: Take 1 tablet by mouth every other night     valsartan  (DIOVAN ) 320 MG tablet TAKE 1 TABLET(320 MG) BY MOUTH DAILY (Patient taking differently: Take 320 mg by mouth in the morning.) 90 tablet 3   atorvastatin  (LIPITOR) 10 MG tablet Take 1 tablet (10 mg total) by mouth daily. 90 tablet 3   No current facility-administered medications on file prior to visit.    Review of Systems:  As per HPI- otherwise negative.   Physical Examination: Vitals:   08/13/23 1540  BP: 122/82  Pulse: (!) 52  SpO2: 98%   Vitals:   08/13/23 1540  Weight: 174 lb (78.9 kg)  Height: 5' 2 (1.575 m)   Body mass index is 31.83 kg/m. Ideal Body Weight: Weight in (lb) to have BMI = 25: 136.4  GEN: no acute distress.  Obese, looks well  HEENT: Atraumatic, Normocephalic.  Ears and Nose: No external deformity. CV: RRR, No M/G/R. No JVD. No thrill. No extra heart sounds. PULM: CTA B, no wheezes, crackles, rhonchi. No retractions. No resp. distress. No accessory muscle use. ABD: S, NT, ND, +BS. No rebound. No HSM. EXTR: No c/c/e PSYCH: Normally  interactive. Conversant.    Assessment and Plan: Acute blood loss anemia - Plan: CBC  Hyponatremia - Plan: Comprehensive metabolic panel with GFR Pt seen today with question about breast bx.  Despite her age Angela Gibbs is very active and would want to be treated if she does have breast cancer.  She plans to go ahead with the biopsy  Follow-up on labs today  Will plan further follow- up pending labs.   Signed Harlene Schroeder, MD  Received labs 6/27- message to pt  Results for orders placed or performed in visit on 08/13/23  CBC   Collection Time: 08/13/23  4:16 PM  Result Value Ref Range   WBC 5.3 4.0 - 10.5 K/uL   RBC 4.08 3.87 - 5.11 Mil/uL   Platelets 220.0 150.0 - 400.0 K/uL   Hemoglobin 12.7 12.0 - 15.0 g/dL   HCT 63.0 63.9 - 53.9 %   MCV 90.5 78.0 - 100.0 fl   MCHC 34.4 30.0 - 36.0 g/dL   RDW 86.9 88.4 - 84.4 %  Comprehensive metabolic panel with GFR   Collection Time: 08/13/23  4:16 PM  Result Value Ref Range   Sodium 132 (L) 135 - 145 mEq/L   Potassium 4.4 3.5 - 5.1 mEq/L   Chloride 98 96 - 112 mEq/L   CO2 29 19 - 32 mEq/L   Glucose, Bld 79 70 - 99 mg/dL   BUN 16 6 - 23 mg/dL   Creatinine, Ser 9.35 0.40 - 1.20 mg/dL   Total Bilirubin 0.6 0.2 - 1.2 mg/dL   Alkaline Phosphatase 71 39 - 117 U/L   AST 18 0 - 37 U/L   ALT 17 0 - 35 U/L   Total Protein 6.6 6.0 - 8.3 g/dL   Albumin 3.9 3.5 - 5.2 g/dL   GFR 18.00 >39.99 mL/min   Calcium   9.0 8.4 - 10.5 mg/dL

## 2023-08-12 DIAGNOSIS — H35363 Drusen (degenerative) of macula, bilateral: Secondary | ICD-10-CM | POA: Diagnosis not present

## 2023-08-12 DIAGNOSIS — H0011 Chalazion right upper eyelid: Secondary | ICD-10-CM | POA: Diagnosis not present

## 2023-08-12 DIAGNOSIS — D3191 Benign neoplasm of unspecified part of right eye: Secondary | ICD-10-CM | POA: Diagnosis not present

## 2023-08-12 DIAGNOSIS — H43813 Vitreous degeneration, bilateral: Secondary | ICD-10-CM | POA: Diagnosis not present

## 2023-08-12 DIAGNOSIS — H40003 Preglaucoma, unspecified, bilateral: Secondary | ICD-10-CM | POA: Diagnosis not present

## 2023-08-12 DIAGNOSIS — H40043 Steroid responder, bilateral: Secondary | ICD-10-CM | POA: Diagnosis not present

## 2023-08-12 DIAGNOSIS — H2513 Age-related nuclear cataract, bilateral: Secondary | ICD-10-CM | POA: Diagnosis not present

## 2023-08-13 ENCOUNTER — Encounter: Payer: Self-pay | Admitting: Family Medicine

## 2023-08-13 ENCOUNTER — Ambulatory Visit (INDEPENDENT_AMBULATORY_CARE_PROVIDER_SITE_OTHER): Admitting: Family Medicine

## 2023-08-13 VITALS — BP 122/82 | HR 52 | Ht 62.0 in | Wt 174.0 lb

## 2023-08-13 DIAGNOSIS — D62 Acute posthemorrhagic anemia: Secondary | ICD-10-CM | POA: Diagnosis not present

## 2023-08-13 DIAGNOSIS — E871 Hypo-osmolality and hyponatremia: Secondary | ICD-10-CM

## 2023-08-13 NOTE — Patient Instructions (Addendum)
 Please call back and let them know you would like to go ahead with the breast biopsy  I will be in touch with the labs asap

## 2023-08-14 ENCOUNTER — Encounter: Payer: Self-pay | Admitting: Family Medicine

## 2023-08-14 LAB — CBC
HCT: 36.9 % (ref 36.0–46.0)
Hemoglobin: 12.7 g/dL (ref 12.0–15.0)
MCHC: 34.4 g/dL (ref 30.0–36.0)
MCV: 90.5 fl (ref 78.0–100.0)
Platelets: 220 10*3/uL (ref 150.0–400.0)
RBC: 4.08 Mil/uL (ref 3.87–5.11)
RDW: 13 % (ref 11.5–15.5)
WBC: 5.3 10*3/uL (ref 4.0–10.5)

## 2023-08-14 LAB — COMPREHENSIVE METABOLIC PANEL WITH GFR
ALT: 17 U/L (ref 0–35)
AST: 18 U/L (ref 0–37)
Albumin: 3.9 g/dL (ref 3.5–5.2)
Alkaline Phosphatase: 71 U/L (ref 39–117)
BUN: 16 mg/dL (ref 6–23)
CO2: 29 meq/L (ref 19–32)
Calcium: 9 mg/dL (ref 8.4–10.5)
Chloride: 98 meq/L (ref 96–112)
Creatinine, Ser: 0.64 mg/dL (ref 0.40–1.20)
GFR: 81.99 mL/min (ref >60.00)
Glucose, Bld: 79 mg/dL (ref 70–99)
Potassium: 4.4 meq/L (ref 3.5–5.1)
Sodium: 132 meq/L — ABNORMAL LOW (ref 135–145)
Total Bilirubin: 0.6 mg/dL (ref 0.2–1.2)
Total Protein: 6.6 g/dL (ref 6.0–8.3)

## 2023-08-18 ENCOUNTER — Ambulatory Visit
Admission: RE | Admit: 2023-08-18 | Discharge: 2023-08-18 | Disposition: A | Discharge status: Home / Self Care | Source: Ambulatory Visit | Attending: Family Medicine | Admitting: Family Medicine

## 2023-08-18 DIAGNOSIS — N6314 Unspecified lump in the right breast, lower inner quadrant: Secondary | ICD-10-CM | POA: Diagnosis not present

## 2023-08-18 DIAGNOSIS — N6310 Unspecified lump in the right breast, unspecified quadrant: Secondary | ICD-10-CM

## 2023-08-18 DIAGNOSIS — R928 Other abnormal and inconclusive findings on diagnostic imaging of breast: Secondary | ICD-10-CM

## 2023-08-18 DIAGNOSIS — C50311 Malignant neoplasm of lower-inner quadrant of right female breast: Secondary | ICD-10-CM | POA: Diagnosis not present

## 2023-08-18 HISTORY — PX: BREAST BIOPSY: SHX20

## 2023-08-20 LAB — SURGICAL PATHOLOGY

## 2023-09-04 ENCOUNTER — Other Ambulatory Visit: Payer: Self-pay | Admitting: Surgery

## 2023-09-04 ENCOUNTER — Telehealth: Payer: Self-pay | Admitting: Radiation Oncology

## 2023-09-04 DIAGNOSIS — Z853 Personal history of malignant neoplasm of breast: Secondary | ICD-10-CM

## 2023-09-04 DIAGNOSIS — C50311 Malignant neoplasm of lower-inner quadrant of right female breast: Secondary | ICD-10-CM | POA: Diagnosis not present

## 2023-09-04 NOTE — Progress Notes (Signed)
 REFERRING PHYSICIAN:  Copland, Harlene Solan* PROVIDER:  VICENTA DASIE POLI, MD MRN: [id number] DOB: 1941/01/04 DATE OF ENCOUNTER: 09/04/2023 Subjective    Chief Complaint: New Consultation (Rt breast cancer)   History of Present Illness: Angela Gibbs is a 83 y.o. female who is seen today as an office consultation for evaluation of New Consultation (Rt breast cancer)   This is an 83 year old female who is accompanied by her husband.  She was recently diagnosed with a right breast cancer.  She was found to have a mass in her breast found on a CT scan for screening purposes.  On imaging, the mass measured 9 mm at the 4 o'clock position of the right breast 2 cm from the nipple on mammography.  Ultrasound the axilla was negative.  A biopsy of the mass showed an invasive ductal carcinoma.  She has had no previous problems regarding her breast.  Denies nipple discharge.  There is no family history of breast cancer.  She has had no previous problems regarding general anesthesia    Review of Systems: A complete review of systems was obtained from the patient.  I have reviewed this information and discussed as appropriate with the patient.  See HPI as well for other ROS.  ROS   Medical History: Past Medical History:  Diagnosis Date  . Anemia   . Anxiety   . Arthritis   . Asthma, unspecified asthma severity, unspecified whether complicated, unspecified whether persistent (HHS-HCC)   . GERD (gastroesophageal reflux disease)   . History of cancer   . Hyperlipidemia   . Hypertension   . Thyroid  disease     There is no problem list on file for this patient.   Past Surgical History:  Procedure Laterality Date  . Breast biopsy (Right)  08/18/2023  . CRANIOTOMY FOR EXTRACRANIAL-INTRACRANIAL BYPASS    . Hip surgery    . TONSILLECTOMY       Allergies  Allergen Reactions  . Adhesive Itching and Other (See Comments)    RED AND IRRITATED SKIN  . Cephalosporins Hives, Nausea And  Vomiting, Other (See Comments) and Shortness Of Breath    Breathing difficulty  Breathing difficulty  Breathing difficulty  . Ciprofloxacin Anaphylaxis, Hives, Other (See Comments) and Shortness Of Breath    Difficulty breathing  . Haemophilus Influenzae Type B Other (See Comments)    Numbness in legs  . Latex Hives, Itching and Rash  . Levalbuterol  Hcl Anxiety and Other (See Comments)    chattering  Tremors with violent body chattering and shaking  chattering  chattering  Tremors with violent body chattering and shaking  . Nitrofurantoin Macrocrystalline Hives, Itching and Swelling  . Other Hives  . Penicillins Hives, Itching and Other (See Comments)    unknown  . Prednisone  Other (See Comments) and Palpitations    No injections, can do the pills  No injections, can do the pills  No injections, can do the pills  . Pregabalin Dizziness, Hallucination and Other (See Comments)  . Shellfish Containing Products Hives  . Sulfa (Sulfonamide Antibiotics) Hives and Other (See Comments)    Flank pain  Other reaction(s): Other (See Comments)  Flank pain  unknown    Flank pain  unknown  . Zoster Vaccine Live Other (See Comments)    Pt has been told by specialists not to receive vaccine d/t h/o herpes in bloodstream and shingles.  Pt has been told by specialists not to receive vaccine d/t h/o herpes in bloodstream and shingles.  Pt  has been told by specialists not to receive vaccine d/t h/o herpes in bloodstream and shingles.  . Aspirin Other (See Comments) and Nausea And Vomiting    Internal burning  Burns stomach  Stomach pain/burning  Internal burning  Stomach pain/burning  . Chromium And Derivatives Other (See Comments)    Back redness  blisters  Blisters  unknown  unknown  Blisters  . Erythromycin Abdominal Pain, Other (See Comments), Nausea And Vomiting and Nausea    Violent stomach ache  Stomach issues   stomach issues  stomach issues  Violent  stomach ache  stomach issues, Violent stomach ache  stomach issues  Violent stomach ache  stomach issues  . Hydrochlorothiazide  Other (See Comments)    migraine  Migraine  migraine  migraine  Migraine  . Lactose Other (See Comments) and Nausea And Vomiting    stomach issues  stomach issues  stomach issues  stomach issues    Other Reaction(s): GI Intolerance  . Lisinopril Cough and Other (See Comments)    coughing  coughing  coughing  . Petrolatum Other (See Comments) and Nausea  . Ropinirole  Hcl Nausea And Vomiting  . Golimumab  Other (See Comments)    Felt poorly (Simponi )  . Ibuprofen Other (See Comments) and Unknown    Stomach upset  . Cefdinir Other (See Comments), Nausea And Vomiting and Vomiting  . Egg Other (See Comments)    Per allergy testing, no known reaction. Pt states she was told she could probably eat 2-3 eggs weekly w/o issue.  . Iodinated Contrast Media Other (See Comments)    unknown  . Oxycodone  Hcl-Oxycodone -Asa Other (See Comments) and Nausea And Vomiting    Current Outpatient Medications on File Prior to Visit  Medication Sig Dispense Refill  . ascorbic acid, vitamin C, (VITAMIN C) 1000 MG tablet Take 1,000 mg by mouth once daily    . atorvastatin  (LIPITOR) 10 MG tablet Take 10 mg by mouth once daily    . butalbital-acetaminophen -caffeine (FIORICET) 50-325-40 mg tablet Take 2 tablets by mouth 2 (two) times daily as needed    . carboxymethylcell-glycerin,PF, (REFRESH TEARS PF) 0.5-0.9 % Drop Apply 1 drop to eye    . cholecalciferol (VITAMIN D3) 5,000 unit capsule Take 5,000 Units by mouth once daily    . FLUoxetine  (PROZAC ) 20 MG capsule Take 20 mg by mouth once daily    . ibuprofen (MOTRIN) 200 MG tablet Take 200 mg by mouth every 6 (six) hours as needed    . montelukast  (SINGULAIR ) 10 mg tablet Take 10 mg by mouth at bedtime    . nebivoloL  (BYSTOLIC ) 10 MG tablet Take 10 mg by mouth once daily    . NP THYROID  30 mg tablet  Take 30 mg by mouth once daily    . prednicarbate (DERMATOP) 0.1 % topical cream APPLY A THIN LAYER TO THE AFFECTED AREA(S) BY TOPICAL ROUTE FOR ITCHING    . sodium chloride  (OCEAN) 0.65 % nasal spray Compounded nasal spray- Use as directed    . spironolactone  (ALDACTONE ) 25 MG tablet Take 12.5 mg by mouth once daily    . valsartan  (DIOVAN ) 320 MG tablet Take 320 mg by mouth once daily     No current facility-administered medications on file prior to visit.    Family History  Problem Relation Age of Onset  . Skin cancer Father   . High blood pressure (Hypertension) Father      Social History   Tobacco Use  Smoking Status Never  Smokeless Tobacco Never  Social History   Socioeconomic History  . Marital status: Married  Tobacco Use  . Smoking status: Never  . Smokeless tobacco: Never  Vaping Use  . Vaping status: Never Used  Substance and Sexual Activity  . Drug use: Never   Social Drivers of Corporate investment banker Strain: Low Risk  (12/11/2022)   Received from [facility]   Overall Financial Resource Strain (CARDIA)   . Difficulty of Paying Living Expenses: Not hard at all  Food Insecurity: No Food Insecurity (03/18/2023)   Received from [facility]   Hunger Vital Sign   . Within the past 12 months, you worried that your food would run out before you got the money to buy more.: Never true   . Within the past 12 months, the food you bought just didn't last and you didn't have money to get more.: Never true  Transportation Needs: No Transportation Needs (03/18/2023)   Received from [facility] - Transportation   . Lack of Transportation (Medical): No   . Lack of Transportation (Non-Medical): No  Physical Activity: Sufficiently Active (12/11/2022)   Received from [facility]   Exercise Vital Sign   . On average, how many days per week do you engage in moderate to strenuous exercise (like a brisk walk)?: 3 days   . On average, how many minutes do  you engage in exercise at this level?: 60 min  Stress: No Stress Concern Present (03/18/2023)   Received from [facility] - Occupational Stress Questionnaire   . Feeling of Stress : Not at all  Social Connections: Socially Integrated (03/11/2023)   Received from [facility]   Social Connection and Isolation Panel   . In a typical week, how many times do you talk on the phone with family, friends, or neighbors?: More than three times a week   . How often do you get together with friends or relatives?: More than three times a week   . How often do you attend church or religious services?: More than 4 times per year   . Do you belong to any clubs or organizations such as church groups, unions, fraternal or athletic groups, or school groups?: Yes   . How often do you attend meetings of the clubs or organizations you belong to?: More than 4 times per year   . Are you married, widowed, divorced, separated, never married, or living with a partner?: Married  Housing Stability: Unknown (09/04/2023)   Housing Stability Vital Sign   . Homeless in the Last Year: No    Objective:   Vitals:   09/04/23 0931  BP: (!) 152/88  Pulse: 65  Temp: 36.7 C (98 F)  SpO2: 98%  Weight: 81.6 kg (179 lb 12.8 oz)  Height: 158.8 cm (5' 2.5)  PainSc: 0-No pain    Body mass index is 32.36 kg/m.  Physical Exam   A chaperone was present for the physical examination  She appears well on exam  There are no palpable breast masses.  The nipple-areolar complex is normal.  There is no axillary adenopathy on either side.  Labs, Imaging and Diagnostic Testing: Reviewed her mammograms, ultrasound, and pathology results  Assessment and Plan:     Diagnoses and all orders for this visit:  Invasive ductal carcinoma of breast, female, right (CMS/HHS-HCC) -     Ambulatory Referral to Radiation Oncology -     Ambulatory Referral to Oncology-Medical  I discussed the  diagnosis with the patient and her husband and gave them the pathology results.  We have also discussed her in our multidisciplinary breast cancer conference.  From a surgical standpoint we discussed surgery for breast cancer.  We discussed conservative surgery with lumpectomy versus mastectomy and the results of each.  She is interested in breast conservation.  I next discussed proceeding with a radioactive seed guided right breast lumpectomy.  I explained the surgical procedure in detail.  We discussed the risk of surgery which includes but is not limited to bleeding, infection, the need for further surgery if margins are positive, cardiopulmonary issues with anesthesia, blood clots, etc.  We discussed postoperative recovery. We also discussed that she will be referred to medical and radiation oncology for their opinion as well.  They understand and agree with surgery which will be scheduled  Complex medical decision making    VICENTA DASIE POLI, MD    I spent a total of 55 minutes in both face-to-face and non-face-to-face activities, excluding procedures performed, for this visit on the date of this encounter.

## 2023-09-04 NOTE — Telephone Encounter (Signed)
 7/18 @ 1:42 pm Left voicemail for patient to call our office to be sch for consult.

## 2023-09-07 ENCOUNTER — Ambulatory Visit
Admission: RE | Admit: 2023-09-07 | Discharge: 2023-09-07 | Disposition: A | Discharge status: Home / Self Care | Source: Ambulatory Visit | Attending: Radiation Oncology | Admitting: Radiation Oncology

## 2023-09-07 ENCOUNTER — Encounter: Payer: Self-pay | Admitting: Radiation Oncology

## 2023-09-07 ENCOUNTER — Telehealth: Payer: Self-pay

## 2023-09-07 ENCOUNTER — Other Ambulatory Visit (INDEPENDENT_AMBULATORY_CARE_PROVIDER_SITE_OTHER)

## 2023-09-07 DIAGNOSIS — C50311 Malignant neoplasm of lower-inner quadrant of right female breast: Secondary | ICD-10-CM | POA: Diagnosis not present

## 2023-09-07 DIAGNOSIS — Z170 Estrogen receptor positive status [ER+]: Secondary | ICD-10-CM | POA: Diagnosis not present

## 2023-09-07 DIAGNOSIS — D62 Acute posthemorrhagic anemia: Secondary | ICD-10-CM

## 2023-09-07 NOTE — Progress Notes (Signed)
 Radiation Oncology         [phone] ________________________________  Initial outpatient Consultation by telephone.  The patient opted for telemedicine to maximize safety during the pandemic.  MyChart video was not obtainable.   Name: Angela Gibbs MRN: [id number]  Date: 09/07/2023  DOB: 04-04-1940  RR:Rneojwi, [name], MD  Vernetta Berg, MD   REFERRING PHYSICIAN: Vernetta Berg, MD  DIAGNOSIS:    ICD-10-CM   1. Carcinoma of lower-inner quadrant of right breast in female, estrogen receptor positive (HCC)  C50.311    Z17.0        Cancer Staging  Carcinoma of lower-inner quadrant of right breast in female, estrogen receptor positive (HCC) Staging form: Breast, AJCC 8th Edition - Clinical stage from 09/07/2023: Stage IA (cT1b, cN0, cM0, G2, ER+, PR+, HER2-) - Signed by Izell Domino, MD on 09/07/2023 Stage prefix: Initial diagnosis Histologic grading system: 3 grade system    CHIEF COMPLAINT: Here to discuss management of Right breast cancer  HISTORY OF PRESENT ILLNESS::Angela Gibbs is a 83 y.o. female who presented with breast abnormality on the following imaging: Chest CT scan in May 2025 which revealed a 6 mm possible mass in the right lower inner breast which was new since 2021 (and note in the report acknowledged that it may correspond to an asymmetry for which biopsy was recommended in 2023)  She then proceeded to undergo a 3D diagnostic mammogram on June  17th 2025 which revealed a lower inner quadrant right breast irregular mass.  Targeted ultrasound measured this to be 9 mm in greatest dimension and ultrasound of the right axilla was negative.  Mammogram of the left breast was negative.   biopsy of Right breast on date of 08/18/23 showed invasive ductal carcinoma which was noted to be grade 2.  ER status: 95% positive and strong staining; PR status 90% positive and moderate to strong staining, Her2 status negative    Past/Anticipated interventions by  surgeon, if any:  Vernetta, MD Lumpectomy   Past/Anticipated interventions by medical oncology, if any: pending appt  Lymphedema issues, if any:   None   Pain issues, if any:  None  SAFETY ISSUES: Prior radiation? None Pacemaker/ICD? None Possible current pregnancy?N/A Is the patient on methotrexate? None  Current Complaints / other details:   She has multiple comorbidities and her husband states that she often has rare complications to interventions     PREVIOUS RADIATION THERAPY: No  PAST MEDICAL HISTORY:  has a past medical history of Abdominal pain, acute, right upper quadrant (07/29/2013), Allergy, Aortic atherosclerosis (HCC) (01/21/2023), Body mass index (BMI) 31.0-31.9, adult (12/13/2021), Cataracts, bilateral (06/06/2013), Chalazion of right upper eyelid (06/06/2014), Concussion, Current use of beta blocker (04/11/2016), DOE (dyspnea on exertion) (12/25/2022), Dyspnea on exertion (12/25/2021), Eczema (12/24/2022), Essential hypertension (02/20/2016), Eyelid lesion (06/06/2013), Falls, Fatigue (12/24/2022), Gastroesophageal reflux disease without esophagitis (04/11/2016), Head injury, Herpes, IDA (iron  deficiency anemia) (01/11/2020), Inflammatory polyarthropathy (HCC) (12/24/2022), Iron  deficiency anemia due to chronic blood loss (12/28/2019), Joint pain (12/24/2022), Lymphedema (12/24/2022), Migraines, Moderate persistent asthma without complication (05/13/2016), Nerve damage, Nuclear sclerosis of both eyes (06/06/2014), Obesity (BMI 30.0-34.9) (12/25/2021), Osteoarthritis (12/07/2019), Osteopenia (08/12/2022), Other allergic rhinitis (04/11/2016), Positive FIT (fecal immunochemical test) (12/28/2019), Psoriasis (12/07/2019), Psoriatic arthritis (HCC) (12/07/2019), PVD (posterior vitreous detachment), both eyes (03/19/2014), Restrictive lung disease (05/13/2016), Retinal tear of right eye (02/13/2014), Shingles, Sjogren syndrome, unspecified (HCC) (12/24/2022), and TIA (transient  ischemic attack) (05/18/2019).    PAST SURGICAL HISTORY: Past Surgical History:  Procedure Laterality Date  BIOPSY  03/12/2023   Procedure: BIOPSY;  Surgeon: Elicia Claw, MD;  Location: [facility];  Service: Gastroenterology;;   BRAIN SURGERY     BREAST BIOPSY Right 08/18/2023   US  RT BREAST BX W LOC DEV 1ST LESION IMG BX SPEC US  GUIDE 08/18/2023 [facility] MAMMOGRAPHY   CRANIOTOMY FOR EXTRACRANIAL-INTRACRANIAL BYPASS     ESOPHAGOGASTRODUODENOSCOPY (EGD) WITH PROPOFOL  N/A 03/12/2023   Procedure: ESOPHAGOGASTRODUODENOSCOPY (EGD) WITH PROPOFOL ;  Surgeon: Elicia Claw, MD;  Location: [facility];  Service: Gastroenterology;  Laterality: N/A;   EYE SURGERY  4.4.17   milia excision   HIP SURGERY     intracranial surgery   NERVE SURGERY     ROOT CANAL     TONSILLECTOMY      FAMILY HISTORY: family history includes Heart attack in her father; Heart disease in her father.  SOCIAL HISTORY:  reports that she has never smoked. She has never used smokeless tobacco. She reports current alcohol use. She reports that she does not use drugs.  ALLERGIES: Cephalosporins; Ciprofloxacin; Influenza virus vaccine; Keflex  [cephalexin ]; Latex; Levaquin [levofloxacin]; Nitrofuran derivatives; Penicillins; Sulfacetamide sodium; Tape; Xopenex  [levalbuterol  hcl]; Zostavax [zoster vaccine live]; Erythromycin; Hydrochlorothiazide ; Lisinopril; Petrolatum; Pregabalin; Ropinirole  hcl; Shellfish allergy; Sulfa antibiotics; Golimumab ; Ibuprofen; Lactose; Aspirin; Avelox [moxifloxacin hcl in nacl]; Cefdinir; Chromium; Egg solids, whole; Egg-derived products; Iodinated contrast media; Lactose intolerance (gi); Percodan [oxycodone -aspirin]; Prednisone ; and Troleandomycin  MEDICATIONS:  Current Outpatient Medications  Medication Sig Dispense Refill   ALPRAZolam  (XANAX ) 0.25 MG tablet TAKE 1 TABLET(0.25 MG) BY MOUTH TWICE DAILY AS NEEDED FOR ANXIETY 30 tablet 0   Ascorbic Acid (VITAMIN C) 1000 MG tablet Take 1,000 mg by  mouth daily.     atorvastatin  (LIPITOR) 10 MG tablet Take 1 tablet (10 mg total) by mouth daily. 90 tablet 3   butalbital-acetaminophen -caffeine (FIORICET) 50-325-40 MG tablet Take 1-2 tablets by mouth 2 (two) times daily as needed for headache (or leg pain).     Cholecalciferol (VITAMIN D3) 125 MCG (5000 UT) CAPS Take 5,000 Units by mouth daily.     ibuprofen (ADVIL) 200 MG tablet Take 200 mg by mouth every 6 (six) hours as needed.     MAGNESIUM GLYCINATE PO Take 50 mg by mouth daily.     montelukast  (SINGULAIR ) 10 MG tablet TAKE 1 TABLET(10 MG) BY MOUTH AT BEDTIME 30 tablet 2   nebivolol  (BYSTOLIC ) 10 MG tablet Take 1 tablet (10 mg total) by mouth daily. 90 tablet 1   NP THYROID  30 MG tablet Take 30 mg by mouth daily before breakfast.     Prednicarbate 0.1 % CREA Apply 1 Application topically as needed for itching (affected sites).     REFRESH TEARS PF 0.5-0.9 % SOLN Place 1 drop into both eyes 3 (three) times daily as needed (for irritation).     sodium chloride  (V-R NASAL SPRAY SALINE) 0.65 % nasal spray Place 1 spray into the nose as needed. Use in both nostrils twice daily 30 mL 6   valsartan  (DIOVAN ) 320 MG tablet TAKE 1 TABLET(320 MG) BY MOUTH DAILY 90 tablet 3   FLUoxetine  (PROZAC ) 20 MG capsule Take 1 capsule (20 mg total) by mouth daily. (Patient not taking: Reported on 09/07/2023) 90 capsule 1   pantoprazole  (PROTONIX ) 40 MG tablet Take 1 tablet (40 mg total) by mouth daily. (Patient not taking: Reported on 09/07/2023) 30 tablet 1   spironolactone  (ALDACTONE ) 25 MG tablet TAKE 1/2 TABLET(12.5 MG) BY MOUTH DAILY (Patient not taking: Reported on 09/07/2023) 30 tablet 3   UNKNOWN TO  PATIENT Take 1 tablet by mouth See admin instructions. Unnamed OTC for restless legs: Take 1 tablet by mouth every other night     No current facility-administered medications for this encounter.    REVIEW OF SYSTEMS: As above in HPI.   PHYSICAL EXAM:  vitals were not taken for this visit.    Gen: Alert and  oriented in no acute distress   LABORATORY DATA:   CBC    Component Value Date/Time   WBC 5.3 08/13/2023 1616   RBC 4.08 08/13/2023 1616   HGB 12.7 08/13/2023 1616   HGB 11.7 (L) 05/12/2023 1244   HCT 36.9 08/13/2023 1616   PLT 220.0 08/13/2023 1616   PLT 218 05/12/2023 1244   MCV 90.5 08/13/2023 1616   MCH 27.6 05/12/2023 1244   MCHC 34.4 08/13/2023 1616   RDW 13.0 08/13/2023 1616   LYMPHSABS 0.9 05/12/2023 1244   MONOABS 0.6 05/12/2023 1244   EOSABS 0.2 05/12/2023 1244   BASOSABS 0.1 05/12/2023 1244    CMP     Component Value Date/Time   NA 132 (L) 08/13/2023 1616   NA 133 (L) 02/16/2023 1341   K 4.4 08/13/2023 1616   CL 98 08/13/2023 1616   CO2 29 08/13/2023 1616   GLUCOSE 79 08/13/2023 1616   BUN 16 08/13/2023 1616   BUN 15 02/16/2023 1341   CREATININE 0.64 08/13/2023 1616   CREATININE 0.70 05/01/2021 1430   CREATININE 0.76 12/07/2019 1413   CALCIUM  9.0 08/13/2023 1616   PROT 6.6 08/13/2023 1616   PROT 6.5 01/22/2023 1602   ALBUMIN 3.9 08/13/2023 1616   ALBUMIN 3.9 01/22/2023 1602   AST 18 08/13/2023 1616   AST 18 05/01/2021 1430   ALT 17 08/13/2023 1616   ALT 16 05/01/2021 1430   ALKPHOS 71 08/13/2023 1616   BILITOT 0.6 08/13/2023 1616   BILITOT 0.3 01/22/2023 1602   BILITOT 0.4 05/01/2021 1430   GFR 81.99 08/13/2023 1616   EGFR 83 02/16/2023 1341   GFRNONAA >60 03/12/2023 0353   GFRNONAA >60 05/01/2021 1430      RADIOGRAPHY: US  RT BREAST BX W LOC DEV 1ST LESION IMG BX SPEC US  GUIDE Addendum Date: 08/27/2023 ADDENDUM REPORT: 08/27/2023 08:05 ADDENDUM: Pathology revealed GRADE II INVASIVE DUCTAL CARCINOMA of the RIGHT breast, 4 o'clock, 2 CMFN, (coil clip). This was found to be concordant by Dr. Aliene Mir. Pathology results were discussed with the patient and her husband by telephone. The patient reported doing well after the biopsy with moderate tenderness, bleeding and bruising at the site. Post biopsy instructions and Gibbs were reviewed and  questions were answered. The patient was encouraged to call [facility] for any additional concerns. My direct phone number was provided. Surgical consultation has been arranged with Dr. Georgette Poli at [facility] on September 04, 2023. Pathology results reported by Hendricks Benders, RN on 08/27/2023. Electronically Signed   By: Aliene Lloyd M.D.   On: 08/27/2023 08:05   Result Date: 08/27/2023 CLINICAL DATA:  83 year old woman with highly suspicious RIGHT breast mass presents for ultrasound-guided core needle biopsy. EXAM: ULTRASOUND GUIDED RIGHT BREAST CORE NEEDLE BIOPSY COMPARISON:  Previous exam(s). PROCEDURE: I met with the patient and we discussed the procedure of ultrasound-guided biopsy, including benefits and alternatives. We discussed the high likelihood of a successful procedure. We discussed the risks of the procedure, including infection, bleeding, tissue injury, clip migration, and inadequate sampling. Informed written consent was given. The usual time-out protocol was performed immediately  prior to the procedure. Lesion quadrant: Lower inner quadrant Using sterile technique and 1% Lidocaine  as local anesthetic, under direct ultrasound visualization, a 14 gauge spring-loaded device was used to perform biopsy of the 0.9 cm RIGHT breast mass (4 o'clock 2 CMFN) using an inferior approach. At the conclusion of the procedure a coil shaped tissue marker clip was deployed into the biopsy cavity. Follow up 2 view mammogram was performed and dictated separately. IMPRESSION: Ultrasound guided biopsy of 0.9 cm RIGHT breast mass (4 o'clock 2 CMFN). No apparent complications. Electronically Signed: By: Aliene Lloyd M.D. On: 08/18/2023 12:32   MM CLIP PLACEMENT RIGHT Result Date: 08/18/2023 CLINICAL DATA:  Status post ultrasound-guided biopsy of RIGHT breast mass. EXAM: 3D DIAGNOSTIC RIGHT MAMMOGRAM POST ULTRASOUND BIOPSY COMPARISON:  Previous exam(s). ACR Breast Density Category  b: There are scattered areas of fibroglandular density. FINDINGS: 3D Mammographic images were obtained following ultrasound guided biopsy of RIGHT breast mass (4 o'clock 2 CMFN). The biopsy marking clip is in expected position at the site of biopsy. IMPRESSION: Appropriate positioning of the coil shaped biopsy marking clip at the site of biopsy in the lower inner RIGHT breast. Final Assessment: Post Procedure Mammograms for Marker Placement Electronically Signed   By: Aliene Lloyd M.D.   On: 08/18/2023 12:33      IMPRESSION/PLAN: Right breast cancer - anticipating lumpectomy  For the patient's early stage favorable risk breast cancer, we had a thorough discussion about her options for adjuvant therapy. One option would be antiestrogen therapy as discussed with medical oncology. She would take a pill for approximately 5 years.  Alternatively, she could pursue adjuvant radiation therapy.  She is elderly with many comorbidities and is interested in understanding both types of adjuvant therapies and how they could affect her quality of life in the short-term and long-term.  She does express some reservations regarding taking a daily medication for multiple years but I encouraged her to still meet with medical oncology so she knows her options     We discussed the risks benefits and side effects of optional radiotherapy. She understands that the side effects would likely include some skin irritation and fatigue during the weeks of radiation. There is a risk of late effects which include but are not necessarily limited to cosmetic changes and rare lung toxicity. I would anticipate delivering approximately 1-4 weeks of radiotherapy    After a thorough discussion of standard hypofractionation over 3-4 weeks, we discussed 1 week of ultra hypofractionated radiation therapy. For elderly patients that are keen on the convenience of minimizing the number of procedures/visits to the cancer center, this is a  a reasonable  approach to consider and the vast majority of patients do very well. There is an option of RT Monday through Friday over 1 single week; or, patients can pursue treatment once weekly for 5 weeks.   She is enthusiastic about seeing me back following her surgery to come up with a final plan.   We spoke about other general acute effects of breast radiation including skin irritation and fatigue as well as breast fibrosis, induration, and /or swelling long term, and much less common late effects including internal organ injury or irritation. We spoke about the latest technology that is used to minimize the risk of late effects for patients undergoing radiotherapy to the breast or chest wall. No guarantees of treatment were given. The patient is enthusiastic about proceeding with treatment.  I look forward to participating in the patient's Gibbs.  I  will await her referral back to me for postoperative follow-up and eventual CT simulation/treatment planning.      This encounter was provided by telemedicine platform; patient desired telemedicine during pandemic precautions.  MyChart really video was not available and therefore telephone was used. The patient has given verbal consent for this type of encounter and has been advised to only accept a meeting of this type in a secure network environment. On date of service, in total, I spent 45 minutes on this encounter.   The attendants for this meeting include Lauraine Golden  and Gaylan CHRISTELLA Pizza During the encounter, Lauraine Golden was located at [facility] Radiation Oncology Department.  Raeana M Elie was located at home.     __________________________________________   Lauraine Golden, MD

## 2023-09-07 NOTE — Progress Notes (Signed)
 Location of Breast Cancer:  Invasive Ductal Carcinoma Right Breast   Histology per Pathology Report:    06/24/2023 CT Chest Wo Contrast   Did patient present with symptoms (if so, please note symptoms) or was this found on screening mammography?:  Mammogram   Past/Anticipated interventions by surgeon, if any:  Vernetta, MD Lumpectomy   Past/Anticipated interventions by medical oncology, if any:  Lymphedema issues, if any:   None   Pain issues, if any:  None  SAFETY ISSUES: Prior radiation? None Pacemaker/ICD? None Possible current pregnancy?N/A Is the patient on methotrexate? None  Current Complaints / other details:  None

## 2023-09-07 NOTE — Telephone Encounter (Signed)
 Scheduled pt a follow up lab appointment this afternoon 09/07/2023.

## 2023-09-07 NOTE — Telephone Encounter (Signed)
 Copied from CRM [id number]. Topic: Clinical - Request for Lab/Test Order >> Sep 07, 2023  9:43 AM Rea BROCKS wrote: Reason for CRM: Patient's spouse called in and would like to request lab work for patients hemoglobin since she's had a blood transfusion, and due to still having fatigue symptoms, and they are requesting if the labs can be put in for today. They would like to get tests done after 2:30.  Contact is [phone].

## 2023-09-08 ENCOUNTER — Encounter: Payer: Self-pay | Admitting: Family Medicine

## 2023-09-08 DIAGNOSIS — M62838 Other muscle spasm: Secondary | ICD-10-CM | POA: Diagnosis not present

## 2023-09-08 DIAGNOSIS — Z5181 Encounter for therapeutic drug level monitoring: Secondary | ICD-10-CM | POA: Diagnosis not present

## 2023-09-08 DIAGNOSIS — Z79899 Other long term (current) drug therapy: Secondary | ICD-10-CM | POA: Diagnosis not present

## 2023-09-08 DIAGNOSIS — G4489 Other headache syndrome: Secondary | ICD-10-CM | POA: Diagnosis not present

## 2023-09-08 DIAGNOSIS — M79604 Pain in right leg: Secondary | ICD-10-CM | POA: Diagnosis not present

## 2023-09-08 DIAGNOSIS — M545 Low back pain, unspecified: Secondary | ICD-10-CM | POA: Diagnosis not present

## 2023-09-08 DIAGNOSIS — G894 Chronic pain syndrome: Secondary | ICD-10-CM | POA: Diagnosis not present

## 2023-09-08 LAB — CBC
HCT: 37.9 % (ref 36.0–46.0)
Hemoglobin: 12.7 g/dL (ref 12.0–15.0)
MCHC: 33.4 g/dL (ref 30.0–36.0)
MCV: 91.5 fl (ref 78.0–100.0)
Platelets: 263 K/uL (ref 150.0–400.0)
RBC: 4.14 Mil/uL (ref 3.87–5.11)
RDW: 13.1 % (ref 11.5–15.5)
WBC: 5.7 K/uL (ref 4.0–10.5)

## 2023-09-09 DIAGNOSIS — I10 Essential (primary) hypertension: Secondary | ICD-10-CM | POA: Diagnosis not present

## 2023-09-09 DIAGNOSIS — G5622 Lesion of ulnar nerve, left upper limb: Secondary | ICD-10-CM | POA: Diagnosis not present

## 2023-09-09 DIAGNOSIS — G5602 Carpal tunnel syndrome, left upper limb: Secondary | ICD-10-CM | POA: Diagnosis not present

## 2023-09-09 NOTE — Telephone Encounter (Signed)
 Copied from CRM [id number]. Topic: Clinical - Lab/Test Results >> Sep 09, 2023 12:54 PM Franky GRADE wrote: Reason for CRM: Patient's husband is calling to get the results for labs done on 09/07/2023. He received a message stating results were available on MyChart.

## 2023-09-09 NOTE — Telephone Encounter (Signed)
Results released to Mychart 

## 2023-09-10 ENCOUNTER — Other Ambulatory Visit: Payer: Self-pay | Admitting: Surgery

## 2023-09-10 DIAGNOSIS — Z853 Personal history of malignant neoplasm of breast: Secondary | ICD-10-CM

## 2023-09-10 NOTE — Telephone Encounter (Signed)
 Copied from CRM [id number]. Topic: Appointments - Appointment Info/Confirmation >> Sep 10, 2023  9:40 AM Rosina BIRCH wrote: Patient/patient representative is calling for information regarding an appointment. Patient husband is calling wanting to know the reason the patient has an oncology appointment scheduled and also the patient can not get on MyChart to get the lab results CB [phone]

## 2023-09-10 NOTE — Telephone Encounter (Signed)
 I called and answer their questions.  They are not sure why she needs to see an oncologist - I explained

## 2023-09-10 NOTE — Telephone Encounter (Signed)
 Please advise regarding oncology appt?

## 2023-09-11 ENCOUNTER — Telehealth: Payer: Self-pay

## 2023-09-11 NOTE — Telephone Encounter (Signed)
 Please give them a call back.  We recently did a CBC which showed normal blood cell counts/ no anemia.    Otherwise most recent labs one month ago. If she is not ok/ if she is having acute symptoms of lethargy she needs to be seen Thank you

## 2023-09-11 NOTE — Telephone Encounter (Signed)
 Copied from CRM [id number]. Topic: Clinical - Lab/Test Results >> Sep 10, 2023  5:15 PM Lavanda D wrote: Reason for CRM: Patients husband is calling back to speak with Dr. Watt regarding lab results, he said that he just got off the phone with her and all of his questions were answered but he forgot to bring up the labs. He said that she has been feeling lethargic recently.

## 2023-09-14 ENCOUNTER — Telehealth: Payer: Self-pay | Admitting: Hematology

## 2023-09-14 ENCOUNTER — Other Ambulatory Visit: Payer: Self-pay | Admitting: *Deleted

## 2023-09-14 DIAGNOSIS — Z170 Estrogen receptor positive status [ER+]: Secondary | ICD-10-CM

## 2023-09-14 NOTE — Telephone Encounter (Signed)
 Canceled appointments per pathet patines

## 2023-09-16 ENCOUNTER — Ambulatory Visit: Admitting: Hematology

## 2023-09-16 ENCOUNTER — Other Ambulatory Visit

## 2023-09-17 ENCOUNTER — Encounter (HOSPITAL_BASED_OUTPATIENT_CLINIC_OR_DEPARTMENT_OTHER): Payer: Self-pay | Admitting: Surgery

## 2023-09-22 ENCOUNTER — Encounter (HOSPITAL_BASED_OUTPATIENT_CLINIC_OR_DEPARTMENT_OTHER)
Admission: RE | Admit: 2023-09-22 | Discharge: 2023-09-22 | Disposition: A | Discharge status: Home / Self Care | Source: Ambulatory Visit | Attending: Surgery | Admitting: Surgery

## 2023-09-22 ENCOUNTER — Ambulatory Visit
Admission: RE | Admit: 2023-09-22 | Discharge: 2023-09-22 | Disposition: A | Discharge status: Home / Self Care | Source: Ambulatory Visit | Attending: Surgery | Admitting: Surgery

## 2023-09-22 DIAGNOSIS — Z01812 Encounter for preprocedural laboratory examination: Secondary | ICD-10-CM | POA: Diagnosis not present

## 2023-09-22 DIAGNOSIS — Z853 Personal history of malignant neoplasm of breast: Secondary | ICD-10-CM

## 2023-09-22 DIAGNOSIS — C50911 Malignant neoplasm of unspecified site of right female breast: Secondary | ICD-10-CM | POA: Diagnosis not present

## 2023-09-22 HISTORY — PX: BREAST BIOPSY: SHX20

## 2023-09-22 LAB — BASIC METABOLIC PANEL WITH GFR
Anion gap: 10 (ref 5–15)
BUN: 14 mg/dL (ref 8–23)
CO2: 26 mmol/L (ref 22–32)
Calcium: 9.2 mg/dL (ref 8.9–10.3)
Chloride: 98 mmol/L (ref 98–111)
Creatinine, Ser: 0.67 mg/dL (ref 0.44–1.00)
GFR, Estimated: >60 mL/min (ref >60)
Glucose, Bld: 90 mg/dL (ref 70–99)
Potassium: 4.8 mmol/L (ref 3.5–5.1)
Sodium: 134 mmol/L — ABNORMAL LOW (ref 135–145)

## 2023-09-22 MED ORDER — CHLORHEXIDINE GLUCONATE CLOTH 2 % EX PADS: 6.0000 | MEDICATED_PAD | Freq: Once | CUTANEOUS | Status: DC

## 2023-09-22 MED ORDER — ENSURE PRE-SURGERY PO LIQD: 296.0000 mL | Freq: Once | ORAL | Status: DC

## 2023-09-22 NOTE — Progress Notes (Signed)

## 2023-09-23 NOTE — H&P (Signed)
 REFERRING PHYSICIAN: Copland, Harlene Solan* PROVIDER: VICENTA DASIE POLI, MD MRN: [id number] DOB: [birth year]  Subjective   Chief Complaint: New Consultation (Rt breast cancer)  History of Present Illness: Angela Gibbs is a 83 y.o. female who is seen  as an office consultation for evaluation of New Consultation (Rt breast cancer)  This is an 83 year old female who is accompanied by her husband. She was recently diagnosed with a right breast cancer. She was found to have a mass in her breast found on a CT scan for screening purposes. On imaging, the mass measured 9 mm at the 4 o'clock position of the right breast 2 cm from the nipple on mammography. Ultrasound the axilla was negative. A biopsy of the mass showed an invasive ductal carcinoma. She has had no previous problems regarding her breast. Denies nipple discharge. There is no family history of breast cancer. She has had no previous problems regarding general anesthesia  Review of Systems: A complete review of systems was obtained from the patient. I have reviewed this information and discussed as appropriate with the patient. See HPI as well for other ROS.  ROS   Medical History: Past Medical History:  Diagnosis Date  Anemia  Anxiety  Arthritis  Asthma, unspecified asthma severity, unspecified whether complicated, unspecified whether persistent (HHS-HCC)  GERD (gastroesophageal reflux disease)  History of cancer  Hyperlipidemia  Hypertension  Thyroid  disease   There is no problem list on file for this patient.  Past Surgical History:  Procedure Laterality Date  Breast biopsy (Right) 08/18/2023  CRANIOTOMY FOR EXTRACRANIAL-INTRACRANIAL BYPASS  Hip surgery  TONSILLECTOMY    Allergies  Allergen Reactions  Adhesive Itching and Other (See Comments)  RED AND IRRITATED SKIN  Cephalosporins Hives, Nausea And Vomiting, Other (See Comments) and Shortness Of Breath  Breathing difficulty Breathing difficulty  Breathing  difficulty  Ciprofloxacin Anaphylaxis, Hives, Other (See Comments) and Shortness Of Breath  Difficulty breathing  Haemophilus Influenzae Type B Other (See Comments)  Numbness in legs  Latex Hives, Itching and Rash  Levalbuterol  Hcl Anxiety and Other (See Comments)  chattering  Tremors with violent body chattering and shaking chattering chattering  Tremors with violent body chattering and shaking  Nitrofurantoin Macrocrystalline Hives, Itching and Swelling  Other Hives  Penicillins Hives, Itching and Other (See Comments)  unknown  Prednisone  Other (See Comments) and Palpitations  No injections, can do the pills  No injections, can do the pills No injections, can do the pills  Pregabalin Dizziness, Hallucination and Other (See Comments)  Shellfish Containing Products Hives  Sulfa (Sulfonamide Antibiotics) Hives and Other (See Comments)  Flank pain  Other reaction(s): Other (See Comments) Flank pain unknown  Flank pain  unknown  Zoster Vaccine Live Other (See Comments)  Pt has been told by specialists not to receive vaccine d/t h/o herpes in bloodstream and shingles. Pt has been told by specialists not to receive vaccine d/t h/o herpes in bloodstream and shingles.  Pt has been told by specialists not to receive vaccine d/t h/o herpes in bloodstream and shingles.  Aspirin Other (See Comments) and Nausea And Vomiting  Internal burning  Burns stomach Stomach pain/burning Internal burning  Stomach pain/burning  Chromium And Derivatives Other (See Comments)  Back redness  blisters Blisters unknown  unknown  Blisters  Erythromycin Abdominal Pain, Other (See Comments), Nausea And Vomiting and Nausea  Violent stomach ache  Stomach issues  stomach issues stomach issues Violent stomach ache  stomach issues, Violent stomach ache  stomach issues Violent stomach ache  stomach issues  Hydrochlorothiazide  Other (See Comments)   migraine  Migraine migraine migraine  Migraine  Lactose Other (See Comments) and Nausea And Vomiting  stomach issues stomach issues  stomach issues  stomach issues  Other Reaction(s): GI Intolerance  Lisinopril Cough and Other (See Comments)  coughing  coughing coughing  Petrolatum Other (See Comments) and Nausea  Ropinirole  Hcl Nausea And Vomiting  Golimumab  Other (See Comments)  Felt poorly (Simponi )  Ibuprofen Other (See Comments) and Unknown  Stomach upset  Cefdinir Other (See Comments), Nausea And Vomiting and Vomiting  Egg Other (See Comments)  Per allergy testing, no known reaction. Pt states she was told she could probably eat 2-3 eggs weekly w/o issue.  Iodinated Contrast Media Other (See Comments)  unknown  Oxycodone  Hcl-Oxycodone -Asa Other (See Comments) and Nausea And Vomiting   Current Outpatient Medications on File Prior to Visit  Medication Sig Dispense Refill  ascorbic acid, vitamin C, (VITAMIN C) 1000 MG tablet Take 1,000 mg by mouth once daily  atorvastatin  (LIPITOR) 10 MG tablet Take 10 mg by mouth once daily  butalbital-acetaminophen -caffeine (FIORICET) 50-325-40 mg tablet Take 2 tablets by mouth 2 (two) times daily as needed  carboxymethylcell-glycerin,PF, (REFRESH TEARS PF) 0.5-0.9 % Drop Apply 1 drop to eye  cholecalciferol (VITAMIN D3) 5,000 unit capsule Take 5,000 Units by mouth once daily  FLUoxetine  (PROZAC ) 20 MG capsule Take 20 mg by mouth once daily  ibuprofen (MOTRIN) 200 MG tablet Take 200 mg by mouth every 6 (six) hours as needed  montelukast  (SINGULAIR ) 10 mg tablet Take 10 mg by mouth at bedtime  nebivoloL  (BYSTOLIC ) 10 MG tablet Take 10 mg by mouth once daily  NP THYROID  30 mg tablet Take 30 mg by mouth once daily  prednicarbate (DERMATOP) 0.1 % topical cream APPLY A THIN LAYER TO THE AFFECTED AREA(S) BY TOPICAL ROUTE FOR ITCHING  sodium chloride  (OCEAN) 0.65 % nasal spray Compounded nasal spray- Use as directed   spironolactone  (ALDACTONE ) 25 MG tablet Take 12.5 mg by mouth once daily  valsartan  (DIOVAN ) 320 MG tablet Take 320 mg by mouth once daily   No current facility-administered medications on file prior to visit.   Family History  Problem Relation Age of Onset  Skin cancer Father  High blood pressure (Hypertension) Father    Social History   Tobacco Use  Smoking Status Never  Smokeless Tobacco Never    Social History   Socioeconomic History  Marital status: Married  Tobacco Use  Smoking status: Never  Smokeless tobacco: Never  Vaping Use  Vaping status: Never Used  Substance and Sexual Activity  Drug use: Never   Social Drivers of Corporate investment banker Strain: Low Risk (12/11/2022)  Received from [facility]  Overall Financial Resource Strain (CARDIA)  Difficulty of Paying Living Expenses: Not hard at all  Food Insecurity: No Food Insecurity (03/18/2023)  [facility]  Hunger Vital Sign  Within the past 12 months, you worried that your food would run out before you got the money to buy more.: Never true  Within the past 12 months, the food you bought just didn't last and you didn't have money to get more.: Never true  Transportation Needs: No Transportation Needs (03/18/2023)  Received from [facility] - Transportation  Lack of Transportation (Medical): No  Lack of Transportation (Non-Medical): No  Physical Activity: Sufficiently Active (12/11/2022)  Received from [facility]  Exercise Vital Sign  On average, how many days per week do you engage  in moderate to strenuous exercise (like a brisk walk)?: 3 days  On average, how many minutes do you engage in exercise at this level?: 60 min  Stress: No Stress Concern Present (03/18/2023)  Received from [facility] of Occupational Health - Occupational Stress Questionnaire  Feeling of Stress : Not at all  Social Connections: Socially Integrated (03/11/2023)  Received  from [facility]  Social Connection and Isolation Panel  In a typical week, how many times do you talk on the phone with family, friends, or neighbors?: More than three times a week  How often do you get together with friends or relatives?: More than three times a week  How often do you attend church or religious services?: More than 4 times per year  Do you belong to any clubs or organizations such as church groups, unions, fraternal or athletic groups, or school groups?: Yes  How often do you attend meetings of the clubs or organizations you belong to?: More than 4 times per year  Are you married, widowed, divorced, separated, never married, or living with a partner?: Married  Housing Stability: Unknown (09/04/2023)  Housing Stability Vital Sign  Homeless in the Last Year: No   Objective:   Vitals:   BP: (!) 152/88  Pulse: 65  Temp: 36.7 C (98 F)  SpO2: 98%  Weight: 81.6 kg (179 lb 12.8 oz)  Height: 158.8 cm (5' 2.5)  PainSc: 0-No pain   Body mass index is 32.36 kg/m.  Physical Exam   A chaperone was present for the physical examination  She appears well on exam  There are no palpable breast masses. The nipple-areolar complex is normal. There is no axillary adenopathy on either side.  Labs, Imaging and Diagnostic Testing: Reviewed her mammograms, ultrasound, and pathology results  Assessment and Plan:   Diagnoses and all orders for this visit:  Invasive ductal carcinoma of breast, female, right (CMS/HHS-HCC) - Ambulatory Referral to Radiation Oncology - Ambulatory Referral to Oncology-Medical   I discussed the diagnosis with the patient and her husband and gave them the pathology results. We have also discussed her in our multidisciplinary breast cancer conference. From a surgical standpoint we discussed surgery for breast cancer. We discussed conservative surgery with lumpectomy versus mastectomy and the results of each. She is interested in breast conservation. I  next discussed proceeding with a radioactive seed guided right breast lumpectomy. I explained the surgical procedure in detail. We discussed the risk of surgery which includes but is not limited to bleeding, infection, the need for further surgery if margins are positive, cardiopulmonary issues with anesthesia, blood clots, etc. We discussed postoperative recovery. We also discussed that she will be referred to medical and radiation oncology for their opinion as well.

## 2023-09-24 ENCOUNTER — Other Ambulatory Visit: Payer: Self-pay

## 2023-09-24 ENCOUNTER — Ambulatory Visit
Admission: RE | Admit: 2023-09-24 | Discharge: 2023-09-24 | Disposition: A | Discharge status: Home / Self Care | Source: Ambulatory Visit | Attending: Surgery | Admitting: Surgery

## 2023-09-24 ENCOUNTER — Ambulatory Visit (HOSPITAL_BASED_OUTPATIENT_CLINIC_OR_DEPARTMENT_OTHER)
Admission: RE | Admit: 2023-09-24 | Discharge: 2023-09-24 | Disposition: A | Discharge status: Home / Self Care | Attending: Surgery | Admitting: Surgery

## 2023-09-24 ENCOUNTER — Encounter (HOSPITAL_BASED_OUTPATIENT_CLINIC_OR_DEPARTMENT_OTHER)
Admission: RE | Disposition: A | Discharge status: Home / Self Care | Payer: Self-pay | Source: Home / Self Care | Attending: Surgery

## 2023-09-24 ENCOUNTER — Encounter (HOSPITAL_BASED_OUTPATIENT_CLINIC_OR_DEPARTMENT_OTHER): Payer: Self-pay | Admitting: Surgery

## 2023-09-24 ENCOUNTER — Ambulatory Visit (HOSPITAL_BASED_OUTPATIENT_CLINIC_OR_DEPARTMENT_OTHER): Admitting: Anesthesiology

## 2023-09-24 DIAGNOSIS — J4540 Moderate persistent asthma, uncomplicated: Secondary | ICD-10-CM | POA: Insufficient documentation

## 2023-09-24 DIAGNOSIS — Z1732 Human epidermal growth factor receptor 2 negative status: Secondary | ICD-10-CM | POA: Diagnosis not present

## 2023-09-24 DIAGNOSIS — C50311 Malignant neoplasm of lower-inner quadrant of right female breast: Secondary | ICD-10-CM

## 2023-09-24 DIAGNOSIS — Z1721 Progesterone receptor positive status: Secondary | ICD-10-CM | POA: Insufficient documentation

## 2023-09-24 DIAGNOSIS — E785 Hyperlipidemia, unspecified: Secondary | ICD-10-CM | POA: Diagnosis not present

## 2023-09-24 DIAGNOSIS — J45909 Unspecified asthma, uncomplicated: Secondary | ICD-10-CM

## 2023-09-24 DIAGNOSIS — I700 Atherosclerosis of aorta: Secondary | ICD-10-CM | POA: Insufficient documentation

## 2023-09-24 DIAGNOSIS — Z01818 Encounter for other preprocedural examination: Secondary | ICD-10-CM

## 2023-09-24 DIAGNOSIS — Z6831 Body mass index (BMI) 31.0-31.9, adult: Secondary | ICD-10-CM | POA: Insufficient documentation

## 2023-09-24 DIAGNOSIS — I10 Essential (primary) hypertension: Secondary | ICD-10-CM | POA: Diagnosis not present

## 2023-09-24 DIAGNOSIS — Z853 Personal history of malignant neoplasm of breast: Secondary | ICD-10-CM

## 2023-09-24 DIAGNOSIS — E669 Obesity, unspecified: Secondary | ICD-10-CM | POA: Insufficient documentation

## 2023-09-24 DIAGNOSIS — C50911 Malignant neoplasm of unspecified site of right female breast: Secondary | ICD-10-CM | POA: Diagnosis not present

## 2023-09-24 DIAGNOSIS — Z170 Estrogen receptor positive status [ER+]: Secondary | ICD-10-CM | POA: Diagnosis not present

## 2023-09-24 DIAGNOSIS — K219 Gastro-esophageal reflux disease without esophagitis: Secondary | ICD-10-CM | POA: Diagnosis not present

## 2023-09-24 HISTORY — PX: BREAST LUMPECTOMY WITH RADIOACTIVE SEED LOCALIZATION: SHX6424

## 2023-09-24 SURGERY — BREAST LUMPECTOMY WITH RADIOACTIVE SEED LOCALIZATION
Anesthesia: General | Site: Breast | Laterality: Right

## 2023-09-24 MED ORDER — VANCOMYCIN HCL IN DEXTROSE 1-5 GM/200ML-% IV SOLN
INTRAVENOUS | Status: AC
  Filled 2023-09-24: qty 200

## 2023-09-24 MED ORDER — ONDANSETRON HCL 4 MG/2ML IJ SOLN
INTRAMUSCULAR | Status: DC | PRN
  Administered 2023-09-24: 4 mg via INTRAVENOUS

## 2023-09-24 MED ORDER — LIDOCAINE 2% (20 MG/ML) 5 ML SYRINGE
INTRAMUSCULAR | Status: AC
  Filled 2023-09-24: qty 5

## 2023-09-24 MED ORDER — LACTATED RINGERS IV SOLN: INTRAVENOUS | Status: DC

## 2023-09-24 MED ORDER — ONDANSETRON HCL 4 MG/2ML IJ SOLN
INTRAMUSCULAR | Status: AC
  Filled 2023-09-24: qty 2

## 2023-09-24 MED ORDER — EPHEDRINE SULFATE (PRESSORS) 50 MG/ML IJ SOLN
INTRAMUSCULAR | Status: DC | PRN
Start: 2023-09-24 — End: 2023-09-24
  Administered 2023-09-24 (×2): 10 mg via INTRAVENOUS

## 2023-09-24 MED ORDER — LIDOCAINE HCL (CARDIAC) PF 100 MG/5ML IV SOSY
PREFILLED_SYRINGE | INTRAVENOUS | Status: DC | PRN
  Administered 2023-09-24: 50 mg via INTRAVENOUS

## 2023-09-24 MED ORDER — TRAMADOL HCL 50 MG PO TABS: 50.0000 mg | ORAL_TABLET | Freq: Four times a day (QID) | ORAL | 0 refills | Status: AC | PRN

## 2023-09-24 MED ORDER — BUPIVACAINE-EPINEPHRINE 0.5% -1:200000 IJ SOLN
INTRAMUSCULAR | Status: DC | PRN
  Administered 2023-09-24: 20 mL

## 2023-09-24 MED ORDER — FENTANYL CITRATE (PF) 100 MCG/2ML IJ SOLN
INTRAMUSCULAR | Status: DC | PRN
  Administered 2023-09-24: 100 ug via INTRAVENOUS

## 2023-09-24 MED ORDER — VANCOMYCIN HCL IN DEXTROSE 1-5 GM/200ML-% IV SOLN
1000.0000 mg | INTRAVENOUS | Status: AC
  Administered 2023-09-24: 1000 mg via INTRAVENOUS

## 2023-09-24 MED ORDER — PHENYLEPHRINE HCL (PRESSORS) 10 MG/ML IV SOLN
INTRAVENOUS | Status: DC | PRN
  Administered 2023-09-24: 160 ug via INTRAVENOUS

## 2023-09-24 MED ORDER — ACETAMINOPHEN 500 MG PO TABS
ORAL_TABLET | ORAL | Status: AC
  Filled 2023-09-24: qty 2

## 2023-09-24 MED ORDER — LACTATED RINGERS IV SOLN: INTRAVENOUS | Status: DC | PRN

## 2023-09-24 MED ORDER — BUPIVACAINE-EPINEPHRINE (PF) 0.5% -1:200000 IJ SOLN
INTRAMUSCULAR | Status: AC
  Filled 2023-09-24: qty 30

## 2023-09-24 MED ORDER — ACETAMINOPHEN 500 MG PO TABS
1000.0000 mg | ORAL_TABLET | ORAL | Status: AC
  Administered 2023-09-24: 1000 mg via ORAL

## 2023-09-24 MED ORDER — PROPOFOL 10 MG/ML IV BOLUS
INTRAVENOUS | Status: AC
  Filled 2023-09-24: qty 20

## 2023-09-24 MED ORDER — FENTANYL CITRATE (PF) 100 MCG/2ML IJ SOLN: 25.0000 ug | INTRAMUSCULAR | Status: DC | PRN

## 2023-09-24 MED ORDER — PROPOFOL 10 MG/ML IV BOLUS
INTRAVENOUS | Status: DC | PRN
  Administered 2023-09-24: 150 mg via INTRAVENOUS

## 2023-09-24 MED ORDER — MIDAZOLAM HCL 2 MG/2ML IJ SOLN
INTRAMUSCULAR | Status: DC | PRN
Start: 2023-09-24 — End: 2023-09-24
  Administered 2023-09-24: 2 mg via INTRAVENOUS

## 2023-09-24 MED ORDER — FENTANYL CITRATE (PF) 100 MCG/2ML IJ SOLN
INTRAMUSCULAR | Status: AC
Start: 2023-09-24 — End: 2023-09-24
  Filled 2023-09-24: qty 2

## 2023-09-24 MED ORDER — MIDAZOLAM HCL 2 MG/2ML IJ SOLN
INTRAMUSCULAR | Status: AC
  Filled 2023-09-24: qty 2

## 2023-09-24 MED ORDER — DEXAMETHASONE SODIUM PHOSPHATE 10 MG/ML IJ SOLN
INTRAMUSCULAR | Status: AC
  Filled 2023-09-24: qty 1

## 2023-09-24 MED ORDER — AMISULPRIDE (ANTIEMETIC) 5 MG/2ML IV SOLN: 10.0000 mg | Freq: Once | INTRAVENOUS | Status: DC | PRN

## 2023-09-24 SURGICAL SUPPLY — 38 items
BINDER BREAST 3XL (GAUZE/BANDAGES/DRESSINGS) IMPLANT
BINDER BREAST LRG (GAUZE/BANDAGES/DRESSINGS) IMPLANT
BINDER BREAST MEDIUM (GAUZE/BANDAGES/DRESSINGS) IMPLANT
BINDER BREAST XLRG (GAUZE/BANDAGES/DRESSINGS) IMPLANT
BINDER BREAST XXLRG (GAUZE/BANDAGES/DRESSINGS) IMPLANT
BLADE SURG 15 STRL LF DISP TIS (BLADE) ×1 IMPLANT
CANISTER SUC SOCK COL 7IN (MISCELLANEOUS) IMPLANT
CANISTER SUCT 1200ML W/VALVE (MISCELLANEOUS) IMPLANT
CHLORAPREP W/TINT 26 (MISCELLANEOUS) ×1 IMPLANT
CLIP APPLIE 9.375 MED OPEN (MISCELLANEOUS) IMPLANT
COVER BACK TABLE 60X90IN (DRAPES) ×1 IMPLANT
COVER MAYO STAND STRL (DRAPES) ×1 IMPLANT
COVER PROBE CYLINDRICAL 5X96 (MISCELLANEOUS) ×1 IMPLANT
DERMABOND ADVANCED .7 DNX12 (GAUZE/BANDAGES/DRESSINGS) ×1 IMPLANT
DRAPE LAPAROSCOPIC ABDOMINAL (DRAPES) ×1 IMPLANT
DRAPE UTILITY XL STRL (DRAPES) ×1 IMPLANT
ELECTRODE REM PT RTRN 9FT ADLT (ELECTROSURGICAL) ×1 IMPLANT
GAUZE SPONGE 4X4 12PLY STRL LF (GAUZE/BANDAGES/DRESSINGS) IMPLANT
GLOVE SURG SIGNA 7.5 PF LTX (GLOVE) ×1 IMPLANT
GOWN STRL REUS W/ TWL LRG LVL3 (GOWN DISPOSABLE) ×1 IMPLANT
GOWN STRL REUS W/ TWL XL LVL3 (GOWN DISPOSABLE) ×1 IMPLANT
KIT MARKER MARGIN INK (KITS) ×1 IMPLANT
NDL HYPO 25X1 1.5 SAFETY (NEEDLE) ×1 IMPLANT
NEEDLE HYPO 25X1 1.5 SAFETY (NEEDLE) ×1 IMPLANT
NS IRRIG 1000ML POUR BTL (IV SOLUTION) IMPLANT
PACK BASIN DAY SURGERY FS (CUSTOM PROCEDURE TRAY) ×1 IMPLANT
PENCIL SMOKE EVACUATOR (MISCELLANEOUS) ×1 IMPLANT
SLEEVE SCD COMPRESS KNEE MED (STOCKING) ×1 IMPLANT
SPIKE FLUID TRANSFER (MISCELLANEOUS) IMPLANT
SPONGE T-LAP 4X18 ~~LOC~~+RFID (SPONGE) ×1 IMPLANT
SUT MNCRL AB 4-0 PS2 18 (SUTURE) ×1 IMPLANT
SUT SILK 2 0 SH (SUTURE) IMPLANT
SUT VIC AB 3-0 SH 27X BRD (SUTURE) ×1 IMPLANT
SYR CONTROL 10ML LL (SYRINGE) ×1 IMPLANT
TOWEL GREEN STERILE FF (TOWEL DISPOSABLE) ×1 IMPLANT
TRAY FAXITRON CT DISP (TRAY / TRAY PROCEDURE) ×1 IMPLANT
TUBE CONNECTING 20X1/4 (TUBING) IMPLANT
YANKAUER SUCT BULB TIP NO VENT (SUCTIONS) IMPLANT

## 2023-09-24 NOTE — Anesthesia Procedure Notes (Signed)
 Procedure Name: LMA Insertion Date/Time: 09/24/2023 1:36 PM  Performed by: Burnard Rosaline HERO, CRNAPre-anesthesia Checklist: Patient identified, Emergency Drugs available, Suction available and Patient being monitored Patient Re-evaluated:Patient Re-evaluated prior to induction Oxygen Delivery Method: Circle system utilized Preoxygenation: Pre-oxygenation with 100% oxygen Induction Type: IV induction Ventilation: Mask ventilation without difficulty LMA: LMA inserted LMA Size: 4.0 Number of attempts: 1 Airway Equipment and Method: Bite block Placement Confirmation: positive ETCO2, CO2 detector and breath sounds checked- equal and bilateral Tube secured with: Tape Dental Injury: Teeth and Oropharynx as per pre-operative assessment

## 2023-09-24 NOTE — Interval H&P Note (Signed)
 History and Physical Interval Note: no change in H and P  09/24/2023 12:15 PM  Angela Gibbs  has presented today for surgery, with the diagnosis of RIGHT BREAST CANCER.  The various methods of treatment have been discussed with the patient and family. After consideration of risks, benefits and other options for treatment, the patient has consented to  Procedure(s) with comments: BREAST LUMPECTOMY WITH RADIOACTIVE SEED LOCALIZATION (Right) - LMA RADIOACTIVE SEED GUIDED RIGHT BREAST LUMPECTOMY as a surgical intervention.  The patient's history has been reviewed, patient examined, no change in status, stable for surgery.  I have reviewed the patient's chart and labs.  Questions were answered to the patient's satisfaction.     [name]

## 2023-09-24 NOTE — Anesthesia Preprocedure Evaluation (Addendum)
 Anesthesia Evaluation  Patient identified by MRN, date of birth, ID band Patient awake    Reviewed: Allergy & Precautions, NPO status , Patient's Chart, lab work & pertinent test results, reviewed documented beta blocker date and time   Airway Mallampati: I  TM Distance: >3 FB Neck ROM: Full    Dental no notable dental hx. (+) Teeth Intact, Dental Advisory Given   Pulmonary asthma  Restrictive lung disease   Pulmonary exam normal breath sounds clear to auscultation       Cardiovascular hypertension, Pt. on home beta blockers and Pt. on medications + DOE  Normal cardiovascular exam Rhythm:Regular Rate:Normal  TTE 2023  1. Left ventricular ejection fraction, by estimation, is 60 to 65%. Left  ventricular ejection fraction by 3D volume is 62 %. The left ventricle has  normal function. The left ventricle has no regional wall motion  abnormalities. Left ventricular diastolic   parameters were normal.   2. Right ventricular systolic function is normal. The right ventricular  size is normal. There is moderately elevated pulmonary artery systolic  pressure. The estimated right ventricular systolic pressure is 45.5 mmHg.   3. Left atrial size was mildly dilated.   4. The mitral valve is grossly normal. Trivial mitral valve  regurgitation.   5. The aortic valve is tricuspid. There is mild calcification of the  aortic valve. There is mild thickening of the aortic valve. Aortic valve  regurgitation is not visualized. Aortic valve sclerosis/calcification is  present, without any evidence of  aortic stenosis.   6. The inferior vena cava is normal in size with greater than 50%  respiratory variability, suggesting right atrial pressure of 3 mmHg.     Neuro/Psych  Headaches TIA negative psych ROS   GI/Hepatic Neg liver ROS,GERD  ,,  Endo/Other  negative endocrine ROS  Sjogren's  Renal/GU negative Renal ROS  negative genitourinary    Musculoskeletal  (+) Arthritis ,    Abdominal   Peds  Hematology negative hematology ROS (+)   Anesthesia Other Findings   Reproductive/Obstetrics                              Anesthesia Physical Anesthesia Plan  ASA: 3  Anesthesia Plan: General   Post-op Pain Management: Tylenol  PO (pre-op)*   Induction: Intravenous  PONV Risk Score and Plan: 3 and Ondansetron , Dexamethasone  and Treatment may vary due to age or medical condition  Airway Management Planned: LMA  Additional Equipment:   Intra-op Plan:   Post-operative Plan: Extubation in OR  Informed Consent: I have reviewed the patients History and Physical, chart, labs and discussed the procedure including the risks, benefits and alternatives for the proposed anesthesia with the patient or authorized representative who has indicated his/her understanding and acceptance.     Dental advisory given  Plan Discussed with: CRNA  Anesthesia Plan Comments:          Anesthesia Quick Evaluation

## 2023-09-24 NOTE — Transfer of Care (Signed)
 Immediate Anesthesia Transfer of Care Note  Patient: Angela Gibbs  Procedure(s) Performed: BREAST LUMPECTOMY WITH RADIOACTIVE SEED LOCALIZATION (Right: Breast)  Patient Location: PACU  Anesthesia Type:General  Level of Consciousness: awake and alert   Airway & Oxygen Therapy: Patient Spontanous Breathing and Patient connected to face mask oxygen  Post-op Assessment: Report given to RN and Post -op Vital signs reviewed and stable  Post vital signs: Reviewed and stable  Last Vitals:  Vitals Value Taken Time  BP 132/48 09/24/23 14:02  Temp    Pulse 59 09/24/23 14:04  Resp 13 09/24/23 14:04  SpO2 100 % 09/24/23 14:04  Vitals shown include unfiled device data.  Last Pain:  Vitals:   09/24/23 1135  TempSrc: Temporal  PainSc: 0-No pain         Complications: No notable events documented.

## 2023-09-24 NOTE — Discharge Instructions (Addendum)
 [facility] Office Phone Number [phone]  BREAST BIOPSY/ PARTIAL MASTECTOMY: POST OP INSTRUCTIONS  Always review your discharge instruction sheet given to you by the facility where your surgery was performed.  IF YOU HAVE DISABILITY OR FAMILY LEAVE FORMS, YOU MUST BRING THEM TO THE OFFICE FOR PROCESSING.  DO NOT GIVE THEM TO YOUR DOCTOR.  A prescription for pain medication may be given to you upon discharge.  Take your pain medication as prescribed, if needed.  If narcotic pain medicine is not needed, then you may take acetaminophen  (Tylenol ) or ibuprofen (Advil) as needed. Take your usually prescribed medications unless otherwise directed If you need a refill on your pain medication, please contact your pharmacy.  They will contact our office to request authorization.  Prescriptions will not be filled after 5pm or on week-ends. You should eat very light the first 24 hours after surgery, such as soup, crackers, pudding, etc.  Resume your normal diet the day after surgery. Most patients will experience some swelling and bruising in the breast.  Ice packs and a good support bra will help.  Swelling and bruising can take several days to resolve.  It is common to experience some constipation if taking pain medication after surgery.  Increasing fluid intake and taking a stool softener will usually help or prevent this problem from occurring.  A mild laxative (Milk of Magnesia or Miralax) should be taken according to package directions if there are no bowel movements after 48 hours. Unless discharge instructions indicate otherwise, you may remove your bandages 24-48 hours after surgery, and you may shower at that time.  You may have steri-strips (small skin tapes) in place directly over the incision.  These strips should be left on the skin for 7-10 days.  If your surgeon used skin glue on the incision, you may shower in 24 hours.  The glue will flake off over the next 2-3 weeks.  Any  sutures or staples will be removed at the office during your follow-up visit. ACTIVITIES:  You may resume regular daily activities (gradually increasing) beginning the next day.  Wearing a good support bra or sports bra minimizes pain and swelling.  You may have sexual intercourse when it is comfortable. You may drive when you no longer are taking prescription pain medication, you can comfortably wear a seatbelt, and you can safely maneuver your car and apply brakes. RETURN TO WORK:  ______________________________________________________________________________________ Rosine should see your doctor in the office for a follow-up appointment approximately two weeks after your surgery.  Your doctor's nurse will typically make your follow-up appointment when she calls you with your pathology report.  Expect your pathology report 2-3 business days after your surgery.  You may call to check if you do not hear from us  after three days. OTHER INSTRUCTIONS: YOU MAY SHOWER STARTING TOMORROW ICE PACK, TYLENOL , AND IBUPROFEN ALSO FOR PAIN NO VIGOROUS ACTIVITY FOR ONE WEEK _______________________________________________________________________________________________ _____________________________________________________________________________________________________________________________________ _____________________________________________________________________________________________________________________________________ _____________________________________________________________________________________________________________________________________  WHEN TO CALL YOUR DOCTOR: Fever over 101.0 Nausea and/or vomiting. Extreme swelling or bruising. Continued bleeding from incision. Increased pain, redness, or drainage from the incision.  The clinic staff is available to answer your questions during regular business hours.  Please don't hesitate to call and ask to speak to one of the nurses for clinical  concerns.  If you have a medical emergency, go to the nearest emergency room or call 911.  A surgeon from [facility] is always on call at the hospital.  For further questions, please visit  [email or web address]   No Tylenol  before 5:40pm tonight.   Post Anesthesia Home Care Instructions  Activity: Get plenty of rest for the remainder of the day. A responsible individual must stay with you for 24 hours following the procedure.  For the next 24 hours, DO NOT: -Drive a car -Advertising copywriter -Drink alcoholic beverages -Take any medication unless instructed by your physician -Make any legal decisions or sign important papers.  Meals: Start with liquid foods such as gelatin or soup. Progress to regular foods as tolerated. Avoid greasy, spicy, heavy foods. If nausea and/or vomiting occur, drink only clear liquids until the nausea and/or vomiting subsides. Call your physician if vomiting continues.  Special Instructions/Symptoms: Your throat may feel dry or sore from the anesthesia or the breathing tube placed in your throat during surgery. If this causes discomfort, gargle with warm salt water. The discomfort should disappear within 24 hours.  If you had a scopolamine patch placed behind your ear for the management of post- operative nausea and/or vomiting:  1. The medication in the patch is effective for 72 hours, after which it should be removed.  Wrap patch in a tissue and discard in the trash. Wash hands thoroughly with soap and water. 2. You may remove the patch earlier than 72 hours if you experience unpleasant side effects which may include dry mouth, dizziness or visual disturbances. 3. Avoid touching the patch. Wash your hands with soap and water after contact with the patch.

## 2023-09-24 NOTE — Anesthesia Postprocedure Evaluation (Signed)
 Anesthesia Post Note  Patient: Angela Gibbs  Procedure(s) Performed: BREAST LUMPECTOMY WITH RADIOACTIVE SEED LOCALIZATION (Right: Breast)     Patient location during evaluation: PACU Anesthesia Type: General Level of consciousness: awake and alert Pain management: pain level controlled Vital Signs Assessment: post-procedure vital signs reviewed and stable Respiratory status: spontaneous breathing, nonlabored ventilation and respiratory function stable Cardiovascular status: blood pressure returned to baseline and stable Postop Assessment: no apparent nausea or vomiting Anesthetic complications: no   No notable events documented.  Last Vitals:  Vitals:   09/24/23 1430 09/24/23 1445  BP: (!) 139/54 (!) 160/66  Pulse: (!) 59 60  Resp: 15 16  Temp:  (!) 36.3 C  SpO2: 94% 97%    Last Pain:  Vitals:   09/24/23 1445  TempSrc:   PainSc: 0-No pain                 Butler Levander Pinal

## 2023-09-24 NOTE — Op Note (Signed)
   Angela Gibbs 09/24/2023   Pre-op Diagnosis: RIGHT BREAST CANCER     Post-op Diagnosis: same  Procedure(s): RIGHT BREAST RADIOACTIVE SEED GUIDED LUMPECTOMY  Surgeon(s): Vernetta Berg, MD  Anesthesia: General  Staff:  Circulator: Mannie Ellouise LABOR, RN Scrub Person: Burroughs, Dagoberto MATSU, RN  Estimated Blood Loss: Minimal               Specimens: sent to path  Indications: This is an 83 year old female recently diagnosed with a mass in her right breast.  A biopsy showed an invasive ductal carcinoma which was ER/PR positive.  The decision was made to proceed with a radioactive seed guided right breast lumpectomy after discussion with the patient and her family  Procedure: The patient was brought to the operating room and identified the correct patient.  She was placed upon on the operating room table and general anesthesia was induced.  Her right breast was prepped and draped in the usual sterile fashion.  Using the neoprobe I located the radioactive seed at the 4 o'clock position of the right breast.  I anesthetized the medial edge of the areola with Marcaine  and then made a circumareolar incision with a scalpel.  I then dissected down to the breast tissue with electrocautery.  I then dissected slightly medially toward the signal from the radioactive seed and then down into the deep breast tissue.  With the aid of the neoprobe I stayed widely around the signal from the radioactive seed dissecting in all directions.  I then came underneath the signal from the right proceed and completed the lumpectomy with the cautery.  Once the lumpectomy was removed I marked all margins with paint.  An x-ray was performed on the specimen confirming the radioactive seed and previous biopsy clip were in the specimen.  The specimen was then sent to pathology for evaluation.  I achieved hemostasis with the cautery in the lumpectomy cavity.  I injected it further with Marcaine .  I then placed surgical clips  around the periphery of the lumpectomy cavity for marking purposes.  I then closed the subcutaneous tissue with interrupted 3-0 Vicryl sutures and closed the skin with a running 4-0 Monocryl.  Dermabond was then applied.  The patient tolerated the procedure well.  All the counts were correct at the end of the procedure.  The patient was then extubated in the operating room and taken in a stable condition to the recovery room.          Berg Vernetta   Date: 09/24/2023  Time: 1:54 PM

## 2023-09-25 ENCOUNTER — Encounter (HOSPITAL_BASED_OUTPATIENT_CLINIC_OR_DEPARTMENT_OTHER): Payer: Self-pay | Admitting: Surgery

## 2023-09-28 LAB — SURGICAL PATHOLOGY

## 2023-10-12 NOTE — Progress Notes (Signed)
   PROVIDER:  VICENTA DASIE POLI, MD  MRN: [id number] DOB: 30-Jun-1940 DATE OF ENCOUNTER: 10/12/2023 Interval History:     She is here for her postoperative visit status post radioactive seed guided right breast lumpectomy.  Her husband is with her.  Other than pain from surgery, she reports she is doing very well and has no complaints    Physical Examination:   Physical Exam   A chaperone was present for the examination  She appears well on examination.  The right breast incision is healing well without evidence of infection.  The final pathology showed the invasive ductal carcinoma with DCIS.  The invasive cancer measuring 1 cm.  It was ER/PR positive.  Margins were negative   Assessment and Plan:     Angela Gibbs is a 83 y.o. female who underwent radioactive seed guided right breast lumpectomy on 09/24/2023.  Diagnoses and all orders for this visit:  Postop check     She is doing well postoperatively.  I gave her and her husband a copy of the pathology results we discussed them.  She has appointments with radiation and medical oncology coming up.  She is likely to forego radiation therapy despite the risk reduction benefits.  She says she would at least do antihormonal therapy.  My plan will be to see her back in 6 months unless there are any issues.  She may resume her normal activity.    Return in about 6 months (around 04/13/2024).   The plan was discussed in detail with the patient today, who expressed understanding.  The patient has my contact information, and understands to call me with any additional questions or concerns in the interval.  I would be happy to see the patient back sooner if the need arises.   VICENTA DASIE POLI, MD

## 2023-10-13 NOTE — Progress Notes (Signed)
 Location of Breast Cancer:{:21944}   Histology per Pathology Report:    Receptor Status: ER(Positive), PR (Positive), Her2-neu (Negative), Ki-67(5%)  Did patient present with symptoms (if so, please note symptoms) or was this found on screening mammography?: ***  Past/Anticipated interventions by surgeon, if any: 09/24/2023 Vernetta, MD Right Breast Lumpectomy with Radioactive Seed Localization   Past/Anticipated interventions by medical oncology, if any: Chemotherapy ***  Lymphedema issues, if any:  {:18581} {t:21944}   Pain issues, if any:  {:18581} {PAIN DESCRIPTION:21022940}  SAFETY ISSUES: Prior radiation? {:18581} Pacemaker/ICD? {:18581} Possible current pregnancy?{:18581} Is the patient on methotrexate? {:18581}  Current Complaints / other details:  ***

## 2023-10-15 NOTE — Progress Notes (Signed)
 Radiation Oncology         [phone] ________________________________  Name: Angela Gibbs MRN: [id number]  Date: 10/16/2023  DOB: 1940-03-15  Follow-Up Visit Note  Outpatient  CC: Angela Gibbs, Angela Gibbs BROCKS, MD  Vernetta Berg, MD  Diagnosis:      ICD-10-CM   1. Carcinoma of lower-inner quadrant of right breast in female, estrogen receptor positive (HCC)  C50.311    Z17.0        Stage IA (cT1b, cN0, cM0) Right Breast LIQ, Invasive and in situ ductal carcinoma, ER+ / PR+ / Her2- (by IHC), Grade 2: s/p right breast lumpectomy without nodal excisions    Cancer Staging  Carcinoma of lower-inner quadrant of right breast in female, estrogen receptor positive (HCC) Staging form: Breast, AJCC 8th Edition - Clinical stage from 09/07/2023: Stage IA (cT1b, cN0, cM0, G2, ER+, PR+, HER2-) - Signed by Izell Domino, MD on 09/07/2023 pT1b, pN notassigned   CHIEF COMPLAINT: Here to discuss management of right breast cancer  Narrative:  The patient returns today for follow-up.     Since her consultation date of 09/07/23, she opted to proceed with a right breast lumpectomy without nodal biopsies on 09/24/23 under the care of Dr. Vernetta. Pathology from the procedure revealed: tumor size of 1 cm; histology of grade 2 invasive ductal with intermediate grade DCIS; negative for LVI; all margins negative for invasive and in situ carcinoma; margin status to invasive disease of 4 mm from the posterior margin; margin status to in situ disease of 6 mm from the posterior margin; no lymph nodes were examined;  ER status: 95% positive with strong staining intensity; PR status 90% positive with moderate-strong staining intensity; Proliferation marker Ki67 at 5%; Her2 status negative by IHC; Grade 2.  She has yet to be seen in consultation by medical oncology.  It appears that she canceled her appointment with Dr. Lanny in late July   Symptomatically, the patient reports: she is doing well        ALLERGIES:   is allergic to cephalosporins; ciprofloxacin; influenza virus vaccine; keflex  [cephalexin ]; latex; levaquin [levofloxacin]; nitrofuran derivatives; penicillins; sulfacetamide sodium; tape; xopenex  [levalbuterol  hcl]; zostavax [zoster vaccine live]; erythromycin; hydrochlorothiazide ; lisinopril; petrolatum; pregabalin; ropinirole  hcl; shellfish allergy; sulfa antibiotics; golimumab ; ibuprofen; lactose; aspirin; avelox [moxifloxacin hcl in nacl]; cefdinir; chromium; egg solids, whole; egg-derived products; iodinated contrast media; lactose intolerance (gi); percodan [oxycodone -aspirin]; prednisone ; and troleandomycin.  Meds: Current Outpatient Medications  Medication Sig Dispense Refill   ALPRAZolam  (XANAX ) 0.25 MG tablet TAKE 1 TABLET(0.25 MG) BY MOUTH TWICE DAILY AS NEEDED FOR ANXIETY 30 tablet 0   Ascorbic Acid (VITAMIN C) 1000 MG tablet Take 1,000 mg by mouth daily.     atorvastatin  (LIPITOR) 10 MG tablet Take 1 tablet (10 mg total) by mouth daily. 90 tablet 3   butalbital-acetaminophen -caffeine (FIORICET) 50-325-40 MG tablet Take 1-2 tablets by mouth 2 (two) times daily as needed for headache (or leg pain).     Cholecalciferol (VITAMIN D3) 125 MCG (5000 UT) CAPS Take 5,000 Units by mouth daily.     FLUoxetine  (PROZAC ) 20 MG capsule Take 1 capsule (20 mg total) by mouth daily. 90 capsule 1   ibuprofen (ADVIL) 200 MG tablet Take 200 mg by mouth every 6 (six) hours as needed.     MAGNESIUM GLYCINATE PO Take 50 mg by mouth daily.     montelukast  (SINGULAIR ) 10 MG tablet TAKE 1 TABLET(10 MG) BY MOUTH AT BEDTIME 30 tablet 2   nebivolol  (BYSTOLIC ) 10 MG tablet Take  1 tablet (10 mg total) by mouth daily. 90 tablet 1   NP THYROID  30 MG tablet Take 30 mg by mouth daily before breakfast.     pantoprazole  (PROTONIX ) 40 MG tablet Take 1 tablet (40 mg total) by mouth daily. 30 tablet 1   Prednicarbate 0.1 % CREA Apply 1 Application topically as needed for itching (affected sites).     REFRESH TEARS PF 0.5-0.9  % SOLN Place 1 drop into both eyes 3 (three) times daily as needed (for irritation).     sodium chloride  (V-R NASAL SPRAY SALINE) 0.65 % nasal spray Place 1 spray into the nose as needed. Use in both nostrils twice daily 30 mL 6   spironolactone  (ALDACTONE ) 25 MG tablet TAKE 1/2 TABLET(12.5 MG) BY MOUTH DAILY 30 tablet 3   traMADol  (ULTRAM ) 50 MG tablet Take 1 tablet (50 mg total) by mouth every 6 (six) hours as needed for moderate pain (pain score 4-6) or severe pain (pain score 7-10). 20 tablet 0   valsartan  (DIOVAN ) 320 MG tablet TAKE 1 TABLET(320 MG) BY MOUTH DAILY 90 tablet 3   No current facility-administered medications for this visit.    Physical Findings:  vitals were not taken for this visit. .     General: Alert and oriented, in no acute distress HEENT: Head is normocephalic. Extraocular movements are intact.  Musculoskeletal: symmetric strength and muscle tone throughout. Neurologic: No obvious focalities. Speech is fluent.  Psychiatric: Judgment and insight are intact. Affect is appropriate. Breast exam reveals excellent healing, Right lumpectomy site  Lab Findings: Lab Results  Component Value Date   WBC 5.7 09/07/2023   HGB 12.7 09/07/2023   HCT 37.9 09/07/2023   MCV 91.5 09/07/2023   PLT 263.0 09/07/2023    @LASTCHEMISTRY @  Radiographic Findings: MM Breast Surgical Specimen Result Date: 09/24/2023 CLINICAL DATA:  Evaluate surgical specimen following lumpectomy for RIGHT breast cancer. EXAM: SPECIMEN RADIOGRAPH OF THE RIGHT BREAST COMPARISON:  Previous exam(s). FINDINGS: Status post excision of the RIGHT breast. The radioactive seed and COIL biopsy clip are present and intact. IMPRESSION: Specimen radiograph of the RIGHT breast. Electronically Signed   By: Angela Gibbs M.D.   On: 09/24/2023 15:40   MM RT RADIOACTIVE SEED LOC MAMMO GUIDE Result Date: 09/22/2023 CLINICAL DATA:  83 year old female presenting for seed localization of the right breast. Patient has newly  diagnosed right breast invasive ductal carcinoma. EXAM: MAMMOGRAPHIC GUIDED RADIOACTIVE SEED LOCALIZATION OF THE RIGHT BREAST COMPARISON:  Previous exam(s). FINDINGS: Patient presents for radioactive seed localization prior to right breast lumpectomy. I met with the patient and we discussed the procedure of seed localization including benefits and alternatives. We discussed the high likelihood of a successful procedure. We discussed the risks of the procedure including infection, bleeding, tissue injury and further surgery. We discussed the low dose of radioactivity involved in the procedure. Informed, written consent was given. The usual time-out protocol was performed immediately prior to the procedure. Using mammographic guidance, sterile technique, 1% lidocaine  and an I-125 radioactive seed, the coil biopsy marking clip in the right breast was localized using a medial approach. The follow-up mammogram images confirm the seed in the expected location and were marked for Dr. Vernetta. Follow-up survey of the patient confirms presence of the radioactive seed. Order number of I-125 seed:  [id number]. Total activity: 0.241 mCi reference Date: 08/31/2023 The patient tolerated the procedure well and was [facility]. She was given instructions regarding seed removal. IMPRESSION: Radioactive seed localization right breast.  No apparent complications. Electronically Signed   By: Inocente Ast M.D.   On: 09/22/2023 10:18    Impression/Plan: Carcinoma of lower-inner quadrant of right breast in female, estrogen receptor positive (HCC)   We discussed adjuvant radiotherapy today.  I recommend hypofractionated radiation therapy in order to decrease her risk of recurrent breast cancer in the Right breast.  We again discussed the option of standard hypofractionation (15 treatments) and ultrahypofractionation (5 treatments).  I reviewed the logistics, benefits, risks, and potential side effects of these  treatments in detail. Risks may include but not necessary be limited to acute and late injury tissue in the radiation fields such as skin irritation (change in color/pigmentation, itching, dryness, pain, peeling). She may experience fatigue. We also discussed possible risk of long term cosmetic changes, swelling, hardening of the breast, or scar tissue. There is also risk for lung toxicity, brachial plexopathy, lymphedema, musculoskeletal changes, rib fragility or induction of a second malignancy, and late chronic non-healing soft tissue wound with either technique.    The patient asked good questions which I answered to her satisfaction. She is enthusiastic about proceeding with treatment. A consent form has been signed and placed in her chart.  The patient understands that ultra hypofractionation is not as standard or quite as well studied, but clinical trials thus far have demonstrated promising results for patients with her type of breast cancer. She wishes to pursue this technique because it will be significantly more convenient. She will receive 28.5 Gy in 5 fractions given at a frequency of once weekly to the right breast breast starting in 1.5 wks.  CT simulation will be today.  Referral back to med/onc as her previous consult was cancelled.  She states she would like to pursue RT alone but I encouraged her to still talk with Dr. Lanny and benefit from her expertise.   On date of service, in total, I spent 40 minutes on this encounter. Patient was seen in person.  Note signed after encounter date; minutes pertain to date of service, only.  _____________________________________   Lauraine Golden, MD  This document serves as a record of services personally performed by Lauraine Golden, MD. It was created on her behalf by Dorthy Fuse, a trained medical scribe. The creation of this record is based on the scribe's personal observations and the provider's statements to them. This document has been  checked and approved by the attending provider.

## 2023-10-16 ENCOUNTER — Ambulatory Visit
Admission: RE | Admit: 2023-10-16 | Discharge: 2023-10-16 | Disposition: A | Discharge status: Home / Self Care | Source: Ambulatory Visit | Attending: Radiation Oncology | Admitting: Radiation Oncology

## 2023-10-16 VITALS — Ht 63.0 in | Wt 179.1 lb

## 2023-10-16 DIAGNOSIS — C50311 Malignant neoplasm of lower-inner quadrant of right female breast: Secondary | ICD-10-CM | POA: Diagnosis not present

## 2023-10-16 DIAGNOSIS — Z170 Estrogen receptor positive status [ER+]: Secondary | ICD-10-CM

## 2023-10-16 DIAGNOSIS — C50211 Malignant neoplasm of upper-inner quadrant of right female breast: Secondary | ICD-10-CM | POA: Diagnosis not present

## 2023-10-21 DIAGNOSIS — M064 Inflammatory polyarthropathy: Secondary | ICD-10-CM | POA: Diagnosis not present

## 2023-10-21 DIAGNOSIS — M1990 Unspecified osteoarthritis, unspecified site: Secondary | ICD-10-CM | POA: Diagnosis not present

## 2023-10-21 DIAGNOSIS — M533 Sacrococcygeal disorders, not elsewhere classified: Secondary | ICD-10-CM | POA: Diagnosis not present

## 2023-10-21 DIAGNOSIS — M51361 Other intervertebral disc degeneration, lumbar region with lower extremity pain only: Secondary | ICD-10-CM | POA: Diagnosis not present

## 2023-10-21 DIAGNOSIS — M461 Sacroiliitis, not elsewhere classified: Secondary | ICD-10-CM | POA: Diagnosis not present

## 2023-10-21 DIAGNOSIS — Z7409 Other reduced mobility: Secondary | ICD-10-CM | POA: Diagnosis not present

## 2023-10-21 DIAGNOSIS — M7061 Trochanteric bursitis, right hip: Secondary | ICD-10-CM | POA: Diagnosis not present

## 2023-10-22 ENCOUNTER — Encounter: Payer: Self-pay | Admitting: *Deleted

## 2023-10-22 DIAGNOSIS — C50311 Malignant neoplasm of lower-inner quadrant of right female breast: Secondary | ICD-10-CM

## 2023-10-23 ENCOUNTER — Telehealth: Payer: Self-pay | Admitting: Hematology

## 2023-10-23 ENCOUNTER — Ambulatory Visit
Admission: RE | Admit: 2023-10-23 | Discharge: 2023-10-23 | Disposition: A | Discharge status: Home / Self Care | Source: Ambulatory Visit | Attending: Radiation Oncology | Admitting: Radiation Oncology

## 2023-10-23 DIAGNOSIS — Z170 Estrogen receptor positive status [ER+]: Secondary | ICD-10-CM | POA: Diagnosis not present

## 2023-10-23 DIAGNOSIS — C50311 Malignant neoplasm of lower-inner quadrant of right female breast: Secondary | ICD-10-CM | POA: Insufficient documentation

## 2023-10-23 DIAGNOSIS — C50211 Malignant neoplasm of upper-inner quadrant of right female breast: Secondary | ICD-10-CM | POA: Diagnosis not present

## 2023-10-23 NOTE — Telephone Encounter (Signed)
 LVM  to pt regarding  her upcoming appt.

## 2023-10-26 ENCOUNTER — Ambulatory Visit
Admission: RE | Admit: 2023-10-26 | Discharge: 2023-10-26 | Disposition: A | Discharge status: Home / Self Care | Source: Ambulatory Visit | Attending: Radiation Oncology | Admitting: Radiation Oncology

## 2023-10-26 ENCOUNTER — Other Ambulatory Visit: Payer: Self-pay

## 2023-10-26 DIAGNOSIS — C50211 Malignant neoplasm of upper-inner quadrant of right female breast: Secondary | ICD-10-CM | POA: Diagnosis not present

## 2023-10-26 DIAGNOSIS — Z170 Estrogen receptor positive status [ER+]: Secondary | ICD-10-CM | POA: Diagnosis not present

## 2023-10-26 DIAGNOSIS — Z510 Encounter for antineoplastic radiation therapy: Secondary | ICD-10-CM | POA: Diagnosis not present

## 2023-10-26 DIAGNOSIS — C50311 Malignant neoplasm of lower-inner quadrant of right female breast: Secondary | ICD-10-CM | POA: Diagnosis not present

## 2023-10-26 LAB — RAD ONC ARIA SESSION SUMMARY
Course Elapsed Days: 0
Plan Fractions Treated to Date: 1
Plan Prescribed Dose Per Fraction: 5.7 Gy
Plan Total Fractions Prescribed: 5
Plan Total Prescribed Dose: 28.5 Gy
Reference Point Dosage Given to Date: 5.7 Gy
Reference Point Session Dosage Given: 5.7 Gy
Session Number: 1

## 2023-11-02 ENCOUNTER — Ambulatory Visit

## 2023-11-02 ENCOUNTER — Other Ambulatory Visit: Payer: Self-pay

## 2023-11-02 ENCOUNTER — Ambulatory Visit
Admission: RE | Admit: 2023-11-02 | Discharge: 2023-11-02 | Disposition: A | Discharge status: Home / Self Care | Source: Ambulatory Visit | Attending: Radiation Oncology | Admitting: Radiation Oncology

## 2023-11-02 DIAGNOSIS — C50311 Malignant neoplasm of lower-inner quadrant of right female breast: Secondary | ICD-10-CM | POA: Diagnosis not present

## 2023-11-02 DIAGNOSIS — C50211 Malignant neoplasm of upper-inner quadrant of right female breast: Secondary | ICD-10-CM | POA: Diagnosis not present

## 2023-11-02 DIAGNOSIS — Z170 Estrogen receptor positive status [ER+]: Secondary | ICD-10-CM | POA: Diagnosis not present

## 2023-11-02 DIAGNOSIS — Z510 Encounter for antineoplastic radiation therapy: Secondary | ICD-10-CM | POA: Diagnosis not present

## 2023-11-02 LAB — RAD ONC ARIA SESSION SUMMARY
Course Elapsed Days: 7
Plan Fractions Treated to Date: 2
Plan Prescribed Dose Per Fraction: 5.7 Gy
Plan Total Fractions Prescribed: 5
Plan Total Prescribed Dose: 28.5 Gy
Reference Point Dosage Given to Date: 11.4 Gy
Reference Point Session Dosage Given: 5.7 Gy
Session Number: 2

## 2023-11-03 ENCOUNTER — Ambulatory Visit

## 2023-11-04 ENCOUNTER — Ambulatory Visit

## 2023-11-05 ENCOUNTER — Ambulatory Visit

## 2023-11-05 ENCOUNTER — Telehealth: Payer: Self-pay | Admitting: Family Medicine

## 2023-11-05 NOTE — Telephone Encounter (Signed)
 Copied from CRM [id number]. Topic: Medicare AWV >> Nov 05, 2023  1:55 PM Nathanel DEL wrote: Reason for CRM: Called 11/05/2023 LVM to reschedule AWV due [facility] on vacation. Please call to [phone] r/s. New AWV appt date 12/15/2023 at 11:10pm. Please confirm date change khc.  Nathanel Paschal; Care Guide Ambulatory Clinical Support [place] [facility] Direct Dial: [phone]

## 2023-11-06 ENCOUNTER — Ambulatory Visit

## 2023-11-09 ENCOUNTER — Ambulatory Visit
Admission: RE | Admit: 2023-11-09 | Discharge: 2023-11-09 | Disposition: A | Discharge status: Home / Self Care | Source: Ambulatory Visit | Attending: Radiation Oncology | Admitting: Radiation Oncology

## 2023-11-09 ENCOUNTER — Other Ambulatory Visit: Payer: Self-pay

## 2023-11-09 ENCOUNTER — Ambulatory Visit

## 2023-11-09 DIAGNOSIS — Z510 Encounter for antineoplastic radiation therapy: Secondary | ICD-10-CM | POA: Diagnosis not present

## 2023-11-09 DIAGNOSIS — C50211 Malignant neoplasm of upper-inner quadrant of right female breast: Secondary | ICD-10-CM | POA: Diagnosis not present

## 2023-11-09 DIAGNOSIS — Z170 Estrogen receptor positive status [ER+]: Secondary | ICD-10-CM | POA: Diagnosis not present

## 2023-11-09 DIAGNOSIS — C50311 Malignant neoplasm of lower-inner quadrant of right female breast: Secondary | ICD-10-CM | POA: Diagnosis not present

## 2023-11-09 LAB — RAD ONC ARIA SESSION SUMMARY
Course Elapsed Days: 14
Plan Fractions Treated to Date: 3
Plan Prescribed Dose Per Fraction: 5.7 Gy
Plan Total Fractions Prescribed: 5
Plan Total Prescribed Dose: 28.5 Gy
Reference Point Dosage Given to Date: 17.1 Gy
Reference Point Session Dosage Given: 5.7 Gy
Session Number: 3

## 2023-11-16 ENCOUNTER — Ambulatory Visit: Admission: RE | Admit: 2023-11-16 | Source: Ambulatory Visit

## 2023-11-16 ENCOUNTER — Ambulatory Visit

## 2023-11-16 ENCOUNTER — Telehealth: Payer: Self-pay

## 2023-11-16 NOTE — Telephone Encounter (Signed)
 Spoke with pts husband, pt would like the upcoming appointment on 11/18/2023.

## 2023-11-16 NOTE — Telephone Encounter (Signed)
 Initial Comment Caller states his wife has restless leg. The pt is taking medication for restless leg, but it is not working. Caller would like to schedule an appointment for tomorrow morning for the pt. Translation No Nurse Assessment Nurse: German, RN, Larraine Date/Time (Eastern Time): 11/16/2023 5:17:58 AM Confirm and document reason for call. If symptomatic, describe symptoms. ---SO states pt has restless leg syndrome. Husband states it has gotten worse at night over the last few days. Husband says pt is now asleep. states she took Benadryl took one pill and alprazolam . Does the patient have any new or worsening symptoms? ---Yes Will a triage be completed? ---Yes Related visit to physician within the last 2 weeks? ---No Does the PT have any chronic conditions? (i.e. diabetes, asthma, this includes High risk factors for pregnancy, etc.) ---Yes List chronic conditions. ---restless leg htn hx of breast cancer, still taking radiation Is this a behavioral health or substance abuse call? ---No Guidelines Guideline Title Affirmed Question Affirmed Notes Nurse Date/Time Titus Time) Leg Pain [1] Thigh, calf, or ankle swelling AND [2] bilateral AND German, RN, Larraine 11/16/2023 5:21:28 AM PLEASE NOTE: All timestamps contained within this report are represented as [place] Standard Time. CONFIDENTIALTY NOTICE: This fax transmission is intended only for the addressee. It contains information that is legally privileged, confidential or otherwise protected from use or disclosure. If you are not the intended recipient, you are strictly prohibited from reviewing, disclosing, copying using or disseminating any of this information or taking any action in reliance on or regarding this information. If you have received this fax in error, please notify us  immediately by telephone so that we can arrange for its return to us . Phone: [phone], Toll-Free: [phone], Fax:  [phone] [email or web address] 1941-01-02 Page: 1 of2 CallId: [id number] Guidelines Guideline Title Affirmed Question Affirmed Notes Nurse Date/Time Titus Time) [3] 1 side is more swollen Disp. Time Titus Time) Disposition Final User 11/16/2023 5:25:51 AM See HCP (or PCP Triage) Within 4 Hours Yes German, RN, Larraine Final Disposition 11/16/2023 5:25:51 AM See HCP (or PCP Triage) Within 4 Hours Yes German, RN, Larraine [place] Disagree/Comply Comply Caller Understands Yes PreDisposition InappropriateToAsk Care Advice Given Per Guideline SEE HCP (OR PCP TRIAGE) WITHIN 4 HOURS: * IF OFFICE WILL BE OPEN: You need to be seen within the next 3 or 4 hours. Call your doctor (or NP/PA) now or as soon as the office opens. CALL EMS 911 IF: * Chest pain or shortness of breath occurs CALL BACK IF: * You become worse CARE ADVICE given per Leg Pain (Adult) guideline. Comments User: Larraine German, RN Date/Time Titus Time): 11/16/2023 5:23:14 AM afebrile Referrals REFERRED TO PCP OFFICE

## 2023-11-17 NOTE — Progress Notes (Addendum)
 [facility] [address] [place], [place] [address] [phone]  Date:  11/18/2023   Name:  Angela Gibbs   DOB:  09-12-1940   MRN:  [id number]  PCP:  Watt [name], MD    Chief Complaint: No chief complaint on file.   History of Present Illness:  Angela Gibbs is a 83 y.o. very pleasant female patient who presents with the following:  Patient seen today with concern of not feeling well-increase symptoms of restless legs.  I saw her most recently in June.  At that time she was planned to have a breast biopsy done for a possible mass in the right breast - history of TIA, restrictive lung disease due to scoliosis and hiatal hernia, psoriatic arthritis, hypertension, iron  deficiency anemia, hypothyroidism, GI bleed in January of this year   Her biopsy did show cancer-invasive ductal carcinoma, she was admitted for lumpectomy in August and is now doing radiation  -She has a stated allergy to flu vaccine and has declined COVID and shingles vaccination Discussed the use of AI scribe software for clinical note transcription with the patient, who gave verbal consent to proceed.  History of Present Illness     Patient Active Problem List   Diagnosis Date Noted   Carcinoma of lower-inner quadrant of right breast in female, estrogen receptor positive (HCC) 09/07/2023   Hyponatremia 03/10/2023   Upper GI bleed 03/06/2023   Mixed dyslipidemia 01/28/2023   Aortic atherosclerosis 01/21/2023   DOE (dyspnea on exertion) 12/25/2022   Eczema 12/24/2022   Fatigue 12/24/2022   Inflammatory polyarthropathy (HCC) 12/24/2022   Joint pain 12/24/2022   Lymphedema 12/24/2022   Sjogren syndrome, unspecified 12/24/2022   Osteopenia 08/12/2022   Dyspnea on exertion 12/25/2021   Obesity (BMI 30.0-34.9) 12/25/2021   Allergy 12/13/2021   Concussion 12/13/2021   Falls 12/13/2021   Head injury 12/13/2021   Herpes 12/13/2021    Migraines 12/13/2021   Nerve damage 12/13/2021   Shingles 12/13/2021   Body mass index (BMI) 31.0-31.9, adult 12/13/2021   IDA (iron  deficiency anemia) 01/11/2020   Positive FIT (fecal immunochemical test) 12/28/2019   Iron  deficiency anemia due to chronic blood loss 12/28/2019   Psoriatic arthritis (HCC) 12/07/2019   Psoriasis 12/07/2019   Osteoarthritis 12/07/2019   TIA (transient ischemic attack) 05/18/2019   Restrictive lung disease 05/13/2016   Moderate persistent asthma without complication 05/13/2016   Current use of beta blocker 04/11/2016   Other allergic rhinitis 04/11/2016   Gastroesophageal reflux disease without esophagitis 04/11/2016   Essential hypertension 02/20/2016   Chalazion of right upper eyelid 06/06/2014   Nuclear sclerosis of both eyes 06/06/2014   PVD (posterior vitreous detachment), both eyes 03/19/2014   Retinal tear of right eye 02/13/2014   Abdominal pain, acute, right upper quadrant 07/29/2013   Cataracts, bilateral 06/06/2013   Eyelid lesion 06/06/2013    Past Medical History:  Diagnosis Date   Abdominal pain, acute, right upper quadrant 07/29/2013   Allergy    Aortic atherosclerosis 01/21/2023   Body mass index (BMI) 31.0-31.9, adult 12/13/2021   Cataracts, bilateral 06/06/2013   Chalazion of right upper eyelid 06/06/2014   Concussion    Current use of beta blocker 04/11/2016   DOE (dyspnea on exertion) 12/25/2022   Dyspnea on exertion 12/25/2021   Eczema 12/24/2022   Essential hypertension 02/20/2016   Eyelid lesion 06/06/2013   Falls    Fatigue 12/24/2022   Gastroesophageal reflux  disease without esophagitis 04/11/2016   Head injury    Herpes    IDA (iron  deficiency anemia) 01/11/2020   Inflammatory polyarthropathy (HCC) 12/24/2022   Iron  deficiency anemia due to chronic blood loss 12/28/2019   Joint pain 12/24/2022   Lymphedema 12/24/2022   Migraines    Moderate persistent asthma without complication 05/13/2016   Nerve damage     Nuclear sclerosis of both eyes 06/06/2014   Obesity (BMI 30.0-34.9) 12/25/2021   Osteoarthritis 12/07/2019   Osteopenia 08/12/2022   Other allergic rhinitis 04/11/2016   Positive FIT (fecal immunochemical test) 12/28/2019   Psoriasis 12/07/2019   Psoriatic arthritis (HCC) 12/07/2019   Managed by rheumatology   PVD (posterior vitreous detachment), both eyes 03/19/2014   Restrictive lung disease 05/13/2016   Related to scoliosis and hiatal hernia   Retinal tear of right eye 02/13/2014   Shingles    Sjogren syndrome, unspecified 12/24/2022   TIA (transient ischemic attack) 05/18/2019    Past Surgical History:  Procedure Laterality Date   BIOPSY  03/12/2023   Procedure: BIOPSY;  Surgeon: Elicia Claw, MD;  Location: [facility];  Service: Gastroenterology;;   BRAIN SURGERY     for trigemina neuralgia   BREAST BIOPSY Right 08/18/2023   US  RT BREAST BX W LOC DEV 1ST LESION IMG BX SPEC US  GUIDE 08/18/2023 [facility]   BREAST BIOPSY  09/22/2023   MM RT RADIOACTIVE SEED LOC MAMMO GUIDE 09/22/2023 [facility]   BREAST LUMPECTOMY WITH RADIOACTIVE SEED LOCALIZATION Right 09/24/2023   Procedure: BREAST LUMPECTOMY WITH RADIOACTIVE SEED LOCALIZATION;  Surgeon: Vernetta Berg, MD;  Location: [facility];  Service: General;  Laterality: Right;  LMA RADIOACTIVE SEED GUIDED RIGHT BREAST LUMPECTOMY   CRANIOTOMY FOR EXTRACRANIAL-INTRACRANIAL BYPASS     ESOPHAGOGASTRODUODENOSCOPY (EGD) WITH PROPOFOL  N/A 03/12/2023   Procedure: ESOPHAGOGASTRODUODENOSCOPY (EGD) WITH PROPOFOL ;  Surgeon: Elicia Claw, MD;  Location: [facility];  Service: Gastroenterology;  Laterality: N/A;   EYE SURGERY  05/22/2015   milia excision   HIP SURGERY     intracranial surgery   NERVE SURGERY     ROOT CANAL     TONSILLECTOMY      Social History   Tobacco Use   Smoking status: Never   Smokeless tobacco: Never  Vaping Use   Vaping status: Never Used  Substance Use Topics    Alcohol use: Not Currently    Comment: social   Drug use: No    Family History  Problem Relation Age of Onset   Heart disease Father    Heart attack Father     Allergies  Allergen Reactions   Cephalosporins Shortness Of Breath and Other (See Comments)    Breathing difficulty   Ciprofloxacin Hives and Shortness Of Breath   Influenza Virus Vaccine Other (See Comments)    Numbness in legs   Keflex  [Cephalexin ] Hives   Latex Hives, Itching and Rash   Levaquin [Levofloxacin] Hives, Palpitations and Other (See Comments)    Chest pain   Nitrofuran Derivatives Hives, Itching and Swelling   Penicillins Hives   Sulfacetamide Sodium Hives   Tape Itching and Other (See Comments)    RED AND IRRITATED SKIN   Xopenex  [Levalbuterol  Hcl] Anxiety and Other (See Comments)    Tremors with violent body chattering and shaking   Zostavax [Zoster Vaccine Live] Other (See Comments)    Pt has been told by specialists not to receive vaccine d/t h/o herpes in bloodstream and shingles.   Erythromycin Other (See Comments)  stomach issues   Hydrochlorothiazide  Other (See Comments)    Migraine   Lisinopril Cough   Petrolatum Nausea Only   Pregabalin Other (See Comments)   Ropinirole  Hcl Nausea And Vomiting   Shellfish Allergy Hives   Sulfa Antibiotics Hives   Golimumab  Other (See Comments)    Felt poorly (Simponi )   Ibuprofen Other (See Comments)    Stomach upset   Lactose Other (See Comments)    stomach issues  Other Reaction(s): GI Intolerance   Aspirin Other (See Comments)    Stomach pain/burning   Avelox [Moxifloxacin Hcl In Nacl] Nausea Only   Cefdinir Nausea And Vomiting   Chromium Other (See Comments)    Blisters   Egg Solids, Whole Other (See Comments)    Per allergy testing, no known reaction. Pt states she was told she could probably eat 2-3 eggs weekly w/o issue.   Egg-Derived Products     Per allergy testing, no known reaction. Pt states she was told she could probably  eat 2-3 eggs weekly w/o issue.   Iodinated Contrast Media Other (See Comments)    unknown    Lactose Intolerance (Gi) Other (See Comments)    stomach issues   Percodan [Oxycodone -Aspirin] Nausea And Vomiting   Prednisone  Palpitations    No injections, can do the pills   Troleandomycin Other (See Comments)    Unknown reaction    Medication list has been reviewed and updated.  Current Outpatient Medications on File Prior to Visit  Medication Sig Dispense Refill   ALPRAZolam  (XANAX ) 0.25 MG tablet TAKE 1 TABLET(0.25 MG) BY MOUTH TWICE DAILY AS NEEDED FOR ANXIETY 30 tablet 0   Ascorbic Acid (VITAMIN C) 1000 MG tablet Take 1,000 mg by mouth daily.     atorvastatin  (LIPITOR) 10 MG tablet Take 1 tablet (10 mg total) by mouth daily. 90 tablet 3   butalbital-acetaminophen -caffeine (FIORICET) 50-325-40 MG tablet Take 1-2 tablets by mouth 2 (two) times daily as needed for headache (or leg pain).     Cholecalciferol (VITAMIN D3) 125 MCG (5000 UT) CAPS Take 5,000 Units by mouth daily.     FLUoxetine  (PROZAC ) 20 MG capsule Take 1 capsule (20 mg total) by mouth daily. 90 capsule 1   ibuprofen (ADVIL) 200 MG tablet Take 200 mg by mouth every 6 (six) hours as needed.     MAGNESIUM GLYCINATE PO Take 50 mg by mouth daily.     montelukast  (SINGULAIR ) 10 MG tablet TAKE 1 TABLET(10 MG) BY MOUTH AT BEDTIME 30 tablet 2   nebivolol  (BYSTOLIC ) 10 MG tablet Take 1 tablet (10 mg total) by mouth daily. 90 tablet 1   NP THYROID  30 MG tablet Take 30 mg by mouth daily before breakfast.     pantoprazole  (PROTONIX ) 40 MG tablet Take 1 tablet (40 mg total) by mouth daily. 30 tablet 1   Prednicarbate 0.1 % CREA Apply 1 Application topically as needed for itching (affected sites).     REFRESH TEARS PF 0.5-0.9 % SOLN Place 1 drop into both eyes 3 (three) times daily as needed (for irritation).     sodium chloride  (V-R NASAL SPRAY SALINE) 0.65 % nasal spray Place 1 spray into the nose as needed. Use in both nostrils twice  daily 30 mL 6   spironolactone  (ALDACTONE ) 25 MG tablet TAKE 1/2 TABLET(12.5 MG) BY MOUTH DAILY 30 tablet 3   traMADol  (ULTRAM ) 50 MG tablet Take 1 tablet (50 mg total) by mouth every 6 (six) hours as needed for moderate pain (pain  score 4-6) or severe pain (pain score 7-10). 20 tablet 0   valsartan  (DIOVAN ) 320 MG tablet TAKE 1 TABLET(320 MG) BY MOUTH DAILY 90 tablet 3   No current facility-administered medications on file prior to visit.    Review of Systems:  As per HPI- otherwise negative.   Physical Examination: There were no vitals filed for this visit. There were no vitals filed for this visit. There is no height or weight on file to calculate BMI. Ideal Body Weight:    GEN: no acute distress. HEENT: Atraumatic, Normocephalic.  Ears and Nose: No external deformity. CV: RRR, No M/G/R. No JVD. No thrill. No extra heart sounds. PULM: CTA B, no wheezes, crackles, rhonchi. No retractions. No resp. distress. No accessory muscle use. ABD: S, NT, ND, +BS. No rebound. No HSM. EXTR: No c/c/e PSYCH: Normally interactive. Conversant.    Assessment and Plan: No diagnosis found.  Assessment & Plan   Signed Harlene Schroeder, MD

## 2023-11-18 ENCOUNTER — Encounter: Payer: Self-pay | Admitting: Family Medicine

## 2023-11-18 ENCOUNTER — Ambulatory Visit (INDEPENDENT_AMBULATORY_CARE_PROVIDER_SITE_OTHER): Admitting: Family Medicine

## 2023-11-18 VITALS — BP 130/80 | HR 68 | Ht 63.0 in | Wt 181.0 lb

## 2023-11-18 DIAGNOSIS — R5383 Other fatigue: Secondary | ICD-10-CM | POA: Diagnosis not present

## 2023-11-18 DIAGNOSIS — E611 Iron deficiency: Secondary | ICD-10-CM | POA: Diagnosis not present

## 2023-11-18 DIAGNOSIS — F410 Panic disorder [episodic paroxysmal anxiety] without agoraphobia: Secondary | ICD-10-CM | POA: Diagnosis not present

## 2023-11-18 DIAGNOSIS — Z8719 Personal history of other diseases of the digestive system: Secondary | ICD-10-CM | POA: Diagnosis not present

## 2023-11-18 MED ORDER — ALPRAZOLAM 0.25 MG PO TABS: ORAL_TABLET | ORAL | 1 refills | Status: AC

## 2023-11-19 ENCOUNTER — Telehealth: Payer: Self-pay

## 2023-11-19 ENCOUNTER — Other Ambulatory Visit: Payer: Self-pay

## 2023-11-19 ENCOUNTER — Inpatient Hospital Stay

## 2023-11-19 ENCOUNTER — Encounter: Payer: Self-pay | Admitting: Medical Oncology

## 2023-11-19 ENCOUNTER — Inpatient Hospital Stay: Attending: Hematology & Oncology

## 2023-11-19 ENCOUNTER — Inpatient Hospital Stay (HOSPITAL_BASED_OUTPATIENT_CLINIC_OR_DEPARTMENT_OTHER): Admitting: Medical Oncology

## 2023-11-19 VITALS — BP 144/72 | HR 55 | Temp 98.2°F | Resp 19 | Ht 63.0 in | Wt 181.0 lb

## 2023-11-19 VITALS — BP 143/51 | HR 63 | Resp 19

## 2023-11-19 DIAGNOSIS — Z886 Allergy status to analgesic agent status: Secondary | ICD-10-CM | POA: Diagnosis not present

## 2023-11-19 DIAGNOSIS — Z888 Allergy status to other drugs, medicaments and biological substances status: Secondary | ICD-10-CM | POA: Insufficient documentation

## 2023-11-19 DIAGNOSIS — R0602 Shortness of breath: Secondary | ICD-10-CM | POA: Diagnosis not present

## 2023-11-19 DIAGNOSIS — Z881 Allergy status to other antibiotic agents status: Secondary | ICD-10-CM | POA: Insufficient documentation

## 2023-11-19 DIAGNOSIS — D509 Iron deficiency anemia, unspecified: Secondary | ICD-10-CM | POA: Diagnosis not present

## 2023-11-19 DIAGNOSIS — Z91040 Latex allergy status: Secondary | ICD-10-CM | POA: Insufficient documentation

## 2023-11-19 DIAGNOSIS — Z880 Allergy status to penicillin: Secondary | ICD-10-CM | POA: Insufficient documentation

## 2023-11-19 DIAGNOSIS — M6281 Muscle weakness (generalized): Secondary | ICD-10-CM | POA: Insufficient documentation

## 2023-11-19 DIAGNOSIS — Z885 Allergy status to narcotic agent status: Secondary | ICD-10-CM | POA: Diagnosis not present

## 2023-11-19 DIAGNOSIS — Z79899 Other long term (current) drug therapy: Secondary | ICD-10-CM | POA: Diagnosis not present

## 2023-11-19 DIAGNOSIS — D500 Iron deficiency anemia secondary to blood loss (chronic): Secondary | ICD-10-CM

## 2023-11-19 DIAGNOSIS — Z91012 Allergy to eggs, unspecified: Secondary | ICD-10-CM | POA: Insufficient documentation

## 2023-11-19 DIAGNOSIS — D649 Anemia, unspecified: Secondary | ICD-10-CM | POA: Diagnosis not present

## 2023-11-19 DIAGNOSIS — R5383 Other fatigue: Secondary | ICD-10-CM | POA: Diagnosis not present

## 2023-11-19 DIAGNOSIS — Z887 Allergy status to serum and vaccine status: Secondary | ICD-10-CM | POA: Diagnosis not present

## 2023-11-19 DIAGNOSIS — Z882 Allergy status to sulfonamides status: Secondary | ICD-10-CM | POA: Diagnosis not present

## 2023-11-19 DIAGNOSIS — M7071 Other bursitis of hip, right hip: Secondary | ICD-10-CM | POA: Insufficient documentation

## 2023-11-19 DIAGNOSIS — Z91041 Radiographic dye allergy status: Secondary | ICD-10-CM | POA: Diagnosis not present

## 2023-11-19 LAB — CBC
HCT: 23.9 % — CL (ref 36.0–46.0)
Hemoglobin: 7.5 g/dL — CL (ref 12.0–15.0)
MCHC: 31.6 g/dL (ref 30.0–36.0)
MCV: 79.6 fl (ref 78.0–100.0)
Platelets: 281 K/uL (ref 150.0–400.0)
RBC: 3 Mil/uL — ABNORMAL LOW (ref 3.87–5.11)
RDW: 16.9 % — ABNORMAL HIGH (ref 11.5–15.5)
WBC: 4.7 K/uL (ref 4.0–10.5)

## 2023-11-19 LAB — IRON AND IRON BINDING CAPACITY (CC-WL,HP ONLY)
Iron: 10 ug/dL — ABNORMAL LOW (ref 28–170)
Saturation Ratios: 2 % — ABNORMAL LOW (ref 10.4–31.8)
TIBC: 566 ug/dL — ABNORMAL HIGH (ref 250–450)
UIBC: 556 ug/dL

## 2023-11-19 LAB — CBC WITH DIFFERENTIAL (CANCER CENTER ONLY)
Abs Immature Granulocytes: 0.07 K/uL (ref 0.00–0.07)
Basophils Absolute: 0 K/uL (ref 0.0–0.1)
Basophils Relative: 1 %
Eosinophils Absolute: 0 K/uL (ref 0.0–0.5)
Eosinophils Relative: 0 %
HCT: 25.7 % — ABNORMAL LOW (ref 36.0–46.0)
Hemoglobin: 8 g/dL — ABNORMAL LOW (ref 12.0–15.0)
Immature Granulocytes: 1 %
Lymphocytes Relative: 5 %
Lymphs Abs: 0.3 K/uL — ABNORMAL LOW (ref 0.7–4.0)
MCH: 25.2 pg — ABNORMAL LOW (ref 26.0–34.0)
MCHC: 31.1 g/dL (ref 30.0–36.0)
MCV: 80.8 fL (ref 80.0–100.0)
Monocytes Absolute: 0.3 K/uL (ref 0.1–1.0)
Monocytes Relative: 4 %
Neutro Abs: 5.5 K/uL (ref 1.7–7.7)
Neutrophils Relative %: 89 %
Platelet Count: 254 K/uL (ref 150–400)
RBC: 3.18 MIL/uL — ABNORMAL LOW (ref 3.87–5.11)
RDW: 15.1 % (ref 11.5–15.5)
WBC Count: 6.2 K/uL (ref 4.0–10.5)
nRBC: 0 % (ref 0.0–0.2)

## 2023-11-19 LAB — TSH: TSH: 0.59 u[IU]/mL (ref 0.35–5.50)

## 2023-11-19 LAB — RETICULOCYTES
Immature Retic Fract: 22.1 % — ABNORMAL HIGH (ref 2.3–15.9)
RBC.: 3 MIL/uL — ABNORMAL LOW (ref 3.87–5.11)
Retic Count, Absolute: 81 K/uL (ref 19.0–186.0)
Retic Ct Pct: 2.7 % (ref 0.4–3.1)

## 2023-11-19 LAB — SAMPLE TO BLOOD BANK

## 2023-11-19 LAB — FERRITIN: Ferritin: 5.6 ng/mL — ABNORMAL LOW (ref 10.0–291.0)

## 2023-11-19 MED ORDER — SODIUM CHLORIDE 0.9 % IV SOLN
510.0000 mg | Freq: Once | INTRAVENOUS | Status: AC
  Administered 2023-11-19: 510 mg via INTRAVENOUS
  Filled 2023-11-19: qty 17

## 2023-11-19 MED ORDER — SODIUM CHLORIDE 0.9 % IV SOLN: Freq: Once | INTRAVENOUS | Status: AC

## 2023-11-19 NOTE — Addendum Note (Signed)
 Addended by: TONETTE DOMINO on: 11/19/2023 03:27 PM   Modules accepted: Orders

## 2023-11-19 NOTE — Telephone Encounter (Signed)
 CRITICAL VALUE STICKER  CRITICAL VALUE: Hemoglobin 7.5           Hematocrit 23.9  RECEIVER (on-site recipient of call): Angela Gibbs  DATE & TIME NOTIFIED: 11/19/2023 1029  MESSENGER (representative from lab): Darice  MD NOTIFIED: Dr. Watt  TIME OF NOTIFICATION: 11/19/2023 1030  RESPONSE:

## 2023-11-19 NOTE — Telephone Encounter (Signed)
 Handled.

## 2023-11-19 NOTE — Progress Notes (Signed)
 Hematology and Oncology Follow Up Visit  Angela Gibbs [id number] 29-Nov-1940 83 y.o. 11/19/2023   Principle Diagnosis:  Iron  deficiency anemia   Current Therapy:        IV iron  as indicated    Interim History:  Angela Gibbs is here today with her husband for follow-up.   She was scheduled for an unplanned visit today due to symptomatic anemia found yesterday by her PCP. Her Hgb was 7.5 and ferritin was 5.  She has fatigue, generalized muscle weakness and mild SOB with activity.  She has a history of restrictive airway disease that is worsened with anemia.  No obvious blood loss since her last visit. She has recently had GI work up without known cause for her iron  deficiency.   No bruising or petechiae.  No fever, chills, n/v, cough, rash, dizziness, chest pain, palpitations, abdominal pain or changes in bowel or bladder habits.   No injury. No falls or syncope.  Appetite and hydration are good.   ECOG Performance Status: 1 - Symptomatic but completely ambulatory  Medications:  Allergies as of 11/19/2023       Reactions   Cephalosporins Shortness Of Breath, Other (See Comments)   Breathing difficulty   Ciprofloxacin Hives, Shortness Of Breath   Influenza Virus Vaccine Other (See Comments)   Numbness in legs   Keflex  [cephalexin ] Hives   Latex Hives, Itching, Rash   Levaquin [levofloxacin] Hives, Palpitations, Other (See Comments)   Chest pain   Nitrofuran Derivatives Hives, Itching, Swelling   Penicillins Hives   Sulfacetamide Sodium Hives   Tape Itching, Other (See Comments)   RED AND IRRITATED SKIN   Xopenex  [levalbuterol  Hcl] Anxiety, Other (See Comments)   Tremors with violent body chattering and shaking   Zostavax [zoster Vaccine Live] Other (See Comments)   Pt has been told by specialists not to receive vaccine d/t h/o herpes in bloodstream and shingles.   Erythromycin Other (See Comments)   stomach issues   Hydrochlorothiazide  Other (See Comments)   Migraine    Lisinopril Cough   Petrolatum Nausea Only   Pregabalin Other (See Comments)   Ropinirole  Hcl Nausea And Vomiting   Shellfish Allergy Hives   Sulfa Antibiotics Hives   Golimumab  Other (See Comments)   Felt poorly (Simponi )   Ibuprofen Other (See Comments)   Stomach upset   Lactose Other (See Comments)   stomach issues Other Reaction(s): GI Intolerance   Aspirin Other (See Comments)   Stomach pain/burning   Avelox [moxifloxacin Hcl In Nacl] Nausea Only   Cefdinir Nausea And Vomiting   Chromium Other (See Comments)   Blisters   Egg Solids, Whole Other (See Comments)   Per allergy testing, no known reaction. Pt states she was told she could probably eat 2-3 eggs weekly w/o issue.   Egg-derived Products    Per allergy testing, no known reaction. Pt states she was told she could probably eat 2-3 eggs weekly w/o issue.   Iodinated Contrast Media Other (See Comments)   unknown   Lactose Intolerance (gi) Other (See Comments)   stomach issues   Percodan [oxycodone -aspirin] Nausea And Vomiting   Prednisone  Palpitations   No injections, can do the pills   Troleandomycin Other (See Comments)   Unknown reaction        Medication List        Accurate as of November 19, 2023  3:10 PM. If you have any questions, ask your nurse or doctor.  ALPRAZolam  0.25 MG tablet Commonly known as: XANAX  TAKE 1 TABLET(0.25 MG) BY MOUTH TWICE DAILY AS NEEDED FOR ANXIETY   atorvastatin  10 MG tablet Commonly known as: LIPITOR Take 1 tablet (10 mg total) by mouth daily.   butalbital-acetaminophen -caffeine 50-325-40 MG tablet Commonly known as: FIORICET Take 1-2 tablets by mouth 2 (two) times daily as needed for headache (or leg pain).   FLUoxetine  20 MG capsule Commonly known as: PROZAC  Take 1 capsule (20 mg total) by mouth daily.   ibuprofen 200 MG tablet Commonly known as: ADVIL Take 200 mg by mouth every 6 (six) hours as needed.   MAGNESIUM GLYCINATE PO Take 50 mg by  mouth daily.   montelukast  10 MG tablet Commonly known as: SINGULAIR  TAKE 1 TABLET(10 MG) BY MOUTH AT BEDTIME   nebivolol  10 MG tablet Commonly known as: BYSTOLIC  Take 1 tablet (10 mg total) by mouth daily.   NP Thyroid  30 MG tablet Generic drug: thyroid  Take 30 mg by mouth daily before breakfast.   pantoprazole  40 MG tablet Commonly known as: Protonix  Take 1 tablet (40 mg total) by mouth daily.   Prednicarbate 0.1 % Crea Apply 1 Application topically as needed for itching (affected sites).   Refresh Tears PF 0.5-0.9 % Soln Generic drug: Carboxymethylcell-Glycerin PF Place 1 drop into both eyes 3 (three) times daily as needed (for irritation).   sodium chloride  0.65 % nasal spray Commonly known as: V-R NASAL SPRAY SALINE Place 1 spray into the nose as needed. Use in both nostrils twice daily   spironolactone  25 MG tablet Commonly known as: ALDACTONE  TAKE 1/2 TABLET(12.5 MG) BY MOUTH DAILY   traMADol  50 MG tablet Commonly known as: ULTRAM  Take 1 tablet (50 mg total) by mouth every 6 (six) hours as needed for moderate pain (pain score 4-6) or severe pain (pain score 7-10).   valsartan  320 MG tablet Commonly known as: DIOVAN  TAKE 1 TABLET(320 MG) BY MOUTH DAILY   vitamin C 1000 MG tablet Take 1,000 mg by mouth daily.   Vitamin D3 125 MCG (5000 UT) Caps Take 5,000 Units by mouth daily.        Allergies:  Allergies  Allergen Reactions   Cephalosporins Shortness Of Breath and Other (See Comments)    Breathing difficulty   Ciprofloxacin Hives and Shortness Of Breath   Influenza Virus Vaccine Other (See Comments)    Numbness in legs   Keflex  [Cephalexin ] Hives   Latex Hives, Itching and Rash   Levaquin [Levofloxacin] Hives, Palpitations and Other (See Comments)    Chest pain   Nitrofuran Derivatives Hives, Itching and Swelling   Penicillins Hives   Sulfacetamide Sodium Hives   Tape Itching and Other (See Comments)    RED AND IRRITATED SKIN   Xopenex   [Levalbuterol  Hcl] Anxiety and Other (See Comments)    Tremors with violent body chattering and shaking   Zostavax [Zoster Vaccine Live] Other (See Comments)    Pt has been told by specialists not to receive vaccine d/t h/o herpes in bloodstream and shingles.   Erythromycin Other (See Comments)    stomach issues   Hydrochlorothiazide  Other (See Comments)    Migraine   Lisinopril Cough   Petrolatum Nausea Only   Pregabalin Other (See Comments)   Ropinirole  Hcl Nausea And Vomiting   Shellfish Allergy Hives   Sulfa Antibiotics Hives   Golimumab  Other (See Comments)    Felt poorly (Simponi )   Ibuprofen Other (See Comments)    Stomach upset   Lactose Other (See  Comments)    stomach issues  Other Reaction(s): GI Intolerance   Aspirin Other (See Comments)    Stomach pain/burning   Avelox [Moxifloxacin Hcl In Nacl] Nausea Only   Cefdinir Nausea And Vomiting   Chromium Other (See Comments)    Blisters   Egg Solids, Whole Other (See Comments)    Per allergy testing, no known reaction. Pt states she was told she could probably eat 2-3 eggs weekly w/o issue.   Egg-Derived Products     Per allergy testing, no known reaction. Pt states she was told she could probably eat 2-3 eggs weekly w/o issue.   Iodinated Contrast Media Other (See Comments)    unknown    Lactose Intolerance (Gi) Other (See Comments)    stomach issues   Percodan [Oxycodone -Aspirin] Nausea And Vomiting   Prednisone  Palpitations    No injections, can do the pills   Troleandomycin Other (See Comments)    Unknown reaction    Past Medical History, Surgical history, Social history, and Family History were reviewed and updated.  Review of Systems: All other 10 point review of systems is negative.   Physical Exam:  height is 5' 3 (1.6 m) and weight is 181 lb (82.1 kg). Her oral temperature is 98.2 F (36.8 C). Her blood pressure is 144/72 (abnormal) and her pulse is 55 (abnormal). Her respiration is 19 and  oxygen saturation is 100%.   Wt Readings from Last 3 Encounters:  11/19/23 181 lb (82.1 kg)  11/18/23 181 lb (82.1 kg)  10/16/23 179 lb 2 oz (81.3 kg)   Constitutional: Sitting comfortably in wheelchair. Mild pallor of skin Ocular: Sclerae unicteric, pupils equal, round and reactive to light Ear-nose-throat: Oropharynx clear, dentition fair Lymphatic: No cervical or supraclavicular adenopathy Lungs no rales or rhonchi, good excursion bilaterally Heart regular rate and rhythm, no murmur appreciated Abd soft, nontender, positive bowel sounds MSK no focal spinal tenderness, no joint edema Neuro: non-focal, well-oriented, appropriate affect  Lab Results  Component Value Date   WBC 6.2 11/19/2023   HGB 8.0 (L) 11/19/2023   HCT 25.7 (L) 11/19/2023   MCV 80.8 11/19/2023   PLT 254 11/19/2023   Lab Results  Component Value Date   FERRITIN 5.6 (L) 11/18/2023   IRON  66 05/12/2023   TIBC 385 05/12/2023   UIBC 319 05/12/2023   IRONPCTSAT 17 05/12/2023   Lab Results  Component Value Date   RETICCTPCT 2.7 11/19/2023   RBC 3.18 (L) 11/19/2023   RBC 3.00 (L) 11/19/2023   No results found for: KPAFRELGTCHN, LAMBDASER, KAPLAMBRATIO No results found for: KIMBERLY LE, IGMSERUM No results found for: STEPHANY CARLOTA BENSON MARKEL EARLA JOANNIE DOC, MSPIKE, SPEI   Chemistry      Component Value Date/Time   NA 134 (L) 09/22/2023 1516   NA 133 (L) 02/16/2023 1341   K 4.8 09/22/2023 1516   CL 98 09/22/2023 1516   CO2 26 09/22/2023 1516   BUN 14 09/22/2023 1516   BUN 15 02/16/2023 1341   CREATININE 0.67 09/22/2023 1516   CREATININE 0.70 05/01/2021 1430   CREATININE 0.76 12/07/2019 1413   GLU 80 11/18/2019 0000      Component Value Date/Time   CALCIUM  9.2 09/22/2023 1516   ALKPHOS 71 08/13/2023 1616   AST 18 08/13/2023 1616   AST 18 05/01/2021 1430   ALT 17 08/13/2023 1616   ALT 16 05/01/2021 1430   BILITOT 0.6 08/13/2023 1616   BILITOT  0.3 01/22/2023 1602   BILITOT 0.4 05/01/2021 1430  Encounter Diagnosis  Name Primary?   Symptomatic anemia Yes    Impression and Plan: Angela Gibbs is a very pleasant 83 yo caucasian female with history of iron  deficiency anemia of unknown origin. She has seen GI who has ruled out GI origin.   IV Feraheme  today as Hgb has risen to 8 and she is no longer eligible for blood products as she has no known active bleeding.  IV Feraheme  in 1 week.  RTC 1 month APP, labs (CBC w/, CMP, MDS, retic, B12, folate, smear, ferritin, iron  sat)  Lauraine CHRISTELLA Dais, PA-C 10/2/20253:10 PM

## 2023-11-19 NOTE — Addendum Note (Signed)
 Addended by: DORIEN FRANCHOT BROCKS on: 11/19/2023 03:05 PM   Modules accepted: Orders

## 2023-11-19 NOTE — Telephone Encounter (Signed)
 Dr. Timmy notified by Dr. Ubaldo that patient had critical hemoglobin of 7.5  Dr. Timmy would like patient to be seen and transfused as patient has history of iron  deficiency. This RN called patient who was having a root canal at the time and was able to speak with pt husband. Zachary Pizza notified that patient hemoglobin has dropped and needs to be transfused. Mr. Konczal stated wife had been very fatigued and weak. This RN informed Mr. Shimmin that would be correct considering her hemoglobin. This RN asked if pt could come in for labs and to see a provider and be transfused tomorrow. Mr. Newborn stated he would bring his wife by for labs and go from there. Dr. Timmy aware. Pt husband agreeable to plan and appreciative of help.

## 2023-11-19 NOTE — Patient Instructions (Signed)

## 2023-11-20 ENCOUNTER — Encounter

## 2023-11-23 ENCOUNTER — Ambulatory Visit: Admission: RE | Admit: 2023-11-23 | Source: Ambulatory Visit

## 2023-11-23 ENCOUNTER — Ambulatory Visit

## 2023-11-23 ENCOUNTER — Ambulatory Visit: Payer: Self-pay | Admitting: Medical Oncology

## 2023-11-23 ENCOUNTER — Ambulatory Visit
Admission: RE | Admit: 2023-11-23 | Discharge: 2023-11-23 | Disposition: A | Discharge status: Home / Self Care | Source: Ambulatory Visit | Attending: Radiation Oncology | Admitting: Radiation Oncology

## 2023-11-23 ENCOUNTER — Telehealth: Payer: Self-pay

## 2023-11-23 ENCOUNTER — Other Ambulatory Visit: Payer: Self-pay

## 2023-11-23 ENCOUNTER — Inpatient Hospital Stay: Admitting: Hematology

## 2023-11-23 DIAGNOSIS — C50311 Malignant neoplasm of lower-inner quadrant of right female breast: Secondary | ICD-10-CM | POA: Insufficient documentation

## 2023-11-23 DIAGNOSIS — C50211 Malignant neoplasm of upper-inner quadrant of right female breast: Secondary | ICD-10-CM | POA: Diagnosis not present

## 2023-11-23 DIAGNOSIS — Z170 Estrogen receptor positive status [ER+]: Secondary | ICD-10-CM | POA: Diagnosis not present

## 2023-11-23 DIAGNOSIS — Z510 Encounter for antineoplastic radiation therapy: Secondary | ICD-10-CM | POA: Diagnosis not present

## 2023-11-23 LAB — RAD ONC ARIA SESSION SUMMARY
Course Elapsed Days: 28
Plan Fractions Treated to Date: 4
Plan Prescribed Dose Per Fraction: 5.7 Gy
Plan Total Fractions Prescribed: 5
Plan Total Prescribed Dose: 28.5 Gy
Reference Point Dosage Given to Date: 22.8 Gy
Reference Point Session Dosage Given: 5.7 Gy
Session Number: 4

## 2023-11-23 NOTE — Telephone Encounter (Signed)
 I have not sent a message to reschedule the patient as of yet.SABRASpoke w/ Patient and her husband. They did not show up to the appt today because they think it is a scam they didn't see the need to see Dr. Lanny and wanted to just see radiation team. Anders spoke with the patient and advised her to share her thoughts w/ Dr. Izell today at her radiation appt.SABRA

## 2023-11-24 ENCOUNTER — Telehealth: Payer: Self-pay | Admitting: Radiation Oncology

## 2023-11-24 NOTE — Telephone Encounter (Signed)
 10/7 @ 8:57 am Left voicemail for patient to call our office to be sch for FU appt.

## 2023-11-25 ENCOUNTER — Inpatient Hospital Stay

## 2023-11-25 VITALS — BP 186/67 | HR 50 | Temp 97.8°F | Resp 19

## 2023-11-25 DIAGNOSIS — R0602 Shortness of breath: Secondary | ICD-10-CM | POA: Diagnosis not present

## 2023-11-25 DIAGNOSIS — R5383 Other fatigue: Secondary | ICD-10-CM | POA: Diagnosis not present

## 2023-11-25 DIAGNOSIS — D509 Iron deficiency anemia, unspecified: Secondary | ICD-10-CM | POA: Diagnosis not present

## 2023-11-25 DIAGNOSIS — D500 Iron deficiency anemia secondary to blood loss (chronic): Secondary | ICD-10-CM

## 2023-11-25 DIAGNOSIS — M6281 Muscle weakness (generalized): Secondary | ICD-10-CM | POA: Diagnosis not present

## 2023-11-25 DIAGNOSIS — M7071 Other bursitis of hip, right hip: Secondary | ICD-10-CM | POA: Diagnosis not present

## 2023-11-25 DIAGNOSIS — Z79899 Other long term (current) drug therapy: Secondary | ICD-10-CM | POA: Diagnosis not present

## 2023-11-25 MED ORDER — SODIUM CHLORIDE 0.9 % IV SOLN
510.0000 mg | Freq: Once | INTRAVENOUS | Status: AC
  Administered 2023-11-25: 510 mg via INTRAVENOUS
  Filled 2023-11-25: qty 510

## 2023-11-25 MED ORDER — SODIUM CHLORIDE 0.9 % IV SOLN: Freq: Once | INTRAVENOUS | Status: AC

## 2023-11-25 NOTE — Patient Instructions (Signed)

## 2023-11-30 ENCOUNTER — Other Ambulatory Visit: Payer: Self-pay

## 2023-11-30 ENCOUNTER — Ambulatory Visit

## 2023-11-30 ENCOUNTER — Ambulatory Visit
Admission: RE | Admit: 2023-11-30 | Discharge: 2023-11-30 | Disposition: A | Discharge status: Home / Self Care | Source: Ambulatory Visit | Attending: Radiation Oncology | Admitting: Radiation Oncology

## 2023-11-30 ENCOUNTER — Ambulatory Visit
Admission: RE | Admit: 2023-11-30 | Discharge: 2023-11-30 | Disposition: A | Source: Ambulatory Visit | Attending: Radiation Oncology

## 2023-11-30 DIAGNOSIS — C50311 Malignant neoplasm of lower-inner quadrant of right female breast: Secondary | ICD-10-CM | POA: Diagnosis not present

## 2023-11-30 DIAGNOSIS — C50211 Malignant neoplasm of upper-inner quadrant of right female breast: Secondary | ICD-10-CM | POA: Diagnosis not present

## 2023-11-30 DIAGNOSIS — Z170 Estrogen receptor positive status [ER+]: Secondary | ICD-10-CM | POA: Diagnosis not present

## 2023-11-30 DIAGNOSIS — Z510 Encounter for antineoplastic radiation therapy: Secondary | ICD-10-CM | POA: Diagnosis not present

## 2023-11-30 LAB — RAD ONC ARIA SESSION SUMMARY
Course Elapsed Days: 35
Plan Fractions Treated to Date: 5
Plan Prescribed Dose Per Fraction: 5.7 Gy
Plan Total Fractions Prescribed: 5
Plan Total Prescribed Dose: 28.5 Gy
Reference Point Dosage Given to Date: 28.5 Gy
Reference Point Session Dosage Given: 5.7 Gy
Session Number: 5

## 2023-12-03 NOTE — Radiation Completion Notes (Signed)
 Patient Name: Angela Gibbs, Angela Gibbs MRN: [id number] Date of Birth: [birth year] Referring Physician: [name], M.D. Date of Service: 2023-12-03 Radiation Oncologist: Lauraine Golden, M.D.  [facility] [place]                             RADIATION ONCOLOGY END OF TREATMENT NOTE     Diagnosis: C50.311 Malignant neoplasm of lower-inner quadrant of right female breast Staging on 2023-09-24: Carcinoma of lower-inner quadrant of right breast in female, estrogen receptor positive (HCC) T=pT1b, N=pN0, M=cM0 Staging on 2023-09-07: Carcinoma of lower-inner quadrant of right breast in female, estrogen receptor positive (HCC) T=cT1b, N=cN0, M=cM0 Intent: Curative     ==========DELIVERED PLANS==========  First Treatment Date: 2023-10-26 Last Treatment Date: 2023-11-30   Plan Name: Breast_R Site: Breast, Right Technique: 3D Mode: Photon Dose Per Fraction: 5.7 Gy Prescribed Dose (Delivered / Prescribed): 28.5 Gy / 28.5 Gy Prescribed Fxs (Delivered / Prescribed): 5 / 5     ==========ON TREATMENT VISIT DATES========== 2023-10-26, 2023-11-23     ==========UPCOMING VISITS========== 12/31/2023 [facility] FOLLOW UP 20 Golden Lauraine, MD  12/17/2023 [facility] ANNUAL WELL VISIT, SEQUENTIAL [facility] [place] VISIT 2  12/15/2023 [facility] EST PT 15 Tonette Lauraine CHRISTELLA DEVONNA  12/15/2023 [facility]        ==========APPENDIX - ON TREATMENT VISIT NOTES==========   See weekly On Treatment Notes in Epic for details in the Media tab (listed as Progress notes on the On Treatment Visit Dates listed above).

## 2023-12-07 ENCOUNTER — Ambulatory Visit

## 2023-12-08 DIAGNOSIS — R531 Weakness: Secondary | ICD-10-CM | POA: Diagnosis not present

## 2023-12-08 DIAGNOSIS — Z96641 Presence of right artificial hip joint: Secondary | ICD-10-CM | POA: Diagnosis not present

## 2023-12-08 DIAGNOSIS — M8580 Other specified disorders of bone density and structure, unspecified site: Secondary | ICD-10-CM | POA: Diagnosis not present

## 2023-12-08 DIAGNOSIS — J45909 Unspecified asthma, uncomplicated: Secondary | ICD-10-CM | POA: Diagnosis not present

## 2023-12-08 DIAGNOSIS — M2560 Stiffness of unspecified joint, not elsewhere classified: Secondary | ICD-10-CM | POA: Diagnosis not present

## 2023-12-08 DIAGNOSIS — Z923 Personal history of irradiation: Secondary | ICD-10-CM | POA: Diagnosis not present

## 2023-12-08 DIAGNOSIS — M4316 Spondylolisthesis, lumbar region: Secondary | ICD-10-CM | POA: Diagnosis not present

## 2023-12-08 DIAGNOSIS — Z7409 Other reduced mobility: Secondary | ICD-10-CM | POA: Diagnosis not present

## 2023-12-08 DIAGNOSIS — L4050 Arthropathic psoriasis, unspecified: Secondary | ICD-10-CM | POA: Diagnosis not present

## 2023-12-08 DIAGNOSIS — M1990 Unspecified osteoarthritis, unspecified site: Secondary | ICD-10-CM | POA: Diagnosis not present

## 2023-12-08 DIAGNOSIS — R293 Abnormal posture: Secondary | ICD-10-CM | POA: Diagnosis not present

## 2023-12-08 DIAGNOSIS — M7061 Trochanteric bursitis, right hip: Secondary | ICD-10-CM | POA: Diagnosis not present

## 2023-12-08 DIAGNOSIS — M4185 Other forms of scoliosis, thoracolumbar region: Secondary | ICD-10-CM | POA: Diagnosis not present

## 2023-12-08 DIAGNOSIS — M51369 Other intervertebral disc degeneration, lumbar region without mention of lumbar back pain or lower extremity pain: Secondary | ICD-10-CM | POA: Diagnosis not present

## 2023-12-08 DIAGNOSIS — G500 Trigeminal neuralgia: Secondary | ICD-10-CM | POA: Diagnosis not present

## 2023-12-08 DIAGNOSIS — C50919 Malignant neoplasm of unspecified site of unspecified female breast: Secondary | ICD-10-CM | POA: Diagnosis not present

## 2023-12-08 DIAGNOSIS — M47816 Spondylosis without myelopathy or radiculopathy, lumbar region: Secondary | ICD-10-CM | POA: Diagnosis not present

## 2023-12-08 DIAGNOSIS — Z9109 Other allergy status, other than to drugs and biological substances: Secondary | ICD-10-CM | POA: Diagnosis not present

## 2023-12-08 DIAGNOSIS — Z853 Personal history of malignant neoplasm of breast: Secondary | ICD-10-CM | POA: Diagnosis not present

## 2023-12-08 DIAGNOSIS — I10 Essential (primary) hypertension: Secondary | ICD-10-CM | POA: Diagnosis not present

## 2023-12-08 DIAGNOSIS — R5383 Other fatigue: Secondary | ICD-10-CM | POA: Diagnosis not present

## 2023-12-08 DIAGNOSIS — K219 Gastro-esophageal reflux disease without esophagitis: Secondary | ICD-10-CM | POA: Diagnosis not present

## 2023-12-08 DIAGNOSIS — M25659 Stiffness of unspecified hip, not elsewhere classified: Secondary | ICD-10-CM | POA: Diagnosis not present

## 2023-12-08 DIAGNOSIS — R269 Unspecified abnormalities of gait and mobility: Secondary | ICD-10-CM | POA: Diagnosis not present

## 2023-12-08 NOTE — Unmapped External Note (Signed)
 [facility] - OUTPATIENT PHYSICAL THERAPY [address] [place] [address] [phone] Fax: [phone]   PHYSICIAN SIGNATURE REQUIRED  Thank you for your referral. Please review the following Plan of Care and sign electronically, or fax signed paper copy to: [phone].   Please call Dept: [phone] with any questions or concerns.  Suzen JONETTA Ditch, PT  Physical Therapy Plan of Care  Initial Evaluation Diagnosis:  1. Abnormal posture      2. Decreased stamina      3. Stiffness of multiple joints      4. Abnormal gait           Impairment List: Activity tolerance, Gait, Joint stiffness, Knowledge of condition, Pain, Safety awareness, Soft tissue dysfunction, Endurance, Functional limitations, Mobility, Patient/family education, Range of motion, Strength Problem List Comments: abnormal posture   Plan and Recommendations: Plan Recommended PT Treatment/Interventions: Gait training (02883); Manual therapy (97140); Neuromuscular re-education [phone]); Orthotics fit/training (97760/97763); Self-care/home management [phone]); Therapeutic activity (97530); Therapeutic exercise (97110); Therapeutic massage [phone]) PT Frequency: Other (comment) (16 visits 2 x week tapering to 1 x week as needed 12/08/2023- 03/01/2024 medicare poc end date) PT Duration: 12 weeks (16 visits 2 x week tapering to 1 x week over 12 weeks 12/08/2023-03/01/2024 medicare poc end date) Treatment Plan Details: 16 visits 2 x week tapering to 1 x week over 12 weeks 12/08/2023-03/01/2024 medicare poc end date    Goals:  Goals Addressed             This Visit's Progress   . PT Goal       Short-term goals to be achieved in 8 visits #1 client will improve single knee-to-chest bilaterally to 115-120 degrees and improve active straight leg raise to 75-80 degrees bilaterally to improve muscle length tension relationship to decrease compressive  forces to the lumbar and hip region to decrease pain and increased ease with mobility tasks.  12/08/2023: Single knee-to-chest right 98 degrees, left 110 degrees Straight leg raise right 61 degrees, left 70 degrees  Long-term goals to be achieved in 16 visits by 03/01/2024 #1: Client to be independent with updated and illustrated home exercise program to continue improvement managing symptoms  12/08/2023: Client reports no participation in HEP at this time  #2: Client to decrease worst reported right hip pain 0-4/10 and improve ABC score to 75% or better to reflect less pain with mobility tasks and increased confidence with mobility tasks for improved activity tolerance.  12/08/2023: Worst reported right lateral hip pain 8/10, ABC score 61.25%  #3: Client to improve sit to stand without upper extremity support 16 seconds or better and improve timed up and go without assistive device to 11 seconds or better to reflect an improvement in lower extremity strength, improved cadence, improve dynamic balance and core strength to facilitate an improved tolerance for mobility tasks.  12/08/2023: Sit to stand x 5 with upper extremity support 17.70 seconds (client unable to perform sit to stand without upper extremity support) -Timed up and go without assistive device 15.70 seconds  #4: Client will ambulate with 1.1 m/s walking speed and ambulate a full 2 minutes for 300 feet with without assistive device to reflect an improvement in cadence, gait tolerance and activity tolerance.  12/08/2023: Walking speed without assistive device 0.95 m/s 2-minute walk test: Client only tolerated 50 seconds of ambulation for 147 feet without assistive device.          Certification: This is to certify  that the above named patient, who is under my care, requires skilled Therapy services as described in the above treatment plan. I further certify that the services outlined in this plan are skilled and medically  necessary. I have reviewed this plan for rehabilitation services, and I recommend that these services continue to meet the above stated goals and plan.

## 2023-12-09 ENCOUNTER — Other Ambulatory Visit: Payer: Self-pay | Admitting: Family Medicine

## 2023-12-14 ENCOUNTER — Encounter: Payer: Self-pay | Admitting: Family

## 2023-12-14 ENCOUNTER — Inpatient Hospital Stay

## 2023-12-14 ENCOUNTER — Inpatient Hospital Stay: Admitting: Family

## 2023-12-14 ENCOUNTER — Ambulatory Visit

## 2023-12-14 VITALS — BP 141/85 | HR 59 | Temp 97.9°F | Resp 20 | Ht 63.0 in | Wt 180.0 lb

## 2023-12-14 DIAGNOSIS — R5383 Other fatigue: Secondary | ICD-10-CM | POA: Diagnosis not present

## 2023-12-14 DIAGNOSIS — M6281 Muscle weakness (generalized): Secondary | ICD-10-CM | POA: Diagnosis not present

## 2023-12-14 DIAGNOSIS — D649 Anemia, unspecified: Secondary | ICD-10-CM | POA: Diagnosis not present

## 2023-12-14 DIAGNOSIS — D509 Iron deficiency anemia, unspecified: Secondary | ICD-10-CM | POA: Diagnosis not present

## 2023-12-14 DIAGNOSIS — R0602 Shortness of breath: Secondary | ICD-10-CM | POA: Diagnosis not present

## 2023-12-14 DIAGNOSIS — M7071 Other bursitis of hip, right hip: Secondary | ICD-10-CM | POA: Diagnosis not present

## 2023-12-14 DIAGNOSIS — D500 Iron deficiency anemia secondary to blood loss (chronic): Secondary | ICD-10-CM

## 2023-12-14 DIAGNOSIS — Z79899 Other long term (current) drug therapy: Secondary | ICD-10-CM | POA: Diagnosis not present

## 2023-12-14 LAB — RETIC PANEL
Immature Retic Fract: 8.7 % (ref 2.3–15.9)
RBC.: 3.45 MIL/uL — ABNORMAL LOW (ref 3.87–5.11)
Retic Count, Absolute: 72.1 K/uL (ref 19.0–186.0)
Retic Ct Pct: 2.1 % (ref 0.4–3.1)
Reticulocyte Hemoglobin: 36.8 pg (ref >27.9)

## 2023-12-14 LAB — CBC WITH DIFFERENTIAL (CANCER CENTER ONLY)
Abs Immature Granulocytes: 0.03 K/uL (ref 0.00–0.07)
Basophils Absolute: 0.1 K/uL (ref 0.0–0.1)
Basophils Relative: 1 %
Eosinophils Absolute: 0.2 K/uL (ref 0.0–0.5)
Eosinophils Relative: 4 %
HCT: 30.9 % — ABNORMAL LOW (ref 36.0–46.0)
Hemoglobin: 10 g/dL — ABNORMAL LOW (ref 12.0–15.0)
Immature Granulocytes: 1 %
Lymphocytes Relative: 15 %
Lymphs Abs: 0.7 K/uL (ref 0.7–4.0)
MCH: 29.1 pg (ref 26.0–34.0)
MCHC: 32.4 g/dL (ref 30.0–36.0)
MCV: 89.8 fL (ref 80.0–100.0)
Monocytes Absolute: 0.7 K/uL (ref 0.1–1.0)
Monocytes Relative: 13 %
Neutro Abs: 3.3 K/uL (ref 1.7–7.7)
Neutrophils Relative %: 66 %
Platelet Count: 200 K/uL (ref 150–400)
RBC: 3.44 MIL/uL — ABNORMAL LOW (ref 3.87–5.11)
RDW: 24.2 % — ABNORMAL HIGH (ref 11.5–15.5)
WBC Count: 4.9 K/uL (ref 4.0–10.5)
nRBC: 0 % (ref 0.0–0.2)

## 2023-12-14 LAB — CMP (CANCER CENTER ONLY)
ALT: 14 U/L (ref 0–44)
AST: 18 U/L (ref 15–41)
Albumin: 3.9 g/dL (ref 3.5–5.0)
Alkaline Phosphatase: 77 U/L (ref 38–126)
Anion gap: 7 (ref 5–15)
BUN: 14 mg/dL (ref 8–23)
CO2: 25 mmol/L (ref 22–32)
Calcium: 9 mg/dL (ref 8.9–10.3)
Chloride: 103 mmol/L (ref 98–111)
Creatinine: 0.57 mg/dL (ref 0.44–1.00)
GFR, Estimated: >60 mL/min (ref >60)
Glucose, Bld: 107 mg/dL — ABNORMAL HIGH (ref 70–99)
Potassium: 4.2 mmol/L (ref 3.5–5.1)
Sodium: 136 mmol/L (ref 135–145)
Total Bilirubin: 0.3 mg/dL (ref 0.0–1.2)
Total Protein: 6.1 g/dL — ABNORMAL LOW (ref 6.5–8.1)

## 2023-12-14 LAB — FOLATE: Folate: >20 ng/mL (ref >5.9)

## 2023-12-14 LAB — IRON AND IRON BINDING CAPACITY (CC-WL,HP ONLY)
Iron: 60 ug/dL (ref 28–170)
Saturation Ratios: 16 % (ref 10.4–31.8)
TIBC: 370 ug/dL (ref 250–450)
UIBC: 310 ug/dL

## 2023-12-14 LAB — SAMPLE TO BLOOD BANK

## 2023-12-14 LAB — SAVE SMEAR(SSMR), FOR PROVIDER SLIDE REVIEW

## 2023-12-14 LAB — FERRITIN: Ferritin: 142 ng/mL (ref 11–307)

## 2023-12-14 LAB — VITAMIN B12: Vitamin B-12: 448 pg/mL (ref 180–914)

## 2023-12-14 NOTE — Progress Notes (Signed)
 Hematology and Oncology Follow Up Visit  Angela Gibbs [id number] 16-Sep-1940 83 y.o. 12/14/2023   Principle Diagnosis:  Iron  deficiency anemia   Current Therapy:        IV iron  as indicated    Interim History:  [name] is here today with her husband for follow-up. She is doing well but does note fatigue and mild SOB with exertion.  She has bursitis in the right hip that causes pain and stiffness in the mornings. She states that she walks with crutches for support for the first hour after getting up.  No fever, chills, n/v, cough, rash, dizziness, chest pain, palpitations, abdominal pain or changes in bowel or bladder habits.  She states that she had 1 dark tarry stool episode 4 days ago after eating sine dark chocolate. No other episodes of dark stools.  No obvious blood loss. No bruising or petechiae.  No falls or syncope reported.  Appetite and hydration are good. Weight is stable at 180 lbs.   ECOG Performance Status: 1 - Symptomatic but completely ambulatory  Medications:  Allergies as of 12/14/2023       Reactions   Cephalosporins Shortness Of Breath, Other (See Comments)   Breathing difficulty   Ciprofloxacin Hives, Shortness Of Breath   Influenza Virus Vaccine Other (See Comments)   Numbness in legs   Keflex  [cephalexin ] Hives   Latex Hives, Itching, Rash   Levaquin [levofloxacin] Hives, Palpitations, Other (See Comments)   Chest pain   Nitrofuran Derivatives Hives, Itching, Swelling   Penicillins Hives   Sulfacetamide Sodium Hives   Tape Itching, Other (See Comments)   RED AND IRRITATED SKIN   Xopenex  [levalbuterol  Hcl] Anxiety, Other (See Comments)   Tremors with violent body chattering and shaking   Zostavax [zoster Vaccine Live] Other (See Comments)   Pt has been told by specialists not to receive vaccine d/t h/o herpes in bloodstream and shingles.   Erythromycin Other (See Comments)   stomach issues   Hydrochlorothiazide  Other (See Comments)    Migraine   Lisinopril Cough   Petrolatum Nausea Only   Pregabalin Other (See Comments)   Ropinirole  Hcl Nausea And Vomiting   Shellfish Allergy Hives   Sulfa Antibiotics Hives   Golimumab  Other (See Comments)   Felt poorly (Simponi )   Ibuprofen Other (See Comments)   Stomach upset   Lactose Other (See Comments)   stomach issues Other Reaction(s): GI Intolerance   Aspirin Other (See Comments)   Stomach pain/burning   Avelox [moxifloxacin Hcl In Nacl] Nausea Only   Cefdinir Nausea And Vomiting   Chromium Other (See Comments)   Blisters   Egg Protein-containing Drug Products    Per allergy testing, no known reaction. Pt states she was told she could probably eat 2-3 eggs weekly w/o issue.   Egg Solids, Whole Other (See Comments)   Per allergy testing, no known reaction. Pt states she was told she could probably eat 2-3 eggs weekly w/o issue.   Iodinated Contrast Media Other (See Comments)   unknown   Lactose Intolerance (gi) Other (See Comments)   stomach issues   Percodan [oxycodone -aspirin] Nausea And Vomiting   Prednisone  Palpitations   No injections, can do the pills   Troleandomycin Other (See Comments)   Unknown reaction        Medication List        Accurate as of December 14, 2023  2:53 PM. If you have any questions, ask your nurse or doctor.  ALPRAZolam  0.25 MG tablet Commonly known as: XANAX  TAKE 1 TABLET(0.25 MG) BY MOUTH TWICE DAILY AS NEEDED FOR ANXIETY   atorvastatin  10 MG tablet Commonly known as: LIPITOR Take 1 tablet (10 mg total) by mouth daily.   butalbital-acetaminophen -caffeine 50-325-40 MG tablet Commonly known as: FIORICET Take 1-2 tablets by mouth 2 (two) times daily as needed for headache (or leg pain).   FLUoxetine  20 MG capsule Commonly known as: PROZAC  Take 1 capsule (20 mg total) by mouth daily.   ibuprofen 200 MG tablet Commonly known as: ADVIL Take 200 mg by mouth every 6 (six) hours as needed.   MAGNESIUM  GLYCINATE PO Take 50 mg by mouth daily.   montelukast  10 MG tablet Commonly known as: SINGULAIR  TAKE 1 TABLET(10 MG) BY MOUTH AT BEDTIME   nebivolol  10 MG tablet Commonly known as: BYSTOLIC  Take 1 tablet (10 mg total) by mouth daily.   NP Thyroid  15 MG tablet Generic drug: thyroid  Take 45 mg by mouth every morning. What changed: Another medication with the same name was removed. Continue taking this medication, and follow the directions you see here. Changed by: Lauraine Pepper   pantoprazole  40 MG tablet Commonly known as: Protonix  Take 1 tablet (40 mg total) by mouth daily.   Prednicarbate 0.1 % Crea Apply 1 Application topically as needed for itching (affected sites).   Refresh Tears PF 0.5-0.9 % Soln Generic drug: Carboxymethylcell-Glycerin PF Place 1 drop into both eyes 3 (three) times daily as needed (for irritation).   sodium chloride  0.65 % nasal spray Commonly known as: V-R NASAL SPRAY SALINE Place 1 spray into the nose as needed. Use in both nostrils twice daily   spironolactone  25 MG tablet Commonly known as: ALDACTONE  TAKE 1/2 TABLET(12.5 MG) BY MOUTH DAILY   traMADol  50 MG tablet Commonly known as: ULTRAM  Take 1 tablet (50 mg total) by mouth every 6 (six) hours as needed for moderate pain (pain score 4-6) or severe pain (pain score 7-10).   valsartan  320 MG tablet Commonly known as: DIOVAN  TAKE 1 TABLET(320 MG) BY MOUTH DAILY   vitamin C 1000 MG tablet Take 1,000 mg by mouth daily.   Vitamin D3 125 MCG (5000 UT) Caps Take 5,000 Units by mouth daily.        Allergies:  Allergies  Allergen Reactions   Cephalosporins Shortness Of Breath and Other (See Comments)    Breathing difficulty   Ciprofloxacin Hives and Shortness Of Breath   Influenza Virus Vaccine Other (See Comments)    Numbness in legs   Keflex  [Cephalexin ] Hives   Latex Hives, Itching and Rash   Levaquin [Levofloxacin] Hives, Palpitations and Other (See Comments)    Chest pain    Nitrofuran Derivatives Hives, Itching and Swelling   Penicillins Hives   Sulfacetamide Sodium Hives   Tape Itching and Other (See Comments)    RED AND IRRITATED SKIN   Xopenex  [Levalbuterol  Hcl] Anxiety and Other (See Comments)    Tremors with violent body chattering and shaking   Zostavax [Zoster Vaccine Live] Other (See Comments)    Pt has been told by specialists not to receive vaccine d/t h/o herpes in bloodstream and shingles.   Erythromycin Other (See Comments)    stomach issues   Hydrochlorothiazide  Other (See Comments)    Migraine   Lisinopril Cough   Petrolatum Nausea Only   Pregabalin Other (See Comments)   Ropinirole  Hcl Nausea And Vomiting   Shellfish Allergy Hives   Sulfa Antibiotics Hives   Golimumab  Other (  See Comments)    Felt poorly (Simponi )   Ibuprofen Other (See Comments)    Stomach upset   Lactose Other (See Comments)    stomach issues  Other Reaction(s): GI Intolerance   Aspirin Other (See Comments)    Stomach pain/burning   Avelox [Moxifloxacin Hcl In Nacl] Nausea Only   Cefdinir Nausea And Vomiting   Chromium Other (See Comments)    Blisters   Egg Protein-Containing Drug Products     Per allergy testing, no known reaction. Pt states she was told she could probably eat 2-3 eggs weekly w/o issue.   Egg Solids, Whole Other (See Comments)    Per allergy testing, no known reaction. Pt states she was told she could probably eat 2-3 eggs weekly w/o issue.   Iodinated Contrast Media Other (See Comments)    unknown    Lactose Intolerance (Gi) Other (See Comments)    stomach issues   Percodan [Oxycodone -Aspirin] Nausea And Vomiting   Prednisone  Palpitations    No injections, can do the pills   Troleandomycin Other (See Comments)    Unknown reaction    Past Medical History, Surgical history, Social history, and Family History were reviewed and updated.  Review of Systems: All other 10 point review of systems is negative.   Physical Exam:   vitals were not taken for this visit.   Wt Readings from Last 3 Encounters:  11/19/23 181 lb (82.1 kg)  11/18/23 181 lb (82.1 kg)  10/16/23 179 lb 2 oz (81.3 kg)    Ocular: Sclerae unicteric, pupils equal, round and reactive to light Ear-nose-throat: Oropharynx clear, dentition fair Lymphatic: No cervical or supraclavicular adenopathy Lungs no rales or rhonchi, good excursion bilaterally Heart regular rate and rhythm, no murmur appreciated Abd soft, nontender, positive bowel sounds MSK no focal spinal tenderness, no joint edema Neuro: non-focal, well-oriented, appropriate affect Breasts: Deferred   Lab Results  Component Value Date   WBC 6.2 11/19/2023   HGB 8.0 (L) 11/19/2023   HCT 25.7 (L) 11/19/2023   MCV 80.8 11/19/2023   PLT 254 11/19/2023   Lab Results  Component Value Date   FERRITIN 5.6 (L) 11/18/2023   IRON  10 (L) 11/19/2023   TIBC 566 (H) 11/19/2023   UIBC 556 11/19/2023   IRONPCTSAT 2 (L) 11/19/2023   Lab Results  Component Value Date   RETICCTPCT 2.7 11/19/2023   RBC 3.18 (L) 11/19/2023   RBC 3.00 (L) 11/19/2023   No results found for: KPAFRELGTCHN, LAMBDASER, KAPLAMBRATIO No results found for: IGGSERUM, IGA, IGMSERUM No results found for: STEPHANY CARLOTA BENSON MARKEL EARLA JOANNIE DOC, MSPIKE, SPEI   Chemistry      Component Value Date/Time   NA 134 (L) 09/22/2023 1516   NA 133 (L) 02/16/2023 1341   K 4.8 09/22/2023 1516   CL 98 09/22/2023 1516   CO2 26 09/22/2023 1516   BUN 14 09/22/2023 1516   BUN 15 02/16/2023 1341   CREATININE 0.67 09/22/2023 1516   CREATININE 0.70 05/01/2021 1430   CREATININE 0.76 12/07/2019 1413   GLU 80 11/18/2019 0000      Component Value Date/Time   CALCIUM  9.2 09/22/2023 1516   ALKPHOS 71 08/13/2023 1616   AST 18 08/13/2023 1616   AST 18 05/01/2021 1430   ALT 17 08/13/2023 1616   ALT 16 05/01/2021 1430   BILITOT 0.6 08/13/2023 1616   BILITOT 0.3 01/22/2023 1602    BILITOT 0.4 05/01/2021 1430       Impression and Plan: Ms. Detert  is a very pleasant 83 yo caucasian female with history of iron  deficiency anemia.  Iron  studies are pending. We will replace if needed.  Hgb 10.0, no blood transfusion needed at this time.  Anemia and MDS panel pending.  Follow-up in another 3 weeks.   Lauraine Pepper, NP 10/27/20252:53 PM

## 2023-12-15 ENCOUNTER — Ambulatory Visit: Admitting: Medical Oncology

## 2023-12-15 ENCOUNTER — Encounter: Payer: Self-pay | Admitting: Family

## 2023-12-15 ENCOUNTER — Other Ambulatory Visit

## 2023-12-15 ENCOUNTER — Ambulatory Visit: Payer: Medicare Other

## 2023-12-16 DIAGNOSIS — R293 Abnormal posture: Secondary | ICD-10-CM | POA: Diagnosis not present

## 2023-12-16 DIAGNOSIS — M7061 Trochanteric bursitis, right hip: Secondary | ICD-10-CM | POA: Diagnosis not present

## 2023-12-16 DIAGNOSIS — R5383 Other fatigue: Secondary | ICD-10-CM | POA: Diagnosis not present

## 2023-12-16 DIAGNOSIS — R269 Unspecified abnormalities of gait and mobility: Secondary | ICD-10-CM | POA: Diagnosis not present

## 2023-12-16 DIAGNOSIS — M2560 Stiffness of unspecified joint, not elsewhere classified: Secondary | ICD-10-CM | POA: Diagnosis not present

## 2023-12-16 DIAGNOSIS — M1990 Unspecified osteoarthritis, unspecified site: Secondary | ICD-10-CM | POA: Diagnosis not present

## 2023-12-17 ENCOUNTER — Ambulatory Visit

## 2023-12-18 NOTE — Progress Notes (Signed)
 Angela Gibbs is here today for follow up post radiation to the breast.   Breast Side:Right      They completed their radiation on: 11/30/2023   Does the patient complain of any of the following: Post radiation skin issues: None. Breast Tenderness: None Breast Swelling: None Lymphadema: None Range of Motion limitations: None. Going to physical therapy 2 times a week. Fatigue post radiation: None Appetite good/fair/poor: Good  Additional comments if applicable:  Encouraged patient to call our office with any questions or concerns.

## 2023-12-21 DIAGNOSIS — Z7409 Other reduced mobility: Secondary | ICD-10-CM | POA: Diagnosis not present

## 2023-12-21 DIAGNOSIS — G500 Trigeminal neuralgia: Secondary | ICD-10-CM | POA: Diagnosis not present

## 2023-12-21 DIAGNOSIS — M2560 Stiffness of unspecified joint, not elsewhere classified: Secondary | ICD-10-CM | POA: Diagnosis not present

## 2023-12-21 DIAGNOSIS — Z923 Personal history of irradiation: Secondary | ICD-10-CM | POA: Diagnosis not present

## 2023-12-21 DIAGNOSIS — M7061 Trochanteric bursitis, right hip: Secondary | ICD-10-CM | POA: Diagnosis not present

## 2023-12-21 DIAGNOSIS — Z9109 Other allergy status, other than to drugs and biological substances: Secondary | ICD-10-CM | POA: Diagnosis not present

## 2023-12-21 DIAGNOSIS — R531 Weakness: Secondary | ICD-10-CM | POA: Diagnosis not present

## 2023-12-21 DIAGNOSIS — Z853 Personal history of malignant neoplasm of breast: Secondary | ICD-10-CM | POA: Diagnosis not present

## 2023-12-21 DIAGNOSIS — K219 Gastro-esophageal reflux disease without esophagitis: Secondary | ICD-10-CM | POA: Diagnosis not present

## 2023-12-21 DIAGNOSIS — J45909 Unspecified asthma, uncomplicated: Secondary | ICD-10-CM | POA: Diagnosis not present

## 2023-12-21 DIAGNOSIS — M1990 Unspecified osteoarthritis, unspecified site: Secondary | ICD-10-CM | POA: Diagnosis not present

## 2023-12-21 DIAGNOSIS — L4050 Arthropathic psoriasis, unspecified: Secondary | ICD-10-CM | POA: Diagnosis not present

## 2023-12-21 DIAGNOSIS — R269 Unspecified abnormalities of gait and mobility: Secondary | ICD-10-CM | POA: Diagnosis not present

## 2023-12-21 DIAGNOSIS — M4316 Spondylolisthesis, lumbar region: Secondary | ICD-10-CM | POA: Diagnosis not present

## 2023-12-21 DIAGNOSIS — R293 Abnormal posture: Secondary | ICD-10-CM | POA: Diagnosis not present

## 2023-12-21 DIAGNOSIS — I10 Essential (primary) hypertension: Secondary | ICD-10-CM | POA: Diagnosis not present

## 2023-12-21 DIAGNOSIS — R5383 Other fatigue: Secondary | ICD-10-CM | POA: Diagnosis not present

## 2023-12-21 DIAGNOSIS — Z96641 Presence of right artificial hip joint: Secondary | ICD-10-CM | POA: Diagnosis not present

## 2023-12-21 DIAGNOSIS — M4185 Other forms of scoliosis, thoracolumbar region: Secondary | ICD-10-CM | POA: Diagnosis not present

## 2023-12-21 DIAGNOSIS — M47816 Spondylosis without myelopathy or radiculopathy, lumbar region: Secondary | ICD-10-CM | POA: Diagnosis not present

## 2023-12-21 DIAGNOSIS — M51369 Other intervertebral disc degeneration, lumbar region without mention of lumbar back pain or lower extremity pain: Secondary | ICD-10-CM | POA: Diagnosis not present

## 2023-12-21 DIAGNOSIS — M25659 Stiffness of unspecified hip, not elsewhere classified: Secondary | ICD-10-CM | POA: Diagnosis not present

## 2023-12-21 DIAGNOSIS — C50919 Malignant neoplasm of unspecified site of unspecified female breast: Secondary | ICD-10-CM | POA: Diagnosis not present

## 2023-12-21 DIAGNOSIS — M8580 Other specified disorders of bone density and structure, unspecified site: Secondary | ICD-10-CM | POA: Diagnosis not present

## 2023-12-21 LAB — MYELODYSPLASTIC SYNDROME (MDS), FISH PANEL

## 2023-12-23 DIAGNOSIS — G4489 Other headache syndrome: Secondary | ICD-10-CM | POA: Diagnosis not present

## 2023-12-23 DIAGNOSIS — B0229 Other postherpetic nervous system involvement: Secondary | ICD-10-CM | POA: Diagnosis not present

## 2023-12-23 DIAGNOSIS — M62838 Other muscle spasm: Secondary | ICD-10-CM | POA: Diagnosis not present

## 2023-12-23 DIAGNOSIS — G894 Chronic pain syndrome: Secondary | ICD-10-CM | POA: Diagnosis not present

## 2023-12-27 ENCOUNTER — Other Ambulatory Visit: Payer: Self-pay | Admitting: Family Medicine

## 2023-12-27 DIAGNOSIS — I10 Essential (primary) hypertension: Secondary | ICD-10-CM

## 2023-12-29 ENCOUNTER — Ambulatory Visit: Payer: Self-pay | Admitting: *Deleted

## 2023-12-29 DIAGNOSIS — R269 Unspecified abnormalities of gait and mobility: Secondary | ICD-10-CM | POA: Diagnosis not present

## 2023-12-29 DIAGNOSIS — M2560 Stiffness of unspecified joint, not elsewhere classified: Secondary | ICD-10-CM | POA: Diagnosis not present

## 2023-12-29 DIAGNOSIS — R293 Abnormal posture: Secondary | ICD-10-CM | POA: Diagnosis not present

## 2023-12-29 DIAGNOSIS — M7061 Trochanteric bursitis, right hip: Secondary | ICD-10-CM | POA: Diagnosis not present

## 2023-12-29 DIAGNOSIS — R5383 Other fatigue: Secondary | ICD-10-CM | POA: Diagnosis not present

## 2023-12-29 DIAGNOSIS — M1990 Unspecified osteoarthritis, unspecified site: Secondary | ICD-10-CM | POA: Diagnosis not present

## 2023-12-29 NOTE — Telephone Encounter (Signed)
-----   Message from Lauraine Pepper sent at 12/29/2023 12:33 PM EST ----- MDS panel was normal. No abnormal genetic mutations noted. This is great news!  Sarah ----- Message ----- From: Tonette Lauraine CHRISTELLA DEVONNA Sent: 12/28/2023   3:34 PM EST To: Lauraine CHRISTELLA Pepper, NP   ----- Message ----- From: Rebecka, [facility] Sent: 12/14/2023   3:06 PM EST To: Lauraine CHRISTELLA Tonette, PA-C

## 2023-12-31 ENCOUNTER — Ambulatory Visit
Admission: RE | Admit: 2023-12-31 | Discharge: 2023-12-31 | Disposition: A | Discharge status: Home / Self Care | Source: Ambulatory Visit | Attending: Radiation Oncology | Admitting: Radiation Oncology

## 2023-12-31 DIAGNOSIS — C50311 Malignant neoplasm of lower-inner quadrant of right female breast: Secondary | ICD-10-CM

## 2024-01-04 ENCOUNTER — Inpatient Hospital Stay: Admitting: Family

## 2024-01-04 ENCOUNTER — Inpatient Hospital Stay

## 2024-01-05 ENCOUNTER — Inpatient Hospital Stay: Attending: Hematology & Oncology

## 2024-01-05 ENCOUNTER — Inpatient Hospital Stay (HOSPITAL_BASED_OUTPATIENT_CLINIC_OR_DEPARTMENT_OTHER): Admitting: Family

## 2024-01-05 ENCOUNTER — Encounter: Payer: Self-pay | Admitting: Family

## 2024-01-05 ENCOUNTER — Other Ambulatory Visit: Payer: Self-pay

## 2024-01-05 VITALS — BP 104/70 | HR 55 | Temp 98.2°F | Resp 19 | Wt 179.0 lb

## 2024-01-05 DIAGNOSIS — D649 Anemia, unspecified: Secondary | ICD-10-CM

## 2024-01-05 DIAGNOSIS — R293 Abnormal posture: Secondary | ICD-10-CM | POA: Diagnosis not present

## 2024-01-05 DIAGNOSIS — Z886 Allergy status to analgesic agent status: Secondary | ICD-10-CM | POA: Diagnosis not present

## 2024-01-05 DIAGNOSIS — R5383 Other fatigue: Secondary | ICD-10-CM | POA: Diagnosis not present

## 2024-01-05 DIAGNOSIS — Z79899 Other long term (current) drug therapy: Secondary | ICD-10-CM | POA: Insufficient documentation

## 2024-01-05 DIAGNOSIS — D500 Iron deficiency anemia secondary to blood loss (chronic): Secondary | ICD-10-CM | POA: Diagnosis not present

## 2024-01-05 DIAGNOSIS — Z91041 Radiographic dye allergy status: Secondary | ICD-10-CM | POA: Diagnosis not present

## 2024-01-05 DIAGNOSIS — Z881 Allergy status to other antibiotic agents status: Secondary | ICD-10-CM | POA: Diagnosis not present

## 2024-01-05 DIAGNOSIS — Z91012 Allergy to eggs, unspecified: Secondary | ICD-10-CM | POA: Insufficient documentation

## 2024-01-05 DIAGNOSIS — D509 Iron deficiency anemia, unspecified: Secondary | ICD-10-CM | POA: Diagnosis present

## 2024-01-05 DIAGNOSIS — R0602 Shortness of breath: Secondary | ICD-10-CM | POA: Diagnosis not present

## 2024-01-05 DIAGNOSIS — Z882 Allergy status to sulfonamides status: Secondary | ICD-10-CM | POA: Insufficient documentation

## 2024-01-05 DIAGNOSIS — Z880 Allergy status to penicillin: Secondary | ICD-10-CM | POA: Diagnosis not present

## 2024-01-05 DIAGNOSIS — M7061 Trochanteric bursitis, right hip: Secondary | ICD-10-CM | POA: Diagnosis not present

## 2024-01-05 DIAGNOSIS — Z885 Allergy status to narcotic agent status: Secondary | ICD-10-CM | POA: Insufficient documentation

## 2024-01-05 DIAGNOSIS — Z888 Allergy status to other drugs, medicaments and biological substances status: Secondary | ICD-10-CM | POA: Insufficient documentation

## 2024-01-05 DIAGNOSIS — M7071 Other bursitis of hip, right hip: Secondary | ICD-10-CM | POA: Diagnosis not present

## 2024-01-05 DIAGNOSIS — M2560 Stiffness of unspecified joint, not elsewhere classified: Secondary | ICD-10-CM | POA: Diagnosis not present

## 2024-01-05 DIAGNOSIS — Z923 Personal history of irradiation: Secondary | ICD-10-CM | POA: Insufficient documentation

## 2024-01-05 DIAGNOSIS — Z887 Allergy status to serum and vaccine status: Secondary | ICD-10-CM | POA: Insufficient documentation

## 2024-01-05 DIAGNOSIS — R200 Anesthesia of skin: Secondary | ICD-10-CM | POA: Insufficient documentation

## 2024-01-05 DIAGNOSIS — R269 Unspecified abnormalities of gait and mobility: Secondary | ICD-10-CM | POA: Diagnosis not present

## 2024-01-05 DIAGNOSIS — Z91040 Latex allergy status: Secondary | ICD-10-CM | POA: Diagnosis not present

## 2024-01-05 DIAGNOSIS — R202 Paresthesia of skin: Secondary | ICD-10-CM | POA: Insufficient documentation

## 2024-01-05 DIAGNOSIS — M1990 Unspecified osteoarthritis, unspecified site: Secondary | ICD-10-CM | POA: Diagnosis not present

## 2024-01-05 LAB — CBC WITH DIFFERENTIAL (CANCER CENTER ONLY)
Abs Immature Granulocytes: 0.04 K/uL (ref 0.00–0.07)
Basophils Absolute: 0 K/uL (ref 0.0–0.1)
Basophils Relative: 1 %
Eosinophils Absolute: 0.3 K/uL (ref 0.0–0.5)
Eosinophils Relative: 6 %
HCT: 27 % — ABNORMAL LOW (ref 36.0–46.0)
Hemoglobin: 8.7 g/dL — ABNORMAL LOW (ref 12.0–15.0)
Immature Granulocytes: 1 %
Lymphocytes Relative: 17 %
Lymphs Abs: 0.8 K/uL (ref 0.7–4.0)
MCH: 28.3 pg (ref 26.0–34.0)
MCHC: 32.2 g/dL (ref 30.0–36.0)
MCV: 87.9 fL (ref 80.0–100.0)
Monocytes Absolute: 0.6 K/uL (ref 0.1–1.0)
Monocytes Relative: 11 %
Neutro Abs: 3.3 K/uL (ref 1.7–7.7)
Neutrophils Relative %: 64 %
Platelet Count: 258 K/uL (ref 150–400)
RBC: 3.07 MIL/uL — ABNORMAL LOW (ref 3.87–5.11)
RDW: 20 % — ABNORMAL HIGH (ref 11.5–15.5)
WBC Count: 5.1 K/uL (ref 4.0–10.5)
nRBC: 0 % (ref 0.0–0.2)

## 2024-01-05 LAB — IRON AND IRON BINDING CAPACITY (CC-WL,HP ONLY)
Iron: 25 ug/dL — ABNORMAL LOW (ref 28–170)
Saturation Ratios: 6 % — ABNORMAL LOW (ref 10.4–31.8)
TIBC: 430 ug/dL (ref 250–450)
UIBC: 405 ug/dL

## 2024-01-05 LAB — CMP (CANCER CENTER ONLY)
ALT: 16 U/L (ref 0–44)
AST: 19 U/L (ref 15–41)
Albumin: 3.9 g/dL (ref 3.5–5.0)
Alkaline Phosphatase: 90 U/L (ref 38–126)
Anion gap: 10 (ref 5–15)
BUN: 17 mg/dL (ref 8–23)
CO2: 24 mmol/L (ref 22–32)
Calcium: 9 mg/dL (ref 8.9–10.3)
Chloride: 102 mmol/L (ref 98–111)
Creatinine: 0.6 mg/dL (ref 0.44–1.00)
GFR, Estimated: >60 mL/min (ref >60)
Glucose, Bld: 93 mg/dL (ref 70–99)
Potassium: 4.4 mmol/L (ref 3.5–5.1)
Sodium: 136 mmol/L (ref 135–145)
Total Bilirubin: <0.2 mg/dL (ref 0.0–1.2)
Total Protein: 6.4 g/dL — ABNORMAL LOW (ref 6.5–8.1)

## 2024-01-05 LAB — FERRITIN: Ferritin: 43 ng/mL (ref 11–307)

## 2024-01-05 LAB — SAMPLE TO BLOOD BANK

## 2024-01-05 LAB — RETICULOCYTES
Immature Retic Fract: 18.9 % — ABNORMAL HIGH (ref 2.3–15.9)
RBC.: 3.05 MIL/uL — ABNORMAL LOW (ref 3.87–5.11)
Retic Count, Absolute: 72.3 K/uL (ref 19.0–186.0)
Retic Ct Pct: 2.4 % (ref 0.4–3.1)

## 2024-01-05 NOTE — Progress Notes (Signed)
 Hematology and Oncology Follow Up Visit  Angela Gibbs [id number] [birth year] 83 y.o. 01/05/2024   Principle Diagnosis:  Iron  deficiency anemia MDS panel was negative   Current Therapy:        IV iron  as indicated    Interim History:  [name] is here today with her husband for follow-up. She is doing well and has no new complaints at this time.  She denies fatigue right now.  She has some SOB with exertion and will take a break to rest when needed. She states that she has had this since she stopped taking Fentanyl  for her bursitis in the right hip.  She is doing PT 2 days a week for the bursitis.  She has had dark stools off and on. None today.  No abnormal bruising, no petechiae.  Hgb is down at 8.7, MCV 87, platelets 258 and WBC count is 5.1 She finished radiation for her breast cancer in October. She has a survivorship appointment with Manuelita Kendall, NP in January.  No fever, chills, n/v, cough, rash, dizziness, chest pain, palpitations, abdominal pain or changes in bowel or bladder habits.  No swelling in her extremities.  She has numbness and tingling  that comes and goes in the left middle, ring and pinky fingers.  No falls or syncope reported.  Appetite and hydration are good. Weight is stable at 179 lbs.   ECOG Performance Status: 1 - Symptomatic but completely ambulatory  Medications:  Allergies as of 01/05/2024       Reactions   Cephalosporins Shortness Of Breath, Other (See Comments)   Breathing difficulty   Ciprofloxacin Hives, Shortness Of Breath   Influenza Virus Vaccine Other (See Comments)   Numbness in legs   Keflex  [cephalexin ] Hives   Latex Hives, Itching, Rash   Levaquin [levofloxacin] Hives, Palpitations, Other (See Comments)   Chest pain   Nitrofuran Derivatives Hives, Itching, Swelling   Penicillins Hives   Sulfacetamide Sodium Hives   Tape Itching, Other (See Comments)   RED AND IRRITATED SKIN   Xopenex  [levalbuterol  Hcl] Anxiety, Other  (See Comments)   Tremors with violent body chattering and shaking   Zostavax [zoster Vaccine Live] Other (See Comments)   Pt has been told by specialists not to receive vaccine d/t h/o herpes in bloodstream and shingles.   Erythromycin Other (See Comments)   stomach issues   Hydrochlorothiazide  Other (See Comments)   Migraine   Lisinopril Cough   Petrolatum Nausea Only   Pregabalin Other (See Comments)   Ropinirole  Hcl Nausea And Vomiting   Shellfish Allergy Hives   Sulfa Antibiotics Hives   Golimumab  Other (See Comments)   Felt poorly (Simponi )   Ibuprofen Other (See Comments)   Stomach upset   Lactose Other (See Comments)   stomach issues Other Reaction(s): GI Intolerance   Aspirin Other (See Comments)   Stomach pain/burning   Avelox [moxifloxacin Hcl In Nacl] Nausea Only   Cefdinir Nausea And Vomiting   Chromium Other (See Comments)   Blisters   Egg Protein-containing Drug Products    Per allergy testing, no known reaction. Pt states she was told she could probably eat 2-3 eggs weekly w/o issue.   Egg Solids, Whole Other (See Comments)   Per allergy testing, no known reaction. Pt states she was told she could probably eat 2-3 eggs weekly w/o issue.   Iodinated Contrast Media Other (See Comments)   unknown   Lactose Intolerance (gi) Other (See Comments)   stomach issues  Percodan [oxycodone -aspirin] Nausea And Vomiting   Prednisone  Palpitations   No injections, can do the pills   Troleandomycin Other (See Comments)   Unknown reaction        Medication List        Accurate as of January 05, 2024  2:17 PM. If you have any questions, ask your nurse or doctor.          ALPRAZolam  0.25 MG tablet Commonly known as: XANAX  TAKE 1 TABLET(0.25 MG) BY MOUTH TWICE DAILY AS NEEDED FOR ANXIETY   atorvastatin  10 MG tablet Commonly known as: LIPITOR Take 1 tablet (10 mg total) by mouth daily.   butalbital-acetaminophen -caffeine 50-325-40 MG tablet Commonly  known as: FIORICET Take 1-2 tablets by mouth 2 (two) times daily as needed for headache (or leg pain).   FLUoxetine  20 MG capsule Commonly known as: PROZAC  Take 1 capsule (20 mg total) by mouth daily.   ibuprofen 200 MG tablet Commonly known as: ADVIL Take 200 mg by mouth every 6 (six) hours as needed.   MAGNESIUM GLYCINATE PO Take 50 mg by mouth daily.   montelukast  10 MG tablet Commonly known as: SINGULAIR  TAKE 1 TABLET(10 MG) BY MOUTH AT BEDTIME   nebivolol  10 MG tablet Commonly known as: BYSTOLIC  Take 1 tablet (10 mg total) by mouth daily.   NP Thyroid  15 MG tablet Generic drug: thyroid  Take 45 mg by mouth every morning.   pantoprazole  40 MG tablet Commonly known as: Protonix  Take 1 tablet (40 mg total) by mouth daily.   Prednicarbate 0.1 % Crea Apply 1 Application topically as needed for itching (affected sites).   Refresh Tears PF 0.5-0.9 % Soln Generic drug: Carboxymethylcell-Glycerin PF Place 1 drop into both eyes 3 (three) times daily as needed (for irritation).   sodium chloride  0.65 % nasal spray Commonly known as: V-R NASAL SPRAY SALINE Place 1 spray into the nose as needed. Use in both nostrils twice daily   spironolactone  25 MG tablet Commonly known as: ALDACTONE  TAKE 1/2 TABLET(12.5 MG) BY MOUTH DAILY   traMADol  50 MG tablet Commonly known as: ULTRAM  Take 1 tablet (50 mg total) by mouth every 6 (six) hours as needed for moderate pain (pain score 4-6) or severe pain (pain score 7-10).   valsartan  320 MG tablet Commonly known as: DIOVAN  TAKE 1 TABLET(320 MG) BY MOUTH DAILY   vitamin C 1000 MG tablet Take 1,000 mg by mouth daily.   Vitamin D3 125 MCG (5000 UT) Caps Take 5,000 Units by mouth daily.        Allergies:  Allergies  Allergen Reactions   Cephalosporins Shortness Of Breath and Other (See Comments)    Breathing difficulty   Ciprofloxacin Hives and Shortness Of Breath   Influenza Virus Vaccine Other (See Comments)    Numbness in  legs   Keflex  [Cephalexin ] Hives   Latex Hives, Itching and Rash   Levaquin [Levofloxacin] Hives, Palpitations and Other (See Comments)    Chest pain   Nitrofuran Derivatives Hives, Itching and Swelling   Penicillins Hives   Sulfacetamide Sodium Hives   Tape Itching and Other (See Comments)    RED AND IRRITATED SKIN   Xopenex  [Levalbuterol  Hcl] Anxiety and Other (See Comments)    Tremors with violent body chattering and shaking   Zostavax [Zoster Vaccine Live] Other (See Comments)    Pt has been told by specialists not to receive vaccine d/t h/o herpes in bloodstream and shingles.   Erythromycin Other (See Comments)    stomach issues  Hydrochlorothiazide  Other (See Comments)    Migraine   Lisinopril Cough   Petrolatum Nausea Only   Pregabalin Other (See Comments)   Ropinirole  Hcl Nausea And Vomiting   Shellfish Allergy Hives   Sulfa Antibiotics Hives   Golimumab  Other (See Comments)    Felt poorly (Simponi )   Ibuprofen Other (See Comments)    Stomach upset   Lactose Other (See Comments)    stomach issues  Other Reaction(s): GI Intolerance   Aspirin Other (See Comments)    Stomach pain/burning   Avelox [Moxifloxacin Hcl In Nacl] Nausea Only   Cefdinir Nausea And Vomiting   Chromium Other (See Comments)    Blisters   Egg Protein-Containing Drug Products     Per allergy testing, no known reaction. Pt states she was told she could probably eat 2-3 eggs weekly w/o issue.   Egg Solids, Whole Other (See Comments)    Per allergy testing, no known reaction. Pt states she was told she could probably eat 2-3 eggs weekly w/o issue.   Iodinated Contrast Media Other (See Comments)    unknown    Lactose Intolerance (Gi) Other (See Comments)    stomach issues   Percodan [Oxycodone -Aspirin] Nausea And Vomiting   Prednisone  Palpitations    No injections, can do the pills   Troleandomycin Other (See Comments)    Unknown reaction    Past Medical History, Surgical history,  Social history, and Family History were reviewed and updated.  Review of Systems: All other 10 point review of systems is negative.   Physical Exam:  vitals were not taken for this visit.   Wt Readings from Last 3 Encounters:  12/14/23 180 lb (81.6 kg)  11/19/23 181 lb (82.1 kg)  11/18/23 181 lb (82.1 kg)    Ocular: Sclerae unicteric, pupils equal, round and reactive to light Ear-nose-throat: Oropharynx clear, dentition fair Lymphatic: No cervical or supraclavicular adenopathy Lungs no rales or rhonchi, good excursion bilaterally Heart regular rate and rhythm, no murmur appreciated Abd soft, nontender, positive bowel sounds MSK no focal spinal tenderness, no joint edema Neuro: non-focal, well-oriented, appropriate affect Breasts: Deferred   Lab Results  Component Value Date   WBC 4.9 12/14/2023   HGB 10.0 (L) 12/14/2023   HCT 30.9 (L) 12/14/2023   MCV 89.8 12/14/2023   PLT 200 12/14/2023   Lab Results  Component Value Date   FERRITIN 142 12/14/2023   IRON  60 12/14/2023   TIBC 370 12/14/2023   UIBC 310 12/14/2023   IRONPCTSAT 16 12/14/2023   Lab Results  Component Value Date   RETICCTPCT 2.1 12/14/2023   RBC 3.45 (L) 12/14/2023   No results found for: KPAFRELGTCHN, LAMBDASER, KAPLAMBRATIO No results found for: IGGSERUM, IGA, IGMSERUM No results found for: STEPHANY CARLOTA BENSON MARKEL EARLA JOANNIE DOC VICK, SPEI   Chemistry      Component Value Date/Time   NA 136 12/14/2023 1429   NA 133 (L) 02/16/2023 1341   K 4.2 12/14/2023 1429   CL 103 12/14/2023 1429   CO2 25 12/14/2023 1429   BUN 14 12/14/2023 1429   BUN 15 02/16/2023 1341   CREATININE 0.57 12/14/2023 1429   CREATININE 0.76 12/07/2019 1413   GLU 80 11/18/2019 0000      Component Value Date/Time   CALCIUM  9.0 12/14/2023 1429   ALKPHOS 77 12/14/2023 1429   AST 18 12/14/2023 1429   ALT 14 12/14/2023 1429   BILITOT 0.3 12/14/2023 1429        Impression and Plan: Ms.  Balin is a very pleasant 83 yo caucasian female with history of iron  deficiency anemia.  Epo level is 64. No ESA needed at this time.  She needs 2 doses of IV iron .  Hgb 8.7, no blood transfusion needed at this time.  Follow-up in another 3 weeks.   Lauraine Pepper, NP 11/18/20252:17 PM

## 2024-01-07 LAB — ERYTHROPOIETIN: Erythropoietin: 64.6 m[IU]/mL — ABNORMAL HIGH (ref 2.6–18.5)

## 2024-01-08 ENCOUNTER — Encounter: Payer: Self-pay | Admitting: Family

## 2024-01-08 DIAGNOSIS — M1990 Unspecified osteoarthritis, unspecified site: Secondary | ICD-10-CM | POA: Diagnosis not present

## 2024-01-08 DIAGNOSIS — R293 Abnormal posture: Secondary | ICD-10-CM | POA: Diagnosis not present

## 2024-01-08 DIAGNOSIS — M7061 Trochanteric bursitis, right hip: Secondary | ICD-10-CM | POA: Diagnosis not present

## 2024-01-08 DIAGNOSIS — R269 Unspecified abnormalities of gait and mobility: Secondary | ICD-10-CM | POA: Diagnosis not present

## 2024-01-08 DIAGNOSIS — R5383 Other fatigue: Secondary | ICD-10-CM | POA: Diagnosis not present

## 2024-01-08 DIAGNOSIS — M2560 Stiffness of unspecified joint, not elsewhere classified: Secondary | ICD-10-CM | POA: Diagnosis not present

## 2024-01-12 ENCOUNTER — Encounter: Payer: Self-pay | Admitting: Family

## 2024-01-12 DIAGNOSIS — R5383 Other fatigue: Secondary | ICD-10-CM | POA: Diagnosis not present

## 2024-01-12 DIAGNOSIS — R293 Abnormal posture: Secondary | ICD-10-CM | POA: Diagnosis not present

## 2024-01-12 DIAGNOSIS — R269 Unspecified abnormalities of gait and mobility: Secondary | ICD-10-CM | POA: Diagnosis not present

## 2024-01-12 DIAGNOSIS — M7061 Trochanteric bursitis, right hip: Secondary | ICD-10-CM | POA: Diagnosis not present

## 2024-01-12 DIAGNOSIS — M1990 Unspecified osteoarthritis, unspecified site: Secondary | ICD-10-CM | POA: Diagnosis not present

## 2024-01-12 DIAGNOSIS — M2560 Stiffness of unspecified joint, not elsewhere classified: Secondary | ICD-10-CM | POA: Diagnosis not present

## 2024-01-19 ENCOUNTER — Ambulatory Visit

## 2024-01-20 ENCOUNTER — Inpatient Hospital Stay: Attending: Hematology & Oncology

## 2024-01-20 VITALS — BP 149/59 | HR 55 | Temp 97.9°F | Resp 18

## 2024-01-20 DIAGNOSIS — Z91041 Radiographic dye allergy status: Secondary | ICD-10-CM | POA: Insufficient documentation

## 2024-01-20 DIAGNOSIS — Z888 Allergy status to other drugs, medicaments and biological substances status: Secondary | ICD-10-CM | POA: Insufficient documentation

## 2024-01-20 DIAGNOSIS — Z885 Allergy status to narcotic agent status: Secondary | ICD-10-CM | POA: Insufficient documentation

## 2024-01-20 DIAGNOSIS — Z887 Allergy status to serum and vaccine status: Secondary | ICD-10-CM | POA: Insufficient documentation

## 2024-01-20 DIAGNOSIS — D509 Iron deficiency anemia, unspecified: Secondary | ICD-10-CM | POA: Diagnosis present

## 2024-01-20 DIAGNOSIS — R5383 Other fatigue: Secondary | ICD-10-CM | POA: Insufficient documentation

## 2024-01-20 DIAGNOSIS — Z886 Allergy status to analgesic agent status: Secondary | ICD-10-CM | POA: Diagnosis not present

## 2024-01-20 DIAGNOSIS — Z91040 Latex allergy status: Secondary | ICD-10-CM | POA: Diagnosis not present

## 2024-01-20 DIAGNOSIS — Z881 Allergy status to other antibiotic agents status: Secondary | ICD-10-CM | POA: Insufficient documentation

## 2024-01-20 DIAGNOSIS — D500 Iron deficiency anemia secondary to blood loss (chronic): Secondary | ICD-10-CM

## 2024-01-20 DIAGNOSIS — Z882 Allergy status to sulfonamides status: Secondary | ICD-10-CM | POA: Insufficient documentation

## 2024-01-20 DIAGNOSIS — Z880 Allergy status to penicillin: Secondary | ICD-10-CM | POA: Insufficient documentation

## 2024-01-20 DIAGNOSIS — M79604 Pain in right leg: Secondary | ICD-10-CM | POA: Diagnosis not present

## 2024-01-20 DIAGNOSIS — Z91012 Allergy to eggs, unspecified: Secondary | ICD-10-CM | POA: Diagnosis not present

## 2024-01-20 DIAGNOSIS — Z79899 Other long term (current) drug therapy: Secondary | ICD-10-CM | POA: Insufficient documentation

## 2024-01-20 MED ORDER — SODIUM CHLORIDE 0.9 % IV SOLN: Freq: Once | INTRAVENOUS | Status: AC

## 2024-01-20 MED ORDER — SODIUM CHLORIDE 0.9 % IV SOLN
510.0000 mg | Freq: Once | INTRAVENOUS | Status: AC
  Administered 2024-01-20: 510 mg via INTRAVENOUS
  Filled 2024-01-20: qty 17

## 2024-01-20 NOTE — Patient Instructions (Signed)

## 2024-01-25 ENCOUNTER — Inpatient Hospital Stay

## 2024-01-25 ENCOUNTER — Other Ambulatory Visit: Payer: Self-pay | Admitting: Family

## 2024-01-25 ENCOUNTER — Other Ambulatory Visit: Payer: Self-pay | Admitting: *Deleted

## 2024-01-25 ENCOUNTER — Ambulatory Visit: Payer: Self-pay

## 2024-01-25 ENCOUNTER — Telehealth: Payer: Self-pay | Admitting: *Deleted

## 2024-01-25 DIAGNOSIS — D649 Anemia, unspecified: Secondary | ICD-10-CM

## 2024-01-25 DIAGNOSIS — D509 Iron deficiency anemia, unspecified: Secondary | ICD-10-CM | POA: Diagnosis not present

## 2024-01-25 DIAGNOSIS — D500 Iron deficiency anemia secondary to blood loss (chronic): Secondary | ICD-10-CM

## 2024-01-25 LAB — CBC WITH DIFFERENTIAL (CANCER CENTER ONLY)
Abs Immature Granulocytes: 0.04 K/uL (ref 0.00–0.07)
Basophils Absolute: 0 K/uL (ref 0.0–0.1)
Basophils Relative: 1 %
Eosinophils Absolute: 0.5 K/uL (ref 0.0–0.5)
Eosinophils Relative: 8 %
HCT: 27.1 % — ABNORMAL LOW (ref 36.0–46.0)
Hemoglobin: 8.5 g/dL — ABNORMAL LOW (ref 12.0–15.0)
Immature Granulocytes: 1 %
Lymphocytes Relative: 12 %
Lymphs Abs: 0.7 K/uL (ref 0.7–4.0)
MCH: 27.4 pg (ref 26.0–34.0)
MCHC: 31.4 g/dL (ref 30.0–36.0)
MCV: 87.4 fL (ref 80.0–100.0)
Monocytes Absolute: 0.7 K/uL (ref 0.1–1.0)
Monocytes Relative: 13 %
Neutro Abs: 3.6 K/uL (ref 1.7–7.7)
Neutrophils Relative %: 65 %
Platelet Count: 238 K/uL (ref 150–400)
RBC: 3.1 MIL/uL — ABNORMAL LOW (ref 3.87–5.11)
RDW: 23.8 % — ABNORMAL HIGH (ref 11.5–15.5)
WBC Count: 5.5 K/uL (ref 4.0–10.5)
nRBC: 0 % (ref 0.0–0.2)

## 2024-01-25 LAB — SAMPLE TO BLOOD BANK

## 2024-01-25 LAB — PREPARE RBC (CROSSMATCH)

## 2024-01-25 NOTE — Telephone Encounter (Signed)
 FYI Only or Action Required?: FYI only for provider: ED advised.  Patient was last seen in primary care on 11/18/2023 by Copland, [name], MD.  Called Nurse Triage reporting Fatigue.  Symptoms began yesterday.  Interventions attempted: Nothing.  Symptoms are: rapidly worsening.  Triage Disposition: Call EMS 911 Now  Patient/caregiver understands and will follow disposition?: No, refuses disposition Called [facility] to inform of ED refusal.                   Copied from CRM #[id number]. Topic: Clinical - Red Word Triage >> Jan 25, 2024 11:55 AM Macario HERO wrote: Red Word that prompted transfer to Nurse Triage: Patient called said that she is not feeling well and she felt the same way in February and when she saw Dr. Watt she was advised to get to the ER. She is having similar symptoms and feeling weak, and lack of energy to move. Reason for Disposition  [1] SEVERE weakness (e.g., unable to walk or barely able to walk, requires support) AND [2] new-onset or getting worse  Answer Assessment - Initial Assessment Questions 1. DESCRIPTION: Describe how you are feeling.     Very weak - cannot walk across the floor - states she can crawl. Using crutches 2. SEVERITY: How bad is it?  Can you stand and walk?     severe 3. ONSET: When did these symptoms begin? (e.g., hours, days, weeks, months)     yesterday 4. CAUSE: What do you think is causing the weakness or fatigue? (e.g., not drinking enough fluids, medical problem, trouble sleeping)     Anemia  Protocols used: Weakness (Generalized) and Fatigue-A-AH

## 2024-01-25 NOTE — Telephone Encounter (Signed)
 Received a call from patients husband Angela Gibbs stating that patient is very weak and would like to get labs to see how her Hgb is.  Per Angela Pepper NP this is fine.  Lab appt made for today.  Hgb 8.2 down from 8.7.  Per Angela Pepper NP, please transfuse 2 units of PRBCs.  Appt made for Wednesday for blood transfusion.  Advised to take 2 Tylenol  and 1 Benadryl prior to coming to expedite blood transfusion.  Patients husband understands instructions and acknowledges information

## 2024-01-25 NOTE — Telephone Encounter (Signed)
 FYI. Pt refusing to go to ED.

## 2024-01-25 NOTE — Telephone Encounter (Signed)
 I called and spoke with her husband- they have plan to get a blood transfusion this week and also to get iron   Hematology is managing this acute issue for her.  Offered reassurance- she feels good about their plan

## 2024-01-27 ENCOUNTER — Inpatient Hospital Stay

## 2024-01-27 VITALS — BP 137/58 | HR 65 | Temp 97.5°F | Resp 18

## 2024-01-27 DIAGNOSIS — D500 Iron deficiency anemia secondary to blood loss (chronic): Secondary | ICD-10-CM

## 2024-01-27 DIAGNOSIS — D509 Iron deficiency anemia, unspecified: Secondary | ICD-10-CM | POA: Diagnosis not present

## 2024-01-27 DIAGNOSIS — D649 Anemia, unspecified: Secondary | ICD-10-CM

## 2024-01-27 MED ORDER — FUROSEMIDE 10 MG/ML IJ SOLN: 20.0000 mg | Freq: Once | INTRAMUSCULAR | Status: DC

## 2024-01-27 MED ORDER — DIPHENHYDRAMINE HCL 25 MG PO CAPS: 25.0000 mg | ORAL_CAPSULE | Freq: Once | ORAL | Status: DC

## 2024-01-27 MED ORDER — ACETAMINOPHEN 325 MG PO TABS: 650.0000 mg | ORAL_TABLET | Freq: Once | ORAL | Status: DC

## 2024-01-27 MED ORDER — SODIUM CHLORIDE 0.9 % IV SOLN
510.0000 mg | Freq: Once | INTRAVENOUS | Status: AC
  Administered 2024-01-27: 510 mg via INTRAVENOUS
  Filled 2024-01-27 (×2): qty 17

## 2024-01-27 MED ORDER — SODIUM CHLORIDE 0.9% IV SOLUTION
250.0000 mL | INTRAVENOUS | Status: DC
  Administered 2024-01-27: 250 mL via INTRAVENOUS

## 2024-01-27 NOTE — Patient Instructions (Signed)
 Ferumoxytol  Injection What is this medication? FERUMOXYTOL  (FER ue MOX i tol) treats low levels of iron  in your body (iron  deficiency anemia). Iron  is a mineral that plays an important role in making red blood cells, which carry oxygen from your lungs to the rest of your body. This medicine may be used for other purposes; ask your health care provider or pharmacist if you have questions. COMMON BRAND NAME(S): Feraheme  What should I tell my care team before I take this medication? They need to know if you have any of these conditions: Anemia not caused by low iron  levels High levels of iron  in the blood Magnetic resonance imaging (MRI) test scheduled An unusual or allergic reaction to iron , other medications, foods, dyes, or preservatives Pregnant or trying to get pregnant Breastfeeding How should I use this medication? This medication is injected into a vein. It is given by your care team in a hospital or clinic setting. Talk to your care team the use of this medication in children. Special care may be needed. Overdosage: If you think you have taken too much of this medicine contact a poison control center or emergency room at once. NOTE: This medicine is only for you. Do not share this medicine with others. What if I miss a dose? It is important not to miss your dose. Call your care team if you are unable to keep an appointment. What may interact with this medication? Other iron  products This list may not describe all possible interactions. Give your health care provider a list of all the medicines, herbs, non-prescription drugs, or dietary supplements you use. Also tell them if you smoke, drink alcohol, or use illegal drugs. Some items may interact with your medicine. What should I watch for while using this medication? Visit your care team for regular checks on your progress. Tell your care team if your symptoms do not start to get better or if they get worse. You may need blood work done  while you are taking this medication. You may need to eat more foods that contain iron . Talk to your care team. Foods that contain iron  include whole grains or cereals, dried fruits, beans, peas, leafy green vegetables, and organ meats (liver, kidney). What side effects may I notice from receiving this medication? Side effects that you should report to your care team as soon as possible: Allergic reactions--skin rash, itching, hives, swelling of the face, lips, tongue, or throat Low blood pressure--dizziness, feeling faint or lightheaded, blurry vision Shortness of breath Side effects that usually do not require medical attention (report to your care team if they continue or are bothersome): Flushing Headache Joint pain Muscle pain Nausea Pain, redness, or irritation at injection site This list may not describe all possible side effects. Call your doctor for medical advice about side effects. You may report side effects to FDA at 1-800-FDA-1088. Where should I keep my medication? This medication is given in a hospital or clinic. It will not be stored at home. NOTE: This sheet is a summary. It may not cover all possible information. If you have questions about this medicine, talk to your doctor, pharmacist, or health care provider.  2024 Elsevier/Gold Standard (2022-09-24 00:00:00)Blood Transfusion, Adult, Care After After a blood transfusion, it is common to have: Bruising and soreness at the IV site. A headache. Follow these instructions at home: Your doctor may give you more instructions. If you have problems, contact your doctor. Insertion site care     Follow instructions from your  doctor about how to take care of your insertion site. This is where an IV tube was put into your vein. Make sure you: Wash your hands with soap and water for at least 20 seconds before and after you change your bandage. If you cannot use soap and water, use hand sanitizer. Change your bandage as told by  your doctor. Check your insertion site every day for signs of infection. Check for: Redness, swelling, or pain. Bleeding from the site. Warmth. Pus or a bad smell. General instructions Take over-the-counter and prescription medicines only as told by your doctor. Rest as told by your doctor. Go back to your normal activities as told by your doctor. Keep all follow-up visits. You may need to have tests at certain times to check your blood. Contact a doctor if: You have itching or red, swollen areas of skin (hives). You have a fever or chills. You have pain in the head, back, or chest. You feel worried or nervous (anxious). You feel weak after doing your normal activities. You have any of these problems at the insertion site: Redness, swelling, warmth, or pain. Bleeding that does not stop with pressure. Pus or a bad smell. If you received your blood transfusion in an outpatient setting, you will be told whom to contact to report any reactions. Get help right away if: You have signs of a serious reaction. This may be coming from an allergy or the body's defense system (immune system). Signs include: Trouble breathing or shortness of breath. Swelling of the face or feeling warm (flushed). A widespread rash. Dark pee (urine) or blood in the pee. Fast heartbeat. These symptoms may be an emergency. Get help right away. Call 911. Do not wait to see if the symptoms will go away. Do not drive yourself to the hospital. Summary Bruising and soreness at the IV site are common. Check your insertion site every day for signs of infection. Rest as told by your doctor. Go back to your normal activities as told by your doctor. Get help right away if you have signs of a serious reaction. This information is not intended to replace advice given to you by your health care provider. Make sure you discuss any questions you have with your health care provider. Document Revised: 05/03/2021 Document  Reviewed: 05/03/2021 Elsevier Patient Education  2024 [employer].

## 2024-01-28 ENCOUNTER — Inpatient Hospital Stay

## 2024-01-28 ENCOUNTER — Ambulatory Visit
Admission: RE | Admit: 2024-01-28 | Discharge: 2024-01-28 | Disposition: A | Discharge status: Home / Self Care | Source: Ambulatory Visit | Attending: Radiology | Admitting: Radiology

## 2024-01-28 VITALS — BP 171/82 | HR 53 | Temp 97.5°F | Resp 18 | Ht 63.0 in | Wt 185.1 lb

## 2024-01-28 DIAGNOSIS — C50311 Malignant neoplasm of lower-inner quadrant of right female breast: Secondary | ICD-10-CM

## 2024-01-28 LAB — TYPE AND SCREEN
ABO/RH(D): O POS
Antibody Screen: NEGATIVE
Unit division: 0

## 2024-01-28 LAB — BPAM RBC
Blood Product Expiration Date: 202512282359
ISSUE DATE / TIME: 202512100748
Unit Type and Rh: 202512282359
Unit Type and Rh: 5100

## 2024-01-28 MED ORDER — TRIAMCINOLONE ACETONIDE 0.5 % EX OINT: 1.0000 | TOPICAL_OINTMENT | Freq: Two times a day (BID) | CUTANEOUS | 0 refills | Status: AC

## 2024-01-28 NOTE — Progress Notes (Signed)
 Radiation Oncology         [phone] ________________________________  Name: Angela Gibbs MRN: [id number]  Date: 01/28/2024  DOB: Apr 21, 1940  Follow-Up Visit Note  Outpatient  CC: Copland, [name], MD  Copland, [name], MD  Diagnosis and Prior Radiotherapy:    ICD-10-CM   1. Malignant neoplasm of lower-inner quadrant of right breast of female, estrogen receptor positive (HCC)  C50.311    Z17.0       Cancer Staging  Carcinoma of lower-inner quadrant of right breast in female, estrogen receptor positive (HCC) Staging form: Breast, AJCC 8th Edition - Clinical stage from 09/07/2023: Stage IA (cT1b, cN0, cM0, G2, ER+, PR+, HER2-) - Signed by Izell Domino, MD on 09/07/2023 Stage prefix: Initial diagnosis Histologic grading system: 3 grade system - Pathologic stage from 09/24/2023: Stage IA (pT1b, pN0, cM0, G2, ER+, PR+, HER2-) - Signed by Lanny Callander, MD on 11/22/2023 Histologic grading system: 3 grade system Residual tumor (R): R0  ==========DELIVERED PLANS==========  First Treatment Date: 2023-10-26 Last Treatment Date: 2023-11-30   Plan Name: Breast_R Site: Breast, Right Technique: 3D Mode: Photon Dose Per Fraction: 5.7 Gy Prescribed Dose (Delivered / Prescribed): 28.5 Gy / 28.5 Gy Prescribed Fxs (Delivered / Prescribed): 5 / 5  Stage IA (cT1b, cN0, cM0) Right Breast LIQ, Invasive and in situ ductal carcinoma, ER+ / PR+ / Her2- (by IHC), Grade 2: s/p right breast lumpectomy without nodal excisions and adjuvant radiation completed on 11/30/2023  CHIEF COMPLAINT: Here for follow-up for breast cancer  Narrative:  The patient returns today for routine follow-up. She completed her treatment approximately 2 months ago.   The patient presents with a rash on the left breast that started about two weeks ago, following radiation therapy completed two months prior.   ALLERGIES:  is allergic to cephalosporins; ciprofloxacin; influenza virus vaccine; keflex  [cephalexin ];  latex; levaquin [levofloxacin]; nitrofuran derivatives; penicillins; sulfacetamide sodium; tape; xopenex  [levalbuterol  hcl]; zostavax [zoster vaccine live]; erythromycin; hydrochlorothiazide ; lisinopril; petrolatum; pregabalin; ropinirole  hcl; shellfish allergy; sulfa antibiotics; golimumab ; ibuprofen; lactose; aspirin; avelox [moxifloxacin hcl in nacl]; cefdinir; chromium; egg protein-containing drug products; egg solids, whole; iodinated contrast media; lactose intolerance (gi); percodan [oxycodone -aspirin]; prednisone ; and troleandomycin.  Meds: Current Outpatient Medications  Medication Sig Dispense Refill   ALPRAZolam  (XANAX ) 0.25 MG tablet TAKE 1 TABLET(0.25 MG) BY MOUTH TWICE DAILY AS NEEDED FOR ANXIETY 30 tablet 1   Ascorbic Acid (VITAMIN C) 1000 MG tablet Take 1,000 mg by mouth daily.     atorvastatin  (LIPITOR) 10 MG tablet Take 1 tablet (10 mg total) by mouth daily. 90 tablet 3   butalbital-acetaminophen -caffeine (FIORICET) 50-325-40 MG tablet Take 1-2 tablets by mouth 2 (two) times daily as needed for headache (or leg pain).     Cholecalciferol (VITAMIN D3) 125 MCG (5000 UT) CAPS Take 5,000 Units by mouth daily.     cyclobenzaprine (FLEXERIL) 5 MG tablet Take 5 mg by mouth at bedtime.     FLUoxetine  (PROZAC ) 20 MG capsule Take 1 capsule (20 mg total) by mouth daily. 90 capsule 1   ibuprofen (ADVIL) 200 MG tablet Take 200 mg by mouth every 6 (six) hours as needed.     lidocaine  (LIDODERM ) 5 % 2 patches daily.     montelukast  (SINGULAIR ) 10 MG tablet TAKE 1 TABLET(10 MG) BY MOUTH AT BEDTIME 30 tablet 2   nebivolol  (BYSTOLIC ) 10 MG tablet Take 1 tablet (10 mg total) by mouth daily. 90 tablet 1   NP THYROID  15 MG tablet Take 45 mg by mouth  every morning.     Prednicarbate 0.1 % CREA Apply 1 Application topically as needed for itching (affected sites).     REFRESH TEARS PF 0.5-0.9 % SOLN Place 1 drop into both eyes 3 (three) times daily as needed (for irritation).     sodium chloride  (V-R  NASAL SPRAY SALINE) 0.65 % nasal spray Place 1 spray into the nose as needed. Use in both nostrils twice daily 30 mL 6   spironolactone  (ALDACTONE ) 25 MG tablet TAKE 1/2 TABLET(12.5 MG) BY MOUTH DAILY 30 tablet 3   traMADol  (ULTRAM ) 50 MG tablet Take 1 tablet (50 mg total) by mouth every 6 (six) hours as needed for moderate pain (pain score 4-6) or severe pain (pain score 7-10). 20 tablet 0   valsartan  (DIOVAN ) 320 MG tablet TAKE 1 TABLET(320 MG) BY MOUTH DAILY 90 tablet 3   No current facility-administered medications for this encounter.    Physical Findings: The patient is in no acute distress. Patient is alert and oriented.  height is 5' 3 (1.6 m) and weight is 185 lb 2 oz (84 kg). Her temporal temperature is 97.5 F (36.4 C) (abnormal). Her blood pressure is 171/82 (abnormal) and her pulse is 53 (abnormal). Her respiration is 18 and oxygen saturation is 100%. .    ***   Lab Findings: Lab Results  Component Value Date   WBC 5.5 01/25/2024   HGB 8.5 (L) 01/25/2024   HCT 27.1 (L) 01/25/2024   MCV 87.4 01/25/2024   PLT 238 01/25/2024    Radiographic Findings: No results found.  Impression/Plan: Healing well from radiotherapy to the breast tissue.    ***     On date of service, in total, I spent *** minutes on this encounter. Patient was seen in person.  _____________________________________    Leeroy Due, PA-C

## 2024-01-28 NOTE — Progress Notes (Signed)
°  Angela Gibbs is here today for follow up post radiation to the breast.   Breast Side:right   They completed their radiation on: 11/30/2023  Does the patient complain of any of the following: Post radiation skin issues: Having redness, itching and peeling. Skin is itching a lot lasts for 3-4 hours, using over the counter pain medication Having a rash  Breast Tenderness: None Breast Swelling: None Lymphadema: None Range of Motion limitations: None Fatigue post radiation: Yes Appetite good/fair/poor: Good  Additional comments if applicable:

## 2024-02-01 ENCOUNTER — Ambulatory Visit: Admitting: *Deleted

## 2024-02-01 VITALS — Ht 63.0 in | Wt 180.0 lb

## 2024-02-01 DIAGNOSIS — Z0000 Encounter for general adult medical examination without abnormal findings: Secondary | ICD-10-CM

## 2024-02-01 NOTE — Progress Notes (Addendum)
 Chief Complaint  Patient presents with   Medicare Wellness     Subjective:   Angela Gibbs is a 83 y.o. female who presents for a Medicare Annual Wellness Visit.  Visit info / Clinical Intake: Medicare Wellness Visit Type:: Subsequent Annual Wellness Visit Persons participating in visit and providing information:: patient Medicare Wellness Visit Mode:: Telephone If telephone:: video declined Since this visit was completed virtually, some vitals may be partially provided or unavailable. Missing vitals are due to the limitations of the virtual format.: Unable to obtain vitals - no equipment If Telephone or Video please confirm:: I connected with patient using audio/video enable telemedicine. I verified patient identity with two identifiers, discussed telehealth limitations, and patient agreed to proceed. Patient Location:: home Provider Location:: office Interpreter Needed?: No Pre-visit prep was completed: yes AWV questionnaire completed by patient prior to visit?: no Living arrangements:: lives with spouse/significant other Patient's Overall Health Status Rating: (!) fair (some days gets up and feels so bad that she can't eat. Covers how she is feeling with her friendly / positive outlook) Typical amount of pain: none Does pain affect daily life?: no Are you currently prescribed opioids?: no  Dietary Habits and Nutritional Risks How many meals a day?: 2 Eats fruit and vegetables daily?: yes Most meals are obtained by: preparing own meals In the last 2 weeks, have you had any of the following?: none Diabetic:: no  Functional Status Activities of Daily Living (to include ambulation/medication): Independent (Has upper body weakness and undergoing PT for 6 months. Spouse cooks and assists) Ambulation: Independent with device- listed below Home Assistive Devices/Equipment: Crutches (uses crutches for the first hr of the day until body can move) Medication Administration:  Independent Home Management (perform basic housework or laundry): Needs assistance (comment) (has a maid) Manage your own finances?: yes Primary transportation is: driving Concerns about vision?: no *vision screening is required for [facility]* (up to date with DR RAdionchenko) Concerns about hearing?: (!) yes (auditory nerve was cut during surgery years ago) Uses hearing aids?: no  Fall Screening Falls in the past year?: 0 Number of falls in past year: 0 Was there an injury with Fall?: 0 Fall Risk Category Calculator: 0 Patient Fall Risk Level: Low Fall Risk  Fall Risk Patient at Risk for Falls Due to: Orthopedic patient Fall risk Follow up: Falls evaluation completed  Home and Transportation Safety: All rugs have non-skid backing?: yes All stairs or steps have railings?: (!) no Grab bars in the bathtub or shower?: (!) no (uses shower chair) Have non-skid surface in bathtub or shower?: yes Good home lighting?: yes Regular seat belt use?: yes Hospital stays in the last year:: (!) yes How many hospital stays:: 1 Reason: COVID and pneumonia  Cognitive Assessment Difficulty concentrating, remembering, or making decisions? : no Will 6CIT or Mini Cog be Completed: yes What year is it?: 0 points What month is it?: 0 points Give patient an address phrase to remember (5 components): [address] [place]  About what time is it?: 0 points Count backwards from 20 to 1: 0 points Say the months of the year in reverse: 2 points Repeat the address phrase from earlier: 2 points 6 CIT Score: 4 points  Advance Directives (For Healthcare) Does Patient Have a Medical Advance Directive?: Yes Does patient want to make changes to medical advance directive?: No - Patient declined Type of Advance Directive: Living will; Healthcare Power of Attorney Copy of Healthcare Power of Attorney in Chart?: No - copy  requested Copy of Living Will in Chart?: No - copy requested Would patient like  information on creating a medical advance directive?: No - Patient declined  Reviewed/Updated  Reviewed/Updated: Reviewed All (Medical, Surgical, Family, Medications, Allergies, Care Teams, Patient Goals)    Allergies (verified) Cephalosporins; Ciprofloxacin; Influenza virus vaccine; Keflex  [cephalexin ]; Latex; Levaquin [levofloxacin]; Nitrofuran derivatives; Penicillins; Sulfacetamide sodium; Tape; Xopenex  [levalbuterol  hcl]; Zostavax [zoster vaccine live]; Erythromycin; Hydrochlorothiazide ; Lisinopril; Petrolatum; Pregabalin; Ropinirole  hcl; Shellfish allergy; Sulfa antibiotics; Golimumab ; Ibuprofen; Lactose; Aspirin; Avelox [moxifloxacin hcl in nacl]; Cefdinir; Chromium; Egg protein-containing drug products; Egg solids, whole; Iodinated contrast media; Lactose intolerance (gi); Percodan [oxycodone -aspirin]; Prednisone ; and Troleandomycin   Current Medications (verified) Outpatient Encounter Medications as of 02/01/2024  Medication Sig   ALPRAZolam  (XANAX ) 0.25 MG tablet TAKE 1 TABLET(0.25 MG) BY MOUTH TWICE DAILY AS NEEDED FOR ANXIETY   Ascorbic Acid (VITAMIN C) 1000 MG tablet Take 1,000 mg by mouth daily.   atorvastatin  (LIPITOR) 10 MG tablet Take 1 tablet (10 mg total) by mouth daily.   butalbital-acetaminophen -caffeine (FIORICET) 50-325-40 MG tablet Take 1-2 tablets by mouth 2 (two) times daily as needed for headache (or leg pain).   Cholecalciferol (VITAMIN D3) 125 MCG (5000 UT) CAPS Take 5,000 Units by mouth daily.   cyclobenzaprine (FLEXERIL) 5 MG tablet Take 5 mg by mouth at bedtime.   FLUoxetine  (PROZAC ) 20 MG capsule Take 1 capsule (20 mg total) by mouth daily.   ibuprofen (ADVIL) 200 MG tablet Take 200 mg by mouth every 6 (six) hours as needed.   lidocaine  (LIDODERM ) 5 % 2 patches daily.   montelukast  (SINGULAIR ) 10 MG tablet TAKE 1 TABLET(10 MG) BY MOUTH AT BEDTIME   nebivolol  (BYSTOLIC ) 10 MG tablet Take 1 tablet (10 mg total) by mouth daily.   NP THYROID  15 MG tablet Take 45  mg by mouth every morning.   Prednicarbate 0.1 % CREA Apply 1 Application topically as needed for itching (affected sites).   REFRESH TEARS PF 0.5-0.9 % SOLN Place 1 drop into both eyes 3 (three) times daily as needed (for irritation).   sodium chloride  (V-R NASAL SPRAY SALINE) 0.65 % nasal spray Place 1 spray into the nose as needed. Use in both nostrils twice daily   spironolactone  (ALDACTONE ) 25 MG tablet TAKE 1/2 TABLET(12.5 MG) BY MOUTH DAILY   traMADol  (ULTRAM ) 50 MG tablet Take 1 tablet (50 mg total) by mouth every 6 (six) hours as needed for moderate pain (pain score 4-6) or severe pain (pain score 7-10).   triamcinolone  ointment (KENALOG ) 0.5 % Apply 1 Application topically 2 (two) times daily.   valsartan  (DIOVAN ) 320 MG tablet TAKE 1 TABLET(320 MG) BY MOUTH DAILY   No facility-administered encounter medications on file as of 02/01/2024.    History: Past Medical History:  Diagnosis Date   Abdominal pain, acute, right upper quadrant 07/29/2013   Allergy    Aortic atherosclerosis 01/21/2023   Body mass index (BMI) 31.0-31.9, adult 12/13/2021   Cataracts, bilateral 06/06/2013   Chalazion of right upper eyelid 06/06/2014   Concussion    Current use of beta blocker 04/11/2016   DOE (dyspnea on exertion) 12/25/2022   Dyspnea on exertion 12/25/2021   Eczema 12/24/2022   Essential hypertension 02/20/2016   Eyelid lesion 06/06/2013   Falls    Fatigue 12/24/2022   Gastroesophageal reflux disease without esophagitis 04/11/2016   Head injury    Herpes    IDA (iron  deficiency anemia) 01/11/2020   Inflammatory polyarthropathy (HCC) 12/24/2022   Iron  deficiency anemia due to  chronic blood loss 12/28/2019   Joint pain 12/24/2022   Lymphedema 12/24/2022   Migraines    Moderate persistent asthma without complication 05/13/2016   Nerve damage    Nuclear sclerosis of both eyes 06/06/2014   Obesity (BMI 30.0-34.9) 12/25/2021   Osteoarthritis 12/07/2019   Osteopenia 08/12/2022    Other allergic rhinitis 04/11/2016   Positive FIT (fecal immunochemical test) 12/28/2019   Psoriasis 12/07/2019   Psoriatic arthritis (HCC) 12/07/2019   Managed by rheumatology   PVD (posterior vitreous detachment), both eyes 03/19/2014   Restrictive lung disease 05/13/2016   Related to scoliosis and hiatal hernia   Retinal tear of right eye 02/13/2014   Shingles    Sjogren syndrome, unspecified 12/24/2022   TIA (transient ischemic attack) 05/18/2019   Past Surgical History:  Procedure Laterality Date   BIOPSY  03/12/2023   Procedure: BIOPSY;  Surgeon: Elicia Claw, MD;  Location: [facility];  Service: Gastroenterology;;   BRAIN SURGERY     for trigemina neuralgia   BREAST BIOPSY Right 08/18/2023   US  RT BREAST BX W LOC DEV 1ST LESION IMG BX SPEC US  GUIDE 08/18/2023 [facility]   BREAST BIOPSY  09/22/2023   MM RT RADIOACTIVE SEED LOC MAMMO GUIDE 09/22/2023 [facility]   BREAST LUMPECTOMY WITH RADIOACTIVE SEED LOCALIZATION Right 09/24/2023   Procedure: BREAST LUMPECTOMY WITH RADIOACTIVE SEED LOCALIZATION;  Surgeon: Vernetta Berg, MD;  Location: [facility];  Service: General;  Laterality: Right;  LMA RADIOACTIVE SEED GUIDED RIGHT BREAST LUMPECTOMY   CRANIOTOMY FOR EXTRACRANIAL-INTRACRANIAL BYPASS     ESOPHAGOGASTRODUODENOSCOPY (EGD) WITH PROPOFOL  N/A 03/12/2023   Procedure: ESOPHAGOGASTRODUODENOSCOPY (EGD) WITH PROPOFOL ;  Surgeon: Elicia Claw, MD;  Location: [facility];  Service: Gastroenterology;  Laterality: N/A;   EYE SURGERY  05/22/2015   milia excision   HIP SURGERY     intracranial surgery   NERVE SURGERY     ROOT CANAL     TONSILLECTOMY     Family History  Problem Relation Age of Onset   Heart disease Father    Heart attack Father    Social History   Occupational History   Not on file  Tobacco Use   Smoking status: Never   Smokeless tobacco: Never  Vaping Use   Vaping status: Never Used  Substance and Sexual Activity    Alcohol use: Not Currently    Comment: social   Drug use: No   Sexual activity: Yes   Tobacco Counseling Counseling given: Not Answered  SDOH Screenings   Food Insecurity: No Food Insecurity (02/01/2024)  Housing: Low Risk (02/01/2024)  Transportation Needs: No Transportation Needs (02/01/2024)  Utilities: Not At Risk (02/01/2024)  Alcohol Screen: Low Risk (12/11/2022)  Depression (PHQ2-9): Low Risk (02/01/2024)  Financial Resource Strain: Low Risk (12/11/2022)  Physical Activity: Inactive (02/01/2024)  Social Connections: Socially Integrated (02/01/2024)  Stress: No Stress Concern Present (02/01/2024)  Tobacco Use: Low Risk (02/01/2024)  Health Literacy: Adequate Health Literacy (12/11/2022)   See flowsheets for full screening details  Depression Screen PHQ 2 & 9 Depression Scale- Over the past 2 weeks, how often have you been bothered by any of the following problems? Little interest or pleasure in doing things: 0 Feeling down, depressed, or hopeless (PHQ Adolescent also includes...irritable): 0 PHQ-2 Total Score: 0 Trouble falling or staying asleep, or sleeping too much: 1 Feeling tired or having little energy: 1 Poor appetite or overeating (PHQ Adolescent also includes...weight loss): 0 Feeling bad about yourself - or that you are a failure or have  let yourself or your family down: 0 Trouble concentrating on things, such as reading the newspaper or watching television (PHQ Adolescent also includes...like school work): 0 Moving or speaking so slowly that other people could have noticed. Or the opposite - being so fidgety or restless that you have been moving around a lot more than usual: 0 Thoughts that you would be better off dead, or of hurting yourself in some way: 0 PHQ-9 Total Score: 2 If you checked off any problems, how difficult have these problems made it for you to do your work, take care of things at home, or get along with other people?: Not difficult at all      Goals Addressed   None          Objective:    Today's Vitals   02/01/24 1304  SpO2: 98%  Weight: 180 lb (81.6 kg)  Height: 5' 3 (1.6 m)   Body mass index is 31.89 kg/m.  Hearing/Vision screen No results found. Immunizations and Health Maintenance Health Maintenance  Topic Date Due   COVID-19 Vaccine (3 - Pfizer risk series) 04/27/2019   Mammogram  08/03/2024   Medicare Annual Wellness (AWV)  01/31/2025   DTaP/Tdap/Td (3 - Td or Tdap) 06/12/2026   Pneumococcal Vaccine: 50+ Years  Completed   Bone Density Scan  Completed   Meningococcal B Vaccine  Aged Out   Hepatitis C Screening  Discontinued   Zoster Vaccines- Shingrix  Discontinued        Assessment/Plan:  This is a routine wellness examination for Angela Gibbs.  Patient Care Team: Copland, [name], MD as PCP - General (Family Medicine) Genette Charlie GAILS, MD as Consulting Physician (Obstetrics and Gynecology) Trinda Lamar Dover, OD as Consulting Physician (Ophthalmology) Cleotilde Fairy POUR, MD as Consulting Physician (Neurology) Quenten Conger, NP as Consulting Physician (Pain Medicine) Charmaine Glance, MD as Consulting Physician (Gastroenterology) Orlie Norris, MD as Consulting Physician (Internal Medicine) Patti Charlie CROME., MD as Physician Assistant (Pain Medicine)  I have personally reviewed and noted the following in the patients chart:   Medical and social history Use of alcohol, tobacco or illicit drugs  Current medications and supplements including opioid prescriptions. Functional ability and status Nutritional status Physical activity Advanced directives List of other physicians Hospitalizations, surgeries, and ER visits in previous 12 months Vitals Screenings to include cognitive, depression, and falls Referrals and appointments  No orders of the defined types were placed in this encounter.  In addition, I have reviewed and discussed with patient certain preventive  protocols, quality metrics, and best practice recommendations. A written personalized care plan for preventive services as well as general preventive health recommendations were provided to patient.   Lolita Libra, CMA   02/01/2024   Return in 1 year (on 01/31/2025).  After Visit Summary: (Mail) Due to this being a telephonic visit, the after visit summary with patients personalized plan was offered to patient via mail   Nurse Notes: nothing significant to report

## 2024-02-01 NOTE — Patient Instructions (Addendum)
 Ms. Mangels,  Thank you for taking the time for your Medicare Wellness Visit. I appreciate your continued commitment to your health goals. Please review the care plan we discussed, and feel free to reach out if I can assist you further.  Please note that Annual Wellness Visits do not include a physical exam. Some assessments may be limited, especially if the visit was conducted virtually. If needed, we may recommend an in-person follow-up with your provider.  Ongoing Care Seeing your primary care provider every 3 to 6 months helps us  monitor your health and provide consistent, personalized care.   Dr Watt:  05/19/24 3:20pm Medicare AWV: 02/02/25 3pm   Recommended Screenings:  Health Maintenance  Topic Date Due   Zoster (Shingles) Vaccine (1 of 2) Never done   COVID-19 Vaccine (3 - Pfizer risk series) 04/27/2019   Medicare Annual Wellness Visit  12/11/2023   Breast Cancer Screening  08/03/2024   DTaP/Tdap/Td vaccine (3 - Td or Tdap) 06/12/2026   Pneumococcal Vaccine for age over 50  Completed   Osteoporosis screening with Bone Density Scan  Completed   Meningitis B Vaccine  Aged Out   Hepatitis C Screening  Discontinued       02/01/2024    1:13 PM  Advanced Directives  Does Patient Have a Medical Advance Directive? No  Would patient like information on creating a medical advance directive? No - Patient declined   Please bring a copy of your health care power of attorney and living will to the office to be added to your chart at your convenience. You can mail a copy to [facility] [address] [place], [place] [address] or email to [email or web address]  Vision: Annual vision screenings are recommended for early detection of glaucoma, cataracts, and diabetic retinopathy. These exams can also reveal signs of chronic conditions such as diabetes and high blood pressure.  Dental: Annual dental screenings help detect early signs of oral cancer, gum disease, and  other conditions linked to overall health, including heart disease and diabetes.  Please see the attached documents for additional preventive care recommendations.

## 2024-02-02 DIAGNOSIS — L304 Erythema intertrigo: Secondary | ICD-10-CM | POA: Diagnosis not present

## 2024-02-02 DIAGNOSIS — L280 Lichen simplex chronicus: Secondary | ICD-10-CM | POA: Diagnosis not present

## 2024-02-05 ENCOUNTER — Inpatient Hospital Stay

## 2024-02-05 ENCOUNTER — Telehealth: Payer: Self-pay | Admitting: *Deleted

## 2024-02-05 ENCOUNTER — Inpatient Hospital Stay: Admitting: Family

## 2024-02-05 NOTE — Telephone Encounter (Signed)
 Patient's husband called after hours and left message to cancel appointment for today. Returned phone call and left voicemail to confirm cancellation of appointment. Told to call back to reschedule appointment.

## 2024-02-09 ENCOUNTER — Inpatient Hospital Stay (HOSPITAL_BASED_OUTPATIENT_CLINIC_OR_DEPARTMENT_OTHER): Admitting: Family

## 2024-02-09 ENCOUNTER — Inpatient Hospital Stay

## 2024-02-09 ENCOUNTER — Encounter: Payer: Self-pay | Admitting: Family

## 2024-02-09 ENCOUNTER — Other Ambulatory Visit: Payer: Self-pay

## 2024-02-09 VITALS — BP 141/61 | HR 66 | Resp 19 | Ht 63.5 in | Wt 182.0 lb

## 2024-02-09 DIAGNOSIS — D649 Anemia, unspecified: Secondary | ICD-10-CM | POA: Diagnosis not present

## 2024-02-09 DIAGNOSIS — R790 Abnormal level of blood mineral: Secondary | ICD-10-CM | POA: Diagnosis not present

## 2024-02-09 DIAGNOSIS — D500 Iron deficiency anemia secondary to blood loss (chronic): Secondary | ICD-10-CM

## 2024-02-09 DIAGNOSIS — D509 Iron deficiency anemia, unspecified: Secondary | ICD-10-CM | POA: Diagnosis not present

## 2024-02-09 LAB — CBC WITH DIFFERENTIAL (CANCER CENTER ONLY)
Abs Immature Granulocytes: 0.03 K/uL (ref 0.00–0.07)
Basophils Absolute: 0.1 K/uL (ref 0.0–0.1)
Basophils Relative: 1 %
Eosinophils Absolute: 0.2 K/uL (ref 0.0–0.5)
Eosinophils Relative: 4 %
HCT: 30.6 % — ABNORMAL LOW (ref 36.0–46.0)
Hemoglobin: 9.9 g/dL — ABNORMAL LOW (ref 12.0–15.0)
Immature Granulocytes: 1 %
Lymphocytes Relative: 16 %
Lymphs Abs: 0.9 K/uL (ref 0.7–4.0)
MCH: 29.8 pg (ref 26.0–34.0)
MCHC: 32.4 g/dL (ref 30.0–36.0)
MCV: 92.2 fL (ref 80.0–100.0)
Monocytes Absolute: 0.6 K/uL (ref 0.1–1.0)
Monocytes Relative: 11 %
Neutro Abs: 3.8 K/uL (ref 1.7–7.7)
Neutrophils Relative %: 67 %
Platelet Count: 265 K/uL (ref 150–400)
RBC: 3.32 MIL/uL — ABNORMAL LOW (ref 3.87–5.11)
RDW: 22.1 % — ABNORMAL HIGH (ref 11.5–15.5)
WBC Count: 5.6 K/uL (ref 4.0–10.5)
nRBC: 0 % (ref 0.0–0.2)

## 2024-02-09 LAB — IRON AND IRON BINDING CAPACITY (CC-WL,HP ONLY)
Iron: 67 ug/dL (ref 28–170)
Saturation Ratios: 19 % (ref 10.4–31.8)
TIBC: 347 ug/dL (ref 250–450)
UIBC: 280 ug/dL

## 2024-02-09 LAB — SAMPLE TO BLOOD BANK

## 2024-02-09 LAB — RETICULOCYTES
Immature Retic Fract: 12.1 % (ref 2.3–15.9)
RBC.: 3.29 MIL/uL — ABNORMAL LOW (ref 3.87–5.11)
Retic Count, Absolute: 115.5 K/uL (ref 19.0–186.0)
Retic Ct Pct: 3.5 % — ABNORMAL HIGH (ref 0.4–3.1)

## 2024-02-09 NOTE — Progress Notes (Signed)
 " Hematology and Oncology Follow Up Visit  Angela Gibbs [id number] 06/23/1940 83 y.o. 02/09/2024   Principle Diagnosis:  Iron  deficiency anemia MDS panel was negative   Current Therapy:        IV iron  as indicated    Interim History:  Angela Gibbs is here today for follow-up. Angela Gibbs is feeling fatigued today. Hgb thankfully is up at 9.9 post transfusion.  Iron  studies are pending.  Angela Gibbs has not noted any obvious blood loss. No fever, chills, n/v, cough, rash, dizziness, SOB, chest pain, palpitations, abdominal pan or changes in bowel or bladder habits.  Angela Gibbs is having pain in the right leg with bursitis and was not able to go to PT today.  No falls or syncope reported.  Appetite and hydration are good. Weight is stable at 182 lbs.   ECOG Performance Status: 1 - Symptomatic but completely ambulatory  Medications:  Allergies as of 02/09/2024       Reactions   Cephalosporins Shortness Of Breath, Other (See Comments)   Breathing difficulty   Ciprofloxacin Hives, Shortness Of Breath   Influenza Virus Vaccine Other (See Comments)   Numbness in legs   Keflex  [cephalexin ] Hives   Latex Hives, Itching, Rash   Levaquin [levofloxacin] Hives, Palpitations, Other (See Comments)   Chest pain   Nitrofuran Derivatives Hives, Itching, Swelling   Penicillins Hives   Sulfacetamide Sodium Hives   Tape Itching, Other (See Comments)   RED AND IRRITATED SKIN   Xopenex  [levalbuterol  Hcl] Anxiety, Other (See Comments)   Tremors with violent body chattering and shaking   Zostavax [zoster Vaccine Live] Other (See Comments)   Pt has been told by specialists not to receive vaccine d/t h/o herpes in bloodstream and shingles.   Erythromycin Other (See Comments)   stomach issues   Hydrochlorothiazide  Other (See Comments)   Migraine   Lisinopril Cough   Petrolatum Nausea Only   Pregabalin Other (See Comments)   Ropinirole  Hcl Nausea And Vomiting   Shellfish Allergy Hives   Sulfa Antibiotics Hives    Golimumab  Other (See Comments)   Felt poorly (Simponi )   Ibuprofen Other (See Comments)   Stomach upset   Lactose Other (See Comments)   stomach issues Other Reaction(s): GI Intolerance   Aspirin Other (See Comments)   Stomach pain/burning   Avelox [moxifloxacin Hcl In Nacl] Nausea Only   Cefdinir Nausea And Vomiting   Chromium Other (See Comments)   Blisters   Egg Protein-containing Drug Products    Per allergy testing, no known reaction. Pt states Angela Gibbs was told Angela Gibbs could probably eat 2-3 eggs weekly w/o issue.   Egg Solids, Whole Other (See Comments)   Per allergy testing, no known reaction. Pt states Angela Gibbs was told Angela Gibbs could probably eat 2-3 eggs weekly w/o issue.   Iodinated Contrast Media Other (See Comments)   unknown   Lactose Intolerance (gi) Other (See Comments)   stomach issues   Percodan [oxycodone -aspirin] Nausea And Vomiting   Prednisone  Palpitations   No injections, can do the pills   Troleandomycin Other (See Comments)   Unknown reaction        Medication List        Accurate as of February 09, 2024  3:02 PM. If you have any questions, ask your nurse or doctor.          ALPRAZolam  0.25 MG tablet Commonly known as: XANAX  TAKE 1 TABLET(0.25 MG) BY MOUTH TWICE DAILY AS NEEDED FOR ANXIETY   atorvastatin  10 MG tablet  Commonly known as: LIPITOR Take 1 tablet (10 mg total) by mouth daily.   butalbital-acetaminophen -caffeine 50-325-40 MG tablet Commonly known as: FIORICET Take 1-2 tablets by mouth 2 (two) times daily as needed for headache (or leg pain).   cyclobenzaprine 5 MG tablet Commonly known as: FLEXERIL Take 5 mg by mouth at bedtime.   FLUoxetine  20 MG capsule Commonly known as: PROZAC  Take 1 capsule (20 mg total) by mouth daily.   ibuprofen 200 MG tablet Commonly known as: ADVIL Take 200 mg by mouth every 6 (six) hours as needed.   lidocaine  5 % Commonly known as: LIDODERM  2 patches daily.   montelukast  10 MG tablet Commonly  known as: SINGULAIR  TAKE 1 TABLET(10 MG) BY MOUTH AT BEDTIME   nebivolol  10 MG tablet Commonly known as: BYSTOLIC  Take 1 tablet (10 mg total) by mouth daily.   NP Thyroid  15 MG tablet Generic drug: thyroid  Take 45 mg by mouth every morning.   Prednicarbate 0.1 % Crea Apply 1 Application topically as needed for itching (affected sites).   Refresh Tears PF 0.5-0.9 % Soln Generic drug: Carboxymethylcell-Glycerin PF Place 1 drop into both eyes 3 (three) times daily as needed (for irritation).   sodium chloride  0.65 % nasal spray Commonly known as: V-R NASAL SPRAY SALINE Place 1 spray into the nose as needed. Use in both nostrils twice daily   spironolactone  25 MG tablet Commonly known as: ALDACTONE  TAKE 1/2 TABLET(12.5 MG) BY MOUTH DAILY   traMADol  50 MG tablet Commonly known as: ULTRAM  Take 1 tablet (50 mg total) by mouth every 6 (six) hours as needed for moderate pain (pain score 4-6) or severe pain (pain score 7-10).   triamcinolone  ointment 0.5 % Commonly known as: KENALOG  Apply 1 Application topically 2 (two) times daily.   valsartan  320 MG tablet Commonly known as: DIOVAN  TAKE 1 TABLET(320 MG) BY MOUTH DAILY   vitamin C 1000 MG tablet Take 1,000 mg by mouth daily.   Vitamin D3 125 MCG (5000 UT) Caps Take 5,000 Units by mouth daily.        Allergies: Allergies[1]  Past Medical History, Surgical history, Social history, and Family History were reviewed and updated.  Review of Systems: All other 10 point review of systems is negative.   Physical Exam:  height is 5' 3.5 (1.613 m) and weight is 182 lb (82.6 kg). Her blood pressure is 141/61 (abnormal) and her pulse is 66. Her respiration is 19 and oxygen saturation is 97%.   Wt Readings from Last 3 Encounters:  02/09/24 182 lb (82.6 kg)  02/01/24 180 lb (81.6 kg)  01/28/24 185 lb 2 oz (84 kg)    Ocular: Sclerae unicteric, pupils equal, round and reactive to light Ear-nose-throat: Oropharynx clear,  dentition fair Lymphatic: No cervical or supraclavicular adenopathy Lungs no rales or rhonchi, good excursion bilaterally Heart regular rate and rhythm, no murmur appreciated Abd soft, nontender, positive bowel sounds MSK no focal spinal tenderness, no joint edema Neuro: non-focal, well-oriented, appropriate affect Breasts: Deferred   Lab Results  Component Value Date   WBC 5.6 02/09/2024   HGB 9.9 (L) 02/09/2024   HCT 30.6 (L) 02/09/2024   MCV 92.2 02/09/2024   PLT 265 02/09/2024   Lab Results  Component Value Date   FERRITIN 43 01/05/2024   IRON  25 (L) 01/05/2024   TIBC 430 01/05/2024   UIBC 405 01/05/2024   IRONPCTSAT 6 (L) 01/05/2024   Lab Results  Component Value Date   RETICCTPCT 3.5 (H) 02/09/2024  RBC 3.32 (L) 02/09/2024   RBC 3.29 (L) 02/09/2024   No results found for: KPAFRELGTCHN, LAMBDASER, KAPLAMBRATIO No results found for: IGGSERUM, IGA, IGMSERUM No results found for: STEPHANY CARLOTA BENSON MARKEL EARLA JOANNIE DOC VICK, SPEI   Chemistry      Component Value Date/Time   NA 136 01/05/2024 1403   NA 133 (L) 02/16/2023 1341   K 4.4 01/05/2024 1403   CL 102 01/05/2024 1403   CO2 24 01/05/2024 1403   BUN 17 01/05/2024 1403   BUN 15 02/16/2023 1341   CREATININE 0.60 01/05/2024 1403   CREATININE 0.76 12/07/2019 1413   GLU 80 11/18/2019 0000      Component Value Date/Time   CALCIUM  9.0 01/05/2024 1403   ALKPHOS 90 01/05/2024 1403   AST 19 01/05/2024 1403   ALT 16 01/05/2024 1403   BILITOT <0.2 01/05/2024 1403       Impression and Plan: Angela Gibbs is a very pleasant 83 yo caucasian female with history of iron  deficiency anemia.  Epo level is 64. No ESA needed at this time.  Hgb 9.9, no blood transfusion needed at this time.  Iron  studies are pending. We will replace if needed.  Follow-up in another 3 weeks.   Lauraine Pepper, NP 12/23/20253:02 PM     [1]  Allergies Allergen Reactions   Cephalosporins  Shortness Of Breath and Other (See Comments)    Breathing difficulty   Ciprofloxacin Hives and Shortness Of Breath   Influenza Virus Vaccine Other (See Comments)    Numbness in legs   Keflex  [Cephalexin ] Hives   Latex Hives, Itching and Rash   Levaquin [Levofloxacin] Hives, Palpitations and Other (See Comments)    Chest pain   Nitrofuran Derivatives Hives, Itching and Swelling   Penicillins Hives   Sulfacetamide Sodium Hives   Tape Itching and Other (See Comments)    RED AND IRRITATED SKIN   Xopenex  [Levalbuterol  Hcl] Anxiety and Other (See Comments)    Tremors with violent body chattering and shaking   Zostavax [Zoster Vaccine Live] Other (See Comments)    Pt has been told by specialists not to receive vaccine d/t h/o herpes in bloodstream and shingles.   Erythromycin Other (See Comments)    stomach issues   Hydrochlorothiazide  Other (See Comments)    Migraine   Lisinopril Cough   Petrolatum Nausea Only   Pregabalin Other (See Comments)   Ropinirole  Hcl Nausea And Vomiting   Shellfish Allergy Hives   Sulfa Antibiotics Hives   Golimumab  Other (See Comments)    Felt poorly (Simponi )   Ibuprofen Other (See Comments)    Stomach upset   Lactose Other (See Comments)    stomach issues  Other Reaction(s): GI Intolerance   Aspirin Other (See Comments)    Stomach pain/burning   Avelox [Moxifloxacin Hcl In Nacl] Nausea Only   Cefdinir Nausea And Vomiting   Chromium Other (See Comments)    Blisters   Egg Protein-Containing Drug Products     Per allergy testing, no known reaction. Pt states Angela Gibbs was told Angela Gibbs could probably eat 2-3 eggs weekly w/o issue.   Egg Solids, Whole Other (See Comments)    Per allergy testing, no known reaction. Pt states Angela Gibbs was told Angela Gibbs could probably eat 2-3 eggs weekly w/o issue.   Iodinated Contrast Media Other (See Comments)    unknown    Lactose Intolerance (Gi) Other (See Comments)    stomach issues   Percodan [Oxycodone -Aspirin] Nausea  And Vomiting   Prednisone  Palpitations  No injections, can do the pills   Troleandomycin Other (See Comments)    Unknown reaction   "

## 2024-02-10 LAB — FERRITIN: Ferritin: 361 ng/mL — ABNORMAL HIGH (ref 11–307)

## 2024-02-24 ENCOUNTER — Other Ambulatory Visit: Payer: Self-pay | Admitting: Family Medicine

## 2024-02-29 ENCOUNTER — Inpatient Hospital Stay

## 2024-02-29 ENCOUNTER — Inpatient Hospital Stay: Admitting: Family

## 2024-02-29 ENCOUNTER — Inpatient Hospital Stay: Admitting: Adult Health

## 2024-03-01 ENCOUNTER — Encounter: Payer: Self-pay | Admitting: Family

## 2024-03-01 ENCOUNTER — Other Ambulatory Visit: Payer: Self-pay

## 2024-03-01 ENCOUNTER — Inpatient Hospital Stay: Admitting: Family

## 2024-03-01 ENCOUNTER — Telehealth: Payer: Self-pay | Admitting: Family Medicine

## 2024-03-01 ENCOUNTER — Inpatient Hospital Stay: Attending: Hematology & Oncology

## 2024-03-01 VITALS — BP 164/89 | HR 55 | Temp 97.5°F | Resp 17 | Ht 63.0 in | Wt 183.0 lb

## 2024-03-01 DIAGNOSIS — R21 Rash and other nonspecific skin eruption: Secondary | ICD-10-CM | POA: Diagnosis not present

## 2024-03-01 DIAGNOSIS — R790 Abnormal level of blood mineral: Secondary | ICD-10-CM

## 2024-03-01 DIAGNOSIS — Z91041 Radiographic dye allergy status: Secondary | ICD-10-CM | POA: Diagnosis not present

## 2024-03-01 DIAGNOSIS — Z888 Allergy status to other drugs, medicaments and biological substances status: Secondary | ICD-10-CM | POA: Insufficient documentation

## 2024-03-01 DIAGNOSIS — Z91012 Allergy to eggs, unspecified: Secondary | ICD-10-CM | POA: Diagnosis not present

## 2024-03-01 DIAGNOSIS — Z885 Allergy status to narcotic agent status: Secondary | ICD-10-CM | POA: Diagnosis not present

## 2024-03-01 DIAGNOSIS — R5383 Other fatigue: Secondary | ICD-10-CM | POA: Diagnosis not present

## 2024-03-01 DIAGNOSIS — D500 Iron deficiency anemia secondary to blood loss (chronic): Secondary | ICD-10-CM | POA: Diagnosis not present

## 2024-03-01 DIAGNOSIS — Z91040 Latex allergy status: Secondary | ICD-10-CM | POA: Insufficient documentation

## 2024-03-01 DIAGNOSIS — Z886 Allergy status to analgesic agent status: Secondary | ICD-10-CM | POA: Diagnosis not present

## 2024-03-01 DIAGNOSIS — Z881 Allergy status to other antibiotic agents status: Secondary | ICD-10-CM | POA: Insufficient documentation

## 2024-03-01 DIAGNOSIS — D509 Iron deficiency anemia, unspecified: Secondary | ICD-10-CM | POA: Insufficient documentation

## 2024-03-01 DIAGNOSIS — R0602 Shortness of breath: Secondary | ICD-10-CM | POA: Insufficient documentation

## 2024-03-01 DIAGNOSIS — Z882 Allergy status to sulfonamides status: Secondary | ICD-10-CM | POA: Insufficient documentation

## 2024-03-01 DIAGNOSIS — Z79899 Other long term (current) drug therapy: Secondary | ICD-10-CM | POA: Diagnosis not present

## 2024-03-01 DIAGNOSIS — Z887 Allergy status to serum and vaccine status: Secondary | ICD-10-CM | POA: Diagnosis not present

## 2024-03-01 DIAGNOSIS — D649 Anemia, unspecified: Secondary | ICD-10-CM

## 2024-03-01 DIAGNOSIS — Z880 Allergy status to penicillin: Secondary | ICD-10-CM | POA: Insufficient documentation

## 2024-03-01 LAB — CBC WITH DIFFERENTIAL (CANCER CENTER ONLY)
Abs Immature Granulocytes: 0.08 K/uL — ABNORMAL HIGH (ref 0.00–0.07)
Basophils Absolute: 0.1 K/uL (ref 0.0–0.1)
Basophils Relative: 1 %
Eosinophils Absolute: 0.2 K/uL (ref 0.0–0.5)
Eosinophils Relative: 2 %
HCT: 34.2 % — ABNORMAL LOW (ref 36.0–46.0)
Hemoglobin: 11.3 g/dL — ABNORMAL LOW (ref 12.0–15.0)
Immature Granulocytes: 1 %
Lymphocytes Relative: 14 %
Lymphs Abs: 1 K/uL (ref 0.7–4.0)
MCH: 30 pg (ref 26.0–34.0)
MCHC: 33 g/dL (ref 30.0–36.0)
MCV: 90.7 fL (ref 80.0–100.0)
Monocytes Absolute: 0.9 K/uL (ref 0.1–1.0)
Monocytes Relative: 12 %
Neutro Abs: 5 K/uL (ref 1.7–7.7)
Neutrophils Relative %: 70 %
Platelet Count: 257 K/uL (ref 150–400)
RBC: 3.77 MIL/uL — ABNORMAL LOW (ref 3.87–5.11)
RDW: 16.8 % — ABNORMAL HIGH (ref 11.5–15.5)
WBC Count: 7.1 K/uL (ref 4.0–10.5)
nRBC: 0 % (ref 0.0–0.2)

## 2024-03-01 LAB — CMP (CANCER CENTER ONLY)
ALT: 35 U/L (ref 0–44)
AST: 25 U/L (ref 15–41)
Albumin: 4.2 g/dL (ref 3.5–5.0)
Alkaline Phosphatase: 93 U/L (ref 38–126)
Anion gap: 10 (ref 5–15)
BUN: 18 mg/dL (ref 8–23)
CO2: 24 mmol/L (ref 22–32)
Calcium: 9.2 mg/dL (ref 8.9–10.3)
Chloride: 99 mmol/L (ref 98–111)
Creatinine: 0.76 mg/dL (ref 0.44–1.00)
GFR, Estimated: >60 mL/min
Glucose, Bld: 105 mg/dL — ABNORMAL HIGH (ref 70–99)
Potassium: 4 mmol/L (ref 3.5–5.1)
Sodium: 133 mmol/L — ABNORMAL LOW (ref 135–145)
Total Bilirubin: 0.3 mg/dL (ref 0.0–1.2)
Total Protein: 6.6 g/dL (ref 6.5–8.1)

## 2024-03-01 LAB — RETICULOCYTES
Immature Retic Fract: 6.7 % (ref 2.3–15.9)
RBC.: 3.79 MIL/uL — ABNORMAL LOW (ref 3.87–5.11)
Retic Count, Absolute: 61.4 K/uL (ref 19.0–186.0)
Retic Ct Pct: 1.6 % (ref 0.4–3.1)

## 2024-03-01 LAB — IRON AND IRON BINDING CAPACITY (CC-WL,HP ONLY)
Iron: 30 ug/dL (ref 28–170)
Saturation Ratios: 7 % — ABNORMAL LOW (ref 10.4–31.8)
TIBC: 407 ug/dL (ref 250–450)
UIBC: 377 ug/dL

## 2024-03-01 LAB — SAMPLE TO BLOOD BANK

## 2024-03-01 LAB — FERRITIN: Ferritin: 83 ng/mL (ref 11–307)

## 2024-03-01 NOTE — Telephone Encounter (Signed)
 Pts husband dropped off a form that the pt needs her pcp to fill out. She wants the form mailed when it is filled out. Form placed in pcps box.

## 2024-03-01 NOTE — Progress Notes (Signed)
 " Hematology and Oncology Follow Up Visit  ROSANGELA FEHRENBACH [id number] 1940/11/06 84 y.o. 03/01/2024   Principle Diagnosis:  Iron  deficiency anemia MDS panel was negative   Current Therapy:        IV iron  as indicated    Interim History:  Ms. Schmuck is here today for follow-up with her husband. She is currently on oral and topical steroids for an itchy rash on her arms. She states that this is slowly improving.  She has not noted any obvious blood loss.  She has fatigue and SOB with over exertion unchanged from baseline.   No fever, chills, n/v, cough, dizziness, chest pain, palpitations, abdominal pain or changes in bowel or bladder habits.  No swelling, tenderness, numbness or tingling in her extremities.  No falls or syncope reported.  Appetite and hydration are good. Weight is stable at 183 lbs.   ECOG Performance Status: 1 - Symptomatic but completely ambulatory  Medications:  Allergies as of 03/01/2024       Reactions   Cephalosporins Shortness Of Breath, Other (See Comments)   Breathing difficulty   Ciprofloxacin Hives, Shortness Of Breath   Influenza Virus Vaccine Other (See Comments)   Numbness in legs   Keflex  [cephalexin ] Hives   Latex Hives, Itching, Rash   Levaquin [levofloxacin] Hives, Palpitations, Other (See Comments)   Chest pain   Nitrofuran Derivatives Hives, Itching, Swelling   Penicillins Hives   Sulfacetamide Sodium Hives   Tape Itching, Other (See Comments)   RED AND IRRITATED SKIN   Xopenex  [levalbuterol  Hcl] Anxiety, Other (See Comments)   Tremors with violent body chattering and shaking   Zostavax [zoster Vaccine Live] Other (See Comments)   Pt has been told by specialists not to receive vaccine d/t h/o herpes in bloodstream and shingles.   Erythromycin Other (See Comments)   stomach issues   Hydrochlorothiazide  Other (See Comments)   Migraine   Lisinopril Cough   Petrolatum Nausea Only   Pregabalin Other (See Comments)   Ropinirole  Hcl  Nausea And Vomiting   Shellfish Allergy Hives   Sulfa Antibiotics Hives   Golimumab  Other (See Comments)   Felt poorly (Simponi )   Ibuprofen Other (See Comments)   Stomach upset   Lactose Other (See Comments)   stomach issues Other Reaction(s): GI Intolerance   Aspirin Other (See Comments)   Stomach pain/burning   Avelox [moxifloxacin Hcl In Nacl] Nausea Only   Cefdinir Nausea And Vomiting   Chromium Other (See Comments)   Blisters   Egg Protein-containing Drug Products    Per allergy testing, no known reaction. Pt states she was told she could probably eat 2-3 eggs weekly w/o issue.   Egg Solids, Whole Other (See Comments)   Per allergy testing, no known reaction. Pt states she was told she could probably eat 2-3 eggs weekly w/o issue.   Iodinated Contrast Media Other (See Comments)   unknown   Lactose Intolerance (gi) Other (See Comments)   stomach issues   Percodan [oxycodone -aspirin] Nausea And Vomiting   Prednisone  Palpitations   No injections, can do the pills   Troleandomycin Other (See Comments)   Unknown reaction        Medication List        Accurate as of March 01, 2024  3:10 PM. If you have any questions, ask your nurse or doctor.          ALPRAZolam  0.25 MG tablet Commonly known as: XANAX  TAKE 1 TABLET(0.25 MG) BY MOUTH TWICE DAILY AS  NEEDED FOR ANXIETY   atorvastatin  10 MG tablet Commonly known as: LIPITOR Take 1 tablet (10 mg total) by mouth daily.   butalbital-acetaminophen -caffeine 50-325-40 MG tablet Commonly known as: FIORICET Take 1-2 tablets by mouth 2 (two) times daily as needed for headache (or leg pain).   cyclobenzaprine 5 MG tablet Commonly known as: FLEXERIL Take 5 mg by mouth at bedtime.   FLUoxetine  20 MG capsule Commonly known as: PROZAC  Take 1 capsule (20 mg total) by mouth daily.   ibuprofen 200 MG tablet Commonly known as: ADVIL Take 200 mg by mouth every 6 (six) hours as needed.   lidocaine  5 % Commonly  known as: LIDODERM  2 patches daily.   montelukast  10 MG tablet Commonly known as: SINGULAIR  TAKE 1 TABLET(10 MG) BY MOUTH AT BEDTIME   nebivolol  10 MG tablet Commonly known as: BYSTOLIC  Take 1 tablet (10 mg total) by mouth daily.   NP Thyroid  15 MG tablet Generic drug: thyroid  Take 45 mg by mouth every morning.   Prednicarbate 0.1 % Crea Apply 1 Application topically as needed for itching (affected sites).   Refresh Tears PF 0.5-0.9 % Soln Generic drug: Carboxymethylcell-Glycerin PF Place 1 drop into both eyes 3 (three) times daily as needed (for irritation).   sodium chloride  0.65 % nasal spray Commonly known as: V-R NASAL SPRAY SALINE Place 1 spray into the nose as needed. Use in both nostrils twice daily   spironolactone  25 MG tablet Commonly known as: ALDACTONE  TAKE 1/2 TABLET(12.5 MG) BY MOUTH DAILY   traMADol  50 MG tablet Commonly known as: ULTRAM  Take 1 tablet (50 mg total) by mouth every 6 (six) hours as needed for moderate pain (pain score 4-6) or severe pain (pain score 7-10).   triamcinolone  ointment 0.5 % Commonly known as: KENALOG  Apply 1 Application topically 2 (two) times daily.   valsartan  320 MG tablet Commonly known as: DIOVAN  TAKE 1 TABLET(320 MG) BY MOUTH DAILY   vitamin C 1000 MG tablet Take 1,000 mg by mouth daily.   Vitamin D3 125 MCG (5000 UT) Caps Take 5,000 Units by mouth daily.        Allergies: Allergies[1]  Past Medical History, Surgical history, Social history, and Family History were reviewed and updated.  Review of Systems: All other 10 point review of systems is negative.   Physical Exam:  height is 5' 3 (1.6 m) and weight is 183 lb (83 kg). Her oral temperature is 97.5 F (36.4 C) (abnormal). Her blood pressure is 164/89 (abnormal) and her pulse is 55 (abnormal). Her respiration is 17 and oxygen saturation is 96%.   Wt Readings from Last 3 Encounters:  03/01/24 183 lb (83 kg)  02/09/24 182 lb (82.6 kg)  02/01/24 180  lb (81.6 kg)    Ocular: Sclerae unicteric, pupils equal, round and reactive to light Ear-nose-throat: Oropharynx clear, dentition fair Lymphatic: No cervical or supraclavicular adenopathy Lungs no rales or rhonchi, good excursion bilaterally Heart regular rate and rhythm, no murmur appreciated Abd soft, nontender, positive bowel sounds MSK no focal spinal tenderness, no joint edema Neuro: non-focal, well-oriented, appropriate affect Breasts: Deferred   Lab Results  Component Value Date   WBC 7.1 03/01/2024   HGB 11.3 (L) 03/01/2024   HCT 34.2 (L) 03/01/2024   MCV 90.7 03/01/2024   PLT 257 03/01/2024   Lab Results  Component Value Date   FERRITIN 361 (H) 02/09/2024   IRON  67 02/09/2024   TIBC 347 02/09/2024   UIBC 280 02/09/2024   IRONPCTSAT 19  02/09/2024   Lab Results  Component Value Date   RETICCTPCT 1.6 03/01/2024   RBC 3.79 (L) 03/01/2024   No results found for: KPAFRELGTCHN, LAMBDASER, KAPLAMBRATIO No results found for: IGGSERUM, IGA, IGMSERUM No results found for: STEPHANY CARLOTA BENSON MARKEL EARLA JOANNIE DOC, MSPIKE, SPEI   Chemistry      Component Value Date/Time   NA 133 (L) 03/01/2024 1415   NA 133 (L) 02/16/2023 1341   K 4.0 03/01/2024 1415   CL 99 03/01/2024 1415   CO2 24 03/01/2024 1415   BUN 18 03/01/2024 1415   BUN 15 02/16/2023 1341   CREATININE 0.76 03/01/2024 1415   CREATININE 0.76 12/07/2019 1413   GLU 80 11/18/2019 0000      Component Value Date/Time   CALCIUM  9.2 03/01/2024 1415   ALKPHOS 93 03/01/2024 1415   AST 25 03/01/2024 1415   ALT 35 03/01/2024 1415   BILITOT 0.3 03/01/2024 1415       Impression and Plan: Ms. Linford is a very pleasant 84 yo caucasian female with history of iron  deficiency anemia.  Epo level is 64. No ESA needed at this time.  Hgb 11.3, no blood transfusion needed at this time.  Iron  studies are pending. We will replace if needed.  Follow-up in another 4 weeks.    Lauraine Pepper, NP 1/13/20263:10 PM     [1]  Allergies Allergen Reactions   Cephalosporins Shortness Of Breath and Other (See Comments)    Breathing difficulty   Ciprofloxacin Hives and Shortness Of Breath   Influenza Virus Vaccine Other (See Comments)    Numbness in legs   Keflex  [Cephalexin ] Hives   Latex Hives, Itching and Rash   Levaquin [Levofloxacin] Hives, Palpitations and Other (See Comments)    Chest pain   Nitrofuran Derivatives Hives, Itching and Swelling   Penicillins Hives   Sulfacetamide Sodium Hives   Tape Itching and Other (See Comments)    RED AND IRRITATED SKIN   Xopenex  [Levalbuterol  Hcl] Anxiety and Other (See Comments)    Tremors with violent body chattering and shaking   Zostavax [Zoster Vaccine Live] Other (See Comments)    Pt has been told by specialists not to receive vaccine d/t h/o herpes in bloodstream and shingles.   Erythromycin Other (See Comments)    stomach issues   Hydrochlorothiazide  Other (See Comments)    Migraine   Lisinopril Cough   Petrolatum Nausea Only   Pregabalin Other (See Comments)   Ropinirole  Hcl Nausea And Vomiting   Shellfish Allergy Hives   Sulfa Antibiotics Hives   Golimumab  Other (See Comments)    Felt poorly (Simponi )   Ibuprofen Other (See Comments)    Stomach upset   Lactose Other (See Comments)    stomach issues  Other Reaction(s): GI Intolerance   Aspirin Other (See Comments)    Stomach pain/burning   Avelox [Moxifloxacin Hcl In Nacl] Nausea Only   Cefdinir Nausea And Vomiting   Chromium Other (See Comments)    Blisters   Egg Protein-Containing Drug Products     Per allergy testing, no known reaction. Pt states she was told she could probably eat 2-3 eggs weekly w/o issue.   Egg Solids, Whole Other (See Comments)    Per allergy testing, no known reaction. Pt states she was told she could probably eat 2-3 eggs weekly w/o issue.   Iodinated Contrast Media Other (See Comments)    unknown    Lactose  Intolerance (Gi) Other (See Comments)    stomach issues  Percodan [Oxycodone -Aspirin] Nausea And Vomiting   Prednisone  Palpitations    No injections, can do the pills   Troleandomycin Other (See Comments)    Unknown reaction   "

## 2024-03-01 NOTE — Telephone Encounter (Signed)
 Handicap placard form placed in folder for provider to complete

## 2024-03-02 ENCOUNTER — Encounter: Payer: Self-pay | Admitting: Family

## 2024-03-07 ENCOUNTER — Telehealth: Payer: Self-pay | Admitting: Family

## 2024-03-07 NOTE — Telephone Encounter (Signed)
 Called to schedule infusion per inbasket. LVM to return call for scheduling.

## 2024-03-10 ENCOUNTER — Inpatient Hospital Stay

## 2024-03-10 VITALS — BP 133/55 | HR 51 | Resp 18 | Wt 183.0 lb

## 2024-03-10 DIAGNOSIS — D500 Iron deficiency anemia secondary to blood loss (chronic): Secondary | ICD-10-CM

## 2024-03-10 DIAGNOSIS — D509 Iron deficiency anemia, unspecified: Secondary | ICD-10-CM | POA: Diagnosis not present

## 2024-03-10 MED ORDER — SODIUM CHLORIDE 0.9 % IV SOLN: Freq: Once | INTRAVENOUS | Status: AC

## 2024-03-10 MED ORDER — SODIUM CHLORIDE 0.9 % IV SOLN
510.0000 mg | Freq: Once | INTRAVENOUS | Status: AC
  Administered 2024-03-10: 510 mg via INTRAVENOUS
  Filled 2024-03-10: qty 17

## 2024-03-10 NOTE — Patient Instructions (Signed)
 Ferumoxytol  Injection What is this medication? FERUMOXYTOL  (FER ue MOX i tol) treats low levels of iron  in your body (iron  deficiency anemia). Iron  is a mineral that plays an important role in making red blood cells, which carry oxygen from your lungs to the rest of your body. This medicine may be used for other purposes; ask your health care provider or pharmacist if you have questions. COMMON BRAND NAME(S): Feraheme  What should I tell my care team before I take this medication? They need to know if you have any of these conditions: Anemia not caused by low iron  levels High levels of iron  in the blood Magnetic resonance imaging (MRI) test scheduled An unusual or allergic reaction to iron , other medications, foods, dyes, or preservatives Pregnant or trying to get pregnant Breastfeeding How should I use this medication? This medication is injected into a vein. It is given by your care team in a hospital or clinic setting. Talk to your care team the use of this medication in children. Special care may be needed. Overdosage: If you think you have taken too much of this medicine contact a poison control center or emergency room at once. NOTE: This medicine is only for you. Do not share this medicine with others. What if I miss a dose? It is important not to miss your dose. Call your care team if you are unable to keep an appointment. What may interact with this medication? Other iron  products This list may not describe all possible interactions. Give your health care provider a list of all the medicines, herbs, non-prescription drugs, or dietary supplements you use. Also tell them if you smoke, drink alcohol, or use illegal drugs. Some items may interact with your medicine. What should I watch for while using this medication? Visit your care team for regular checks on your progress. Tell your care team if your symptoms do not start to get better or if they get worse. You may need blood work done  while you are taking this medication. You may need to eat more foods that contain iron . Talk to your care team. Foods that contain iron  include whole grains or cereals, dried fruits, beans, peas, leafy green vegetables, and organ meats (liver, kidney). What side effects may I notice from receiving this medication? Side effects that you should report to your care team as soon as possible: Allergic reactions--skin rash, itching, hives, swelling of the face, lips, tongue, or throat Low blood pressure--dizziness, feeling faint or lightheaded, blurry vision Shortness of breath Side effects that usually do not require medical attention (report to your care team if they continue or are bothersome): Flushing Headache Joint pain Muscle pain Nausea Pain, redness, or irritation at injection site This list may not describe all possible side effects. Call your doctor for medical advice about side effects. You may report side effects to FDA at 1-800-FDA-1088. Where should I keep my medication? This medication is given in a hospital or clinic. It will not be stored at home. NOTE: This sheet is a summary. It may not cover all possible information. If you have questions about this medicine, talk to your doctor, pharmacist, or health care provider.  2024 Elsevier/Gold Standard (2022-09-24 00:00:00)Blood Transfusion, Adult, Care After After a blood transfusion, it is common to have: Bruising and soreness at the IV site. A headache. Follow these instructions at home: Your doctor may give you more instructions. If you have problems, contact your doctor. Insertion site care     Follow instructions from your  doctor about how to take care of your insertion site. This is where an IV tube was put into your vein. Make sure you: Wash your hands with soap and water for at least 20 seconds before and after you change your bandage. If you cannot use soap and water, use hand sanitizer. Change your bandage as told by  your doctor. Check your insertion site every day for signs of infection. Check for: Redness, swelling, or pain. Bleeding from the site. Warmth. Pus or a bad smell. General instructions Take over-the-counter and prescription medicines only as told by your doctor. Rest as told by your doctor. Go back to your normal activities as told by your doctor. Keep all follow-up visits. You may need to have tests at certain times to check your blood. Contact a doctor if: You have itching or red, swollen areas of skin (hives). You have a fever or chills. You have pain in the head, back, or chest. You feel worried or nervous (anxious). You feel weak after doing your normal activities. You have any of these problems at the insertion site: Redness, swelling, warmth, or pain. Bleeding that does not stop with pressure. Pus or a bad smell. If you received your blood transfusion in an outpatient setting, you will be told whom to contact to report any reactions. Get help right away if: You have signs of a serious reaction. This may be coming from an allergy or the body's defense system (immune system). Signs include: Trouble breathing or shortness of breath. Swelling of the face or feeling warm (flushed). A widespread rash. Dark pee (urine) or blood in the pee. Fast heartbeat. These symptoms may be an emergency. Get help right away. Call 911. Do not wait to see if the symptoms will go away. Do not drive yourself to the hospital. Summary Bruising and soreness at the IV site are common. Check your insertion site every day for signs of infection. Rest as told by your doctor. Go back to your normal activities as told by your doctor. Get help right away if you have signs of a serious reaction. This information is not intended to replace advice given to you by your health care provider. Make sure you discuss any questions you have with your health care provider. Document Revised: 05/03/2021 Document  Reviewed: 05/03/2021 Elsevier Patient Education  2024 [employer].

## 2024-03-11 ENCOUNTER — Encounter: Payer: Self-pay | Admitting: Family

## 2024-03-11 ENCOUNTER — Telehealth: Payer: Self-pay

## 2024-03-11 NOTE — Telephone Encounter (Signed)
 Spoke with pts husband Zachary, informed Zachary form was completed by PCP and placed up front 03/03/2024 to be mailed out to pt as requested. Pts husband states they still have not received the copy in the mail and are requesting a new one be filled out and again mailed to pt. Myles Zachary PCP is not in office today however a message would be sent back to PCP. Please advise it is okay to fill out a new form and have DOD sign the form? If so how long do we write it for and for what condition?

## 2024-03-11 NOTE — Telephone Encounter (Signed)
 Copied from CRM [id number]. Topic: General - Other >> Mar 11, 2024 12:44 PM Viola F wrote: Reason for CRM: Patient spouse Zachary called to follow up on renewal for handicap sticker - it expires in 4 days and he would like a call back today with an update at [phone]

## 2024-03-17 ENCOUNTER — Inpatient Hospital Stay

## 2024-03-17 VITALS — BP 144/71 | HR 56 | Resp 18

## 2024-03-17 DIAGNOSIS — D500 Iron deficiency anemia secondary to blood loss (chronic): Secondary | ICD-10-CM

## 2024-03-17 DIAGNOSIS — D509 Iron deficiency anemia, unspecified: Secondary | ICD-10-CM | POA: Diagnosis not present

## 2024-03-17 MED ORDER — SODIUM CHLORIDE 0.9 % IV SOLN: Freq: Once | INTRAVENOUS | Status: AC

## 2024-03-17 MED ORDER — SODIUM CHLORIDE 0.9 % IV SOLN
510.0000 mg | Freq: Once | INTRAVENOUS | Status: AC
  Administered 2024-03-17: 510 mg via INTRAVENOUS
  Filled 2024-03-17: qty 17

## 2024-03-17 NOTE — Telephone Encounter (Signed)
 Form has been signed by PCP and placed up front to mail to pt as requested.

## 2024-03-17 NOTE — Patient Instructions (Signed)

## 2024-03-29 ENCOUNTER — Inpatient Hospital Stay: Admitting: Family

## 2024-03-29 ENCOUNTER — Inpatient Hospital Stay: Attending: Hematology & Oncology

## 2024-05-19 ENCOUNTER — Ambulatory Visit: Admitting: Family Medicine

## 2025-02-02 ENCOUNTER — Ambulatory Visit
